# Patient Record
Sex: Male | Born: 1935
Health system: Southern US, Community
[De-identification: ages and names within clinical notes are randomized; demographics above are authoritative.]

## PROBLEM LIST (undated history)

## (undated) DIAGNOSIS — I639 Cerebral infarction, unspecified: Secondary | ICD-10-CM

## (undated) DIAGNOSIS — D649 Anemia, unspecified: Secondary | ICD-10-CM

## (undated) DIAGNOSIS — E785 Hyperlipidemia, unspecified: Secondary | ICD-10-CM

## (undated) DIAGNOSIS — Z8719 Personal history of other diseases of the digestive system: Secondary | ICD-10-CM

## (undated) DIAGNOSIS — I10 Essential (primary) hypertension: Secondary | ICD-10-CM

## (undated) DIAGNOSIS — R252 Cramp and spasm: Secondary | ICD-10-CM

## (undated) DIAGNOSIS — I351 Nonrheumatic aortic (valve) insufficiency: Secondary | ICD-10-CM

## (undated) HISTORY — DX: Hyperlipidemia, unspecified: E78.5

## (undated) HISTORY — DX: Personal history of other diseases of the digestive system: Z87.19

## (undated) HISTORY — DX: Cramp and spasm: R25.2

## (undated) HISTORY — DX: Essential (primary) hypertension: I10

## (undated) HISTORY — DX: Cerebral infarction, unspecified: I63.9

## (undated) HISTORY — DX: Nonrheumatic aortic (valve) insufficiency: I35.1

## (undated) HISTORY — DX: Anemia, unspecified: D64.9

---

## 2004-09-30 ENCOUNTER — Ambulatory Visit: Payer: Self-pay | Admitting: Gastroenterology

## 2004-10-12 ENCOUNTER — Ambulatory Visit: Payer: Self-pay | Admitting: Gastroenterology

## 2005-09-04 HISTORY — PX: CARDIOVASCULAR STRESS TEST: SHX262

## 2006-03-20 ENCOUNTER — Emergency Department (HOSPITAL_COMMUNITY): Admission: EM | Admit: 2006-03-20 | Discharge: 2006-03-20 | Payer: Self-pay | Admitting: Family Medicine

## 2007-05-27 ENCOUNTER — Ambulatory Visit: Payer: Self-pay | Admitting: Gastroenterology

## 2007-05-27 LAB — CONVERTED CEMR LAB
Folate: 8.2 ng/mL
H Pylori IgG: NEGATIVE
Iron: 73 ug/dL (ref 42–165)
Saturation Ratios: 24.1 % (ref 20.0–50.0)
Transferrin: 216.1 mg/dL (ref >=212.0)
Vitamin B-12: 349 pg/mL (ref 211–911)

## 2007-05-28 ENCOUNTER — Ambulatory Visit: Payer: Self-pay | Admitting: Gastroenterology

## 2007-05-28 ENCOUNTER — Encounter: Payer: Self-pay | Admitting: Gastroenterology

## 2007-05-28 DIAGNOSIS — K2970 Gastritis, unspecified, without bleeding: Secondary | ICD-10-CM | POA: Insufficient documentation

## 2007-05-28 DIAGNOSIS — K2980 Duodenitis without bleeding: Secondary | ICD-10-CM | POA: Insufficient documentation

## 2007-05-28 DIAGNOSIS — K2990 Gastroduodenitis, unspecified, without bleeding: Secondary | ICD-10-CM

## 2007-05-28 DIAGNOSIS — K648 Other hemorrhoids: Secondary | ICD-10-CM | POA: Insufficient documentation

## 2007-07-25 DIAGNOSIS — K219 Gastro-esophageal reflux disease without esophagitis: Secondary | ICD-10-CM | POA: Insufficient documentation

## 2007-07-25 DIAGNOSIS — K449 Diaphragmatic hernia without obstruction or gangrene: Secondary | ICD-10-CM | POA: Insufficient documentation

## 2007-07-25 DIAGNOSIS — D649 Anemia, unspecified: Secondary | ICD-10-CM | POA: Insufficient documentation

## 2007-08-21 ENCOUNTER — Inpatient Hospital Stay (HOSPITAL_BASED_OUTPATIENT_CLINIC_OR_DEPARTMENT_OTHER): Admission: RE | Admit: 2007-08-21 | Discharge: 2007-08-21 | Payer: Self-pay | Admitting: Cardiovascular Disease

## 2007-08-23 ENCOUNTER — Ambulatory Visit (HOSPITAL_COMMUNITY): Admission: RE | Admit: 2007-08-23 | Discharge: 2007-08-23 | Payer: Self-pay | Admitting: Cardiovascular Disease

## 2007-11-26 HISTORY — PX: US ECHOCARDIOGRAPHY: HXRAD669

## 2009-04-19 DIAGNOSIS — I719 Aortic aneurysm of unspecified site, without rupture: Secondary | ICD-10-CM | POA: Insufficient documentation

## 2009-04-19 DIAGNOSIS — D509 Iron deficiency anemia, unspecified: Secondary | ICD-10-CM | POA: Insufficient documentation

## 2009-04-19 DIAGNOSIS — E785 Hyperlipidemia, unspecified: Secondary | ICD-10-CM | POA: Insufficient documentation

## 2010-11-15 NOTE — Cardiovascular Report (Signed)
NAMELAVERE, STORK NO.:  [id number]   MEDICAL RECORD NO.:  [id number]          PATIENT TYPE:  OIB   LOCATION:  1963                         FACILITY:  [facility]   PHYSICIAN:  Vesta Mixer, M.D. DATE OF BIRTH:  01/07/1927   DATE OF PROCEDURE:  08/21/2007  DATE OF DISCHARGE:  08/21/2007                            CARDIAC CATHETERIZATION   Stephen Flynn is a 77 year old gentleman with a longstanding history  with hypertension.  He presented, yesterday, with 2 weeks of chest pain  and chest pressure.  He has had a stress Cardiolite study that was  negative for ischemia in the past.  He presented, yesterday, with 2  weeks of chest tightness.  He was referred for heart catheterization for  further evaluation.   PROCEDURE:  Left heart catheterization with coronary angiography with a  left ventriculogram and an aortic root aortography.   The right femoral artery was easily cannulated using a modified  Seldinger technique.  Because of the tortuosity of the aorta all the  catheters were exchanged over a guidewire.   HEMODYNAMIC RESULTS:  LV pressure was 122/13 with an aortic pressure of  122/77.   On manipulating the catheters, it became apparent that the [name]  had an extremely large aortic arch.  The left main was cannulated using  a using Judkins left thigh catheter.  The right coronary artery was best  visualized use a 5-French Amplatz left one catheter.  To perform the  aortic root shot we sized up to a 6-French catheter and used a 6-French  angulated pigtail.   CORONARY ANGIOGRAPHY:  1. The left main is smooth and normal.  2. The left anterior descending artery is fairly normal.  There is a      large first diagonal branch which is normal.  3. The left circumflex artery is very large vessel.  It is smooth and      normal.  The obtuse marginal artery #1 and the obtuse marginal      artery #2 were both normal.  The posterolateral segment artery is  normal.  4. Right coronary artery has a very anterior takeoff.  It was best      visualized using an Amplatz left one catheter.  There is a      shepherd's crook proximally; but, otherwise, the right coronary      artery is unremarkable.   LEFT VENTRICULOGRAM:  Performed in the 30-RAO position.  It reveals a  mildly dilated left ventricle.  The overall left ventricular systolic  function is normal.  The ejection fraction was around 55%.   Aortic root shot was performed using the 6-French catheter.  It reveals  a markedly dilated aortic root and aortic arch.  There is no air aortic  insufficiency.  There appears to be some aneurysmal segments within the  aortic arch.   COMPLICATIONS:  None.   CONCLUSIONS:  1. Normal coronary arteries.  2. Normal left ventricular systolic function.  3. Moderate-to-severe dilatation of the aortic root and aortic arch.      We will arrange to have a CT scan  in the next day or so.  This      could be the cause of his chest pain.  I do not see any evidence of      dissection.           ______________________________  Vesta Mixer, M.D.     PJN/MEDQ  D:  08/21/2007  T:  08/22/2007  Job:  [id number]   cc:   Gwen Pounds, MD

## 2010-11-15 NOTE — H&P (Signed)
Stephen Flynn, Stephen Flynn NO.:  [id number]   MEDICAL RECORD NO.:  [id number]          PATIENT TYPE:  OIB   LOCATION:  NA                           FACILITY:  [facility]   PHYSICIAN:  Stephen Flynn, M.D. DATE OF BIRTH:  01/07/1927   DATE OF ADMISSION:  DATE OF DISCHARGE:                              HISTORY & PHYSICAL   REASON FOR REFERRAL:  Stephen Flynn is an elderly gentleman with a  history of hypertension and chest pain.  He is referred to our office  this afternoon for recurrent chest pains.  He is now admitted for heart  catheterization because of symptoms consistent with unstable angina.   HISTORY:  Stephen Flynn is a 77 year old gentleman who has had history  of chest pain in the past.  He has had a negative stress Cardiolite  study here at our office.  For the past 2 weeks, he has had episodes of  chest pain.  This chest pain occurs at various times.  It is not  necessarily associated with eating, drinking, change in position, taking  a deep breath or exercise.  He has had some vague underlying chest pain  for the past several months.  Describes it as a heaviness or a chest  ache.  It is slow in severity and perhaps 2/10.  It lasts for 5-10  minutes and then resolves only to reoccur again several hours later.  This past weekend, he had a severe episode of pain just after eating.  The patient denies any PND or orthopnea.  He denies any syncope or  presyncope.   CURRENT MEDICATIONS:  1. Benicar/HCT 40/25 mg once a day.  2. Norvasc 5 mg a day.   ALLERGIES:  PENICILLIN.   PAST MEDICAL HISTORY:  1. Hypertension.  2. History of GI bleed with anemia.  3. History of ankle fracture.   SOCIAL HISTORY:  The patient used to smoke, but quit 35 years ago.  He  does not drink alcohol.   FAMILY HISTORY:  His father died at age 8 due to old age.  His mother  died at age 57 due to old age.   REVIEW OF SYSTEMS:  Reviewed and is essentially negative except as noted  in the HPI.   PHYSICAL EXAMINATION:  GENERAL:  He is an elderly gentleman in no acute  distress.  He is alert and oriented x3. His mood and affect are normal.  VITAL SIGNS:  His weight is 250, blood pressure is 140/90 a with heart  rate of 68.  HEENT:  Reveals 2+ carotids.  He has no bruits, no JVD, no thyromegaly.  LUNGS:  Clear to auscultation.  HEART:  Regular rate, S1-S2.  He has no murmurs.  ABDOMEN:  Reveals good bowel sounds, nontender.  EXTREMITIES:  He has no clubbing, cyanosis or edema.  NEURO:  Nonfocal.   DIAGNOSTICS:  His EKG from Dr. Ferd Flynn office reveals normal sinus  rhythm.  He has occasional premature ventricular contractions.  There  are no acute ST/T wave changes.   LABORATORY DATA:  Pending.   ASSESSMENT:  [name] presents  with symptoms that are worrisome for  unstable angina.  He has had diffuse chest achiness and dullness for the  past several weeks.  He has had a negative stress Cardiolite study 2  years ago, but I agree with Dr. Timothy Flynn that his symptoms are somewhat  worrisome.  We have discussed the risks, benefits and options of heart  catheterization.  He understands and agrees to proceed.           ______________________________  Stephen Flynn, M.D.     PJN/MEDQ  D:  08/20/2007  T:  08/21/2007  Job:  [id number]   cc:   Stephen Pounds, MD

## 2010-11-15 NOTE — Assessment & Plan Note (Signed)
[facility]                         GASTROENTEROLOGY OFFICE NOTE   NAME:Stephen Flynn, Stephen Flynn                        MRN:          [id number]  DATE:05/27/2007                            DOB:          01/07/1927    REFERRING PHYSICIAN:  Gwen Pounds, MD   REASON FOR CONSULT:  Hemoccult-positive stool and anemia.   HISTORY OF PRESENT ILLNESS:  Mr. Hengst is a 77 year old African-  American male that I have previously evaluated.  He underwent  colonoscopy in April 2006 for a family history of colon polyps.  Small  internal hemorrhoids were noted.  The remainder of the exam was normal.  He has no ongoing gastrointestinal complaints, and specifically denies  any abdominal pain, rectal pain, weight loss, change in bowel habits,  change in stool caliber, melena, hematochezia, dyspepsia, reflux  symptoms, nausea, vomiting, dysphagia or odynophagia.  He was recently  found to have a normocytic anemia with a hemoglobin of 11.8, and an MCV  of 89.  Hemoccult-positive stool was noted on digital rectal  examination, and was negative on screening home hemoccult.   PAST MEDICAL HISTORY:  1. Hypertension.  2. Hyperlipidemia.  3. Benign prostatic hypertrophy.  4. Hemorrhoids.   CURRENT MEDICATIONS:  1. Benicar 40/2.5 one q. day.  2. Norvasc 5 mg q. day.   MEDICATION ALLERGIES:  None known.   SOCIAL HISTORY/REVIEW OF SYSTEMS:  Per the handwritten form.   PHYSICAL EXAM:  Overweight, no acute distress.  Height 5 feet 11 inches, weight 250.2 pounds, blood pressure 100/70,  pulse 78 and regular.  HEENT:  Anicteric sclerae.  Oropharynx clear.  CHEST:  Clear to auscultation bilaterally.  CARDIAC:  Regular rate and rhythm without murmurs appreciated.  ABDOMEN:  Soft, nontender, nondistended.  Normoactive bowel sounds.  Nonpalpable organomegaly, masses or hernias.  RECTAL EXAMINATION:  Deferred at the time of colonoscopy.  Recent exam  was unremarkable except for  hemoccult-positive stool.  EXTREMITIES:  Without clubbing, cyanosis, edema.  NEUROLOGIC:  Alert and oriented x3.  Grossly nonfocal.   ASSESSMENT AND PLAN:  Hemoccult-positive stool and a normocytic anemia.  Obtain all standard blood tests for evaluation of anemia.  Rule out  colorectal neoplasms, arteriovenous malformations, ulcer disease,  gastritis and other disorders.  Risks, benefits, and alternatives to  colonoscopy with possible biopsy and possible polypectomy and upper  endoscopy with possible biopsy discussed with the patient, and he  consents to proceed.  The procedures will be scheduled electively.     Venita Lick. Russella Dar, MD, [facility]  Electronically Signed    MTS/MedQ  DD: 05/27/2007  DT: 05/27/2007  Job #: [id number]   cc:   Gwen Pounds, MD

## 2011-06-26 ENCOUNTER — Other Ambulatory Visit (HOSPITAL_COMMUNITY): Payer: Self-pay | Admitting: Internal Medicine

## 2011-06-26 DIAGNOSIS — I719 Aortic aneurysm of unspecified site, without rupture: Secondary | ICD-10-CM

## 2011-06-28 ENCOUNTER — Ambulatory Visit (HOSPITAL_COMMUNITY): Payer: Medicare PPO | Attending: Cardiovascular Disease | Admitting: Radiology

## 2011-06-28 DIAGNOSIS — I359 Nonrheumatic aortic valve disorder, unspecified: Secondary | ICD-10-CM | POA: Insufficient documentation

## 2011-06-28 DIAGNOSIS — I059 Rheumatic mitral valve disease, unspecified: Secondary | ICD-10-CM | POA: Insufficient documentation

## 2011-06-28 DIAGNOSIS — I10 Essential (primary) hypertension: Secondary | ICD-10-CM | POA: Insufficient documentation

## 2011-06-28 DIAGNOSIS — I719 Aortic aneurysm of unspecified site, without rupture: Secondary | ICD-10-CM | POA: Insufficient documentation

## 2011-06-29 ENCOUNTER — Encounter (HOSPITAL_COMMUNITY): Payer: Self-pay | Admitting: Internal Medicine

## 2011-11-07 ENCOUNTER — Other Ambulatory Visit: Payer: Self-pay | Admitting: *Deleted

## 2011-11-07 ENCOUNTER — Encounter: Payer: Self-pay | Admitting: *Deleted

## 2012-04-11 DIAGNOSIS — Z2839 Other underimmunization status: Secondary | ICD-10-CM | POA: Insufficient documentation

## 2012-07-11 DIAGNOSIS — L723 Sebaceous cyst: Secondary | ICD-10-CM | POA: Insufficient documentation

## 2013-01-13 DIAGNOSIS — E119 Type 2 diabetes mellitus without complications: Secondary | ICD-10-CM | POA: Insufficient documentation

## 2013-01-20 ENCOUNTER — Other Ambulatory Visit: Payer: Self-pay | Admitting: Internal Medicine

## 2013-01-20 DIAGNOSIS — D649 Anemia, unspecified: Secondary | ICD-10-CM

## 2013-01-27 ENCOUNTER — Other Ambulatory Visit: Payer: 59

## 2013-01-29 ENCOUNTER — Ambulatory Visit
Admission: RE | Admit: 2013-01-29 | Discharge: 2013-01-29 | Disposition: A | Discharge status: Home / Self Care | Payer: Medicare Other | Source: Ambulatory Visit | Attending: Internal Medicine | Admitting: Internal Medicine

## 2013-01-29 DIAGNOSIS — D649 Anemia, unspecified: Secondary | ICD-10-CM

## 2013-07-30 ENCOUNTER — Encounter: Payer: Self-pay | Admitting: Cardiology

## 2013-07-30 ENCOUNTER — Ambulatory Visit (HOSPITAL_COMMUNITY): Payer: Medicare Other | Attending: Cardiology | Admitting: Radiology

## 2013-07-30 ENCOUNTER — Other Ambulatory Visit: Payer: Self-pay

## 2013-07-30 ENCOUNTER — Other Ambulatory Visit (HOSPITAL_COMMUNITY): Payer: Self-pay | Admitting: Internal Medicine

## 2013-07-30 DIAGNOSIS — I059 Rheumatic mitral valve disease, unspecified: Secondary | ICD-10-CM | POA: Insufficient documentation

## 2013-07-30 DIAGNOSIS — I719 Aortic aneurysm of unspecified site, without rupture: Secondary | ICD-10-CM

## 2013-07-30 DIAGNOSIS — I10 Essential (primary) hypertension: Secondary | ICD-10-CM | POA: Insufficient documentation

## 2013-07-30 DIAGNOSIS — I2789 Other specified pulmonary heart diseases: Secondary | ICD-10-CM | POA: Insufficient documentation

## 2013-07-30 NOTE — Progress Notes (Signed)
Echocardiogram performed.  

## 2013-07-31 DIAGNOSIS — I2721 Secondary pulmonary arterial hypertension: Secondary | ICD-10-CM | POA: Insufficient documentation

## 2014-07-13 DIAGNOSIS — I10 Essential (primary) hypertension: Secondary | ICD-10-CM | POA: Diagnosis not present

## 2014-07-13 DIAGNOSIS — E785 Hyperlipidemia, unspecified: Secondary | ICD-10-CM | POA: Diagnosis not present

## 2014-07-13 DIAGNOSIS — E119 Type 2 diabetes mellitus without complications: Secondary | ICD-10-CM | POA: Diagnosis not present

## 2014-07-13 DIAGNOSIS — Z008 Encounter for other general examination: Secondary | ICD-10-CM | POA: Diagnosis not present

## 2014-07-13 DIAGNOSIS — Z125 Encounter for screening for malignant neoplasm of prostate: Secondary | ICD-10-CM | POA: Diagnosis not present

## 2014-07-21 ENCOUNTER — Other Ambulatory Visit (HOSPITAL_COMMUNITY): Payer: Self-pay | Admitting: Internal Medicine

## 2014-07-21 DIAGNOSIS — I719 Aortic aneurysm of unspecified site, without rupture: Secondary | ICD-10-CM

## 2014-07-27 ENCOUNTER — Inpatient Hospital Stay (HOSPITAL_COMMUNITY): Admission: RE | Admit: 2014-07-27 | Payer: Self-pay | Source: Ambulatory Visit

## 2014-08-04 ENCOUNTER — Ambulatory Visit (HOSPITAL_COMMUNITY)
Admission: RE | Admit: 2014-08-04 | Discharge: 2014-08-04 | Disposition: A | Discharge status: Home / Self Care | Payer: Medicare HMO | Source: Ambulatory Visit | Attending: Cardiovascular Disease | Admitting: Cardiovascular Disease

## 2014-08-04 DIAGNOSIS — I719 Aortic aneurysm of unspecified site, without rupture: Secondary | ICD-10-CM

## 2014-08-04 DIAGNOSIS — I10 Essential (primary) hypertension: Secondary | ICD-10-CM | POA: Diagnosis not present

## 2014-08-04 NOTE — Progress Notes (Signed)
2D Echocardiogram Complete.  08/04/2014   Dreya Buhrman, RDCS  

## 2014-08-06 DIAGNOSIS — Z1212 Encounter for screening for malignant neoplasm of rectum: Secondary | ICD-10-CM | POA: Diagnosis not present

## 2014-10-19 DIAGNOSIS — R972 Elevated prostate specific antigen [PSA]: Secondary | ICD-10-CM | POA: Diagnosis not present

## 2014-10-26 DIAGNOSIS — R972 Elevated prostate specific antigen [PSA]: Secondary | ICD-10-CM | POA: Diagnosis not present

## 2015-01-25 DIAGNOSIS — E785 Hyperlipidemia, unspecified: Secondary | ICD-10-CM | POA: Diagnosis not present

## 2015-01-25 DIAGNOSIS — I272 Other secondary pulmonary hypertension: Secondary | ICD-10-CM | POA: Diagnosis not present

## 2015-01-25 DIAGNOSIS — D509 Iron deficiency anemia, unspecified: Secondary | ICD-10-CM | POA: Diagnosis not present

## 2015-01-25 DIAGNOSIS — I719 Aortic aneurysm of unspecified site, without rupture: Secondary | ICD-10-CM | POA: Diagnosis not present

## 2015-01-25 DIAGNOSIS — R972 Elevated prostate specific antigen [PSA]: Secondary | ICD-10-CM | POA: Diagnosis not present

## 2015-01-25 DIAGNOSIS — I10 Essential (primary) hypertension: Secondary | ICD-10-CM | POA: Diagnosis not present

## 2015-01-25 DIAGNOSIS — E119 Type 2 diabetes mellitus without complications: Secondary | ICD-10-CM | POA: Diagnosis not present

## 2015-04-21 DIAGNOSIS — Z23 Encounter for immunization: Secondary | ICD-10-CM | POA: Diagnosis not present

## 2015-07-19 DIAGNOSIS — I10 Essential (primary) hypertension: Secondary | ICD-10-CM | POA: Diagnosis not present

## 2015-07-19 DIAGNOSIS — E784 Other hyperlipidemia: Secondary | ICD-10-CM | POA: Diagnosis not present

## 2015-07-19 DIAGNOSIS — E119 Type 2 diabetes mellitus without complications: Secondary | ICD-10-CM | POA: Diagnosis not present

## 2015-07-19 DIAGNOSIS — Z125 Encounter for screening for malignant neoplasm of prostate: Secondary | ICD-10-CM | POA: Diagnosis not present

## 2015-07-26 DIAGNOSIS — D508 Other iron deficiency anemias: Secondary | ICD-10-CM | POA: Diagnosis not present

## 2015-07-26 DIAGNOSIS — Z0000 Encounter for general adult medical examination without abnormal findings: Secondary | ICD-10-CM | POA: Diagnosis not present

## 2015-07-26 DIAGNOSIS — Z1389 Encounter for screening for other disorder: Secondary | ICD-10-CM | POA: Diagnosis not present

## 2015-07-26 DIAGNOSIS — N401 Enlarged prostate with lower urinary tract symptoms: Secondary | ICD-10-CM | POA: Diagnosis not present

## 2015-07-26 DIAGNOSIS — I272 Other secondary pulmonary hypertension: Secondary | ICD-10-CM | POA: Diagnosis not present

## 2015-07-26 DIAGNOSIS — E119 Type 2 diabetes mellitus without complications: Secondary | ICD-10-CM | POA: Diagnosis not present

## 2015-07-26 DIAGNOSIS — I719 Aortic aneurysm of unspecified site, without rupture: Secondary | ICD-10-CM | POA: Diagnosis not present

## 2015-07-26 DIAGNOSIS — R972 Elevated prostate specific antigen [PSA]: Secondary | ICD-10-CM | POA: Diagnosis not present

## 2015-08-13 DIAGNOSIS — Z0000 Encounter for general adult medical examination without abnormal findings: Secondary | ICD-10-CM | POA: Diagnosis not present

## 2015-08-13 DIAGNOSIS — R972 Elevated prostate specific antigen [PSA]: Secondary | ICD-10-CM | POA: Diagnosis not present

## 2015-09-10 DIAGNOSIS — Z0000 Encounter for general adult medical examination without abnormal findings: Secondary | ICD-10-CM | POA: Diagnosis not present

## 2015-10-04 DIAGNOSIS — Z0000 Encounter for general adult medical examination without abnormal findings: Secondary | ICD-10-CM | POA: Diagnosis not present

## 2015-10-19 DIAGNOSIS — R972 Elevated prostate specific antigen [PSA]: Secondary | ICD-10-CM | POA: Diagnosis not present

## 2015-11-01 DIAGNOSIS — N400 Enlarged prostate without lower urinary tract symptoms: Secondary | ICD-10-CM | POA: Diagnosis not present

## 2015-11-01 DIAGNOSIS — C61 Malignant neoplasm of prostate: Secondary | ICD-10-CM | POA: Diagnosis not present

## 2015-11-01 DIAGNOSIS — Z0000 Encounter for general adult medical examination without abnormal findings: Secondary | ICD-10-CM | POA: Diagnosis not present

## 2016-01-25 DIAGNOSIS — C61 Malignant neoplasm of prostate: Secondary | ICD-10-CM | POA: Insufficient documentation

## 2016-01-25 DIAGNOSIS — R972 Elevated prostate specific antigen [PSA]: Secondary | ICD-10-CM | POA: Diagnosis not present

## 2016-01-25 DIAGNOSIS — I10 Essential (primary) hypertension: Secondary | ICD-10-CM | POA: Diagnosis not present

## 2016-01-25 DIAGNOSIS — K029 Dental caries, unspecified: Secondary | ICD-10-CM | POA: Diagnosis not present

## 2016-01-25 DIAGNOSIS — E119 Type 2 diabetes mellitus without complications: Secondary | ICD-10-CM | POA: Diagnosis not present

## 2016-01-25 DIAGNOSIS — D649 Anemia, unspecified: Secondary | ICD-10-CM | POA: Diagnosis not present

## 2016-01-25 DIAGNOSIS — N401 Enlarged prostate with lower urinary tract symptoms: Secondary | ICD-10-CM | POA: Diagnosis not present

## 2016-01-25 DIAGNOSIS — D508 Other iron deficiency anemias: Secondary | ICD-10-CM | POA: Diagnosis not present

## 2016-01-25 DIAGNOSIS — I272 Other secondary pulmonary hypertension: Secondary | ICD-10-CM | POA: Diagnosis not present

## 2016-03-31 DIAGNOSIS — I10 Essential (primary) hypertension: Secondary | ICD-10-CM | POA: Diagnosis not present

## 2016-03-31 DIAGNOSIS — I509 Heart failure, unspecified: Secondary | ICD-10-CM | POA: Diagnosis not present

## 2016-03-31 DIAGNOSIS — I5189 Other ill-defined heart diseases: Secondary | ICD-10-CM | POA: Diagnosis not present

## 2016-04-04 DIAGNOSIS — I5189 Other ill-defined heart diseases: Secondary | ICD-10-CM | POA: Diagnosis not present

## 2016-04-04 DIAGNOSIS — Z6835 Body mass index (BMI) 35.0-35.9, adult: Secondary | ICD-10-CM | POA: Diagnosis not present

## 2016-04-04 DIAGNOSIS — I509 Heart failure, unspecified: Secondary | ICD-10-CM | POA: Diagnosis not present

## 2016-04-04 DIAGNOSIS — I10 Essential (primary) hypertension: Secondary | ICD-10-CM | POA: Diagnosis not present

## 2016-04-20 ENCOUNTER — Other Ambulatory Visit: Payer: Self-pay | Admitting: Internal Medicine

## 2016-04-20 ENCOUNTER — Ambulatory Visit (HOSPITAL_COMMUNITY): Payer: Commercial Managed Care - HMO | Attending: Cardiovascular Disease

## 2016-04-20 ENCOUNTER — Encounter (INDEPENDENT_AMBULATORY_CARE_PROVIDER_SITE_OTHER): Payer: Self-pay

## 2016-04-20 ENCOUNTER — Other Ambulatory Visit: Payer: Self-pay

## 2016-04-20 DIAGNOSIS — I5033 Acute on chronic diastolic (congestive) heart failure: Secondary | ICD-10-CM | POA: Diagnosis not present

## 2016-04-20 DIAGNOSIS — I10 Essential (primary) hypertension: Secondary | ICD-10-CM | POA: Diagnosis not present

## 2016-04-20 DIAGNOSIS — I519 Heart disease, unspecified: Secondary | ICD-10-CM | POA: Diagnosis not present

## 2016-04-20 DIAGNOSIS — I5189 Other ill-defined heart diseases: Secondary | ICD-10-CM

## 2016-05-02 DIAGNOSIS — N400 Enlarged prostate without lower urinary tract symptoms: Secondary | ICD-10-CM | POA: Diagnosis not present

## 2016-05-02 DIAGNOSIS — Z23 Encounter for immunization: Secondary | ICD-10-CM | POA: Diagnosis not present

## 2016-05-09 DIAGNOSIS — N400 Enlarged prostate without lower urinary tract symptoms: Secondary | ICD-10-CM | POA: Diagnosis not present

## 2016-05-09 DIAGNOSIS — C61 Malignant neoplasm of prostate: Secondary | ICD-10-CM | POA: Diagnosis not present

## 2016-07-21 DIAGNOSIS — R8299 Other abnormal findings in urine: Secondary | ICD-10-CM | POA: Diagnosis not present

## 2016-07-21 DIAGNOSIS — Z125 Encounter for screening for malignant neoplasm of prostate: Secondary | ICD-10-CM | POA: Diagnosis not present

## 2016-07-21 DIAGNOSIS — E784 Other hyperlipidemia: Secondary | ICD-10-CM | POA: Diagnosis not present

## 2016-07-21 DIAGNOSIS — E119 Type 2 diabetes mellitus without complications: Secondary | ICD-10-CM | POA: Diagnosis not present

## 2016-07-21 DIAGNOSIS — I10 Essential (primary) hypertension: Secondary | ICD-10-CM | POA: Diagnosis not present

## 2016-07-27 DIAGNOSIS — I5189 Other ill-defined heart diseases: Secondary | ICD-10-CM | POA: Diagnosis not present

## 2016-07-27 DIAGNOSIS — Z0000 Encounter for general adult medical examination without abnormal findings: Secondary | ICD-10-CM | POA: Diagnosis not present

## 2016-07-27 DIAGNOSIS — E784 Other hyperlipidemia: Secondary | ICD-10-CM | POA: Diagnosis not present

## 2016-07-27 DIAGNOSIS — K5900 Constipation, unspecified: Secondary | ICD-10-CM | POA: Insufficient documentation

## 2016-07-27 DIAGNOSIS — I77819 Aortic ectasia, unspecified site: Secondary | ICD-10-CM | POA: Diagnosis not present

## 2016-07-27 DIAGNOSIS — I2789 Other specified pulmonary heart diseases: Secondary | ICD-10-CM | POA: Diagnosis not present

## 2016-07-27 DIAGNOSIS — E119 Type 2 diabetes mellitus without complications: Secondary | ICD-10-CM | POA: Diagnosis not present

## 2016-07-27 DIAGNOSIS — I718 Aortic aneurysm of unspecified site, ruptured: Secondary | ICD-10-CM | POA: Diagnosis not present

## 2016-07-27 DIAGNOSIS — C61 Malignant neoplasm of prostate: Secondary | ICD-10-CM | POA: Diagnosis not present

## 2016-08-03 DIAGNOSIS — H5213 Myopia, bilateral: Secondary | ICD-10-CM | POA: Diagnosis not present

## 2016-11-28 DIAGNOSIS — C61 Malignant neoplasm of prostate: Secondary | ICD-10-CM | POA: Diagnosis not present

## 2016-12-11 DIAGNOSIS — N400 Enlarged prostate without lower urinary tract symptoms: Secondary | ICD-10-CM | POA: Diagnosis not present

## 2016-12-11 DIAGNOSIS — C61 Malignant neoplasm of prostate: Secondary | ICD-10-CM | POA: Diagnosis not present

## 2017-02-01 DIAGNOSIS — I718 Aortic aneurysm of unspecified site, ruptured: Secondary | ICD-10-CM | POA: Diagnosis not present

## 2017-02-01 DIAGNOSIS — I2789 Other specified pulmonary heart diseases: Secondary | ICD-10-CM | POA: Diagnosis not present

## 2017-02-01 DIAGNOSIS — I77819 Aortic ectasia, unspecified site: Secondary | ICD-10-CM | POA: Diagnosis not present

## 2017-02-01 DIAGNOSIS — I5189 Other ill-defined heart diseases: Secondary | ICD-10-CM | POA: Diagnosis not present

## 2017-02-01 DIAGNOSIS — E119 Type 2 diabetes mellitus without complications: Secondary | ICD-10-CM | POA: Diagnosis not present

## 2017-02-01 DIAGNOSIS — C61 Malignant neoplasm of prostate: Secondary | ICD-10-CM | POA: Diagnosis not present

## 2017-02-01 DIAGNOSIS — K5909 Other constipation: Secondary | ICD-10-CM | POA: Diagnosis not present

## 2017-02-01 DIAGNOSIS — I10 Essential (primary) hypertension: Secondary | ICD-10-CM | POA: Diagnosis not present

## 2017-03-13 DIAGNOSIS — H2513 Age-related nuclear cataract, bilateral: Secondary | ICD-10-CM | POA: Diagnosis not present

## 2017-03-13 DIAGNOSIS — H524 Presbyopia: Secondary | ICD-10-CM | POA: Diagnosis not present

## 2017-03-30 DIAGNOSIS — Z23 Encounter for immunization: Secondary | ICD-10-CM | POA: Diagnosis not present

## 2017-06-04 DIAGNOSIS — C61 Malignant neoplasm of prostate: Secondary | ICD-10-CM | POA: Diagnosis not present

## 2017-07-11 DIAGNOSIS — N400 Enlarged prostate without lower urinary tract symptoms: Secondary | ICD-10-CM | POA: Diagnosis not present

## 2017-07-11 DIAGNOSIS — C61 Malignant neoplasm of prostate: Secondary | ICD-10-CM | POA: Diagnosis not present

## 2017-07-16 NOTE — Congregational Nurse Program (Signed)
Congregational Nurse Program Note  Date of Encounter: 07/16/2017  Past Medical History: Past Medical History:  Diagnosis Date  . Anemia   . Aortic insufficiency   . Chest pain   . H/O: GI bleed   . Hyperlipidemia   . Hypertension   . Leg cramps     Encounter Details: CNP Questionnaire - 07/16/17 1545      Questionnaire   Patient Status  Not Applicable    Race  Black or African American    Location Patient Served At  [employer]  No food insecurities    Housing/Utilities  Yes, have permanent housing    Transportation  No transportation needs    Interpersonal Safety  Yes, feel physically and emotionally safe where you currently live    Medication  No medication insecurities    Medical Provider  Yes    Referrals  Not Applicable    ED Visit Averted  Not Applicable    Life-Saving Intervention Made  Not Applicable      Client had questions about lab results I was able to look up results and tell him normal value and what the results meant. He was very thankful for help. [address] Zacher PhD,RN,BSN,BC,CN [phone].

## 2017-07-24 DIAGNOSIS — Z125 Encounter for screening for malignant neoplasm of prostate: Secondary | ICD-10-CM | POA: Diagnosis not present

## 2017-07-24 DIAGNOSIS — I10 Essential (primary) hypertension: Secondary | ICD-10-CM | POA: Diagnosis not present

## 2017-07-24 DIAGNOSIS — R82998 Other abnormal findings in urine: Secondary | ICD-10-CM | POA: Diagnosis not present

## 2017-07-24 DIAGNOSIS — E119 Type 2 diabetes mellitus without complications: Secondary | ICD-10-CM | POA: Diagnosis not present

## 2017-07-24 DIAGNOSIS — E7849 Other hyperlipidemia: Secondary | ICD-10-CM | POA: Diagnosis not present

## 2017-07-31 DIAGNOSIS — E119 Type 2 diabetes mellitus without complications: Secondary | ICD-10-CM | POA: Diagnosis not present

## 2017-07-31 DIAGNOSIS — I77819 Aortic ectasia, unspecified site: Secondary | ICD-10-CM | POA: Diagnosis not present

## 2017-07-31 DIAGNOSIS — I719 Aortic aneurysm of unspecified site, without rupture: Secondary | ICD-10-CM | POA: Diagnosis not present

## 2017-07-31 DIAGNOSIS — Z0000 Encounter for general adult medical examination without abnormal findings: Secondary | ICD-10-CM | POA: Diagnosis not present

## 2017-07-31 DIAGNOSIS — K5909 Other constipation: Secondary | ICD-10-CM | POA: Diagnosis not present

## 2017-07-31 DIAGNOSIS — C61 Malignant neoplasm of prostate: Secondary | ICD-10-CM | POA: Diagnosis not present

## 2017-07-31 DIAGNOSIS — I5189 Other ill-defined heart diseases: Secondary | ICD-10-CM | POA: Diagnosis not present

## 2017-07-31 DIAGNOSIS — I2721 Secondary pulmonary arterial hypertension: Secondary | ICD-10-CM | POA: Diagnosis not present

## 2017-08-09 ENCOUNTER — Other Ambulatory Visit: Payer: Self-pay | Admitting: Internal Medicine

## 2017-08-09 DIAGNOSIS — I719 Aortic aneurysm of unspecified site, without rupture: Secondary | ICD-10-CM

## 2017-08-09 DIAGNOSIS — I77819 Aortic ectasia, unspecified site: Secondary | ICD-10-CM

## 2017-08-09 DIAGNOSIS — I5189 Other ill-defined heart diseases: Secondary | ICD-10-CM

## 2017-08-14 ENCOUNTER — Other Ambulatory Visit: Payer: Self-pay

## 2017-08-14 ENCOUNTER — Ambulatory Visit (HOSPITAL_COMMUNITY): Payer: Medicare HMO | Attending: Cardiology

## 2017-08-14 DIAGNOSIS — I77819 Aortic ectasia, unspecified site: Secondary | ICD-10-CM

## 2017-08-14 DIAGNOSIS — I719 Aortic aneurysm of unspecified site, without rupture: Secondary | ICD-10-CM | POA: Diagnosis not present

## 2017-08-14 DIAGNOSIS — I5189 Other ill-defined heart diseases: Secondary | ICD-10-CM

## 2017-08-14 DIAGNOSIS — I083 Combined rheumatic disorders of mitral, aortic and tricuspid valves: Secondary | ICD-10-CM | POA: Diagnosis not present

## 2017-10-24 DIAGNOSIS — R42 Dizziness and giddiness: Secondary | ICD-10-CM | POA: Insufficient documentation

## 2017-10-24 DIAGNOSIS — I10 Essential (primary) hypertension: Secondary | ICD-10-CM | POA: Diagnosis not present

## 2017-10-24 DIAGNOSIS — E119 Type 2 diabetes mellitus without complications: Secondary | ICD-10-CM | POA: Diagnosis not present

## 2017-10-31 DIAGNOSIS — I10 Essential (primary) hypertension: Secondary | ICD-10-CM | POA: Diagnosis not present

## 2017-10-31 DIAGNOSIS — R42 Dizziness and giddiness: Secondary | ICD-10-CM | POA: Diagnosis not present

## 2017-10-31 DIAGNOSIS — I5189 Other ill-defined heart diseases: Secondary | ICD-10-CM | POA: Diagnosis not present

## 2017-10-31 DIAGNOSIS — Z6834 Body mass index (BMI) 34.0-34.9, adult: Secondary | ICD-10-CM | POA: Diagnosis not present

## 2017-10-31 DIAGNOSIS — D509 Iron deficiency anemia, unspecified: Secondary | ICD-10-CM | POA: Diagnosis not present

## 2017-11-06 DIAGNOSIS — I10 Essential (primary) hypertension: Secondary | ICD-10-CM | POA: Diagnosis not present

## 2017-11-12 DIAGNOSIS — I10 Essential (primary) hypertension: Secondary | ICD-10-CM | POA: Diagnosis not present

## 2017-12-11 DIAGNOSIS — R42 Dizziness and giddiness: Secondary | ICD-10-CM | POA: Diagnosis not present

## 2017-12-11 DIAGNOSIS — Z6835 Body mass index (BMI) 35.0-35.9, adult: Secondary | ICD-10-CM | POA: Diagnosis not present

## 2017-12-11 DIAGNOSIS — I10 Essential (primary) hypertension: Secondary | ICD-10-CM | POA: Diagnosis not present

## 2018-02-05 DIAGNOSIS — K5909 Other constipation: Secondary | ICD-10-CM | POA: Diagnosis not present

## 2018-02-05 DIAGNOSIS — I77819 Aortic ectasia, unspecified site: Secondary | ICD-10-CM | POA: Diagnosis not present

## 2018-02-05 DIAGNOSIS — C61 Malignant neoplasm of prostate: Secondary | ICD-10-CM | POA: Diagnosis not present

## 2018-02-05 DIAGNOSIS — I719 Aortic aneurysm of unspecified site, without rupture: Secondary | ICD-10-CM | POA: Diagnosis not present

## 2018-02-05 DIAGNOSIS — I2721 Secondary pulmonary arterial hypertension: Secondary | ICD-10-CM | POA: Diagnosis not present

## 2018-02-05 DIAGNOSIS — E119 Type 2 diabetes mellitus without complications: Secondary | ICD-10-CM | POA: Diagnosis not present

## 2018-02-05 DIAGNOSIS — R42 Dizziness and giddiness: Secondary | ICD-10-CM | POA: Diagnosis not present

## 2018-02-05 DIAGNOSIS — I5189 Other ill-defined heart diseases: Secondary | ICD-10-CM | POA: Diagnosis not present

## 2018-03-01 DIAGNOSIS — C61 Malignant neoplasm of prostate: Secondary | ICD-10-CM | POA: Diagnosis not present

## 2018-03-08 DIAGNOSIS — N401 Enlarged prostate with lower urinary tract symptoms: Secondary | ICD-10-CM | POA: Diagnosis not present

## 2018-03-08 DIAGNOSIS — C61 Malignant neoplasm of prostate: Secondary | ICD-10-CM | POA: Diagnosis not present

## 2018-03-08 DIAGNOSIS — R351 Nocturia: Secondary | ICD-10-CM | POA: Diagnosis not present

## 2018-07-29 DIAGNOSIS — E119 Type 2 diabetes mellitus without complications: Secondary | ICD-10-CM | POA: Diagnosis not present

## 2018-07-29 DIAGNOSIS — I10 Essential (primary) hypertension: Secondary | ICD-10-CM | POA: Diagnosis not present

## 2018-07-29 DIAGNOSIS — R82998 Other abnormal findings in urine: Secondary | ICD-10-CM | POA: Diagnosis not present

## 2018-07-29 DIAGNOSIS — Z125 Encounter for screening for malignant neoplasm of prostate: Secondary | ICD-10-CM | POA: Diagnosis not present

## 2018-07-29 DIAGNOSIS — E7849 Other hyperlipidemia: Secondary | ICD-10-CM | POA: Diagnosis not present

## 2018-08-05 DIAGNOSIS — I2721 Secondary pulmonary arterial hypertension: Secondary | ICD-10-CM | POA: Diagnosis not present

## 2018-08-05 DIAGNOSIS — H269 Unspecified cataract: Secondary | ICD-10-CM | POA: Insufficient documentation

## 2018-08-05 DIAGNOSIS — Z0000 Encounter for general adult medical examination without abnormal findings: Secondary | ICD-10-CM | POA: Diagnosis not present

## 2018-08-05 DIAGNOSIS — I719 Aortic aneurysm of unspecified site, without rupture: Secondary | ICD-10-CM | POA: Diagnosis not present

## 2018-08-05 DIAGNOSIS — I77819 Aortic ectasia, unspecified site: Secondary | ICD-10-CM | POA: Diagnosis not present

## 2018-08-05 DIAGNOSIS — E119 Type 2 diabetes mellitus without complications: Secondary | ICD-10-CM | POA: Diagnosis not present

## 2018-08-05 DIAGNOSIS — Z1339 Encounter for screening examination for other mental health and behavioral disorders: Secondary | ICD-10-CM | POA: Diagnosis not present

## 2018-08-05 DIAGNOSIS — I5189 Other ill-defined heart diseases: Secondary | ICD-10-CM | POA: Diagnosis not present

## 2018-08-05 DIAGNOSIS — R42 Dizziness and giddiness: Secondary | ICD-10-CM | POA: Diagnosis not present

## 2018-08-05 DIAGNOSIS — Z1331 Encounter for screening for depression: Secondary | ICD-10-CM | POA: Diagnosis not present

## 2018-08-08 DIAGNOSIS — R9721 Rising PSA following treatment for malignant neoplasm of prostate: Secondary | ICD-10-CM | POA: Diagnosis not present

## 2018-08-08 DIAGNOSIS — C61 Malignant neoplasm of prostate: Secondary | ICD-10-CM | POA: Diagnosis not present

## 2018-08-09 ENCOUNTER — Other Ambulatory Visit: Payer: Self-pay | Admitting: Urology

## 2018-08-09 ENCOUNTER — Other Ambulatory Visit (HOSPITAL_COMMUNITY): Payer: Self-pay | Admitting: Urology

## 2018-08-09 DIAGNOSIS — Z1212 Encounter for screening for malignant neoplasm of rectum: Secondary | ICD-10-CM | POA: Diagnosis not present

## 2018-08-09 DIAGNOSIS — C61 Malignant neoplasm of prostate: Secondary | ICD-10-CM

## 2018-08-15 DIAGNOSIS — H2513 Age-related nuclear cataract, bilateral: Secondary | ICD-10-CM | POA: Diagnosis not present

## 2018-08-15 DIAGNOSIS — D23122 Other benign neoplasm of skin of left lower eyelid, including canthus: Secondary | ICD-10-CM | POA: Diagnosis not present

## 2018-08-28 ENCOUNTER — Encounter (HOSPITAL_COMMUNITY)
Admission: RE | Admit: 2018-08-28 | Discharge: 2018-08-28 | Disposition: A | Discharge status: Home / Self Care | Payer: Medicare PPO | Source: Ambulatory Visit | Attending: Urology | Admitting: Urology

## 2018-08-28 DIAGNOSIS — R972 Elevated prostate specific antigen [PSA]: Secondary | ICD-10-CM | POA: Diagnosis not present

## 2018-08-28 DIAGNOSIS — C61 Malignant neoplasm of prostate: Secondary | ICD-10-CM | POA: Insufficient documentation

## 2018-08-28 MED ORDER — TECHNETIUM TC 99M MEDRONATE IV KIT
21.2000 | PACK | Freq: Once | INTRAVENOUS | Status: AC | PRN
  Administered 2018-08-28: 21.2 via INTRAVENOUS

## 2018-09-02 DIAGNOSIS — D23122 Other benign neoplasm of skin of left lower eyelid, including canthus: Secondary | ICD-10-CM | POA: Diagnosis not present

## 2018-10-11 DIAGNOSIS — N400 Enlarged prostate without lower urinary tract symptoms: Secondary | ICD-10-CM | POA: Diagnosis not present

## 2018-10-11 DIAGNOSIS — E785 Hyperlipidemia, unspecified: Secondary | ICD-10-CM | POA: Diagnosis not present

## 2018-10-11 DIAGNOSIS — I10 Essential (primary) hypertension: Secondary | ICD-10-CM | POA: Diagnosis not present

## 2018-11-06 DIAGNOSIS — C61 Malignant neoplasm of prostate: Secondary | ICD-10-CM | POA: Diagnosis not present

## 2019-02-04 DIAGNOSIS — E119 Type 2 diabetes mellitus without complications: Secondary | ICD-10-CM | POA: Diagnosis not present

## 2019-02-04 DIAGNOSIS — I719 Aortic aneurysm of unspecified site, without rupture: Secondary | ICD-10-CM | POA: Diagnosis not present

## 2019-02-04 DIAGNOSIS — C61 Malignant neoplasm of prostate: Secondary | ICD-10-CM | POA: Diagnosis not present

## 2019-02-04 DIAGNOSIS — R42 Dizziness and giddiness: Secondary | ICD-10-CM | POA: Diagnosis not present

## 2019-02-04 DIAGNOSIS — I10 Essential (primary) hypertension: Secondary | ICD-10-CM | POA: Diagnosis not present

## 2019-02-04 DIAGNOSIS — D509 Iron deficiency anemia, unspecified: Secondary | ICD-10-CM | POA: Diagnosis not present

## 2019-02-04 DIAGNOSIS — H269 Unspecified cataract: Secondary | ICD-10-CM | POA: Diagnosis not present

## 2019-02-04 DIAGNOSIS — I5189 Other ill-defined heart diseases: Secondary | ICD-10-CM | POA: Diagnosis not present

## 2019-02-05 DIAGNOSIS — E119 Type 2 diabetes mellitus without complications: Secondary | ICD-10-CM | POA: Diagnosis not present

## 2019-03-15 DIAGNOSIS — Z23 Encounter for immunization: Secondary | ICD-10-CM | POA: Diagnosis not present

## 2019-03-21 ENCOUNTER — Emergency Department (HOSPITAL_COMMUNITY): Payer: Medicare PPO

## 2019-03-21 ENCOUNTER — Encounter (HOSPITAL_COMMUNITY): Payer: Self-pay | Admitting: Emergency Medicine

## 2019-03-21 ENCOUNTER — Inpatient Hospital Stay (HOSPITAL_COMMUNITY)
Admission: EM | Admit: 2019-03-21 | Discharge: 2019-03-23 | DRG: 065 | Disposition: A | Discharge status: Home / Self Care | Payer: Medicare PPO | Attending: Internal Medicine | Admitting: Internal Medicine

## 2019-03-21 ENCOUNTER — Inpatient Hospital Stay (HOSPITAL_COMMUNITY): Payer: Medicare PPO

## 2019-03-21 ENCOUNTER — Other Ambulatory Visit: Payer: Self-pay

## 2019-03-21 DIAGNOSIS — G8191 Hemiplegia, unspecified affecting right dominant side: Secondary | ICD-10-CM | POA: Diagnosis present

## 2019-03-21 DIAGNOSIS — Z880 Allergy status to penicillin: Secondary | ICD-10-CM

## 2019-03-21 DIAGNOSIS — R402142 Coma scale, eyes open, spontaneous, at arrival to emergency department: Secondary | ICD-10-CM | POA: Diagnosis present

## 2019-03-21 DIAGNOSIS — Z6834 Body mass index (BMI) 34.0-34.9, adult: Secondary | ICD-10-CM

## 2019-03-21 DIAGNOSIS — R402252 Coma scale, best verbal response, oriented, at arrival to emergency department: Secondary | ICD-10-CM | POA: Diagnosis present

## 2019-03-21 DIAGNOSIS — Z87891 Personal history of nicotine dependence: Secondary | ICD-10-CM

## 2019-03-21 DIAGNOSIS — R260 Ataxic gait: Secondary | ICD-10-CM | POA: Diagnosis present

## 2019-03-21 DIAGNOSIS — R29810 Facial weakness: Secondary | ICD-10-CM | POA: Diagnosis present

## 2019-03-21 DIAGNOSIS — R402362 Coma scale, best motor response, obeys commands, at arrival to emergency department: Secondary | ICD-10-CM | POA: Diagnosis present

## 2019-03-21 DIAGNOSIS — Z7982 Long term (current) use of aspirin: Secondary | ICD-10-CM

## 2019-03-21 DIAGNOSIS — E785 Hyperlipidemia, unspecified: Secondary | ICD-10-CM | POA: Diagnosis present

## 2019-03-21 DIAGNOSIS — I639 Cerebral infarction, unspecified: Secondary | ICD-10-CM | POA: Diagnosis not present

## 2019-03-21 DIAGNOSIS — R4701 Aphasia: Secondary | ICD-10-CM | POA: Diagnosis not present

## 2019-03-21 DIAGNOSIS — L989 Disorder of the skin and subcutaneous tissue, unspecified: Secondary | ICD-10-CM | POA: Diagnosis present

## 2019-03-21 DIAGNOSIS — N400 Enlarged prostate without lower urinary tract symptoms: Secondary | ICD-10-CM

## 2019-03-21 DIAGNOSIS — I351 Nonrheumatic aortic (valve) insufficiency: Secondary | ICD-10-CM | POA: Diagnosis present

## 2019-03-21 DIAGNOSIS — R471 Dysarthria and anarthria: Secondary | ICD-10-CM | POA: Diagnosis present

## 2019-03-21 DIAGNOSIS — I63512 Cerebral infarction due to unspecified occlusion or stenosis of left middle cerebral artery: Secondary | ICD-10-CM | POA: Diagnosis not present

## 2019-03-21 DIAGNOSIS — E119 Type 2 diabetes mellitus without complications: Secondary | ICD-10-CM | POA: Diagnosis not present

## 2019-03-21 DIAGNOSIS — Z20828 Contact with and (suspected) exposure to other viral communicable diseases: Secondary | ICD-10-CM | POA: Diagnosis present

## 2019-03-21 DIAGNOSIS — I119 Hypertensive heart disease without heart failure: Secondary | ICD-10-CM | POA: Diagnosis present

## 2019-03-21 DIAGNOSIS — Z79899 Other long term (current) drug therapy: Secondary | ICD-10-CM | POA: Diagnosis not present

## 2019-03-21 DIAGNOSIS — E669 Obesity, unspecified: Secondary | ICD-10-CM | POA: Diagnosis present

## 2019-03-21 DIAGNOSIS — S0990XA Unspecified injury of head, initial encounter: Secondary | ICD-10-CM | POA: Diagnosis not present

## 2019-03-21 DIAGNOSIS — D649 Anemia, unspecified: Secondary | ICD-10-CM | POA: Diagnosis present

## 2019-03-21 DIAGNOSIS — I712 Thoracic aortic aneurysm, without rupture: Secondary | ICD-10-CM | POA: Diagnosis not present

## 2019-03-21 DIAGNOSIS — I10 Essential (primary) hypertension: Secondary | ICD-10-CM | POA: Diagnosis not present

## 2019-03-21 DIAGNOSIS — R479 Unspecified speech disturbances: Secondary | ICD-10-CM | POA: Diagnosis not present

## 2019-03-21 DIAGNOSIS — I6602 Occlusion and stenosis of left middle cerebral artery: Secondary | ICD-10-CM | POA: Diagnosis not present

## 2019-03-21 LAB — URINALYSIS, ROUTINE W REFLEX MICROSCOPIC
Bilirubin Urine: NEGATIVE
Glucose, UA: NEGATIVE mg/dL
Hgb urine dipstick: NEGATIVE
Ketones, ur: NEGATIVE mg/dL
Leukocytes,Ua: NEGATIVE
Nitrite: NEGATIVE
Protein, ur: NEGATIVE mg/dL
Specific Gravity, Urine: 1.004 — ABNORMAL LOW (ref 1.005–1.030)
pH: 7 (ref 5.0–8.0)

## 2019-03-21 LAB — DIFFERENTIAL
Abs Immature Granulocytes: 0.01 10*3/uL (ref 0.00–0.07)
Basophils Absolute: 0 10*3/uL (ref 0.0–0.1)
Basophils Relative: 1 %
Eosinophils Absolute: 0.2 10*3/uL (ref 0.0–0.5)
Eosinophils Relative: 4 %
Immature Granulocytes: 0 %
Lymphocytes Relative: 54 %
Lymphs Abs: 2.4 10*3/uL (ref 0.7–4.0)
Monocytes Absolute: 0.3 10*3/uL (ref 0.1–1.0)
Monocytes Relative: 6 %
Neutro Abs: 1.6 10*3/uL — ABNORMAL LOW (ref 1.7–7.7)
Neutrophils Relative %: 35 %

## 2019-03-21 LAB — CBC
HCT: 35.2 % — ABNORMAL LOW (ref 39.0–52.0)
Hemoglobin: 11.7 g/dL — ABNORMAL LOW (ref 13.0–17.0)
MCH: 31.8 pg (ref 26.0–34.0)
MCHC: 33.2 g/dL (ref 30.0–36.0)
MCV: 95.7 fL (ref 80.0–100.0)
Platelets: 167 10*3/uL (ref 150–400)
RBC: 3.68 MIL/uL — ABNORMAL LOW (ref 4.22–5.81)
RDW: 13.8 % (ref 11.5–15.5)
WBC: 4.4 10*3/uL (ref 4.0–10.5)
nRBC: 0 % (ref 0.0–0.2)

## 2019-03-21 LAB — APTT: aPTT: 49 seconds — ABNORMAL HIGH (ref 24–36)

## 2019-03-21 LAB — COMPREHENSIVE METABOLIC PANEL
ALT: 16 U/L (ref 0–44)
AST: 16 U/L (ref 15–41)
Albumin: 3.8 g/dL (ref 3.5–5.0)
Alkaline Phosphatase: 64 U/L (ref 38–126)
Anion gap: 7 (ref 5–15)
BUN: 12 mg/dL (ref 8–23)
CO2: 27 mmol/L (ref 22–32)
Calcium: 9.5 mg/dL (ref 8.9–10.3)
Chloride: 103 mmol/L (ref 98–111)
Creatinine, Ser: 0.95 mg/dL (ref 0.61–1.24)
GFR calc Af Amer: >60 mL/min (ref >60)
GFR calc non Af Amer: >60 mL/min (ref >60)
Glucose, Bld: 113 mg/dL — ABNORMAL HIGH (ref 70–99)
Potassium: 4.2 mmol/L (ref 3.5–5.1)
Sodium: 137 mmol/L (ref 135–145)
Total Bilirubin: 0.3 mg/dL (ref 0.3–1.2)
Total Protein: 7.4 g/dL (ref 6.5–8.1)

## 2019-03-21 LAB — CBG MONITORING, ED
Glucose-Capillary: 109 mg/dL — ABNORMAL HIGH (ref 70–99)
Glucose-Capillary: 114 mg/dL — ABNORMAL HIGH (ref 70–99)

## 2019-03-21 LAB — PROTIME-INR
INR: 1.1 (ref 0.8–1.2)
Prothrombin Time: 14 seconds (ref 11.4–15.2)

## 2019-03-21 MED ORDER — CLOPIDOGREL BISULFATE 300 MG PO TABS
300.0000 mg | ORAL_TABLET | Freq: Once | ORAL | Status: AC
  Administered 2019-03-21: 300 mg via ORAL
  Filled 2019-03-21: qty 1

## 2019-03-21 MED ORDER — ATORVASTATIN CALCIUM 80 MG PO TABS
80.0000 mg | ORAL_TABLET | Freq: Every day | ORAL | Status: DC
  Administered 2019-03-22: 80 mg via ORAL
  Filled 2019-03-21: qty 1

## 2019-03-21 MED ORDER — CLOPIDOGREL BISULFATE 75 MG PO TABS
75.0000 mg | ORAL_TABLET | Freq: Every day | ORAL | Status: DC
  Administered 2019-03-22 – 2019-03-23 (×2): 75 mg via ORAL
  Filled 2019-03-21 (×2): qty 1

## 2019-03-21 MED ORDER — SODIUM CHLORIDE 0.9% FLUSH: 3.0000 mL | Freq: Once | INTRAVENOUS | Status: DC

## 2019-03-21 MED ORDER — STROKE: EARLY STAGES OF RECOVERY BOOK
Freq: Once | Status: AC
  Administered 2019-03-22: 04:00:00
  Filled 2019-03-21: qty 1

## 2019-03-21 MED ORDER — TAMSULOSIN HCL 0.4 MG PO CAPS
0.4000 mg | ORAL_CAPSULE | Freq: Every day | ORAL | Status: DC
  Administered 2019-03-22 – 2019-03-23 (×2): 0.4 mg via ORAL
  Filled 2019-03-21 (×2): qty 1

## 2019-03-21 MED ORDER — ACETAMINOPHEN 650 MG RE SUPP: 650.0000 mg | RECTAL | Status: DC | PRN

## 2019-03-21 MED ORDER — IOHEXOL 350 MG/ML SOLN
100.0000 mL | Freq: Once | INTRAVENOUS | Status: AC | PRN
  Administered 2019-03-21: 100 mL via INTRAVENOUS

## 2019-03-21 MED ORDER — ASPIRIN 81 MG PO CHEW
81.0000 mg | CHEWABLE_TABLET | Freq: Every day | ORAL | Status: DC
  Administered 2019-03-22: 81 mg via ORAL
  Filled 2019-03-21: qty 1

## 2019-03-21 MED ORDER — SODIUM CHLORIDE 0.9 % IV SOLN
INTRAVENOUS | Status: DC
  Administered 2019-03-22 (×2): via INTRAVENOUS

## 2019-03-21 MED ORDER — ACETAMINOPHEN 160 MG/5ML PO SOLN: 650.0000 mg | ORAL | Status: DC | PRN

## 2019-03-21 MED ORDER — ENOXAPARIN SODIUM 40 MG/0.4ML ~~LOC~~ SOLN
40.0000 mg | SUBCUTANEOUS | Status: DC
  Administered 2019-03-22 – 2019-03-23 (×2): 40 mg via SUBCUTANEOUS
  Filled 2019-03-21 (×2): qty 0.4

## 2019-03-21 MED ORDER — ACETAMINOPHEN 325 MG PO TABS: 650.0000 mg | ORAL_TABLET | ORAL | Status: DC | PRN

## 2019-03-21 NOTE — ED Provider Notes (Signed)
[facility] EMERGENCY DEPARTMENT Provider Note   CSN: [id number] Arrival date & time: 03/21/19  1626     History   Chief Complaint Chief Complaint  Patient presents with  . Weakness  . Aphasia  . Fall    HPI Stephen Flynn is a 85 y.o. male presenting for evaluation of right leg weakness and aphasia.  Patient states 2 days ago he fell.  Since then, he has noticed difficulty with word finding and weakness of his right leg.  He reports intermittent numbness of his right leg.  He is not sure if the weakness and numbness began before he fell or after.  He denies pain in his leg.  He denies history of similar.  He denies headache, vision changes, chest pain, shortness breath, cough, nausea, vomiting abdominal pain, urinary symptoms, abnormal bowel movements.  Wife present during HPI, states patient has had difficulty with word finding the past 2 days, waxes and wanes in intensity.  Patient normally walks without a limp or abnormal gait, but he has been limping due to right leg weakness.  Patient takes tamsulosin, blood pressure medications, and 81 mg aspirin, is not on blood thinners.  History of hypertension, hyperlipidemia, and GI bleed.  PCP: [name] [facility].      HPI  Past Medical History:  Diagnosis Date  . Anemia   . Aortic insufficiency   . Chest pain   . H/O: GI bleed   . Hyperlipidemia   . Hypertension   . Leg cramps     Patient Active Problem List   Diagnosis Date Noted  . Ischemic stroke ([place]) 03/21/2019  . Essential hypertension 03/21/2019  . BPH (benign prostatic hyperplasia) 03/21/2019  . UNSPECIFIED ANEMIA 07/25/2007  . G E R D 07/25/2007  . HIATAL HERNIA 07/25/2007  . HEMORRHOIDS, INTERNAL 05/28/2007  . UNS GASTRITIS&GASTRODUODITIS W/O MENTION HEMORR 05/28/2007  . DUODENITIS WITHOUT MENTION OF HEMORRHAGE 05/28/2007    Past Surgical History:  Procedure Laterality Date  . CARDIOVASCULAR STRESS TEST  09/04/2005  . US  ECHOCARDIOGRAPHY  11/26/2007   ef 55-60%        Home Medications    Prior to Admission medications   Medication Sig Start Date End Date Taking? Authorizing Provider  amLODipine (NORVASC) 5 MG tablet Take 5 mg by mouth daily.   Yes [provider]  aspirin 81 MG tablet Take 81 mg by mouth daily.   Yes [provider]  Polyethyl Glycol-Propyl Glycol (SYSTANE FREE OP) Place 1 drop into both eyes daily as needed.   Yes [provider]  tamsulosin (FLOMAX) 0.4 MG CAPS capsule Take 0.4 mg by mouth.   Yes [provider]    Family History No family history on file.  Social History Social History   Tobacco Use  . Smoking status: Former Research scientist (life sciences)  . Smokeless tobacco: Never Used  Substance Use Topics  . Alcohol use: No  . Drug use: No     Allergies   Penicillins   Review of Systems Review of Systems  Neurological: Positive for speech difficulty, weakness and numbness.  All other systems reviewed and are negative.    Physical Exam Updated Vital Signs BP (!) 193/118   Pulse 63   Temp 98.7 F (37.1 C) (Oral)   Resp 15   SpO2 98%   Physical Exam Vitals signs and nursing note reviewed.  Constitutional:      General: He is not in acute distress.    Appearance: He  is well-developed.     Comments: Elderly male who appears nontoxic  HENT:     Head: Normocephalic and atraumatic.      Comments: Nontender lesion of the right side occiput, patient states is been present for 70 years.  Likely lipoma. Eyes:     Extraocular Movements: Extraocular movements intact.     Conjunctiva/sclera: Conjunctivae normal.     Pupils: Pupils are equal, round, and reactive to light.  Neck:     Musculoskeletal: Normal range of motion and neck supple.  Cardiovascular:     Rate and Rhythm: Normal rate and regular rhythm.     Pulses: Normal pulses.  Pulmonary:     Effort: Pulmonary effort is normal. No respiratory distress.     Breath sounds: Normal breath  sounds. No wheezing.  Abdominal:     General: There is no distension.     Palpations: Abdomen is soft. There is no mass.     Tenderness: There is no abdominal tenderness. There is no guarding or rebound.  Musculoskeletal: Normal range of motion.     Comments: Strength and sensation intact x4.  Bilateral 2+ pitting edema of the lower extremities.  Radial pedal pulses intact.  No tenderness palpation of the back or cervical spine.  Skin:    General: Skin is warm and dry.  Neurological:     Mental Status: He is alert and oriented to person, place, and time.     GCS: GCS eye subscore is 4. GCS verbal subscore is 5. GCS motor subscore is 6.     Sensory: Sensation is intact.     Motor: No pronator drift.     Coordination: Finger-Nose-Finger Test normal.     Gait: Gait abnormal.     Comments: Occasional slight hesitation/difficulty with word finding.  Patient is alert and oriented.  Negative pronator drift.  Nose to finger intact.  Ataxic gait due to R leg weakness. Sensation intact of lower ext bilaterally.       ED Treatments / Results  Labs (all labs ordered are listed, but only abnormal results are displayed) Labs Reviewed  APTT - Abnormal; Notable for the following components:      Result Value   aPTT 49 (*)    All other components within normal limits  CBC - Abnormal; Notable for the following components:   RBC 3.68 (*)    Hemoglobin 11.7 (*)    HCT 35.2 (*)    All other components within normal limits  DIFFERENTIAL - Abnormal; Notable for the following components:   Neutro Abs 1.6 (*)    All other components within normal limits  COMPREHENSIVE METABOLIC PANEL - Abnormal; Notable for the following components:   Glucose, Bld 113 (*)    All other components within normal limits  URINALYSIS, ROUTINE W REFLEX MICROSCOPIC - Abnormal; Notable for the following components:   Color, Urine STRAW (*)    Specific Gravity, Urine 1.004 (*)    All other components within normal limits   CBG MONITORING, ED - Abnormal; Notable for the following components:   Glucose-Capillary 109 (*)    All other components within normal limits  SARS CORONAVIRUS 2 (TAT 6-24 HRS)  PROTIME-INR  I-STAT CHEM 8, ED    EKG None  Radiology Ct Head Wo Contrast  Result Date: 03/21/2019 CLINICAL DATA:  Status post fall. Difficulty with speech. Alert and oriented. EXAM: CT HEAD WITHOUT CONTRAST TECHNIQUE: Contiguous axial images were obtained from the base of the skull through the vertex without  intravenous contrast. COMPARISON:  None. FINDINGS: Brain: No evidence of acute infarction, hemorrhage, extra-axial collection, ventriculomegaly, or mass effect. Generalized cerebral atrophy. Periventricular white matter low attenuation likely secondary to microangiopathy. Vascular: Cerebrovascular atherosclerotic calcifications are noted. Skull: Negative for fracture or focal lesion. Sinuses/Orbits: Visualized portions of the orbits are unremarkable. Left right mastoid mastoid effusion. Sinuses clear. Small bilateral maxillary sinus air-fluid levels. Mucosal thickening involving the ethmoid sinuses, left sphenoid sinus and right frontoethmoidal recess. Other: None. IMPRESSION: 1. No acute intracranial pathology. Electronically Signed   By: Kathreen Devoid   On: 03/21/2019 19:54   Mr Brain Wo Contrast (neuro Protocol)  Result Date: 03/21/2019 CLINICAL DATA:  Ataxia, stroke suspected. EXAM: MRI HEAD WITHOUT CONTRAST TECHNIQUE: Multiplanar, multiecho pulse sequences of the brain and surrounding structures were obtained without intravenous contrast. COMPARISON:  Head CT 03/21/2019 FINDINGS: Multiple sequences are moderately motion degraded. Brain: There are punctate acute to early subacute infarcts involving the left corona radiata and left insula. A chronic microhemorrhage is noted in the posterior left temporal lobe. Patchy T2 hyperintensities in the cerebral white matter bilaterally are nonspecific but compatible with  moderate chronic small vessel ischemic disease. There are multiple small chronic infarcts involving the left centrum semiovale and corona radiata in a distribution suggesting deep watershed ischemia. There is mild-to-moderate cerebral atrophy. No mass, midline shift, or extra-axial fluid collection is identified. Vascular: Abnormal appearance of the proximal left MCA. Skull and upper cervical spine: Unremarkable bone marrow signal. Sinuses/Orbits: Unremarkable orbits. Mild-to-moderate mucosal thickening in the paranasal sinuses. Moderate volume left maxillary sinus fluid. Moderate left and trace right mastoid effusions. Other: 4.7 cm right suboccipital lipoma. IMPRESSION: 1. Punctate acute to early subacute infarcts in the left corona radiata and left insula (MCA territory). 2. Moderate chronic small vessel ischemic disease. 3. Multiple chronic white matter infarcts in the left centrum semiovale and corona radiata suggesting deep watershed ischemia and with an abnormal appearance of the proximal left MCA which may reflect occlusion or severely diminished flow. MRA or CTA is recommended for further evaluation. Electronically Signed   By: Logan Bores M.D.   On: 03/21/2019 20:56    Procedures Procedures (including critical care time)  Medications Ordered in ED Medications  sodium chloride flush (NS) 0.9 % injection 3 mL (3 mLs Intravenous Not Given 03/21/19 1854)     Initial Impression / Assessment and Plan / ED Course  I have reviewed the triage vital signs and the nursing notes.  Pertinent labs & imaging results that were available during my care of the patient were reviewed by me and considered in my medical decision making (see chart for details).        Patient presenting for evaluation of right leg weakness and difficulty with his speech.  Physical exam shows patient who appears nontoxic.  He has an ataxic gait due to weakness and some hesitation/difficulty with word finding.  Otherwise,  neuro exam is normal.  Symptoms began 2 days ago, does not meet code stroke criteria as he is outside any treatment window.  Will obtain labs and CT head for further evaluation.  Labs overall reassuring.  CT head negative.  MRI pending.  MRI positive for acute stroke.  Will consult with neurology.  Discussed with Dr. Leonel Ramsay who evaluated the patient.  Will call for admission to the hospital service.  Discussed with Dr. Si Raider from [facility], pt to be admitted.   Final Clinical Impressions(s) / ED Diagnoses   Final diagnoses:  Cerebrovascular accident (CVA), unspecified mechanism ([place])  ED Discharge Orders    None       Franchot Heidelberg, PA-C 03/21/19 2203    Davonna Belling, MD 03/24/19 1452

## 2019-03-21 NOTE — ED Notes (Signed)
ED TO INPATIENT HANDOFF REPORT  ED Nurse Name and Phone #: Annie Main [phone]  S Name/Age/Gender Stephen Flynn 85 y.o. male Room/Bed: [id number]  Code Status   Code Status: Not on file  Home/SNF/Other Home Patient oriented to: self, place, time and situation Is this baseline? Yes   Triage Complete: Triage complete  Chief Complaint rt leg numbness, fall  Triage Note Pt/wife stated, I fell days ago and I had weakness in rt. Leg and the speech is garbled. Pt. Alert and oriented x 4 . No arm drift.  Wife stated, normal 2 days ago.   Allergies Allergies  Allergen Reactions  . Penicillins Hives    Did it involve swelling of the face/tongue/throat, SOB, or low BP? No Did it involve sudden or severe rash/hives, skin peeling, or any reaction on the inside of your mouth or nose? yes Did you need to seek medical attention at a hospital or doctor's office? yes When did it last happen?child If all above answers are "NO", may proceed with cephalosporin use.    Level of Care/Admitting Diagnosis ED Disposition    ED Disposition Condition Comment   Admit  Hospital Area: [facility] [[id number]]  Level of Care: Telemetry Medical [104]  Covid Evaluation: Asymptomatic Screening Protocol (No Symptoms)  [facility]: [id number]  Admitting Physician: Eston Esters  Attending Physician: Gwynne Edinger [id number]  Estimated length of stay: past midnight tomorrow  Certification:: I certify this patient will need inpatient services for at least 2 midnights  PT Class (Do Not Modify): Inpatient [101]  PT Acc Code (Do Not Modify): Private [1]       B Medical/Surgery History Past Medical History:  Diagnosis Date  . Anemia   . Aortic insufficiency   . Chest pain   . H/O: GI bleed   . Hyperlipidemia   . Hypertension   . Leg cramps    Past Surgical History:  Procedure Laterality Date  . CARDIOVASCULAR STRESS TEST  09/04/2005  . US  ECHOCARDIOGRAPHY  11/26/2007   ef 55-60%     A IV Location/Drains/Wounds Patient Lines/Drains/Airways Status   Active Line/Drains/Airways    Name:   Placement date:   Placement time:   Site:   Days:   Peripheral IV 03/21/19 Right Antecubital   03/21/19    1730    Antecubital   less than 1          Intake/Output Last 24 hours No intake or output data in the 24 hours ending 03/21/19 2221  Labs/Imaging Results for orders placed or performed during the hospital encounter of 03/21/19 (from the past 48 hour(s))  CBG monitoring, ED     Status: Abnormal   Collection Time: 03/21/19  5:20 PM  Result Value Ref Range   Glucose-Capillary 109 (H) 70 - 99 mg/dL  Protime-INR     Status: None   Collection Time: 03/21/19  5:24 PM  Result Value Ref Range   Prothrombin Time 14.0 11.4 - 15.2 seconds   INR 1.1 0.8 - 1.2    Comment: (NOTE) INR goal varies based on device and disease states. Performed at [facility], [place] [address], [place], [place] [address]   APTT     Status: Abnormal   Collection Time: 03/21/19  5:24 PM  Result Value Ref Range   aPTT 49 (H) 24 - 36 seconds    Comment:        IF BASELINE aPTT IS ELEVATED, SUGGEST PATIENT RISK ASSESSMENT  BE USED TO DETERMINE APPROPRIATE ANTICOAGULANT THERAPY. Performed at [facility], [place] [address], [place], [place] [address]   CBC     Status: Abnormal   Collection Time: 03/21/19  5:24 PM  Result Value Ref Range   WBC 4.4 4.0 - 10.5 K/uL   RBC 3.68 (L) 4.22 - 5.81 MIL/uL   Hemoglobin 11.7 (L) 13.0 - 17.0 g/dL   HCT 35.2 (L) 39.0 - 52.0 %   MCV 95.7 80.0 - 100.0 fL   MCH 31.8 26.0 - 34.0 pg   MCHC 33.2 30.0 - 36.0 g/dL   RDW 13.8 11.5 - 15.5 %   Platelets 167 150 - 400 K/uL   nRBC 0.0 0.0 - 0.2 %    Comment: Performed at [facility], [place] [address], [place], [place] [address]  Differential     Status: Abnormal   Collection Time: 03/21/19  5:24 PM  Result Value Ref Range   Neutrophils Relative % 35 %    Neutro Abs 1.6 (L) 1.7 - 7.7 K/uL   Lymphocytes Relative 54 %   Lymphs Abs 2.4 0.7 - 4.0 K/uL   Monocytes Relative 6 %   Monocytes Absolute 0.3 0.1 - 1.0 K/uL   Eosinophils Relative 4 %   Eosinophils Absolute 0.2 0.0 - 0.5 K/uL   Basophils Relative 1 %   Basophils Absolute 0.0 0.0 - 0.1 K/uL   Immature Granulocytes 0 %   Abs Immature Granulocytes 0.01 0.00 - 0.07 K/uL    Comment: Performed at [facility], [place] [address], [place], [place] [address]  Comprehensive metabolic panel     Status: Abnormal   Collection Time: 03/21/19  5:24 PM  Result Value Ref Range   Sodium 137 135 - 145 mmol/L   Potassium 4.2 3.5 - 5.1 mmol/L   Chloride 103 98 - 111 mmol/L   CO2 27 22 - 32 mmol/L   Glucose, Bld 113 (H) 70 - 99 mg/dL   BUN 12 8 - 23 mg/dL   Creatinine, Ser 0.95 0.61 - 1.24 mg/dL   Calcium 9.5 8.9 - 10.3 mg/dL   Total Protein 7.4 6.5 - 8.1 g/dL   Albumin 3.8 3.5 - 5.0 g/dL   AST 16 15 - 41 U/L   ALT 16 0 - 44 U/L   Alkaline Phosphatase 64 38 - 126 U/L   Total Bilirubin 0.3 0.3 - 1.2 mg/dL   GFR calc non Af Amer >60 >60 mL/min   GFR calc Af Amer >60 >60 mL/min   Anion gap 7 5 - 15    Comment: Performed at [facility] [address], [place], [place] [address]  Urinalysis, Routine w reflex microscopic     Status: Abnormal   Collection Time: 03/21/19  5:36 PM  Result Value Ref Range   Color, Urine STRAW (A) YELLOW   APPearance CLEAR CLEAR   Specific Gravity, Urine 1.004 (L) 1.005 - 1.030   pH 7.0 5.0 - 8.0   Glucose, UA NEGATIVE NEGATIVE mg/dL   Hgb urine dipstick NEGATIVE NEGATIVE   Bilirubin Urine NEGATIVE NEGATIVE   Ketones, ur NEGATIVE NEGATIVE mg/dL   Protein, ur NEGATIVE NEGATIVE mg/dL   Nitrite NEGATIVE NEGATIVE   Leukocytes,Ua NEGATIVE NEGATIVE    Comment: Performed at [facility] [address], [place], [place] [address]   Ct Head Wo Contrast  Result Date: 03/21/2019 CLINICAL DATA:  Status post fall. Difficulty with speech. Alert and oriented.  EXAM: CT HEAD  WITHOUT CONTRAST TECHNIQUE: Contiguous axial images were obtained from the base of the skull through the vertex without intravenous contrast. COMPARISON:  None. FINDINGS: Brain: No evidence of acute infarction, hemorrhage, extra-axial collection, ventriculomegaly, or mass effect. Generalized cerebral atrophy. Periventricular white matter low attenuation likely secondary to microangiopathy. Vascular: Cerebrovascular atherosclerotic calcifications are noted. Skull: Negative for fracture or focal lesion. Sinuses/Orbits: Visualized portions of the orbits are unremarkable. Left right mastoid mastoid effusion. Sinuses clear. Small bilateral maxillary sinus air-fluid levels. Mucosal thickening involving the ethmoid sinuses, left sphenoid sinus and right frontoethmoidal recess. Other: None. IMPRESSION: 1. No acute intracranial pathology. Electronically Signed   By: Kathreen Devoid   On: 03/21/2019 19:54   Mr Brain Wo Contrast (neuro Protocol)  Result Date: 03/21/2019 CLINICAL DATA:  Ataxia, stroke suspected. EXAM: MRI HEAD WITHOUT CONTRAST TECHNIQUE: Multiplanar, multiecho pulse sequences of the brain and surrounding structures were obtained without intravenous contrast. COMPARISON:  Head CT 03/21/2019 FINDINGS: Multiple sequences are moderately motion degraded. Brain: There are punctate acute to early subacute infarcts involving the left corona radiata and left insula. A chronic microhemorrhage is noted in the posterior left temporal lobe. Patchy T2 hyperintensities in the cerebral white matter bilaterally are nonspecific but compatible with moderate chronic small vessel ischemic disease. There are multiple small chronic infarcts involving the left centrum semiovale and corona radiata in a distribution suggesting deep watershed ischemia. There is mild-to-moderate cerebral atrophy. No mass, midline shift, or extra-axial fluid collection is identified. Vascular: Abnormal appearance of the proximal left MCA.  Skull and upper cervical spine: Unremarkable bone marrow signal. Sinuses/Orbits: Unremarkable orbits. Mild-to-moderate mucosal thickening in the paranasal sinuses. Moderate volume left maxillary sinus fluid. Moderate left and trace right mastoid effusions. Other: 4.7 cm right suboccipital lipoma. IMPRESSION: 1. Punctate acute to early subacute infarcts in the left corona radiata and left insula (MCA territory). 2. Moderate chronic small vessel ischemic disease. 3. Multiple chronic white matter infarcts in the left centrum semiovale and corona radiata suggesting deep watershed ischemia and with an abnormal appearance of the proximal left MCA which may reflect occlusion or severely diminished flow. MRA or CTA is recommended for further evaluation. Electronically Signed   By: Logan Bores M.D.   On: 03/21/2019 20:56    Pending Labs Unresulted Labs (From admission, onward)    Start     Ordered   03/21/19 1953  SARS CORONAVIRUS 2 (TAT 6-24 HRS) Nasopharyngeal Nasopharyngeal Swab  (Asymptomatic/Tier 2 Patients Labs)  Once,   STAT    Question Answer Comment  Is this test for diagnosis or screening Screening   Symptomatic for COVID-19 as defined by CDC No   Hospitalized for COVID-19 No   Admitted to ICU for COVID-19 No   Previously tested for COVID-19 Unknown   Resident in a congregate (group) care setting No   Employed in healthcare setting No      03/21/19 1952   Signed and Held  Hemoglobin A1c  Tomorrow morning,   R     Signed and Held   Signed and Held  TSH  Tomorrow morning,   R     Signed and Held   Signed and Held  Lipid panel  Tomorrow morning,   R     Signed and Held   Signed and Held  PSA  Tomorrow morning,   R     Signed and Held   Signed and Held  Iron and TIBC  Tomorrow morning,   R     Signed and Held  Signed and Held  Ferritin  Tomorrow morning,   R     Signed and Held   Signed and Held  Vitamin B12  Tomorrow morning,   R     Signed and Held   Signed and Held  Folate RBC   Tomorrow morning,   R     Signed and Held   Signed and Held  CBC  (enoxaparin (LOVENOX)    CrCl >/= 30 ml/min)  Once,   R    Comments: Baseline for enoxaparin therapy IF NOT ALREADY DRAWN.  Notify MD if PLT < 100 K.    Signed and Held   Signed and Held  Creatinine, serum  (enoxaparin (LOVENOX)    CrCl >/= 30 ml/min)  Once,   R    Comments: Baseline for enoxaparin therapy IF NOT ALREADY DRAWN.    Signed and Held   Signed and Held  Creatinine, serum  (enoxaparin (LOVENOX)    CrCl >/= 30 ml/min)  Weekly,   R    Comments: while on enoxaparin therapy    Signed and Held          Vitals/Pain Today's Vitals   03/21/19 1652 03/21/19 1915 03/21/19 2115 03/21/19 2136  BP:   (!) 193/118   Pulse:  63    Resp:  15    Temp:      TempSrc:      SpO2:  98%    PainSc: 0-No pain   0-No pain    Isolation Precautions No active isolations  Medications Medications  sodium chloride flush (NS) 0.9 % injection 3 mL (3 mLs Intravenous Not Given 03/21/19 1854)    Mobility Walks with assistance High fall risk  Focused Assessments Neuro Assessment Handoff:  Swallow screen pass? Yes    NIH Stroke Scale ( + Modified Stroke Scale Criteria)  Level of Consciousness (1a.)   : Alert, keenly responsive LOC Questions (1b. )   +: Answers both questions correctly LOC Commands (1c. )   + : Performs both tasks correctly Best Gaze (2. )  +: Normal Visual (3. )  +: Partial hemianopia Facial Palsy (4. )    : Normal symmetrical movements Motor Arm, Left (5a. )   +: No drift Motor Arm, Right (5b. )   +: No drift Motor Leg, Left (6a. )   +: No drift Motor Leg, Right (6b. )   +: No drift Limb Ataxia (7. ): Absent Sensory (8. )   +: Normal, no sensory loss Best Language (9. )   +: No aphasia Dysarthria (10. ): Normal Extinction/Inattention (11.)   +: No Abnormality Modified SS Total  +: 1 Complete NIHSS TOTAL: 3 Last date known well: 03/20/19 Last time known well: 1600 Neuro Assessment: Within Defined  Limits Neuro Checks:      Last Documented NIHSS Modified Score: 1 (03/21/19 2218) Has TPA been given? No If patient is a Neuro Trauma and patient is going to OR before floor call report to [facility] nurse: [phone] or [phone]     R Recommendations: See Admitting Provider Note  Report given to:   Additional Notes:

## 2019-03-21 NOTE — H&P (Signed)
History and Physical    Stephen Flynn [id number] DOB: 14-Jul-1927 DOA: 03/21/2019  PCP: [name], MD  Patient coming from: home   Chief Complaint: gait abnormality  HPI: Stephen Flynn is a 85 y.o. male with medical history significant for htn who presents with above.  2 days of symptoms. Unsteady gait, weakness of right leg. Also slurred speech and trouble with word finding. No history of stroke, denies history arrhythmia. Denies history diabetes. Non-smoker, denies toxic habits. On aspirin and amlodipine, not on statin. No cough or vomiting. No fevers. No chest pain or palpitations. No abd pain. No diarrhea. No falls. Symptoms stable the past two days  ED Course: labs, ct, mri, neurology consult  Review of Systems: As per HPI otherwise 10 point review of systems negative.    Past Medical History:  Diagnosis Date   Anemia    Aortic insufficiency    Chest pain    H/O: GI bleed    Hyperlipidemia    Hypertension    Leg cramps     Past Surgical History:  Procedure Laterality Date   CARDIOVASCULAR STRESS TEST  09/04/2005   US ECHOCARDIOGRAPHY  11/26/2007   ef 55-60%     reports that he has quit smoking. He has never used smokeless tobacco. He reports that he does not drink alcohol or use drugs.  Allergies  Allergen Reactions   Penicillins Hives    Did it involve swelling of the face/tongue/throat, SOB, or low BP? No Did it involve sudden or severe rash/hives, skin peeling, or any reaction on the inside of your mouth or nose? yes Did you need to seek medical attention at a hospital or doctor's office? yes When did it last happen?child If all above answers are NO, may proceed with cephalosporin use.    No family history on file.  Prior to Admission medications   Medication Sig Start Date End Date Taking? Authorizing Provider  aspirin 81 MG tablet Take 81 mg by mouth daily.   Yes [provider]  Polyethyl Glycol-Propyl Glycol (SYSTANE FREE  OP) Place 1 drop into both eyes daily as needed.   Yes [provider]  tamsulosin (FLOMAX) 0.4 MG CAPS capsule Take 0.4 mg by mouth.   Yes [provider]    Physical Exam: Vitals:   03/21/19 1645 03/21/19 1915 03/21/19 2115  BP: (!) 162/99  (!) 193/118  Pulse: 68 63   Resp: 18 15   Temp: 98.7 F (37.1 C)    TempSrc: Oral    SpO2: 98% 98%     Constitutional: No acute distress Head: Atraumatic Eyes: Conjunctiva clear ENM: Moist mucous membranes. Normal dentition.  Neck: Supple Respiratory: Clear to auscultation bilaterally, no wheezing/rales/rhonchi. Normal respiratory effort. No accessory muscle use. . Cardiovascular: Regular rate and rhythm. Soft systolic murmur Abdomen: Non-tender, mod distended. No masses. No rebound or guarding. Positive bowel sounds. Musculoskeletal: No joint deformity upper and lower extremities. Normal ROM, no contractures. Normal muscle tone.  Skin: No rashes, lesions, or ulcers.  Extremities: mild LE pitting edema. Palpable peripheral pulses. Neurologic: Alert, speech not fluent. Cn 2-12 grossly intact but doesn't smile well. Grip strength and dorsiflexion decreased on right. Ataxic gait per EDP Psychiatric: Normal insight and judgement.   Labs on Admission: I have personally reviewed following labs and imaging studies  CBC: Recent Labs  Lab 03/21/19 1724  WBC 4.4  NEUTROABS 1.6*  HGB 11.7*  HCT 35.2*  MCV 95.7  PLT [id number]   Basic Metabolic Panel: Recent Labs  Lab 03/21/19 1724  NA 137  K 4.2  CL 103  CO2 27  GLUCOSE 113*  BUN 12  CREATININE 0.95  CALCIUM 9.5   GFR: CrCl cannot be calculated (Unknown ideal weight.). Liver Function Tests: Recent Labs  Lab 03/21/19 1724  AST 16  ALT 16  ALKPHOS 64  BILITOT 0.3  PROT 7.4  ALBUMIN 3.8   No results for input(s): LIPASE, AMYLASE in the last 168 hours. No results for input(s): AMMONIA in the last 168 hours. Coagulation Profile: Recent Labs  Lab 03/21/19 1724   INR 1.1   Cardiac Enzymes: No results for input(s): CKTOTAL, CKMB, CKMBINDEX, TROPONINI in the last 168 hours. BNP (last 3 results) No results for input(s): PROBNP in the last 8760 hours. HbA1C: No results for input(s): HGBA1C in the last 72 hours. CBG: Recent Labs  Lab 03/21/19 1720  GLUCAP 109*   Lipid Profile: No results for input(s): CHOL, HDL, LDLCALC, TRIG, CHOLHDL, LDLDIRECT in the last 72 hours. Thyroid Function Tests: No results for input(s): TSH, T4TOTAL, FREET4, T3FREE, THYROIDAB in the last 72 hours. Anemia Panel: No results for input(s): VITAMINB12, FOLATE, FERRITIN, TIBC, IRON, RETICCTPCT in the last 72 hours. Urine analysis:    Component Value Date/Time   COLORURINE STRAW (A) 03/21/2019 1736   APPEARANCEUR CLEAR 03/21/2019 1736   LABSPEC 1.004 (L) 03/21/2019 1736   PHURINE 7.0 03/21/2019 1736   GLUCOSEU NEGATIVE 03/21/2019 1736   HGBUR NEGATIVE 03/21/2019 1736   BILIRUBINUR NEGATIVE 03/21/2019 1736   KETONESUR NEGATIVE 03/21/2019 1736   PROTEINUR NEGATIVE 03/21/2019 1736   NITRITE NEGATIVE 03/21/2019 1736   LEUKOCYTESUR NEGATIVE 03/21/2019 1736    Radiological Exams on Admission: Ct Head Wo Contrast  Result Date: 03/21/2019 CLINICAL DATA:  Status post fall. Difficulty with speech. Alert and oriented. EXAM: CT HEAD WITHOUT CONTRAST TECHNIQUE: Contiguous axial images were obtained from the base of the skull through the vertex without intravenous contrast. COMPARISON:  None. FINDINGS: Brain: No evidence of acute infarction, hemorrhage, extra-axial collection, ventriculomegaly, or mass effect. Generalized cerebral atrophy. Periventricular white matter low attenuation likely secondary to microangiopathy. Vascular: Cerebrovascular atherosclerotic calcifications are noted. Skull: Negative for fracture or focal lesion. Sinuses/Orbits: Visualized portions of the orbits are unremarkable. Left right mastoid mastoid effusion. Sinuses clear. Small bilateral maxillary sinus  air-fluid levels. Mucosal thickening involving the ethmoid sinuses, left sphenoid sinus and right frontoethmoidal recess. Other: None. IMPRESSION: 1. No acute intracranial pathology. Electronically Signed   By: Kathreen Devoid   On: 03/21/2019 19:54   Mr Brain Wo Contrast (neuro Protocol)  Result Date: 03/21/2019 CLINICAL DATA:  Ataxia, stroke suspected. EXAM: MRI HEAD WITHOUT CONTRAST TECHNIQUE: Multiplanar, multiecho pulse sequences of the brain and surrounding structures were obtained without intravenous contrast. COMPARISON:  Head CT 03/21/2019 FINDINGS: Multiple sequences are moderately motion degraded. Brain: There are punctate acute to early subacute infarcts involving the left corona radiata and left insula. A chronic microhemorrhage is noted in the posterior left temporal lobe. Patchy T2 hyperintensities in the cerebral white matter bilaterally are nonspecific but compatible with moderate chronic small vessel ischemic disease. There are multiple small chronic infarcts involving the left centrum semiovale and corona radiata in a distribution suggesting deep watershed ischemia. There is mild-to-moderate cerebral atrophy. No mass, midline shift, or extra-axial fluid collection is identified. Vascular: Abnormal appearance of the proximal left MCA. Skull and upper cervical spine: Unremarkable bone marrow signal. Sinuses/Orbits: Unremarkable orbits. Mild-to-moderate mucosal thickening in the paranasal sinuses. Moderate volume left maxillary sinus fluid. Moderate left and trace  right mastoid effusions. Other: 4.7 cm right suboccipital lipoma. IMPRESSION: 1. Punctate acute to early subacute infarcts in the left corona radiata and left insula (MCA territory). 2. Moderate chronic small vessel ischemic disease. 3. Multiple chronic white matter infarcts in the left centrum semiovale and corona radiata suggesting deep watershed ischemia and with an abnormal appearance of the proximal left MCA which may reflect  occlusion or severely diminished flow. MRA or CTA is recommended for further evaluation. Electronically Signed   By: Logan Bores M.D.   On: 03/21/2019 20:56    EKG: Independently reviewed.rbb  Assessment/Plan Principal Problem:   Ischemic stroke [facility]) Active Problems:   Essential hypertension   BPH (benign prostatic hyperplasia)   # Acute ischemic stroke: r sided weakness, language difficulty. MRI showing infarcts of left corona radiata and insula as well as signs possible "deep watershed ischemia with an abnormal appearnce of the proximal left mca which may reflect occlusion or severely diminished flow." - neurology consult, appreciate recs, including recs for further w/u while inpatient - continue aspirin, start high dose statin - f/u tsh, a1c, lipids - pt/ot and swallow eval ordered - stroke order set utilized w/ appropriate precautions - tele overnight  # htn - here bp 160s - hold home amlodipine for permissive htn  # bph - hx mentions possible prostate cancer; pt denies this - continue home flomax - f/u psa  # normocytic anemia - denies melena. Likely 2/2 chronic disease - f/u iron studies, b12, folate  DVT prophylaxis: lovenox Code Status: full  Family Communication: wife  Disposition Plan: tbd  Consults called: neurology  Admission status: med/surg tele    Desma Maxim MD [facility] Pager [phone]  If 7PM-7AM, please contact night-coverage [email or web address] Password [facility]  03/21/2019, 10:08 PM

## 2019-03-21 NOTE — Consult Note (Addendum)
Neurology Consultation Reason for Consult: Right-sided weakness Referring Physician: Franchot Heidelberg  CC: Right-sided weakness  History is obtained from: Patient  HPI: Stephen Flynn is a 85 y.o. male with a history of hypertension, hyperlipidemia who presents with right-sided weakness that started 2 days ago.  He states that he was walking when his right leg gave out and he fell.  Since that time, he is also had some trouble with his speech, stating that seems to take him longer to get his words out than it should.  He is also noticed a little bit of numbness in the right leg as well.  His wife endorses that he has had some slurred speech.  Due to the symptoms not improving, he sought care in the emergency department today where an MRI reveals an acute stroke.   LKW: 2 days ago tpa given?: no, outside window    ROS: A 14 point ROS was performed and is negative except as noted in the HPI.   Past Medical History:  Diagnosis Date  . Anemia   . Aortic insufficiency   . Chest pain   . H/O: GI bleed   . Hyperlipidemia   . Hypertension   . Leg cramps      No family history on file.   Social History:  reports that he has quit smoking. He has never used smokeless tobacco. He reports that he does not drink alcohol or use drugs.   Exam: Current vital signs: BP (!) 193/118   Pulse 63   Temp 98.7 F (37.1 C) (Oral)   Resp 15   SpO2 98%  Vital signs in last 24 hours: Temp:  [98.7 F (37.1 C)] 98.7 F (37.1 C) (09/18 1645) Pulse Rate:  [63-68] 63 (09/18 1915) Resp:  [15-18] 15 (09/18 1915) BP: (162-193)/(99-118) 193/118 (09/18 2115) SpO2:  [98 %] 98 % (09/18 1915)   Physical Exam  Constitutional: Appears well-developed and well-nourished.  Psych: Affect appropriate to situation Eyes: No scleral injection HENT: No OP obstrucion Head: Normocephalic.  Cardiovascular: Normal rate and regular rhythm.  Respiratory: Effort normal, non-labored breathing GI: Soft.  No  distension. There is no tenderness.  Skin: WDI  Neuro: Mental Status: Patient is awake, alert, oriented to person, place, month, year, and situation. Patient is able to give a clear and coherent history. No signs of aphasia or neglect Cranial Nerves: II: Visual Fields are full. Pupils are equal, round, and reactive to light.   III,IV, VI: EOMI without ptosis or diploplia.  V: Facial sensation is symmetric to temperature VII: Facial movement is notable for mild right facial weakness VIII: hearing is intact to voice X: Uvula elevates symmetrically XI: Shoulder shrug is symmetric. XII: tongue is midline without atrophy or fasciculations.  Motor: Tone is normal. Bulk is normal. 5/5 strength was present in all four extremities.  Sensory: Sensation is diminished to light touch in the right leg Cerebellar: Consistent with weakness on the right, no clear ataxia on the left   I have reviewed labs in epic and the results pertinent to this consultation are: CMP-unremarkable  I have reviewed the images obtained: MRI brain- embolic appearing distribution of punctate left MCA infarcts  Impression: 85 year old male who likely had an embolic infarct that broke up 2 days ago.  He is being admitted for further evaluation and physical therapy.  Recommendations: - HgbA1c, fasting lipid panel - CTA head and neck - Frequent neuro checks - Echocardiogram - Prophylactic therapy-Antiplatelet med: Aspirin 81mg  daily an dplavix 75mg   daily after 300mg  load. - Risk factor modification - Telemetry monitoring - PT consult, OT consult, Speech consult - Stroke team to follow   Roland Rack, MD [facility] [phone]  If 7pm- 7am, please page neurology on call as listed in [place].

## 2019-03-21 NOTE — ED Triage Notes (Signed)
Pt/wife stated, I fell days ago and I had weakness in rt. Leg and the speech is garbled. Pt. Alert and oriented x 4 . No arm drift.  Wife stated, normal 2 days ago.

## 2019-03-22 ENCOUNTER — Inpatient Hospital Stay (HOSPITAL_COMMUNITY): Payer: Medicare PPO

## 2019-03-22 DIAGNOSIS — I639 Cerebral infarction, unspecified: Secondary | ICD-10-CM

## 2019-03-22 DIAGNOSIS — I351 Nonrheumatic aortic (valve) insufficiency: Secondary | ICD-10-CM

## 2019-03-22 LAB — IRON AND TIBC
Iron: 59 ug/dL (ref 45–182)
Saturation Ratios: 23 % (ref 17.9–39.5)
TIBC: 252 ug/dL (ref 250–450)
UIBC: 193 ug/dL

## 2019-03-22 LAB — RAPID URINE DRUG SCREEN, HOSP PERFORMED
Amphetamines: NOT DETECTED
Barbiturates: NOT DETECTED
Benzodiazepines: NOT DETECTED
Cocaine: NOT DETECTED
Opiates: NOT DETECTED
Tetrahydrocannabinol: NOT DETECTED

## 2019-03-22 LAB — CBC
HCT: 36.3 % — ABNORMAL LOW (ref 39.0–52.0)
Hemoglobin: 11.6 g/dL — ABNORMAL LOW (ref 13.0–17.0)
MCH: 30.4 pg (ref 26.0–34.0)
MCHC: 32 g/dL (ref 30.0–36.0)
MCV: 95.3 fL (ref 80.0–100.0)
Platelets: 184 10*3/uL (ref 150–400)
RBC: 3.81 MIL/uL — ABNORMAL LOW (ref 4.22–5.81)
RDW: 13.7 % (ref 11.5–15.5)
WBC: 4.1 10*3/uL (ref 4.0–10.5)
nRBC: 0 % (ref 0.0–0.2)

## 2019-03-22 LAB — LIPID PANEL
Cholesterol: 116 mg/dL (ref 0–200)
HDL: 40 mg/dL — ABNORMAL LOW (ref >40)
LDL Cholesterol: 65 mg/dL (ref 0–99)
Total CHOL/HDL Ratio: 2.9 RATIO
Triglycerides: 55 mg/dL (ref <150)
VLDL: 11 mg/dL (ref 0–40)

## 2019-03-22 LAB — GLUCOSE, CAPILLARY
Glucose-Capillary: 171 mg/dL — ABNORMAL HIGH (ref 70–99)
Glucose-Capillary: 110 mg/dL — ABNORMAL HIGH (ref 70–99)
Glucose-Capillary: 147 mg/dL — ABNORMAL HIGH (ref 70–99)

## 2019-03-22 LAB — CREATININE, SERUM
Creatinine, Ser: 0.9 mg/dL (ref 0.61–1.24)
GFR calc Af Amer: >60 mL/min (ref >60)
GFR calc non Af Amer: >60 mL/min (ref >60)

## 2019-03-22 LAB — VITAMIN B12: Vitamin B-12: 461 pg/mL (ref 180–914)

## 2019-03-22 LAB — PSA: Prostatic Specific Antigen: 24.02 ng/mL — ABNORMAL HIGH (ref 0.00–4.00)

## 2019-03-22 LAB — ECHOCARDIOGRAM COMPLETE
Height: 69 in
Weight: 3781.33 oz

## 2019-03-22 LAB — TSH: TSH: 1.258 u[IU]/mL (ref 0.350–4.500)

## 2019-03-22 LAB — HEMOGLOBIN A1C
Hgb A1c MFr Bld: 7.4 % — ABNORMAL HIGH (ref 4.8–5.6)
Mean Plasma Glucose: 165.68 mg/dL

## 2019-03-22 LAB — FERRITIN: Ferritin: 97 ng/mL (ref 24–336)

## 2019-03-22 LAB — SARS CORONAVIRUS 2 (TAT 6-24 HRS): SARS Coronavirus 2: NEGATIVE

## 2019-03-22 MED ORDER — ASPIRIN EC 325 MG PO TBEC
325.0000 mg | DELAYED_RELEASE_TABLET | Freq: Every day | ORAL | Status: DC
  Administered 2019-03-23: 325 mg via ORAL
  Filled 2019-03-22: qty 1

## 2019-03-22 NOTE — Evaluation (Signed)
Speech Language Pathology Evaluation Patient Details Name: Stephen Flynn MRN: [id number] DOB: 1928-01-25 Today's Date: 03/22/2019 Time: 0940-1005 SLP Time Calculation (min) (ACUTE ONLY): 25 min  Problem List:  Patient Active Problem List   Diagnosis Date Noted  . Ischemic stroke ([place]) 03/21/2019  . Essential hypertension 03/21/2019  . BPH (benign prostatic hyperplasia) 03/21/2019  . UNSPECIFIED ANEMIA 07/25/2007  . G E R D 07/25/2007  . HIATAL HERNIA 07/25/2007  . HEMORRHOIDS, INTERNAL 05/28/2007  . UNS GASTRITIS&GASTRODUODITIS W/O MENTION HEMORR 05/28/2007  . DUODENITIS WITHOUT MENTION OF HEMORRHAGE 05/28/2007   Past Medical History:  Past Medical History:  Diagnosis Date  . Anemia   . Aortic insufficiency   . Chest pain   . H/O: GI bleed   . Hyperlipidemia   . Hypertension   . Leg cramps    Past Surgical History:  Past Surgical History:  Procedure Laterality Date  . CARDIOVASCULAR STRESS TEST  09/04/2005  . US ECHOCARDIOGRAPHY  11/26/2007   ef 55-60%   HPI:  Patient is an 85 y.o. male with PMH: HTN, who presented to hospital with gait abnormality and weakness in right leg that had been going on two days prior to admission to hospital. MRI revealed left corona radiata and insula infarcts and signs of possible deep watershed ischemia with abnormal appearance of proximal left MCA.   Assessment / Plan / Recommendation Clinical Impression  Patient presents with a mild dysarthria and a mild cognitive-linguistic impairment. Dysarthria results in fluctuating intelligibility of speech at conversational level between 80-90% intelligible and was difficult to determine if this is an acute change or premorbid. Cognitive impairment marked by memory retrieval deficit, delays in cognitive processing, with delayed organization of information. Patient does have awareness to his cognitive deficit, saying that for the past 3 weeks he's been "scattered" and he mentioned a recent incident where it  took him 20 minutes to tell his son how to clear a tub drain clog and that he apparently rambled, would forget what he was trying to say, or "couldnt get it out". During today's testing, he did exhibit expressive language deficit which consisted of poor divergent naming, delays in processing when completing category naming, and difficulty with organization when responding to open-ended questions, which resulted in him having to frequently repair or correct himself with what he was trying to say.    SLP Assessment  SLP Recommendation/Assessment: Patient needs continued Speech Lanaguage Pathology Services SLP Visit Diagnosis: Cognitive communication deficit (R41.841);Dysarthria and anarthria (R47.1)    Follow Up Recommendations  Home health SLP    Frequency and Duration min 1 x/week  1 week      SLP Evaluation Cognition  Overall Cognitive Status: Impaired/Different from baseline Arousal/Alertness: Awake/alert Orientation Level: Oriented X4 Attention: Alternating Alternating Attention: Appears intact Memory: Impaired Memory Impairment: Retrieval deficit;Decreased recall of new information Awareness: Appears intact Problem Solving: Appears intact Executive Function: Reasoning;Self Monitoring Reasoning: Appears intact Self Monitoring: Appears intact       Comprehension  Auditory Comprehension Overall Auditory Comprehension: Appears within functional limits for tasks assessed    Expression Expression Primary Mode of Expression: Verbal Verbal Expression Overall Verbal Expression: Impaired Initiation: No impairment Level of Generative/Spontaneous Verbalization: Conversation Repetition: Impaired Level of Impairment: Sentence level Naming: Impairment Responsive: 76-100% accurate Convergent: 75-100% accurate Divergent: 50-74% accurate Other Naming Comments: named 7 words that start with letter F on MOCA, in 60 second time limit Pragmatics: No impairment Non-Verbal Means of  Communication: Not applicable   Oral / Motor  Oral  Motor/Sensory Function Overall Oral Motor/Sensory Function: Mild impairment Facial ROM: Within Functional Limits Facial Symmetry: Abnormal symmetry right Facial Strength: Within Functional Limits Facial Sensation: Within Functional Limits Lingual ROM: Within Functional Limits Lingual Symmetry: Within Functional Limits Lingual Strength: Within Functional Limits Lingual Sensation: Within Functional Limits Velum: Within Functional Limits Mandible: Within Functional Limits Motor Speech Overall Motor Speech: Impaired Respiration: Within functional limits Phonation: Normal Resonance: Within functional limits Articulation: Impaired Level of Impairment: Conversation Intelligibility: Intelligibility reduced Word: 75-100% accurate Phrase: 75-100% accurate Sentence: 75-100% accurate Conversation: 75-100% accurate(fluctuates between 80-90% intelligible when context not known) Motor Planning: Witnin functional limits Motor Speech Errors: Not applicable Effective Techniques: Slow rate   GO                    Dannial Monarch 03/22/2019, 3:34 PM  Sonia Baller, MA, CCC-SLP Speech Therapy [facility]

## 2019-03-22 NOTE — Progress Notes (Signed)
PT Cancellation Note  Patient Details Name: Stephen Flynn MRN: [id number] DOB: 03/05/1928   Cancelled Treatment:    Reason Eval/Treat Not Completed: Active bedrest order. Please update activity order, when appropriate, for PT to proceed with eval. Thank you.    [name] 03/22/2019, 8:01 AM  Lorrin Goodell, PT  Office # [phone] Pager [phone]

## 2019-03-22 NOTE — Progress Notes (Signed)
Pt admitted to the unit from ED: pt alert and verbally responsive; pt ambulated with x1 assist from stretcher to BR upon arrival to the unit. Pt skin clean, dry and intact with no pressure ulcer or opened wound noted. Pt oriented to the unit and room; fall/safety precaution and prevention education completed. Telemetry applied and verified with CCMD; NT called to second verify. Pt resting in bed with call light within reach; bed alarm on; will continue to closely monitor pt. PDarden Palmer Shareese Macha RN   03/22/19 0300  Vitals  Temp 98.6 F (37 C)  Temp Source Oral  BP (!) 166/97  MAP (mmHg) 117  BP Location Left Arm  BP Method Automatic  Patient Position (if appropriate) Lying  Pulse Rate 66  Resp 18  Oxygen Therapy  SpO2 96 %  O2 Device Room Air  Pain Assessment  Pain Scale 0-10  Pain Score 0  MEWS Score  MEWS RR 0  MEWS Pulse 0  MEWS Systolic 0  MEWS LOC 0  MEWS Temp 0  MEWS Score 0  MEWS Score Color Green

## 2019-03-22 NOTE — Progress Notes (Addendum)
Following report of short segment occlusion of the left M1, I revisited with the patient his symptoms.  Both he and his wife agree that he has had no waxing/waning of his symptoms and his symptoms been static since onset more than 2 days ago.  Since he has been stable at home for that amount of time, I think we can continue with every 2 neurochecks which can be performed in his current setting.  If he were to begin to have waxing/waning or worsening, then further considerations could be made at that time.  Would continue permissive hypertension to 220/120.  Roland Rack, MD [facility] [phone]  If 7pm- 7am, please page neurology on call as listed in [place].

## 2019-03-22 NOTE — Progress Notes (Signed)
STROKE TEAM PROGRESS NOTE   INTERVAL HISTORY His family is not at the bedside.  Pt sitting in bed for lunch, able to use right hand with utensil without problem. Still complains of mild slurry speech.   OBJECTIVE Vitals:   03/22/19 0200 03/22/19 0257 03/22/19 0300 03/22/19 0500  BP: (!) 188/108  (!) 166/97 (!) 161/100  Pulse: 61  66 67  Resp:   18 20  Temp:   98.6 F (37 C) 98.6 F (37 C)  TempSrc:   Oral Oral  SpO2: 96%  96% 98%  Weight:  107.2 kg    Height:  5\' 9"  (1.753 m)      CBC:  Recent Labs  Lab 03/21/19 1724 03/22/19 0408  WBC 4.4 4.1  NEUTROABS 1.6*  --   HGB 11.7* 11.6*  HCT 35.2* 36.3*  MCV 95.7 95.3  PLT 167 [id number]    Basic Metabolic Panel:  Recent Labs  Lab 03/21/19 1724 03/22/19 0408  NA 137  --   K 4.2  --   CL 103  --   CO2 27  --   GLUCOSE 113*  --   BUN 12  --   CREATININE 0.95 0.90  CALCIUM 9.5  --     Lipid Panel:     Component Value Date/Time   CHOL 116 03/22/2019 0408   TRIG 55 03/22/2019 0408   HDL 40 (L) 03/22/2019 0408   CHOLHDL 2.9 03/22/2019 0408   VLDL 11 03/22/2019 0408   LDLCALC 65 03/22/2019 0408   HgbA1c:  Lab Results  Component Value Date   HGBA1C 7.4 (H) 03/22/2019   Urine Drug Screen: No results found for: LABOPIA, COCAINSCRNUR, LABBENZ, AMPHETMU, THCU, LABBARB  Alcohol Level No results found for: ETH  IMAGING  Ct Angio Head W Or Wo Contrast Ct Angio Neck W Or Wo Contrast 03/21/2019 IMPRESSION:  1. Occlusion of the proximal left M1 segment, with moderate collateralization in the left MCA territory.  2. No other intracranial arterial occlusion or high-grade stenosis.  3. Mild bilateral carotid bifurcation atherosclerosis without hemodynamically significant stenosis by NASCET criteria.  4. Ascending thoracic aortic aneurysm. Recommend semi-annual imaging followup by CTA or MRA and referral to cardiothoracic surgery if not already obtained. This recommendation follows 2010  ACCF/AHA/AATS/ACR/ASA/SCA/SCAI/SIR/STS/SVM Guidelines for the Diagnosis and Management of Patients With Thoracic Aortic Disease. Circulation. 2010; [id number]. Aortic aneurysm NOS (ICD10-I71.9) Aortic Atherosclerosis (ICD10-I70.0).   Ct Head Wo Contrast 03/21/2019 IMPRESSION:  1. No acute intracranial pathology.   Mr Brain Wo Contrast (neuro Protocol)  03/21/2019 IMPRESSION:  1. Punctate acute to early subacute infarcts in the left corona radiata and left insula (MCA territory).  2. Moderate chronic small vessel ischemic disease.  3. Multiple chronic white matter infarcts in the left centrum semiovale and corona radiata suggesting deep watershed ischemia and with an abnormal appearance of the proximal left MCA which may reflect occlusion or severely diminished flow. MRA or CTA is recommended for further evaluation.    Transthoracic Echocardiogram  pending   PHYSICAL EXAM  Temp:  [97.6 F (36.4 C)-98.7 F (37.1 C)] 98.3 F (36.8 C) (09/19 1247) Pulse Rate:  [61-78] 66 (09/19 1247) Resp:  [12-20] 20 (09/19 0500) BP: (143-196)/(88-136) 143/88 (09/19 1247) SpO2:  [93 %-99 %] 99 % (09/19 1247) Weight:  [107.2 kg] 107.2 kg (09/19 0257)  General - Well nourished, well developed, in no apparent distress.  Ophthalmologic - fundi not visualized due to noncooperation.  Cardiovascular - Regular rate and rhythm.  Mental Status -  Level of arousal and orientation to time, place, and person were intact. Language including expression, naming, repetition, comprehension was assessed and found intact.  Cranial Nerves II - XII - II - Visual field intact OU. III, IV, VI - Extraocular movements intact. V - Facial sensation intact bilaterally. VII - Right nasolabial fold flattening. VIII - Hearing & vestibular intact bilaterally. X - Palate elevates symmetrically, mild dysarthria. XI - Chin turning & shoulder shrug intact bilaterally. XII - Tongue protrusion intact.  Motor Strength -  The patient's strength was symmetrical in all extremities except right hand slight decreased dexterity and right foot decreased DF 4+/5 and pronator drift was absent.  Bulk was normal and fasciculations were absent.   Motor Tone - Muscle tone was assessed at the neck and appendages and was normal.  Reflexes - The patient's reflexes were symmetrical in all extremities and he had no pathological reflexes.  Sensory - Light touch, temperature/pinprick were assessed and were symmetrical.    Coordination - The patient had normal movements in the hands with no ataxia or dysmetria.  Tremor was absent.  Gait and Station - deferred.   ASSESSMENT/PLAN [name] is a 85 y.o. male with history of hypertension, GI bleed history, and hyperlipidemia presenting with right sided weakness, right leg numbness and speech difficulties. He did not receive IV t-PA due to late presentation (>4.5 hours from time of onset).  Stroke: multiple scattered punctate infarcts left MCA territory -likely due to chronic left M1 stenosis/occlusion.  Resultant    CT head - No acute intracranial pathology.   MRI head - Punctate acute to early subacute infarcts in the left corona radiata and left insula (MCA territory).   CTA H&N - Occlusion of the proximal left M1 segment, with moderate collateralization in the left MCA territory.   2D Echo - pending  Lacey Jensen Virus 2  - negative  LDL - 65  HgbA1c - 7.4  UDS - pending  VTE prophylaxis - Lovenox  aspirin 81 mg daily prior to admission, now on aspirin 325 mg daily and clopidogrel 75 mg daily after Plavix load.  Continue DAPT for 3 months due to intracranial stenosis/occlusion, and then Plavix alone.  Patient counseled to be compliant with his antithrombotic medications  Ongoing aggressive stroke risk factor management  Therapy recommendations:  pending  Disposition:  Pending  Left MCA occlusion  CTA head and neck showed left M1 proximal short  segment occlusion with moderate collateralization downstream.  Given his mild stroke presentation, the left M1 stenosis/occlusion likely to be chronic  Recommend aspirin 325 and Plavix 75 DAPT for 3 months and then Plavix alone.  Long-term BP goal 130-150  Not felt to be intracranial stent candidate at this time given mild symptoms and chronic vessel status in nature  Hypertension  BP on the higher end but within post stroke/TIA parameters . Permissive hypertension (OK if < 220/120) but gradually normalize in 5-7 days  . Long-term BP goal 130-150 due to left M1 occlusion . Avoid low BP . Check orthostatic vital   Hyperlipidemia  Home Lipid lowering medication:  none  LDL 65, goal < 70  Current lipid lowering medication: Lipitor 80 mg daily   Continue statin at discharge  Diabetes  New diagnosis  A1c 7.4, goal <7.0  SSI  CBG monitoring  Close PCP follow-up for better DM control  DM coordinator consult  Other Stroke Risk Factors  Advanced age  Former cigarette smoker - quit  Obesity, Body mass index  is 34.9 kg/m., recommend weight loss, diet and exercise as appropriate   Other Active Problems  BPH - Flomax  Ascending thoracic aortic aneurysm. Recommend semi-annual imaging followup by CTA or MRA and referral to cardiothoracic surgery if not already obtained.   Hospital day # 1   Neurology will sign off. Please call with questions. Pt will follow up with stroke clinic Dr. [name] at [facility] in about 4 weeks. Thanks for the consult.   Rosalin Hawking, MD PhD Stroke Neurology 03/22/2019 1:25 PM    To contact Stroke Continuity provider, please refer to [email or web address] After hours, contact General Neurology

## 2019-03-22 NOTE — Progress Notes (Signed)
PROGRESS NOTE    Stephen Flynn  [id number] DOB: May 25, 1928 DOA: 03/21/2019 PCP: [name], MD    Brief Narrative:  Patient is 85 year old male with history of hypertension, hyperlipidemia who presented to the emergency room with right-sided weakness and dysarthria for 2 days.  He has been having some problems that he thought of right hip pain for about a month.  Since last 2 days he was having some trouble speaking, speech will take more than usual so came to the ER.  In the emergency room he was found to be having acute a stroke with minimal deficit.  He was not intervention candidate.   Assessment & Plan:   Principal Problem:   Ischemic stroke [facility]) Active Problems:   Essential hypertension   BPH (benign prostatic hyperplasia)  Acute ischemic left MCA territory stroke: Clinical findings, dysarthria and right facial weakness with minimal right lower extremity weakness. CT head findings, normal. MRI of the brain, punctate acute to early subacute infarcts left corona radiata and left insula on left MCA territory. CTA of the head and neck: Occlusion of proximal left M1 segment, thought to be chronic. 2D echocardiogram, pending. Antiplatelet therapy, patient on aspirin at home.  Currently remains on aspirin Plavix plan for 3 months then Plavix alone. LDL 65.  Goal is less than 70. Hemoglobin A1c is 7.4.  No previously known diabetes.  Will start on metformin on discharge. Continue to work with PT OT.  Followed by neurology stroke team. Planning to keep blood pressures on upper limit, more than 140. He is not on any blood pressure medicine at home.  BPH: on Flomax that he will continue.    DVT prophylaxis: Lovenox subcu Code Status: Full code Family Communication: None Disposition Plan: Home with outpatient therapies.  Pending therapy evaluations.   Consultants:   Neurology  Procedures:   None  Antimicrobials:   None   Subjective: Patient seen and examined.  No  overnight events.  Still has some difficulty speaking out words otherwise denies any complaints.  Patient was seen when he was eating breakfast.  Objective: Vitals:   03/22/19 0300 03/22/19 0500 03/22/19 0835 03/22/19 1247  BP: (!) 166/97 (!) 161/100 (!) 148/136 (!) 143/88  Pulse: 66 67 66 66  Resp: 18 20    Temp: 98.6 F (37 C) 98.6 F (37 C) 97.6 F (36.4 C) 98.3 F (36.8 C)  TempSrc: Oral Oral Oral Oral  SpO2: 96% 98% 98% 99%  Weight:      Height:        Intake/Output Summary (Last 24 hours) at 03/22/2019 1357 Last data filed at 03/22/2019 0522 Gross per 24 hour  Intake 201.67 ml  Output 150 ml  Net 51.67 ml   Filed Weights   03/22/19 0257  Weight: 107.2 kg    Examination:  General exam: Appears calm and comfortable, sitting on the edge of the bed and eating breakfast.  Younger than his stated age. Respiratory system: Clear to auscultation. Respiratory effort normal. Cardiovascular system: S1 & S2 heard, RRR. No JVD, murmurs, rubs, gallops or clicks. No pedal edema. Gastrointestinal system: Abdomen is nondistended, soft and nontender. No organomegaly or masses felt. Normal bowel sounds heard. Central nervous system: Alert and oriented.  Mild right facial droop.  Mild dysarthria.  Grossly normal motor power of all 4 extremities. Skin: No rashes, lesions or ulcers Psychiatry: Judgement and insight appear normal. Mood & affect appropriate.     Data Reviewed: I have personally reviewed following labs and imaging  studies  CBC: Recent Labs  Lab 03/21/19 1724 03/22/19 0408  WBC 4.4 4.1  NEUTROABS 1.6*  --   HGB 11.7* 11.6*  HCT 35.2* 36.3*  MCV 95.7 95.3  PLT 167 [id number]   Basic Metabolic Panel: Recent Labs  Lab 03/21/19 1724 03/22/19 0408  NA 137  --   K 4.2  --   CL 103  --   CO2 27  --   GLUCOSE 113*  --   BUN 12  --   CREATININE 0.95 0.90  CALCIUM 9.5  --    GFR: Estimated Creatinine Clearance: 67.1 mL/min (by C-G formula based on SCr of 0.9  mg/dL). Liver Function Tests: Recent Labs  Lab 03/21/19 1724  AST 16  ALT 16  ALKPHOS 64  BILITOT 0.3  PROT 7.4  ALBUMIN 3.8   No results for input(s): LIPASE, AMYLASE in the last 168 hours. No results for input(s): AMMONIA in the last 168 hours. Coagulation Profile: Recent Labs  Lab 03/21/19 1724  INR 1.1   Cardiac Enzymes: No results for input(s): CKTOTAL, CKMB, CKMBINDEX, TROPONINI in the last 168 hours. BNP (last 3 results) No results for input(s): PROBNP in the last 8760 hours. HbA1C: Recent Labs    03/22/19 0408  HGBA1C 7.4*   CBG: Recent Labs  Lab 03/21/19 1720 03/21/19 2226 03/22/19 1244  GLUCAP 109* 114* 171*   Lipid Profile: Recent Labs    03/22/19 0408  CHOL 116  HDL 40*  LDLCALC 65  TRIG 55  CHOLHDL 2.9   Thyroid Function Tests: Recent Labs    03/22/19 0408  TSH 1.258   Anemia Panel: Recent Labs    03/22/19 0408  VITAMINB12 461  FERRITIN 97  TIBC 252  IRON 59   Sepsis Labs: No results for input(s): PROCALCITON, LATICACIDVEN in the last 168 hours.  Recent Results (from the past 240 hour(s))  SARS CORONAVIRUS 2 (TAT 6-24 HRS) Nasopharyngeal Nasopharyngeal Swab     Status: None   Collection Time: 03/21/19  8:48 PM   Specimen: Nasopharyngeal Swab  Result Value Ref Range Status   SARS Coronavirus 2 NEGATIVE NEGATIVE Final    Comment: (NOTE) SARS-CoV-2 target nucleic acids are NOT DETECTED. The SARS-CoV-2 RNA is generally detectable in upper and lower respiratory specimens during the acute phase of infection. Negative results do not preclude SARS-CoV-2 infection, do not rule out co-infections with other pathogens, and should not be used as the sole basis for treatment or other patient management decisions. Negative results must be combined with clinical observations, patient history, and epidemiological information. The expected result is Negative. Fact Sheet for Patients: SugarRoll.be Fact Sheet  for Healthcare Providers: https://www.woods-mathews.com/ This test is not yet approved or cleared by the [place] FDA and  has been authorized for detection and/or diagnosis of SARS-CoV-2 by FDA under an Emergency Use Authorization (EUA). This EUA will remain  in effect (meaning this test can be used) for the duration of the COVID-19 declaration under Section 56 4(b)(1) of the Act, 21 U.S.C. section 360bbb-3(b)(1), unless the authorization is terminated or revoked sooner. Performed at [facility], [place] [address], [place], [place] [address]          Radiology Studies: Ct Angio Head W Or Wo Contrast  Result Date: 03/21/2019 CLINICAL DATA:  Right-sided weakness for 2 days. Slurred speech. EXAM: CT ANGIOGRAPHY HEAD AND NECK TECHNIQUE: Multidetector CT imaging of the head and neck was performed using the standard protocol during bolus administration of intravenous contrast. Multiplanar  CT image reconstructions and MIPs were obtained to evaluate the vascular anatomy. Carotid stenosis measurements (when applicable) are obtained utilizing NASCET criteria, using the distal internal carotid diameter as the denominator. CONTRAST:  170mL OMNIPAQUE IOHEXOL 350 MG/ML SOLN COMPARISON:  Head CT and brain MRI 03/21/2019 FINDINGS: CTA NECK FINDINGS SKELETON: There is no bony spinal canal stenosis. No lytic or blastic lesion. OTHER NECK: Moderate sinus disease. Right suboccipital subcutaneous lipoma. UPPER CHEST: No pneumothorax or pleural effusion. No nodules or masses. AORTIC ARCH: There is mild calcific atherosclerosis of the aortic arch. Ascending thoracic aorta is dilated measuring 4.5 cm. Conventional 3 vessel aortic branching pattern. The visualized proximal subclavian arteries are widely patent. RIGHT CAROTID SYSTEM: --Common carotid artery: Widely patent origin without common carotid artery dissection or aneurysm. --Internal carotid artery: No dissection, occlusion or aneurysm.  There is mixed density atherosclerosis extending into the proximal ICA, resulting in less than 50% stenosis. --External carotid artery: No acute abnormality. LEFT CAROTID SYSTEM: --Common carotid artery: Widely patent origin without common carotid artery dissection or aneurysm. --Internal carotid artery: No dissection, occlusion or aneurysm. Mild atherosclerotic calcification at the carotid bifurcation without hemodynamically significant stenosis. --External carotid artery: No acute abnormality. VERTEBRAL ARTERIES: Left dominant configuration. Both origins are clearly patent. No dissection, occlusion or flow-limiting stenosis to the skull base (V1-V3 segments). CTA HEAD FINDINGS POSTERIOR CIRCULATION: --Vertebral arteries: Normal V4 segments. --Posterior inferior cerebellar arteries (PICA): Patent origins from the vertebral arteries. --Anterior inferior cerebellar arteries (AICA): Patent origins from the basilar artery. --Basilar artery: Normal. --Superior cerebellar arteries: Normal. --Posterior cerebral arteries: Normal. Both originate from the basilar artery. Posterior communicating arteries (p-comm) are diminutive or absent. ANTERIOR CIRCULATION: --Intracranial internal carotid arteries: Normal. --Anterior cerebral arteries (ACA): Normal. Both A1 segments are present. Patent anterior communicating artery (a-comm). --Middle cerebral arteries (MCA): Left MCA is occluded at the proximal M1 segment. The occlusion length measures 6 mm. There is moderate collateralization in the left MCA territory. The right MCA is patent and of normal caliber. VENOUS SINUSES: As permitted by contrast timing, patent. ANATOMIC VARIANTS: None Review of the MIP images confirms the above findings. IMPRESSION: 1. Occlusion of the proximal left M1 segment, with moderate collateralization in the left MCA territory. 2. No other intracranial arterial occlusion or high-grade stenosis. 3. Mild bilateral carotid bifurcation atherosclerosis  without hemodynamically significant stenosis by NASCET criteria. 4. Ascending thoracic aortic aneurysm. Recommend semi-annual imaging followup by CTA or MRA and referral to cardiothoracic surgery if not already obtained. This recommendation follows 2010 ACCF/AHA/AATS/ACR/ASA/SCA/SCAI/SIR/STS/SVM Guidelines for the Diagnosis and Management of Patients With Thoracic Aortic Disease. Circulation. 2010; [id number]. Aortic aneurysm NOS (ICD10-I71.9) Aortic Atherosclerosis (ICD10-I70.0). Critical Value/emergent results were called by telephone at the time of interpretation on 03/21/2019 at 11:52 pm to [place] , who verbally acknowledged these results. Electronically Signed   By: Ulyses Jarred M.D.   On: 03/21/2019 23:53   Ct Head Wo Contrast  Result Date: 03/21/2019 CLINICAL DATA:  Status post fall. Difficulty with speech. Alert and oriented. EXAM: CT HEAD WITHOUT CONTRAST TECHNIQUE: Contiguous axial images were obtained from the base of the skull through the vertex without intravenous contrast. COMPARISON:  None. FINDINGS: Brain: No evidence of acute infarction, hemorrhage, extra-axial collection, ventriculomegaly, or mass effect. Generalized cerebral atrophy. Periventricular white matter low attenuation likely secondary to microangiopathy. Vascular: Cerebrovascular atherosclerotic calcifications are noted. Skull: Negative for fracture or focal lesion. Sinuses/Orbits: Visualized portions of the orbits are unremarkable. Left right mastoid mastoid effusion. Sinuses clear. Small bilateral maxillary sinus air-fluid  levels. Mucosal thickening involving the ethmoid sinuses, left sphenoid sinus and right frontoethmoidal recess. Other: None. IMPRESSION: 1. No acute intracranial pathology. Electronically Signed   By: Kathreen Devoid   On: 03/21/2019 19:54   Ct Angio Neck W Or Wo Contrast  Result Date: 03/21/2019 CLINICAL DATA:  Right-sided weakness for 2 days. Slurred speech. EXAM: CT ANGIOGRAPHY HEAD  AND NECK TECHNIQUE: Multidetector CT imaging of the head and neck was performed using the standard protocol during bolus administration of intravenous contrast. Multiplanar CT image reconstructions and MIPs were obtained to evaluate the vascular anatomy. Carotid stenosis measurements (when applicable) are obtained utilizing NASCET criteria, using the distal internal carotid diameter as the denominator. CONTRAST:  162mL OMNIPAQUE IOHEXOL 350 MG/ML SOLN COMPARISON:  Head CT and brain MRI 03/21/2019 FINDINGS: CTA NECK FINDINGS SKELETON: There is no bony spinal canal stenosis. No lytic or blastic lesion. OTHER NECK: Moderate sinus disease. Right suboccipital subcutaneous lipoma. UPPER CHEST: No pneumothorax or pleural effusion. No nodules or masses. AORTIC ARCH: There is mild calcific atherosclerosis of the aortic arch. Ascending thoracic aorta is dilated measuring 4.5 cm. Conventional 3 vessel aortic branching pattern. The visualized proximal subclavian arteries are widely patent. RIGHT CAROTID SYSTEM: --Common carotid artery: Widely patent origin without common carotid artery dissection or aneurysm. --Internal carotid artery: No dissection, occlusion or aneurysm. There is mixed density atherosclerosis extending into the proximal ICA, resulting in less than 50% stenosis. --External carotid artery: No acute abnormality. LEFT CAROTID SYSTEM: --Common carotid artery: Widely patent origin without common carotid artery dissection or aneurysm. --Internal carotid artery: No dissection, occlusion or aneurysm. Mild atherosclerotic calcification at the carotid bifurcation without hemodynamically significant stenosis. --External carotid artery: No acute abnormality. VERTEBRAL ARTERIES: Left dominant configuration. Both origins are clearly patent. No dissection, occlusion or flow-limiting stenosis to the skull base (V1-V3 segments). CTA HEAD FINDINGS POSTERIOR CIRCULATION: --Vertebral arteries: Normal V4 segments. --Posterior  inferior cerebellar arteries (PICA): Patent origins from the vertebral arteries. --Anterior inferior cerebellar arteries (AICA): Patent origins from the basilar artery. --Basilar artery: Normal. --Superior cerebellar arteries: Normal. --Posterior cerebral arteries: Normal. Both originate from the basilar artery. Posterior communicating arteries (p-comm) are diminutive or absent. ANTERIOR CIRCULATION: --Intracranial internal carotid arteries: Normal. --Anterior cerebral arteries (ACA): Normal. Both A1 segments are present. Patent anterior communicating artery (a-comm). --Middle cerebral arteries (MCA): Left MCA is occluded at the proximal M1 segment. The occlusion length measures 6 mm. There is moderate collateralization in the left MCA territory. The right MCA is patent and of normal caliber. VENOUS SINUSES: As permitted by contrast timing, patent. ANATOMIC VARIANTS: None Review of the MIP images confirms the above findings. IMPRESSION: 1. Occlusion of the proximal left M1 segment, with moderate collateralization in the left MCA territory. 2. No other intracranial arterial occlusion or high-grade stenosis. 3. Mild bilateral carotid bifurcation atherosclerosis without hemodynamically significant stenosis by NASCET criteria. 4. Ascending thoracic aortic aneurysm. Recommend semi-annual imaging followup by CTA or MRA and referral to cardiothoracic surgery if not already obtained. This recommendation follows 2010 ACCF/AHA/AATS/ACR/ASA/SCA/SCAI/SIR/STS/SVM Guidelines for the Diagnosis and Management of Patients With Thoracic Aortic Disease. Circulation. 2010; [id number]. Aortic aneurysm NOS (ICD10-I71.9) Aortic Atherosclerosis (ICD10-I70.0). Critical Value/emergent results were called by telephone at the time of interpretation on 03/21/2019 at 11:52 pm to [place] , who verbally acknowledged these results. Electronically Signed   By: Ulyses Jarred M.D.   On: 03/21/2019 23:53   Mr Brain Wo Contrast  (neuro Protocol)  Result Date: 03/21/2019 CLINICAL DATA:  Ataxia, stroke suspected. EXAM: MRI  HEAD WITHOUT CONTRAST TECHNIQUE: Multiplanar, multiecho pulse sequences of the brain and surrounding structures were obtained without intravenous contrast. COMPARISON:  Head CT 03/21/2019 FINDINGS: Multiple sequences are moderately motion degraded. Brain: There are punctate acute to early subacute infarcts involving the left corona radiata and left insula. A chronic microhemorrhage is noted in the posterior left temporal lobe. Patchy T2 hyperintensities in the cerebral white matter bilaterally are nonspecific but compatible with moderate chronic small vessel ischemic disease. There are multiple small chronic infarcts involving the left centrum semiovale and corona radiata in a distribution suggesting deep watershed ischemia. There is mild-to-moderate cerebral atrophy. No mass, midline shift, or extra-axial fluid collection is identified. Vascular: Abnormal appearance of the proximal left MCA. Skull and upper cervical spine: Unremarkable bone marrow signal. Sinuses/Orbits: Unremarkable orbits. Mild-to-moderate mucosal thickening in the paranasal sinuses. Moderate volume left maxillary sinus fluid. Moderate left and trace right mastoid effusions. Other: 4.7 cm right suboccipital lipoma. IMPRESSION: 1. Punctate acute to early subacute infarcts in the left corona radiata and left insula (MCA territory). 2. Moderate chronic small vessel ischemic disease. 3. Multiple chronic white matter infarcts in the left centrum semiovale and corona radiata suggesting deep watershed ischemia and with an abnormal appearance of the proximal left MCA which may reflect occlusion or severely diminished flow. MRA or CTA is recommended for further evaluation. Electronically Signed   By: Logan Bores M.D.   On: 03/21/2019 20:56        Scheduled Meds:  [START ON 03/23/2019] aspirin EC  325 mg Oral Daily   atorvastatin  80 mg Oral q1800    clopidogrel  75 mg Oral Daily   enoxaparin (LOVENOX) injection  40 mg Subcutaneous Q24H   tamsulosin  0.4 mg Oral QPC breakfast   Continuous Infusions:  sodium chloride 100 mL/hr at 03/22/19 0522     LOS: 1 day     Time spent: 30 minutes    Barb Merino, MD [facility] Pager [phone]  If 7PM-7AM, please contact night-coverage [email or web address] Password [email or web address] 03/22/2019, 1:57 PM

## 2019-03-22 NOTE — Progress Notes (Signed)
*  PRELIMINARY RESULTS* Echocardiogram 2D Echocardiogram has been performed.  Samuel Germany 03/22/2019, 2:44 PM

## 2019-03-22 NOTE — Evaluation (Signed)
Clinical/Bedside Swallow Evaluation Patient Details  Name: Stephen Flynn MRN: [id number] Date of Birth: Apr 23, 1928  Today's Date: 03/22/2019 Time: SLP Start Time (ACUTE ONLY): 0930 SLP Stop Time (ACUTE ONLY): 0940 SLP Time Calculation (min) (ACUTE ONLY): 10 min  Past Medical History:  Past Medical History:  Diagnosis Date  . Anemia   . Aortic insufficiency   . Chest pain   . H/O: GI bleed   . Hyperlipidemia   . Hypertension   . Leg cramps    Past Surgical History:  Past Surgical History:  Procedure Laterality Date  . CARDIOVASCULAR STRESS TEST  09/04/2005  . US ECHOCARDIOGRAPHY  11/26/2007   ef 55-60%   HPI:  Patient is an 85 y.o. male with PMH: HTN, who presented to hospital with gait abnormality and weakness in right leg that had been going on two days prior to admission to hospital. MRI revealed left corona radiata and insula infarcts and signs of possible deep watershed ischemia with abnormal appearance of proximal left MCA.   Assessment / Plan / Recommendation Clinical Impression  Patient presents with an oropharyngeal swallow that is WFL-WNL and is without overt s/s of  aspiration or penetration, no difficulty or delays with mastication, oral transit or swallow initiation. Voice remained clear throughout assessment. Patient denies any difficulties with swallowing. SLP Visit Diagnosis: Dysphagia, unspecified (R13.10)    Aspiration Risk  No limitations    Diet Recommendation Thin liquid;Regular   Liquid Administration via: Cup;Straw Medication Administration: Whole meds with liquid Supervision: Patient able to self feed Compensations: Minimize environmental distractions;Slow rate Postural Changes: Seated upright at 90 degrees    Other  Recommendations Oral Care Recommendations: Patient independent with oral care;Oral care BID   Follow up Recommendations None      Frequency and Duration   N/A         Prognosis   N/A     Swallow Study   General Date of Onset:  03/22/19 HPI: Patient is an 85 y.o. male with PMH: HTN, who presented to hospital with gait abnormality and weakness in right leg that had been going on two days prior to admission to hospital. MRI revealed left corona radiata and insula infarcts and signs of possible deep watershed ischemia with abnormal appearance of proximal left MCA. Type of Study: Bedside Swallow Evaluation Previous Swallow Assessment: N/A Diet Prior to this Study: Regular;Thin liquids Temperature Spikes Noted: No Respiratory Status: Room air History of Recent Intubation: No Behavior/Cognition: Alert;Cooperative;Pleasant mood Oral Cavity Assessment: Within Functional Limits Oral Care Completed by SLP: No Oral Cavity - Dentition: Adequate natural dentition Vision: Functional for self-feeding Self-Feeding Abilities: Able to feed self Patient Positioning: Upright in bed(patient sitting on EOB) Baseline Vocal Quality: Normal Volitional Cough: Strong Volitional Swallow: Able to elicit    Oral/Motor/Sensory Function Overall Oral Motor/Sensory Function: Mild impairment Facial ROM: Within Functional Limits Facial Symmetry: Abnormal symmetry right(very mild) Facial Strength: Within Functional Limits Facial Sensation: Within Functional Limits Lingual ROM: Within Functional Limits Lingual Symmetry: Within Functional Limits Lingual Strength: Within Functional Limits Lingual Sensation: Within Functional Limits Velum: Within Functional Limits Mandible: Within Functional Limits   Ice Chips Ice chips: Not tested   Thin Liquid Thin Liquid: Within functional limits Presentation: Straw;Self Fed    Nectar Thick Nectar Thick Liquid: Not tested   Honey Thick Honey Thick Liquid: Not tested   Puree Puree: Within functional limits   Solid     Solid: Within functional limits      Dannial Monarch 03/22/2019,1:09 PM  Sonia Baller, MA, CCC-SLP Speech Therapy [facility] Acute Rehab Pager: [phone]

## 2019-03-23 LAB — GLUCOSE, CAPILLARY
Glucose-Capillary: 121 mg/dL — ABNORMAL HIGH (ref 70–99)
Glucose-Capillary: 118 mg/dL — ABNORMAL HIGH (ref 70–99)

## 2019-03-23 MED ORDER — ATORVASTATIN CALCIUM 80 MG PO TABS: 80.0000 mg | ORAL_TABLET | Freq: Every day | ORAL | 2 refills | Status: DC

## 2019-03-23 MED ORDER — LIVING WELL WITH DIABETES BOOK
Freq: Once | Status: DC
  Filled 2019-03-23: qty 1

## 2019-03-23 MED ORDER — CLOPIDOGREL BISULFATE 75 MG PO TABS: 75.0000 mg | ORAL_TABLET | Freq: Every day | ORAL | 2 refills | Status: DC

## 2019-03-23 MED ORDER — ASPIRIN 325 MG PO TBEC: 325.0000 mg | DELAYED_RELEASE_TABLET | Freq: Every day | ORAL | 2 refills | Status: AC

## 2019-03-23 MED ORDER — METFORMIN HCL 500 MG PO TABS: 500.0000 mg | ORAL_TABLET | Freq: Two times a day (BID) | ORAL | 11 refills | Status: DC

## 2019-03-23 NOTE — Evaluation (Signed)
Physical Therapy Evaluation Patient Details Name: Stephen Flynn MRN: [id number] DOB: 03/27/1928 Today's Date: 03/23/2019   History of Present Illness  85 y.o. male with PMH: HTN, who presented to hospital with gait abnormality and weakness in right leg that had been going on two days prior to admission to hospital. MRI revealed left corona radiata and insula infarcts and signs of possible deep watershed ischemia with abnormal appearance of proximal left MCA.    Clinical Impression  PT eval complete. Pt required min guard assist transfers and ambulation 180 feet HHA on left. Pt to discharge home today. All further needs to be met by HHPT. PT signing off.    Follow Up Recommendations Home health PT;Supervision for mobility/OOB    Equipment Recommendations  None recommended by PT    Recommendations for Other Services       Precautions / Restrictions Precautions Precautions: Fall      Mobility  Bed Mobility Overal bed mobility: Modified Independent                Transfers Overall transfer level: Needs assistance Equipment used: None Transfers: Sit to/from Stand Sit to Stand: Min guard         General transfer comment: min guard for safety, increased time to stabilize initial standing balance  Ambulation/Gait Ambulation/Gait assistance: Min guard Gait Distance (Feet): 180 Feet Assistive device: 1 person hand held assist Gait Pattern/deviations: Decreased stance time - right;Step-through pattern Gait velocity: decreased Gait velocity interpretation: 1.31 - 2.62 ft/sec, indicative of limited community ambulator General Gait Details: HHA on L. Pt uses cane at baseline. Mildly unsteady. No LOB.  Stairs            Wheelchair Mobility    Modified Rankin (Stroke Patients Only) Modified Rankin (Stroke Patients Only) Pre-Morbid Rankin Score: No significant disability Modified Rankin: Moderately severe disability     Balance Overall balance assessment: Needs  assistance Sitting-balance support: No upper extremity supported;Feet supported Sitting balance-Leahy Scale: Good     Standing balance support: No upper extremity supported;During functional activity Standing balance-Leahy Scale: Fair                               Pertinent Vitals/Pain Pain Assessment: No/denies pain    Home Living Family/patient expects to be discharged to:: Private residence Living Arrangements: Spouse/significant other Available Help at Discharge: Family;Available 24 hours/day Type of Home: Apartment Home Access: Elevator     Home Layout: One level Home Equipment: Grab bars - tub/shower;Cane - single point      Prior Function Level of Independence: Independent with assistive device(s)         Comments: amb with cane     Hand Dominance   Dominant Hand: Right    Extremity/Trunk Assessment   Upper Extremity Assessment Upper Extremity Assessment: Defer to OT evaluation    Lower Extremity Assessment Lower Extremity Assessment: RLE deficits/detail RLE Deficits / Details: strength symmetrical/WFL; Pt reports RLE feels heavy. RLE Sensation: decreased proprioception       Communication      Cognition Arousal/Alertness: Awake/alert Behavior During Therapy: WFL for tasks assessed/performed Overall Cognitive Status: Impaired/Different from baseline Area of Impairment: Attention;Memory;Safety/judgement                   Current Attention Level: Selective Memory: Decreased short-term memory   Safety/Judgement: Decreased awareness of deficits;Decreased awareness of safety            General Comments  Exercises     Assessment/Plan    PT Assessment All further PT needs can be met in the next venue of care  PT Problem List Decreased mobility;Decreased safety awareness;Decreased activity tolerance;Decreased balance       PT Treatment Interventions      PT Goals (Current goals can be found in the Care Plan  section)  Acute Rehab PT Goals Patient Stated Goal: home today PT Goal Formulation: All assessment and education complete, DC therapy    Frequency     Barriers to discharge        Co-evaluation PT/OT/SLP Co-Evaluation/Treatment: Yes Reason for Co-Treatment: To address functional/ADL transfers PT goals addressed during session: Mobility/safety with mobility;Balance         AM-PAC PT "6 Clicks" Mobility  Outcome Measure Help needed turning from your back to your side while in a flat bed without using bedrails?: None Help needed moving from lying on your back to sitting on the side of a flat bed without using bedrails?: A Little Help needed moving to and from a bed to a chair (including a wheelchair)?: A Little Help needed standing up from a chair using your arms (e.g., wheelchair or bedside chair)?: A Little Help needed to walk in hospital room?: A Little Help needed climbing 3-5 steps with a railing? : A Lot 6 Click Score: 18    End of Session Equipment Utilized During Treatment: Gait belt Activity Tolerance: Patient tolerated treatment well Patient left: in chair;with call bell/phone within reach Nurse Communication: Mobility status PT Visit Diagnosis: Unsteadiness on feet (R26.81)    Time: 8841-6606 PT Time Calculation (min) (ACUTE ONLY): 28 min   Charges:   PT Evaluation $PT Eval Moderate Complexity: 1 Mod          Lorrin Goodell, PT  Office # [phone] Pager [phone]   [name] 03/23/2019, 1:12 PM

## 2019-03-23 NOTE — Care Management (Signed)
Spoke with patient and he is declining [facility].

## 2019-03-23 NOTE — Progress Notes (Signed)
Inpatient Diabetes Program Recommendations  AACE/ADA: New Consensus Statement on Inpatient Glycemic Control (2015)  Target Ranges:  Prepandial:   less than 140 mg/dL      Peak postprandial:   less than 180 mg/dL (1-2 hours)      Critically ill patients:  140 - 180 mg/dL   Lab Results  Component Value Date   GLUCAP 121 (H) 03/23/2019   HGBA1C 7.4 (H) 03/22/2019    Review of Glycemic Control Results for Stephen Flynn, Stephen Flynn (MRN [id number]) as of 03/23/2019 08:23  Ref. Range 03/22/2019 16:53 03/22/2019 21:36 03/23/2019 06:21  Glucose-Capillary Latest Ref Range: 70 - 99 mg/dL 110 (H) 147 (H) 121 (H)   Diabetes history: New onset DM Outpatient Diabetes medications: none Current orders for Inpatient glycemic control: none  Inpatient Diabetes Program Recommendations:    Noted consult.   Spoke with patient regarding new onset DM.  Reviewed patient's current A1c of 7.4%. Explained what a A1c is and what it measures. Also reviewed goal A1c with patient, importance of good glucose control @ home, and blood sugar goals. Reviewed patho of DM, role of pancreas, vascular changes and commorbidities. Patient admits to drinking large amounts of sweet tea. Reviewed alternatives and encouraged to be mindful when selecting beverages/food that are higher in carbohydrates.  Patient plans to follow up with PCP in 2-3 weeks following hospitalization. Will order [facility] for patient to review. Has no further questions at this time.   Thanks, Bronson Curb, MSN, RNC-OB Diabetes Coordinator [phone] (8a-5p)

## 2019-03-23 NOTE — Evaluation (Signed)
Occupational Therapy Evaluation Patient Details Name: Stephen Flynn MRN: [id number] DOB: 05-20-1928 Today's Date: 03/23/2019    History of Present Illness 85 y.o. male with PMH: HTN, who presented to hospital with gait abnormality and weakness in right leg that had been going on two days prior to admission to hospital. MRI revealed left corona radiata and insula infarcts and signs of possible deep watershed ischemia with abnormal appearance of proximal left MCA.   Clinical Impression   Pt admitted with the above listed diagnosis, and demonstrates the below listed deficits.  He demonstrates impaired cognition and impaired balance.  He currently requires min guard assist for ADLs and functional mobility .  Pt reports he lives with wife, who can assist as needed. Recommend HHOT.  Pt for discharge home today - all further OT needs can be addressed by [place]     Follow Up Recommendations  Home health OT;Supervision - Intermittent    Equipment Recommendations  None recommended by OT    Recommendations for Other Services       Precautions / Restrictions Precautions Precautions: Fall      Mobility Bed Mobility Overal bed mobility: Modified Independent             General bed mobility comments: Pt sitting up in chair   Transfers Overall transfer level: Needs assistance Equipment used: None Transfers: Sit to/from Stand Sit to Stand: Min guard Stand pivot transfers: Min guard       General transfer comment: min guard for safety, increased time to stabilize initial standing balance    Balance Overall balance assessment: Needs assistance Sitting-balance support: No upper extremity supported;Feet supported Sitting balance-Leahy Scale: Good Sitting balance - Comments: able to don socks without LOB    Standing balance support: No upper extremity supported;During functional activity Standing balance-Leahy Scale: Fair Standing balance comment: able to maintain static standing with  min guard assist                            ADL either performed or assessed with clinical judgement   ADL Overall ADL's : Needs assistance/impaired Eating/Feeding: Independent   Grooming: Wash/dry hands;Wash/dry face;Oral care;Brushing hair;Min guard;Standing   Upper Body Bathing: Supervision/ safety;Set up;Sitting   Lower Body Bathing: Min guard;Sit to/from stand   Upper Body Dressing : Set up;Sitting   Lower Body Dressing: Min guard;Sit to/from stand   Toilet Transfer: Min guard;Ambulation;Comfort height toilet;Grab bars;RW   Toileting- Water quality scientist and Hygiene: Min guard;Sit to/from stand   Tub/ Shower Transfer: Tub transfer;Min guard;Ambulation Tub/Shower Transfer Details (indicate cue type and reason): simulated shower seat  Functional mobility during ADLs: Min guard General ADL Comments: discussed options of 3in1 commode vs toilet riser with pt.  Also discussed recommendation for tub seat, but pt reports tub is too small.  Recommend wife supervise him with shower transfers and defer recommendation of tub DME to [facility] therapies      Vision Baseline Vision/History: No visual deficits Patient Visual Report: No change from baseline Vision Assessment?: No apparent visual deficits     Perception     Praxis Praxis Praxis tested?: Within functional limits    Pertinent Vitals/Pain Pain Assessment: No/denies pain     Hand Dominance Right   Extremity/Trunk Assessment Upper Extremity Assessment Upper Extremity Assessment: Defer to OT evaluation   Lower Extremity Assessment Lower Extremity Assessment: RLE deficits/detail RLE Deficits / Details: strength symmetrical/WFL; Pt reports RLE feels heavy. RLE Sensation: decreased proprioception   Cervical /  Trunk Assessment Cervical / Trunk Assessment: Normal   Communication Communication Communication: No difficulties   Cognition Arousal/Alertness: Awake/alert Behavior During Therapy: WFL for tasks  assessed/performed Overall Cognitive Status: Impaired/Different from baseline Area of Impairment: Attention;Memory;Safety/judgement                   Current Attention Level: Selective Memory: Decreased short-term memory   Safety/Judgement: Decreased awareness of deficits;Decreased awareness of safety Awareness: Intellectual Problem Solving: Requires verbal cues;Requires tactile cues General Comments: Short Blessed Test was administered.  Pt scored 6/28 demonstrating deficits with recall. He self distracts during eval requiring mod cues for redirection to task    General Comments       Exercises     Shoulder Instructions      Home Living Family/patient expects to be discharged to:: Private residence Living Arrangements: Spouse/significant other Available Help at Discharge: Family;Available 24 hours/day Type of Home: Apartment Home Access: Elevator     Home Layout: One level     Bathroom Shower/Tub: Tub/shower unit;Curtain   Biochemist, clinical: Standard     Home Equipment: Grab bars - tub/shower;Kasandra Knudsen - single point      Lives With: Spouse    Prior Functioning/Environment Level of Independence: Independent with assistive device(s)        Comments: amb with cane        OT Problem List: Decreased strength;Decreased activity tolerance;Impaired balance (sitting and/or standing);Decreased cognition;Decreased safety awareness;Decreased knowledge of use of DME or AE      OT Treatment/Interventions:      OT Goals(Current goals can be found in the care plan section) Acute Rehab OT Goals Patient Stated Goal: home today OT Goal Formulation: All assessment and education complete, DC therapy  OT Frequency:     Barriers to D/C:            Co-evaluation PT/OT/SLP Co-Evaluation/Treatment: Yes Reason for Co-Treatment: To address functional/ADL transfers PT goals addressed during session: Mobility/safety with mobility;Balance OT goals addressed during session:  ADL's and self-care      AM-PAC OT "6 Clicks" Daily Activity     Outcome Measure Help from another person eating meals?: None Help from another person taking care of personal grooming?: A Little Help from another person toileting, which includes using toliet, bedpan, or urinal?: A Little Help from another person bathing (including washing, rinsing, drying)?: A Little Help from another person to put on and taking off regular upper body clothing?: A Little Help from another person to put on and taking off regular lower body clothing?: A Little 6 Click Score: 19   End of Session Equipment Utilized During Treatment: Gait belt Nurse Communication: Mobility status  Activity Tolerance: Patient tolerated treatment well Patient left: in chair;with call bell/phone within reach  OT Visit Diagnosis: Unsteadiness on feet (R26.81);Cognitive communication deficit (R41.841) Symptoms and signs involving cognitive functions: Cerebral infarction                Time: [id number] OT Time Calculation (min): 32 min Charges:  OT General Charges $OT Visit: 1 Visit OT Evaluation $OT Eval Moderate Complexity: 1 Mod  Lucille Passy, OTR/L Acute Rehabilitation Services Pager [phone] Office [phone]   Lucille Passy M 03/23/2019, 1:37 PM

## 2019-03-23 NOTE — Progress Notes (Addendum)
Discharge instructions given to patient, with wife at bedside. Patient belongings given to wife. IV removed and telemetry discontinued. Patient transported off unit via wheelchair to son's car. Gwendolyn Grant, RN

## 2019-03-23 NOTE — Discharge Summary (Signed)
Physician Discharge Summary  Stephen Flynn [id number] DOB: 10-05-1927 DOA: 03/21/2019  PCP: [name], MD  Admit date: 03/21/2019 Discharge date: 03/23/2019  Admitted From: Home Disposition: Home  Recommendations for Outpatient Follow-up:  1. Follow up with PCP in 1-2 weeks 2. Follow-up with neurology, office will schedule follow-up.  Home Health: Declined Equipment/Devices: Declined  Discharge Condition: Stable CODE STATUS: Full code Diet recommendation: Low-salt diet  Brief/Interim Summary: 85 year old male with history of hypertension, hyperlipidemia who presented to the emergency room with right-sided weakness and dysarthria for 2 days.  He apparently was having some problem on the right hip walking and he thought that is from hip pain for about a month.  In the emergency room he was found with mild dysarthria and right hemiparesis.  Patient was not a TPA candidate with symptoms onset more than 48 hours.  He was admitted to the hospital for a stroke work-up.  Discharge Diagnoses:  Principal Problem:   Ischemic stroke [facility]) Active Problems:   Essential hypertension   BPH (benign prostatic hyperplasia)  Acute ischemic left MCA territory stroke: With dysarthria and right facial weakness and minimal right lower extremity weakness.  Initial CT scan normal.  MRI of the brain showed punctate acute to early subacute infarcts of the left corona radiata and left insula on left MCA territory.  CTA of the head and neck showed occlusion of proximal left M1 segment thought to be chronic.  2D echocardiogram was normal.  LDL 65.  Hemoglobin A1c 7.4. Patient on aspirin 81 mg at home, neurology recommended aspirin 325 mg daily along with Plavix 75 mg daily for 3 months then Plavix alone due to intracranial stenosis. Lipitor 80 mg daily. Hemoglobin A1c 7.4, dietary modifications advised, seen by diabetic educator, start on metformin. Blood pressures are stable.  He will resume amlodipine.  Will  avoid sudden drop in blood pressure, his goal will be 130-140s. Seen by PT OT and speech in the hospital.  Recommended PT OT speech at home with home health and patient declined.  He went home with his wife.   Discharge Instructions  Discharge Instructions    Ambulatory referral to Neurology   Complete by: As directed    Follow up with Dr. Leonie Flynn at [facility] in 4-6 weeks. Too complicated for NP to follow. Thanks.   Diet Carb Modified   Complete by: As directed    Increase activity slowly   Complete by: As directed      Allergies as of 03/23/2019      Reactions   Penicillins Hives   Did it involve swelling of the face/tongue/throat, SOB, or low BP? No Did it involve sudden or severe rash/hives, skin peeling, or any reaction on the inside of your mouth or nose? yes Did you need to seek medical attention at a hospital or doctor's office? yes When did it last happen?child If all above answers are "NO", may proceed with cephalosporin use.      Medication List    STOP taking these medications   aspirin 81 MG tablet Replaced by: aspirin 325 MG EC tablet     TAKE these medications   amLODipine 5 MG tablet Commonly known as: NORVASC Take 5 mg by mouth daily.   aspirin 325 MG EC tablet Take 1 tablet (325 mg total) by mouth daily. Start taking on: March 24, 2019 Replaces: aspirin 81 MG tablet   atorvastatin 80 MG tablet Commonly known as: LIPITOR Take 1 tablet (80 mg total) by mouth daily at 6  PM. Notes to patient: 03/23/19   clopidogrel 75 MG tablet Commonly known as: PLAVIX Take 1 tablet (75 mg total) by mouth daily. Start taking on: March 24, 2019   metFORMIN 500 MG tablet Commonly known as: Glucophage Take 1 tablet (500 mg total) by mouth 2 (two) times daily with a meal. Notes to patient: 03/24/19   SYSTANE FREE OP Place 1 drop into both eyes daily as needed.   tamsulosin 0.4 MG Caps capsule Commonly known as: FLOMAX Take 0.4 mg by mouth. Notes to  patient: 03/24/19      Follow-up Information    Garvin Fila, MD. Schedule an appointment as soon as possible for a visit in 4 week(s).   Specialties: Neurology, Radiology Contact information: [address] [place] [address] [phone]          Allergies  Allergen Reactions  . Penicillins Hives    Did it involve swelling of the face/tongue/throat, SOB, or low BP? No Did it involve sudden or severe rash/hives, skin peeling, or any reaction on the inside of your mouth or nose? yes Did you need to seek medical attention at a hospital or doctor's office? yes When did it last happen?child If all above answers are "NO", may proceed with cephalosporin use.    Consultations:  Neurology   Procedures/Studies: Ct Angio Head W Or Wo Contrast  Result Date: 03/21/2019 CLINICAL DATA:  Right-sided weakness for 2 days. Slurred speech. EXAM: CT ANGIOGRAPHY HEAD AND NECK TECHNIQUE: Multidetector CT imaging of the head and neck was performed using the standard protocol during bolus administration of intravenous contrast. Multiplanar CT image reconstructions and MIPs were obtained to evaluate the vascular anatomy. Carotid stenosis measurements (when applicable) are obtained utilizing NASCET criteria, using the distal internal carotid diameter as the denominator. CONTRAST:  114mL OMNIPAQUE IOHEXOL 350 MG/ML SOLN COMPARISON:  Head CT and brain MRI 03/21/2019 FINDINGS: CTA NECK FINDINGS SKELETON: There is no bony spinal canal stenosis. No lytic or blastic lesion. OTHER NECK: Moderate sinus disease. Right suboccipital subcutaneous lipoma. UPPER CHEST: No pneumothorax or pleural effusion. No nodules or masses. AORTIC ARCH: There is mild calcific atherosclerosis of the aortic arch. Ascending thoracic aorta is dilated measuring 4.5 cm. Conventional 3 vessel aortic branching pattern. The visualized proximal subclavian arteries are widely patent. RIGHT CAROTID SYSTEM: --Common carotid  artery: Widely patent origin without common carotid artery dissection or aneurysm. --Internal carotid artery: No dissection, occlusion or aneurysm. There is mixed density atherosclerosis extending into the proximal ICA, resulting in less than 50% stenosis. --External carotid artery: No acute abnormality. LEFT CAROTID SYSTEM: --Common carotid artery: Widely patent origin without common carotid artery dissection or aneurysm. --Internal carotid artery: No dissection, occlusion or aneurysm. Mild atherosclerotic calcification at the carotid bifurcation without hemodynamically significant stenosis. --External carotid artery: No acute abnormality. VERTEBRAL ARTERIES: Left dominant configuration. Both origins are clearly patent. No dissection, occlusion or flow-limiting stenosis to the skull base (V1-V3 segments). CTA HEAD FINDINGS POSTERIOR CIRCULATION: --Vertebral arteries: Normal V4 segments. --Posterior inferior cerebellar arteries (PICA): Patent origins from the vertebral arteries. --Anterior inferior cerebellar arteries (AICA): Patent origins from the basilar artery. --Basilar artery: Normal. --Superior cerebellar arteries: Normal. --Posterior cerebral arteries: Normal. Both originate from the basilar artery. Posterior communicating arteries (p-comm) are diminutive or absent. ANTERIOR CIRCULATION: --Intracranial internal carotid arteries: Normal. --Anterior cerebral arteries (ACA): Normal. Both A1 segments are present. Patent anterior communicating artery (a-comm). --Middle cerebral arteries (MCA): Left MCA is occluded at the proximal M1 segment. The occlusion length  measures 6 mm. There is moderate collateralization in the left MCA territory. The right MCA is patent and of normal caliber. VENOUS SINUSES: As permitted by contrast timing, patent. ANATOMIC VARIANTS: None Review of the MIP images confirms the above findings. IMPRESSION: 1. Occlusion of the proximal left M1 segment, with moderate collateralization in the  left MCA territory. 2. No other intracranial arterial occlusion or high-grade stenosis. 3. Mild bilateral carotid bifurcation atherosclerosis without hemodynamically significant stenosis by NASCET criteria. 4. Ascending thoracic aortic aneurysm. Recommend semi-annual imaging followup by CTA or MRA and referral to cardiothoracic surgery if not already obtained. This recommendation follows 2010 ACCF/AHA/AATS/ACR/ASA/SCA/SCAI/SIR/STS/SVM Guidelines for the Diagnosis and Management of Patients With Thoracic Aortic Disease. Circulation. 2010; [id number]. Aortic aneurysm NOS (ICD10-I71.9) Aortic Atherosclerosis (ICD10-I70.0). Critical Value/emergent results were called by telephone at the time of interpretation on 03/21/2019 at 11:52 pm to [place] , who verbally acknowledged these results. Electronically Signed   By: Ulyses Jarred M.D.   On: 03/21/2019 23:53   Ct Head Wo Contrast  Result Date: 03/21/2019 CLINICAL DATA:  Status post fall. Difficulty with speech. Alert and oriented. EXAM: CT HEAD WITHOUT CONTRAST TECHNIQUE: Contiguous axial images were obtained from the base of the skull through the vertex without intravenous contrast. COMPARISON:  None. FINDINGS: Brain: No evidence of acute infarction, hemorrhage, extra-axial collection, ventriculomegaly, or mass effect. Generalized cerebral atrophy. Periventricular white matter low attenuation likely secondary to microangiopathy. Vascular: Cerebrovascular atherosclerotic calcifications are noted. Skull: Negative for fracture or focal lesion. Sinuses/Orbits: Visualized portions of the orbits are unremarkable. Left right mastoid mastoid effusion. Sinuses clear. Small bilateral maxillary sinus air-fluid levels. Mucosal thickening involving the ethmoid sinuses, left sphenoid sinus and right frontoethmoidal recess. Other: None. IMPRESSION: 1. No acute intracranial pathology. Electronically Signed   By: Kathreen Devoid   On: 03/21/2019 19:54   Ct Angio  Neck W Or Wo Contrast  Result Date: 03/21/2019 CLINICAL DATA:  Right-sided weakness for 2 days. Slurred speech. EXAM: CT ANGIOGRAPHY HEAD AND NECK TECHNIQUE: Multidetector CT imaging of the head and neck was performed using the standard protocol during bolus administration of intravenous contrast. Multiplanar CT image reconstructions and MIPs were obtained to evaluate the vascular anatomy. Carotid stenosis measurements (when applicable) are obtained utilizing NASCET criteria, using the distal internal carotid diameter as the denominator. CONTRAST:  11mL OMNIPAQUE IOHEXOL 350 MG/ML SOLN COMPARISON:  Head CT and brain MRI 03/21/2019 FINDINGS: CTA NECK FINDINGS SKELETON: There is no bony spinal canal stenosis. No lytic or blastic lesion. OTHER NECK: Moderate sinus disease. Right suboccipital subcutaneous lipoma. UPPER CHEST: No pneumothorax or pleural effusion. No nodules or masses. AORTIC ARCH: There is mild calcific atherosclerosis of the aortic arch. Ascending thoracic aorta is dilated measuring 4.5 cm. Conventional 3 vessel aortic branching pattern. The visualized proximal subclavian arteries are widely patent. RIGHT CAROTID SYSTEM: --Common carotid artery: Widely patent origin without common carotid artery dissection or aneurysm. --Internal carotid artery: No dissection, occlusion or aneurysm. There is mixed density atherosclerosis extending into the proximal ICA, resulting in less than 50% stenosis. --External carotid artery: No acute abnormality. LEFT CAROTID SYSTEM: --Common carotid artery: Widely patent origin without common carotid artery dissection or aneurysm. --Internal carotid artery: No dissection, occlusion or aneurysm. Mild atherosclerotic calcification at the carotid bifurcation without hemodynamically significant stenosis. --External carotid artery: No acute abnormality. VERTEBRAL ARTERIES: Left dominant configuration. Both origins are clearly patent. No dissection, occlusion or flow-limiting  stenosis to the skull base (V1-V3 segments). CTA HEAD FINDINGS POSTERIOR CIRCULATION: --Vertebral arteries: Normal  V4 segments. --Posterior inferior cerebellar arteries (PICA): Patent origins from the vertebral arteries. --Anterior inferior cerebellar arteries (AICA): Patent origins from the basilar artery. --Basilar artery: Normal. --Superior cerebellar arteries: Normal. --Posterior cerebral arteries: Normal. Both originate from the basilar artery. Posterior communicating arteries (p-comm) are diminutive or absent. ANTERIOR CIRCULATION: --Intracranial internal carotid arteries: Normal. --Anterior cerebral arteries (ACA): Normal. Both A1 segments are present. Patent anterior communicating artery (a-comm). --Middle cerebral arteries (MCA): Left MCA is occluded at the proximal M1 segment. The occlusion length measures 6 mm. There is moderate collateralization in the left MCA territory. The right MCA is patent and of normal caliber. VENOUS SINUSES: As permitted by contrast timing, patent. ANATOMIC VARIANTS: None Review of the MIP images confirms the above findings. IMPRESSION: 1. Occlusion of the proximal left M1 segment, with moderate collateralization in the left MCA territory. 2. No other intracranial arterial occlusion or high-grade stenosis. 3. Mild bilateral carotid bifurcation atherosclerosis without hemodynamically significant stenosis by NASCET criteria. 4. Ascending thoracic aortic aneurysm. Recommend semi-annual imaging followup by CTA or MRA and referral to cardiothoracic surgery if not already obtained. This recommendation follows 2010 ACCF/AHA/AATS/ACR/ASA/SCA/SCAI/SIR/STS/SVM Guidelines for the Diagnosis and Management of Patients With Thoracic Aortic Disease. Circulation. 2010; [id number]. Aortic aneurysm NOS (ICD10-I71.9) Aortic Atherosclerosis (ICD10-I70.0). Critical Value/emergent results were called by telephone at the time of interpretation on 03/21/2019 at 11:52 pm to [place] , who verbally acknowledged these results. Electronically Signed   By: Ulyses Jarred M.D.   On: 03/21/2019 23:53   Mr Brain Wo Contrast (neuro Protocol)  Result Date: 03/21/2019 CLINICAL DATA:  Ataxia, stroke suspected. EXAM: MRI HEAD WITHOUT CONTRAST TECHNIQUE: Multiplanar, multiecho pulse sequences of the brain and surrounding structures were obtained without intravenous contrast. COMPARISON:  Head CT 03/21/2019 FINDINGS: Multiple sequences are moderately motion degraded. Brain: There are punctate acute to early subacute infarcts involving the left corona radiata and left insula. A chronic microhemorrhage is noted in the posterior left temporal lobe. Patchy T2 hyperintensities in the cerebral white matter bilaterally are nonspecific but compatible with moderate chronic small vessel ischemic disease. There are multiple small chronic infarcts involving the left centrum semiovale and corona radiata in a distribution suggesting deep watershed ischemia. There is mild-to-moderate cerebral atrophy. No mass, midline shift, or extra-axial fluid collection is identified. Vascular: Abnormal appearance of the proximal left MCA. Skull and upper cervical spine: Unremarkable bone marrow signal. Sinuses/Orbits: Unremarkable orbits. Mild-to-moderate mucosal thickening in the paranasal sinuses. Moderate volume left maxillary sinus fluid. Moderate left and trace right mastoid effusions. Other: 4.7 cm right suboccipital lipoma. IMPRESSION: 1. Punctate acute to early subacute infarcts in the left corona radiata and left insula (MCA territory). 2. Moderate chronic small vessel ischemic disease. 3. Multiple chronic white matter infarcts in the left centrum semiovale and corona radiata suggesting deep watershed ischemia and with an abnormal appearance of the proximal left MCA which may reflect occlusion or severely diminished flow. MRA or CTA is recommended for further evaluation. Electronically Signed   By: Logan Bores  M.D.   On: 03/21/2019 20:56       Subjective: Patient seen and examined.  No overnight events.  Examined in the evening for discharge readiness.  Wife at the bedside. Patient has been doing well.  He says he does not need home PT OT, he will do therapy himself in his apartment, he will walk.   Discharge Exam: Vitals:   03/23/19 0817 03/23/19 1304  BP: (!) 161/106 (!) 147/104  Pulse: 66 67  Resp:  15 18  Temp: 98.6 F (37 C) 97.6 F (36.4 C)  SpO2: 98% 99%   Vitals:   03/22/19 2333 03/23/19 0355 03/23/19 0817 03/23/19 1304  BP: (!) 155/96 (!) 175/104 (!) 161/106 (!) 147/104  Pulse: 65 65 66 67  Resp: 16 18 15 18   Temp: 98.9 F (37.2 C) 98.8 F (37.1 C) 98.6 F (37 C) 97.6 F (36.4 C)  TempSrc: Oral Oral Oral Oral  SpO2: 97% 98% 98% 99%  Weight:      Height:        General: Pt is alert, awake, not in acute distress Cardiovascular: RRR, S1/S2 +, no rubs, no gallops Respiratory: CTA bilaterally, no wheezing, no rhonchi Abdominal: Soft, NT, ND, bowel sounds + Extremities: no edema, no cyanosis Patient does not have any gross neurological deficit.  He has mild right facial drooping and mild dysarthria.    The results of significant diagnostics from this hospitalization (including imaging, microbiology, ancillary and laboratory) are listed below for reference.     Microbiology: Recent Results (from the past 240 hour(s))  SARS CORONAVIRUS 2 (TAT 6-24 HRS) Nasopharyngeal Nasopharyngeal Swab     Status: None   Collection Time: 03/21/19  8:48 PM   Specimen: Nasopharyngeal Swab  Result Value Ref Range Status   SARS Coronavirus 2 NEGATIVE NEGATIVE Final    Comment: (NOTE) SARS-CoV-2 target nucleic acids are NOT DETECTED. The SARS-CoV-2 RNA is generally detectable in upper and lower respiratory specimens during the acute phase of infection. Negative results do not preclude SARS-CoV-2 infection, do not rule out co-infections with other pathogens, and should not be used as  the sole basis for treatment or other patient management decisions. Negative results must be combined with clinical observations, patient history, and epidemiological information. The expected result is Negative. Fact Sheet for Patients: SugarRoll.be Fact Sheet for Healthcare Providers: https://www.woods-mathews.com/ This test is not yet approved or cleared by the [place] FDA and  has been authorized for detection and/or diagnosis of SARS-CoV-2 by FDA under an Emergency Use Authorization (EUA). This EUA will remain  in effect (meaning this test can be used) for the duration of the COVID-19 declaration under Section 56 4(b)(1) of the Act, 21 U.S.C. section 360bbb-3(b)(1), unless the authorization is terminated or revoked sooner. Performed at [facility], [place] [address] [place], [place] [address]      Labs: BNP (last 3 results) No results for input(s): BNP in the last 8760 hours. Basic Metabolic Panel: Recent Labs  Lab 03/21/19 1724 03/22/19 0408  NA 137  --   K 4.2  --   CL 103  --   CO2 27  --   GLUCOSE 113*  --   BUN 12  --   CREATININE 0.95 0.90  CALCIUM 9.5  --    Liver Function Tests: Recent Labs  Lab 03/21/19 1724  AST 16  ALT 16  ALKPHOS 64  BILITOT 0.3  PROT 7.4  ALBUMIN 3.8   No results for input(s): LIPASE, AMYLASE in the last 168 hours. No results for input(s): AMMONIA in the last 168 hours. CBC: Recent Labs  Lab 03/21/19 1724 03/22/19 0408  WBC 4.4 4.1  NEUTROABS 1.6*  --   HGB 11.7* 11.6*  HCT 35.2* 36.3*  MCV 95.7 95.3  PLT 167 184   Cardiac Enzymes: No results for input(s): CKTOTAL, CKMB, CKMBINDEX, TROPONINI in the last 168 hours. BNP: Invalid input(s): POCBNP CBG: Recent Labs  Lab 03/22/19 1244 03/22/19 1653 03/22/19 2136 03/23/19 [id number]  03/23/19 1319  GLUCAP 171* 110* 147* 121* 118*   D-Dimer No results for input(s): DDIMER in the last 72 hours. Hgb A1c Recent Labs     03/22/19 0408  HGBA1C 7.4*   Lipid Profile Recent Labs    03/22/19 0408  CHOL 116  HDL 40*  LDLCALC 65  TRIG 55  CHOLHDL 2.9   Thyroid function studies Recent Labs    03/22/19 0408  TSH 1.258   Anemia work up Recent Labs    03/22/19 0408  VITAMINB12 461  FERRITIN 97  TIBC 252  IRON 59   Urinalysis    Component Value Date/Time   COLORURINE STRAW (A) 03/21/2019 1736   APPEARANCEUR CLEAR 03/21/2019 1736   LABSPEC 1.004 (L) 03/21/2019 1736   PHURINE 7.0 03/21/2019 1736   GLUCOSEU NEGATIVE 03/21/2019 1736   HGBUR NEGATIVE 03/21/2019 1736   BILIRUBINUR NEGATIVE 03/21/2019 1736   KETONESUR NEGATIVE 03/21/2019 1736   PROTEINUR NEGATIVE 03/21/2019 1736   NITRITE NEGATIVE 03/21/2019 1736   LEUKOCYTESUR NEGATIVE 03/21/2019 1736   Sepsis Labs Invalid input(s): PROCALCITONIN,  WBC,  LACTICIDVEN Microbiology Recent Results (from the past 240 hour(s))  SARS CORONAVIRUS 2 (TAT 6-24 HRS) Nasopharyngeal Nasopharyngeal Swab     Status: None   Collection Time: 03/21/19  8:48 PM   Specimen: Nasopharyngeal Swab  Result Value Ref Range Status   SARS Coronavirus 2 NEGATIVE NEGATIVE Final    Comment: (NOTE) SARS-CoV-2 target nucleic acids are NOT DETECTED. The SARS-CoV-2 RNA is generally detectable in upper and lower respiratory specimens during the acute phase of infection. Negative results do not preclude SARS-CoV-2 infection, do not rule out co-infections with other pathogens, and should not be used as the sole basis for treatment or other patient management decisions. Negative results must be combined with clinical observations, patient history, and epidemiological information. The expected result is Negative. Fact Sheet for Patients: SugarRoll.be Fact Sheet for Healthcare Providers: https://www.woods-mathews.com/ This test is not yet approved or cleared by the [place] FDA and  has been authorized for detection and/or  diagnosis of SARS-CoV-2 by FDA under an Emergency Use Authorization (EUA). This EUA will remain  in effect (meaning this test can be used) for the duration of the COVID-19 declaration under Section 56 4(b)(1) of the Act, 21 U.S.C. section 360bbb-3(b)(1), unless the authorization is terminated or revoked sooner. Performed at [facility], [place] [address] [place], [place] [address]      Time coordinating discharge: 35 minutes  SIGNED:   Barb Merino, MD  [facility] 03/23/2019, 2:52 PM

## 2019-03-24 ENCOUNTER — Telehealth: Payer: Self-pay

## 2019-03-24 LAB — FOLATE RBC
Folate, Hemolysate: 380 ng/mL
Folate, RBC: 1111 ng/mL (ref >498)
Hematocrit: 34.2 % — ABNORMAL LOW (ref 37.5–51.0)

## 2019-03-24 NOTE — Telephone Encounter (Signed)
Pt called wanting a hosp f/u w/ Dr.Penumalli. Transfered over to referrals.

## 2019-03-24 NOTE — Progress Notes (Signed)
PSA 24 on admission labs. He does have BPH and probably prostate cancer. He has Deal[facility] at [place]. Called , updated labs and advised him to make an appointment with [facility] . Patient and wife voiced understanding.

## 2019-03-31 DIAGNOSIS — E785 Hyperlipidemia, unspecified: Secondary | ICD-10-CM | POA: Diagnosis not present

## 2019-03-31 DIAGNOSIS — R4701 Aphasia: Secondary | ICD-10-CM | POA: Diagnosis not present

## 2019-03-31 DIAGNOSIS — I6990 Unspecified sequelae of unspecified cerebrovascular disease: Secondary | ICD-10-CM | POA: Insufficient documentation

## 2019-03-31 DIAGNOSIS — I700 Atherosclerosis of aorta: Secondary | ICD-10-CM | POA: Insufficient documentation

## 2019-03-31 DIAGNOSIS — R2689 Other abnormalities of gait and mobility: Secondary | ICD-10-CM | POA: Diagnosis not present

## 2019-03-31 DIAGNOSIS — D509 Iron deficiency anemia, unspecified: Secondary | ICD-10-CM | POA: Diagnosis not present

## 2019-03-31 DIAGNOSIS — C61 Malignant neoplasm of prostate: Secondary | ICD-10-CM | POA: Diagnosis not present

## 2019-03-31 DIAGNOSIS — I10 Essential (primary) hypertension: Secondary | ICD-10-CM | POA: Diagnosis not present

## 2019-03-31 DIAGNOSIS — E119 Type 2 diabetes mellitus without complications: Secondary | ICD-10-CM | POA: Diagnosis not present

## 2019-03-31 DIAGNOSIS — I712 Thoracic aortic aneurysm, without rupture, unspecified: Secondary | ICD-10-CM | POA: Insufficient documentation

## 2019-03-31 DIAGNOSIS — R269 Unspecified abnormalities of gait and mobility: Secondary | ICD-10-CM | POA: Insufficient documentation

## 2019-04-02 ENCOUNTER — Other Ambulatory Visit: Payer: Self-pay

## 2019-04-02 NOTE — Patient Outreach (Addendum)
[facility]) Care Management  [place]  04/02/2019   Stephen Flynn 11-26-1927 [id number]    Transition of Care Referral  Referral Date: 04/02/2019 Referral [place] Discharge Report Date of Admission: 03/21/2019 Diagnosis: CVA Date of Discharge: 03/23/2019 Facility: [place] Medicare PCP does [facility]    Outreach attempt # 1 to patient. Spoke with patient who voiced he was doing fairly well since returning home. He has supportive spouse in the home. He also states that his children check on him and assist him as needed. Patient complains of still having some weakness to affected stroke side which is causing him to not be able to write clearly. RN CM reassured patient that this is normal following a stroke and hopefully as he regains strength he will see improvement. He denies any pain. He has been using his cane to assist with ambulation as needed. He voices that his only fall this year was his fall that led to admission. He reports that his daughter normally takes him to appts.   Conditions: Per chart review, patient has PMH of  HTN, BPH, DM and HLD. He voices that he does not monitor cbgs in the home as MD has told him that it was " not bad enough yet." Per records, last A1C was 7.4(Sept 2020). He states that he is adhering to low salt/low sugar diet as much as possible. Appetite fair. He has scale in the home and weighs periodically. He also states that he monitors BP in the home. BP readings since discharge have been in the 130's-150's. Patient states he has not seen a dentist in over a year due to no dental coverage. He is agreeable to [facility] SW referral for possible dental resources.   Appointments: Patient states he saw PCP on Monday of this week. He has an appt with urology in November. RN CM reviewed with pt. that per d/c instructions he is to follow up with neuro as well. Patient stets he had forgotten about that. RN CM confirmed that  patient has MD office contact info and he will call office to arrange appt.   Medications: RN CM completed med review with patient. No issues noted. He states that his children normally fills med planner weekly for him. He denies any issues at present affording meds.   Encounter Medications:  Outpatient Encounter Medications as of 04/02/2019  Medication Sig  . amLODipine (NORVASC) 5 MG tablet Take 5 mg by mouth daily.  [name] Kitchen aspirin EC 325 MG EC tablet Take 1 tablet (325 mg total) by mouth daily.  [name] Kitchen atorvastatin (LIPITOR) 80 MG tablet Take 1 tablet (80 mg total) by mouth daily at 6 PM.  . clopidogrel (PLAVIX) 75 MG tablet Take 1 tablet (75 mg total) by mouth daily.  . metFORMIN (GLUCOPHAGE) 500 MG tablet Take 1 tablet (500 mg total) by mouth 2 (two) times daily with a meal.  . Polyethyl Glycol-Propyl Glycol (SYSTANE FREE OP) Place 1 drop into both eyes daily as needed.  . tamsulosin (FLOMAX) 0.4 MG CAPS capsule Take 0.4 mg by mouth.   No facility-administered encounter medications on file as of 04/02/2019.     Functional Status:  In your present state of health, do you have any difficulty performing the following activities: 04/02/2019  Hearing? N  Vision? N  Difficulty concentrating or making decisions? N  Walking or climbing stairs? N  Dressing or bathing? N  Doing errands, shopping? N  Preparing Food and eating ? N  Using the  Toilet? N  In the past six months, have you accidently leaked urine? N  Do you have problems with loss of bowel control? N  Managing your Medications? N  Managing your Finances? N  Housekeeping or managing your Housekeeping? N  Some recent data might be hidden    Fall/Depression Screening: Fall Risk  04/02/2019  Falls in the past year? 1  Number falls in past yr: 0  Injury with Fall? 1  Risk for fall due to : History of fall(s);Impaired vision;Impaired balance/gait;Impaired mobility;Medication side effect  Follow up Falls evaluation completed;Education  provided   PHQ 2/9 Scores 04/02/2019  PHQ - 2 Score 0    Assessment:  THN CM Care Plan Problem One     Most Recent Value  Care Plan Problem One  Patient at hish risk for readmission due to co-morbidities.  Role Documenting the Problem One  Care Management Telephonic [place] for Problem One  Active  [facility] Long Term Goal   Patient will have no hospital readmissions within the next 31 days.  THN Long Term Goal Start Date  04/02/19  Interventions for Problem One Long Term Goal  RN CM educated pt. on s/s of worsening condition and when to seek medical attention. RN CM reviewd d/c paperwork with pt. RN CM confirmed pt. has meds and understands them.   THN CM Short Term Goal #1   Patient will complete all post discharge MD appts within the next 30 days.  THN CM Short Term Goal #1 Start Date  04/02/19  Interventions for Short Term Goal #1  RN CM confirmed with patient that MD appts in place. RN CM reviewed appt schedule per D/C paperwork. RN CM confirmed with pt. that he has transportation to appts.  THN CM Short Term Goal #2   Patient will report improvement in right side weakness, mobility and dextrity over the next 30 days.  THN CM Short Term Goal #2 Start Date  04/02/19  Interventions for Short Term Goal #2  RN CM educated pt. on side effects of stroke and importance of strengthening exercises to regain mobility/strength.    [facility] CM Care Plan Problem Two     Most Recent Value  Care Plan Problem Two  Patient has not seen a dentist in over a year.  Role Documenting the Problem Two  Care Management Telephonic [place] for Problem Two  Active  Interventions for Problem Two Long Term Goal   RN CM assessed barriers to receiving dental care. RN CM placed referral to SW for dental resources.  THN Long Term Goal  Patient will schedule a dental appt for routine care over the next 31 days.  THN Long Term Goal Start Date  04/02/19     Consent: [facility] services reviewed and  discussed with patient. Verbal consent for services given.   Plan:  RN CM will send successful outreach letter to patient. RN CM will make outreach attempt to patient within one week. RN CM will send MD barriers letter and route encounter to PCP.  RN CM provided patient with RN CM/THN contact info for future reference. RN CM will send [facility] SW referral for dental resources.    Enzo Montgomery, RN,BSN,CCM [place] Management Telephonic Care Management Coordinator Direct Phone: [phone] Toll Free: [phone] Fax: [phone]

## 2019-04-03 ENCOUNTER — Other Ambulatory Visit: Payer: Self-pay

## 2019-04-03 NOTE — Patient Outreach (Signed)
[facility]) Care Management  04/03/2019  Stephen Flynn Oct 13, 1927 [id number]   Social work referral received from [employer], Enzo Montgomery, to outreach patient regarding dental resources as patient's insurance does not cover these services.  Successful outreach to patient today.  Provided him with contact information for the following: [facility], [facility]. [facility], [place], [facility]. Closing social work case but did encourage him to call if additional needs arise.  Ronn Melena, BSW Social Worker [phone]

## 2019-04-04 DIAGNOSIS — R471 Dysarthria and anarthria: Secondary | ICD-10-CM | POA: Diagnosis not present

## 2019-04-04 DIAGNOSIS — R4701 Aphasia: Secondary | ICD-10-CM | POA: Diagnosis not present

## 2019-04-04 DIAGNOSIS — E119 Type 2 diabetes mellitus without complications: Secondary | ICD-10-CM | POA: Diagnosis not present

## 2019-04-04 DIAGNOSIS — I509 Heart failure, unspecified: Secondary | ICD-10-CM | POA: Diagnosis not present

## 2019-04-04 DIAGNOSIS — I110 Hypertensive heart disease with heart failure: Secondary | ICD-10-CM | POA: Diagnosis not present

## 2019-04-04 DIAGNOSIS — G8191 Hemiplegia, unspecified affecting right dominant side: Secondary | ICD-10-CM | POA: Diagnosis not present

## 2019-04-04 DIAGNOSIS — N400 Enlarged prostate without lower urinary tract symptoms: Secondary | ICD-10-CM | POA: Diagnosis not present

## 2019-04-04 DIAGNOSIS — I2721 Secondary pulmonary arterial hypertension: Secondary | ICD-10-CM | POA: Diagnosis not present

## 2019-04-04 DIAGNOSIS — E785 Hyperlipidemia, unspecified: Secondary | ICD-10-CM | POA: Diagnosis not present

## 2019-04-07 DIAGNOSIS — G8191 Hemiplegia, unspecified affecting right dominant side: Secondary | ICD-10-CM | POA: Diagnosis not present

## 2019-04-07 DIAGNOSIS — R471 Dysarthria and anarthria: Secondary | ICD-10-CM | POA: Diagnosis not present

## 2019-04-07 DIAGNOSIS — I2721 Secondary pulmonary arterial hypertension: Secondary | ICD-10-CM | POA: Diagnosis not present

## 2019-04-07 DIAGNOSIS — I509 Heart failure, unspecified: Secondary | ICD-10-CM | POA: Diagnosis not present

## 2019-04-07 DIAGNOSIS — N400 Enlarged prostate without lower urinary tract symptoms: Secondary | ICD-10-CM | POA: Diagnosis not present

## 2019-04-07 DIAGNOSIS — R4701 Aphasia: Secondary | ICD-10-CM | POA: Diagnosis not present

## 2019-04-07 DIAGNOSIS — E119 Type 2 diabetes mellitus without complications: Secondary | ICD-10-CM | POA: Diagnosis not present

## 2019-04-07 DIAGNOSIS — I110 Hypertensive heart disease with heart failure: Secondary | ICD-10-CM | POA: Diagnosis not present

## 2019-04-07 DIAGNOSIS — E785 Hyperlipidemia, unspecified: Secondary | ICD-10-CM | POA: Diagnosis not present

## 2019-04-08 DIAGNOSIS — E119 Type 2 diabetes mellitus without complications: Secondary | ICD-10-CM | POA: Diagnosis not present

## 2019-04-08 DIAGNOSIS — G8191 Hemiplegia, unspecified affecting right dominant side: Secondary | ICD-10-CM | POA: Diagnosis not present

## 2019-04-08 DIAGNOSIS — N400 Enlarged prostate without lower urinary tract symptoms: Secondary | ICD-10-CM | POA: Diagnosis not present

## 2019-04-08 DIAGNOSIS — I509 Heart failure, unspecified: Secondary | ICD-10-CM | POA: Diagnosis not present

## 2019-04-08 DIAGNOSIS — E785 Hyperlipidemia, unspecified: Secondary | ICD-10-CM | POA: Diagnosis not present

## 2019-04-08 DIAGNOSIS — I110 Hypertensive heart disease with heart failure: Secondary | ICD-10-CM | POA: Diagnosis not present

## 2019-04-08 DIAGNOSIS — R471 Dysarthria and anarthria: Secondary | ICD-10-CM | POA: Diagnosis not present

## 2019-04-08 DIAGNOSIS — R4701 Aphasia: Secondary | ICD-10-CM | POA: Diagnosis not present

## 2019-04-08 DIAGNOSIS — I2721 Secondary pulmonary arterial hypertension: Secondary | ICD-10-CM | POA: Diagnosis not present

## 2019-04-09 ENCOUNTER — Other Ambulatory Visit: Payer: Self-pay

## 2019-04-09 NOTE — Patient Outreach (Signed)
[facility]) Care Management  04/09/2019  Romelle Glanton 21-Nov-1927 [id number]   Telephone Assessment    Outreach attempt #1 to patient. Spoke with patient who voices things are going fairly well. He reports that his biggest frustration is that his speech is still sightly impaired. He has been told that this should improve over time but he is still concerned/bothered with it. RN CM discussed with patient that this is normal given his recent stroke. Patient able to communicate and carry on meaningful conversation without any barriers at present. He voices that [place] was out to see him this week and reassured him that what he was experiencing was normal and should improve. He was encouraged to "keep talking more" to help improve speech. Patient denies any other RN CM needs or concerns at this time. He was appreciative of follow up call. He is aware that he can call [facility] if needed for any issues or concerns.    Plan: RN CM will make outreach attempt to patient within one month.   Enzo Montgomery, RN,BSN,CCM [place] Management Telephonic Care Management Coordinator Direct Phone: [phone] Toll Free: [phone] Fax: [phone]

## 2019-04-10 DIAGNOSIS — R471 Dysarthria and anarthria: Secondary | ICD-10-CM | POA: Diagnosis not present

## 2019-04-10 DIAGNOSIS — I509 Heart failure, unspecified: Secondary | ICD-10-CM | POA: Diagnosis not present

## 2019-04-10 DIAGNOSIS — N400 Enlarged prostate without lower urinary tract symptoms: Secondary | ICD-10-CM | POA: Diagnosis not present

## 2019-04-10 DIAGNOSIS — G8191 Hemiplegia, unspecified affecting right dominant side: Secondary | ICD-10-CM | POA: Diagnosis not present

## 2019-04-10 DIAGNOSIS — E785 Hyperlipidemia, unspecified: Secondary | ICD-10-CM | POA: Diagnosis not present

## 2019-04-10 DIAGNOSIS — E119 Type 2 diabetes mellitus without complications: Secondary | ICD-10-CM | POA: Diagnosis not present

## 2019-04-10 DIAGNOSIS — I110 Hypertensive heart disease with heart failure: Secondary | ICD-10-CM | POA: Diagnosis not present

## 2019-04-10 DIAGNOSIS — R4701 Aphasia: Secondary | ICD-10-CM | POA: Diagnosis not present

## 2019-04-10 DIAGNOSIS — I2721 Secondary pulmonary arterial hypertension: Secondary | ICD-10-CM | POA: Diagnosis not present

## 2019-04-12 DIAGNOSIS — G8191 Hemiplegia, unspecified affecting right dominant side: Secondary | ICD-10-CM | POA: Diagnosis not present

## 2019-04-12 DIAGNOSIS — R4701 Aphasia: Secondary | ICD-10-CM | POA: Diagnosis not present

## 2019-04-12 DIAGNOSIS — N400 Enlarged prostate without lower urinary tract symptoms: Secondary | ICD-10-CM | POA: Diagnosis not present

## 2019-04-12 DIAGNOSIS — E119 Type 2 diabetes mellitus without complications: Secondary | ICD-10-CM | POA: Diagnosis not present

## 2019-04-12 DIAGNOSIS — I509 Heart failure, unspecified: Secondary | ICD-10-CM | POA: Diagnosis not present

## 2019-04-12 DIAGNOSIS — R471 Dysarthria and anarthria: Secondary | ICD-10-CM | POA: Diagnosis not present

## 2019-04-12 DIAGNOSIS — I2721 Secondary pulmonary arterial hypertension: Secondary | ICD-10-CM | POA: Diagnosis not present

## 2019-04-12 DIAGNOSIS — E785 Hyperlipidemia, unspecified: Secondary | ICD-10-CM | POA: Diagnosis not present

## 2019-04-12 DIAGNOSIS — I110 Hypertensive heart disease with heart failure: Secondary | ICD-10-CM | POA: Diagnosis not present

## 2019-04-14 DIAGNOSIS — I2721 Secondary pulmonary arterial hypertension: Secondary | ICD-10-CM | POA: Diagnosis not present

## 2019-04-14 DIAGNOSIS — N400 Enlarged prostate without lower urinary tract symptoms: Secondary | ICD-10-CM | POA: Diagnosis not present

## 2019-04-14 DIAGNOSIS — E119 Type 2 diabetes mellitus without complications: Secondary | ICD-10-CM | POA: Diagnosis not present

## 2019-04-14 DIAGNOSIS — E785 Hyperlipidemia, unspecified: Secondary | ICD-10-CM | POA: Diagnosis not present

## 2019-04-14 DIAGNOSIS — I110 Hypertensive heart disease with heart failure: Secondary | ICD-10-CM | POA: Diagnosis not present

## 2019-04-14 DIAGNOSIS — G8191 Hemiplegia, unspecified affecting right dominant side: Secondary | ICD-10-CM | POA: Diagnosis not present

## 2019-04-14 DIAGNOSIS — R471 Dysarthria and anarthria: Secondary | ICD-10-CM | POA: Diagnosis not present

## 2019-04-14 DIAGNOSIS — R4701 Aphasia: Secondary | ICD-10-CM | POA: Diagnosis not present

## 2019-04-14 DIAGNOSIS — I509 Heart failure, unspecified: Secondary | ICD-10-CM | POA: Diagnosis not present

## 2019-04-16 DIAGNOSIS — G8191 Hemiplegia, unspecified affecting right dominant side: Secondary | ICD-10-CM | POA: Diagnosis not present

## 2019-04-16 DIAGNOSIS — R471 Dysarthria and anarthria: Secondary | ICD-10-CM | POA: Diagnosis not present

## 2019-04-16 DIAGNOSIS — E785 Hyperlipidemia, unspecified: Secondary | ICD-10-CM | POA: Diagnosis not present

## 2019-04-16 DIAGNOSIS — R4701 Aphasia: Secondary | ICD-10-CM | POA: Diagnosis not present

## 2019-04-16 DIAGNOSIS — E119 Type 2 diabetes mellitus without complications: Secondary | ICD-10-CM | POA: Diagnosis not present

## 2019-04-16 DIAGNOSIS — I509 Heart failure, unspecified: Secondary | ICD-10-CM | POA: Diagnosis not present

## 2019-04-16 DIAGNOSIS — I110 Hypertensive heart disease with heart failure: Secondary | ICD-10-CM | POA: Diagnosis not present

## 2019-04-16 DIAGNOSIS — I2721 Secondary pulmonary arterial hypertension: Secondary | ICD-10-CM | POA: Diagnosis not present

## 2019-04-16 DIAGNOSIS — N400 Enlarged prostate without lower urinary tract symptoms: Secondary | ICD-10-CM | POA: Diagnosis not present

## 2019-04-18 DIAGNOSIS — E119 Type 2 diabetes mellitus without complications: Secondary | ICD-10-CM | POA: Diagnosis not present

## 2019-04-18 DIAGNOSIS — E785 Hyperlipidemia, unspecified: Secondary | ICD-10-CM | POA: Diagnosis not present

## 2019-04-18 DIAGNOSIS — I509 Heart failure, unspecified: Secondary | ICD-10-CM | POA: Diagnosis not present

## 2019-04-18 DIAGNOSIS — R471 Dysarthria and anarthria: Secondary | ICD-10-CM | POA: Diagnosis not present

## 2019-04-18 DIAGNOSIS — I2721 Secondary pulmonary arterial hypertension: Secondary | ICD-10-CM | POA: Diagnosis not present

## 2019-04-18 DIAGNOSIS — G8191 Hemiplegia, unspecified affecting right dominant side: Secondary | ICD-10-CM | POA: Diagnosis not present

## 2019-04-18 DIAGNOSIS — R4701 Aphasia: Secondary | ICD-10-CM | POA: Diagnosis not present

## 2019-04-18 DIAGNOSIS — N400 Enlarged prostate without lower urinary tract symptoms: Secondary | ICD-10-CM | POA: Diagnosis not present

## 2019-04-18 DIAGNOSIS — I110 Hypertensive heart disease with heart failure: Secondary | ICD-10-CM | POA: Diagnosis not present

## 2019-04-21 DIAGNOSIS — I2721 Secondary pulmonary arterial hypertension: Secondary | ICD-10-CM | POA: Diagnosis not present

## 2019-04-21 DIAGNOSIS — N400 Enlarged prostate without lower urinary tract symptoms: Secondary | ICD-10-CM | POA: Diagnosis not present

## 2019-04-21 DIAGNOSIS — R471 Dysarthria and anarthria: Secondary | ICD-10-CM | POA: Diagnosis not present

## 2019-04-21 DIAGNOSIS — R4701 Aphasia: Secondary | ICD-10-CM | POA: Diagnosis not present

## 2019-04-21 DIAGNOSIS — G8191 Hemiplegia, unspecified affecting right dominant side: Secondary | ICD-10-CM | POA: Diagnosis not present

## 2019-04-21 DIAGNOSIS — E785 Hyperlipidemia, unspecified: Secondary | ICD-10-CM | POA: Diagnosis not present

## 2019-04-21 DIAGNOSIS — I110 Hypertensive heart disease with heart failure: Secondary | ICD-10-CM | POA: Diagnosis not present

## 2019-04-21 DIAGNOSIS — I509 Heart failure, unspecified: Secondary | ICD-10-CM | POA: Diagnosis not present

## 2019-04-21 DIAGNOSIS — E119 Type 2 diabetes mellitus without complications: Secondary | ICD-10-CM | POA: Diagnosis not present

## 2019-04-22 DIAGNOSIS — I110 Hypertensive heart disease with heart failure: Secondary | ICD-10-CM | POA: Diagnosis not present

## 2019-04-22 DIAGNOSIS — N400 Enlarged prostate without lower urinary tract symptoms: Secondary | ICD-10-CM | POA: Diagnosis not present

## 2019-04-22 DIAGNOSIS — R471 Dysarthria and anarthria: Secondary | ICD-10-CM | POA: Diagnosis not present

## 2019-04-22 DIAGNOSIS — G8191 Hemiplegia, unspecified affecting right dominant side: Secondary | ICD-10-CM | POA: Diagnosis not present

## 2019-04-22 DIAGNOSIS — I509 Heart failure, unspecified: Secondary | ICD-10-CM | POA: Diagnosis not present

## 2019-04-22 DIAGNOSIS — E785 Hyperlipidemia, unspecified: Secondary | ICD-10-CM | POA: Diagnosis not present

## 2019-04-22 DIAGNOSIS — R4701 Aphasia: Secondary | ICD-10-CM | POA: Diagnosis not present

## 2019-04-22 DIAGNOSIS — I2721 Secondary pulmonary arterial hypertension: Secondary | ICD-10-CM | POA: Diagnosis not present

## 2019-04-22 DIAGNOSIS — E119 Type 2 diabetes mellitus without complications: Secondary | ICD-10-CM | POA: Diagnosis not present

## 2019-04-27 ENCOUNTER — Other Ambulatory Visit: Payer: Self-pay

## 2019-04-27 ENCOUNTER — Emergency Department (HOSPITAL_COMMUNITY): Payer: Medicare PPO

## 2019-04-27 ENCOUNTER — Encounter (HOSPITAL_COMMUNITY): Payer: Self-pay

## 2019-04-27 ENCOUNTER — Emergency Department (HOSPITAL_COMMUNITY)
Admission: EM | Admit: 2019-04-27 | Discharge: 2019-04-27 | Disposition: A | Discharge status: Home / Self Care | Payer: Medicare PPO | Attending: Emergency Medicine | Admitting: Emergency Medicine

## 2019-04-27 DIAGNOSIS — E119 Type 2 diabetes mellitus without complications: Secondary | ICD-10-CM | POA: Insufficient documentation

## 2019-04-27 DIAGNOSIS — Z7982 Long term (current) use of aspirin: Secondary | ICD-10-CM | POA: Insufficient documentation

## 2019-04-27 DIAGNOSIS — R296 Repeated falls: Secondary | ICD-10-CM | POA: Insufficient documentation

## 2019-04-27 DIAGNOSIS — Z7901 Long term (current) use of anticoagulants: Secondary | ICD-10-CM | POA: Diagnosis not present

## 2019-04-27 DIAGNOSIS — R531 Weakness: Secondary | ICD-10-CM | POA: Diagnosis not present

## 2019-04-27 DIAGNOSIS — I69351 Hemiplegia and hemiparesis following cerebral infarction affecting right dominant side: Secondary | ICD-10-CM | POA: Insufficient documentation

## 2019-04-27 DIAGNOSIS — I639 Cerebral infarction, unspecified: Secondary | ICD-10-CM | POA: Diagnosis not present

## 2019-04-27 DIAGNOSIS — Z87891 Personal history of nicotine dependence: Secondary | ICD-10-CM | POA: Diagnosis not present

## 2019-04-27 DIAGNOSIS — I10 Essential (primary) hypertension: Secondary | ICD-10-CM | POA: Insufficient documentation

## 2019-04-27 DIAGNOSIS — W19XXXA Unspecified fall, initial encounter: Secondary | ICD-10-CM

## 2019-04-27 LAB — CBC WITH DIFFERENTIAL/PLATELET
Abs Immature Granulocytes: 0.01 10*3/uL (ref 0.00–0.07)
Basophils Absolute: 0 10*3/uL (ref 0.0–0.1)
Basophils Relative: 1 %
Eosinophils Absolute: 0.2 10*3/uL (ref 0.0–0.5)
Eosinophils Relative: 5 %
HCT: 34.2 % — ABNORMAL LOW (ref 39.0–52.0)
Hemoglobin: 11 g/dL — ABNORMAL LOW (ref 13.0–17.0)
Immature Granulocytes: 0 %
Lymphocytes Relative: 46 %
Lymphs Abs: 1.8 10*3/uL (ref 0.7–4.0)
MCH: 30.5 pg (ref 26.0–34.0)
MCHC: 32.2 g/dL (ref 30.0–36.0)
MCV: 94.7 fL (ref 80.0–100.0)
Monocytes Absolute: 0.3 10*3/uL (ref 0.1–1.0)
Monocytes Relative: 7 %
Neutro Abs: 1.6 10*3/uL — ABNORMAL LOW (ref 1.7–7.7)
Neutrophils Relative %: 41 %
Platelets: 168 10*3/uL (ref 150–400)
RBC: 3.61 MIL/uL — ABNORMAL LOW (ref 4.22–5.81)
RDW: 13.5 % (ref 11.5–15.5)
WBC: 3.9 10*3/uL — ABNORMAL LOW (ref 4.0–10.5)
nRBC: 0 % (ref 0.0–0.2)

## 2019-04-27 LAB — URINALYSIS, ROUTINE W REFLEX MICROSCOPIC
Bilirubin Urine: NEGATIVE
Glucose, UA: NEGATIVE mg/dL
Hgb urine dipstick: NEGATIVE
Ketones, ur: NEGATIVE mg/dL
Leukocytes,Ua: NEGATIVE
Nitrite: NEGATIVE
Protein, ur: NEGATIVE mg/dL
Specific Gravity, Urine: 1.014 (ref 1.005–1.030)
pH: 6 (ref 5.0–8.0)

## 2019-04-27 LAB — BASIC METABOLIC PANEL
Anion gap: 8 (ref 5–15)
BUN: 11 mg/dL (ref 8–23)
CO2: 27 mmol/L (ref 22–32)
Calcium: 9.6 mg/dL (ref 8.9–10.3)
Chloride: 103 mmol/L (ref 98–111)
Creatinine, Ser: 0.94 mg/dL (ref 0.61–1.24)
GFR calc Af Amer: >60 mL/min (ref >60)
GFR calc non Af Amer: >60 mL/min (ref >60)
Glucose, Bld: 101 mg/dL — ABNORMAL HIGH (ref 70–99)
Potassium: 4.2 mmol/L (ref 3.5–5.1)
Sodium: 138 mmol/L (ref 135–145)

## 2019-04-27 LAB — PROTIME-INR
INR: 1.1 (ref 0.8–1.2)
Prothrombin Time: 13.9 seconds (ref 11.4–15.2)

## 2019-04-27 NOTE — ED Triage Notes (Signed)
Patient states he had a stroke about a month ago and found out when he experienced a fall and came to the ER. The patient states he had a fall last night and wants to be checked out.

## 2019-04-27 NOTE — ED Notes (Signed)
Patient transported to MRI 

## 2019-04-27 NOTE — ED Notes (Signed)
Patient verbalizes understanding of discharge instructions. Opportunity for questioning and answers were provided. Armband removed by staff, pt discharged from ED.  

## 2019-04-27 NOTE — ED Provider Notes (Signed)
[facility] EMERGENCY DEPARTMENT Provider Note   CSN: [id number] Arrival date & time: 04/27/19  1205     History   Chief Complaint Chief Complaint  Patient presents with   Fall    HPI Stephen Flynn is a 85 y.o. male.     Patient with history of stroke with right-sided weakness who presents to the ED after multiple falls over the last 2 days.  Concern for another stroke.  States that he has been having speech difficulty and right-sided weakness since his stroke.  He feels that it got worse about 3 days ago and has had increased falls.  However, patient has not been using his cane.  He has been able to ambulate since his last fall.  Does not have any pain.  Does not appear to have any new neurological problems but he feels that his chronic weakness is worse.  The history is provided by the patient.  Neurologic Problem This is a new problem. The current episode started 2 days ago. The problem has not changed since onset.Pertinent negatives include no chest pain, no abdominal pain, no headaches and no shortness of breath. Nothing aggravates the symptoms. Nothing relieves the symptoms. He has tried nothing for the symptoms. The treatment provided no relief.    Past Medical History:  Diagnosis Date   Anemia    Aortic insufficiency    Chest pain    H/O: GI bleed    Hyperlipidemia    Hypertension    Leg cramps     Patient Active Problem List   Diagnosis Date Noted   Ischemic stroke ([place]) 03/21/2019   Essential hypertension 03/21/2019   BPH (benign prostatic hyperplasia) 03/21/2019   UNSPECIFIED ANEMIA 07/25/2007   G E R D 07/25/2007   HIATAL HERNIA 07/25/2007   HEMORRHOIDS, INTERNAL 05/28/2007   UNS GASTRITIS&GASTRODUODITIS W/O MENTION HEMORR 05/28/2007   DUODENITIS WITHOUT MENTION OF HEMORRHAGE 05/28/2007    Past Surgical History:  Procedure Laterality Date   CARDIOVASCULAR STRESS TEST  09/04/2005   US ECHOCARDIOGRAPHY  11/26/2007   ef 55-60%        Home Medications    Prior to Admission medications   Medication Sig Start Date End Date Taking? Authorizing Provider  amLODipine (NORVASC) 5 MG tablet Take 5 mg by mouth daily.    [provider]  aspirin EC 325 MG EC tablet Take 1 tablet (325 mg total) by mouth daily. 03/24/19 06/22/19  Barb Merino, MD  atorvastatin (LIPITOR) 80 MG tablet Take 1 tablet (80 mg total) by mouth daily at 6 PM. 03/23/19 06/21/19  Barb Merino, MD  clopidogrel (PLAVIX) 75 MG tablet Take 1 tablet (75 mg total) by mouth daily. 03/24/19 06/22/19  Barb Merino, MD  metFORMIN (GLUCOPHAGE) 500 MG tablet Take 1 tablet (500 mg total) by mouth 2 (two) times daily with a meal. 03/23/19 03/22/20  Barb Merino, MD  Polyethyl Glycol-Propyl Glycol (SYSTANE FREE OP) Place 1 drop into both eyes daily as needed.    [provider]  tamsulosin (FLOMAX) 0.4 MG CAPS capsule Take 0.4 mg by mouth.    [provider]    Family History No family history on file.  Social History Social History   Tobacco Use   Smoking status: Former Smoker   Smokeless tobacco: Never Used  Substance Use Topics   Alcohol use: No   Drug use: No     Allergies   Penicillins   Review of Systems Review of Systems  Constitutional: Negative for  chills and fever.  HENT: Negative for ear pain and sore throat.   Eyes: Negative for pain and visual disturbance.  Respiratory: Negative for cough and shortness of breath.   Cardiovascular: Negative for chest pain and palpitations.  Gastrointestinal: Negative for abdominal pain and vomiting.  Genitourinary: Negative for dysuria and hematuria.  Musculoskeletal: Negative for arthralgias and back pain.  Skin: Negative for color change and rash.  Neurological: Positive for speech difficulty and weakness. Negative for seizures, syncope and headaches.  All other systems reviewed and are negative.    Physical Exam Updated Vital Signs  ED Triage  Vitals  Enc Vitals Group     BP 04/27/19 1225 (!) 146/92     Pulse Rate 04/27/19 1225 65     Resp 04/27/19 1225 16     Temp 04/27/19 1225 98.4 F (36.9 C)     Temp Source 04/27/19 1225 Oral     SpO2 04/27/19 1225 96 %     Weight 04/27/19 1221 235 lb (106.6 kg)     Height 04/27/19 1221 5\' 9"  (1.753 m)     Head Circumference --      Peak Flow --      Pain Score 04/27/19 1221 0     Pain Loc --      Pain Edu? --      Excl. in [place]? --     Physical Exam Vitals signs and nursing note reviewed.  Constitutional:      Appearance: He is well-developed.  HENT:     Head: Normocephalic and atraumatic.     Nose: Nose normal.     Mouth/Throat:     Mouth: Mucous membranes are moist.  Eyes:     Conjunctiva/sclera: Conjunctivae normal.     Pupils: Pupils are equal, round, and reactive to light.  Neck:     Musculoskeletal: Normal range of motion and neck supple.  Cardiovascular:     Rate and Rhythm: Normal rate and regular rhythm.     Pulses: Normal pulses.     Heart sounds: Normal heart sounds. No murmur.  Pulmonary:     Effort: Pulmonary effort is normal. No respiratory distress.     Breath sounds: Normal breath sounds.  Abdominal:     Palpations: Abdomen is soft.     Tenderness: There is no abdominal tenderness.  Skin:    General: Skin is warm and dry.     Capillary Refill: Capillary refill takes less than 2 seconds.  Neurological:     Mental Status: He is alert.     Sensory: No sensory deficit.     Coordination: Coordination normal.     Comments: 4+ out of 5 strength throughout right upper and right lower extremity, 5+ out of 5 strength through left upper and left lower extremity, normal sensation, some difficulty with getting out speech, no drift, normal finger-to-nose finger, no visual field deficit, no neglect      ED Treatments / Results  Labs (all labs ordered are listed, but only abnormal results are displayed) Labs Reviewed  CBC WITH DIFFERENTIAL/PLATELET - Abnormal;  Notable for the following components:      Result Value   WBC 3.9 (*)    RBC 3.61 (*)    Hemoglobin 11.0 (*)    HCT 34.2 (*)    Neutro Abs 1.6 (*)    All other components within normal limits  BASIC METABOLIC PANEL - Abnormal; Notable for the following components:   Glucose, Bld 101 (*)  All other components within normal limits  PROTIME-INR  URINALYSIS, ROUTINE W REFLEX MICROSCOPIC    EKG EKG Interpretation  Date/Time:  Sunday April 27 2019 16:05:00 EDT Ventricular Rate:  59 PR Interval:    QRS Duration: 98 QT Interval:  432 QTC Calculation: 428 R Axis:   -20 Text Interpretation:  Sinus rhythm Prolonged PR interval Borderline left axis deviation Confirmed by Lennice Sites [phone]) on 04/27/2019 4:46:10 PM   Radiology Mr Brain Wo Contrast (neuro Protocol)  Result Date: 04/27/2019 CLINICAL DATA:  Focal neuro deficit, greater than 6 hours, stroke suspected. EXAM: MRI HEAD WITHOUT CONTRAST TECHNIQUE: Multiplanar, multiecho pulse sequences of the brain and surrounding structures were obtained without intravenous contrast. COMPARISON:  CT angiogram head/neck 03/21/2019, brain MRI 03/21/2019 FINDINGS: Brain: Motion degraded examination. There are two punctate acute/early subacute white matter infarcts within the left corona radiata (series 5, image 82) (series 5, image 81). No evidence of intracranial mass. No midline shift or extra-axial fluid collection. Redemonstrated background of moderate chronic small vessel ischemic disease with multiple chronic lacunar infarcts within the bilateral cerebral white matter. Redemonstrated chronic microhemorrhage within the left temporal lobe. Mild generalized parenchymal atrophy. 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 Skull and upper cervical spine: No focal marrow lesion Sinuses/Orbits: Visualized orbits demonstrate no acute abnormality. Mild to moderate scattered  paranasal sinus mucosal thickening with small left maxillary sinus air-fluid level. Left mastoid effusion. Trace fluid also present within right mastoid air cells. Other: Redemonstrated right suboccipital lipoma, partially imaged. These results were called by telephone at the time of interpretation on 04/27/2019 at 5:10 pm to provider Pluma Diniz , who verbally acknowledged these results. IMPRESSION: 1. Motion degraded examination. 2. Two punctate acute/early subacute white matter infarcts within the left corona radiata. 3. 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 4. Generalized parenchymal atrophy. Redemonstrated moderate chronic small vessel ischemic disease with multiple chronic lacunar infarcts within the bilateral cerebral white matter. 5. Paranasal sinus disease with mastoid effusions, as described. Electronically Signed   By: Kellie Simmering DO   On: 04/27/2019 17:11    Procedures Procedures (including critical care time)  Medications Ordered in ED Medications - No data to display   Initial Impression / Assessment and Plan / ED Course  I have reviewed the triage vital signs and the nursing notes.  Pertinent labs & imaging results that were available during my care of the patient were reviewed by me and considered in my medical decision making (see chart for details).     Celedonio Loup is an 85 year old male with history of stroke who presents to the ED with fall, weakness.  Patient with unremarkable vitals.  No fever.  Patient suffered recent stroke with residual right-sided weakness.  Has been working with physical therapy.  Has had some falls over the last 2 days and was concerned about having another stroke.  He appears to be at his neurologic baseline but I have never evaluated patient before.  Has weakness in the right upper and lower extremity.  Has some speech difficulties.  He states that  this is mostly chronic but may be worse over the last several days.  Lab work showed no significant anemia, electrolyte abnormality, kidney injury.  No urine infection.  MRI was discussed over the phone with radiology and they thought possibly 2 more smaller subacute strokes.  However, they thought  likely an extension from his recent stroke from a month ago.  I discussed with Dr. Cheral Marker with neurology on the phone who states that this is likely all from his first stroke.  No need to change any medical management at this time. Does not appear to have acute stroke on MRI. Patient already on aspirin and Plavix.  Does not appear to have any new major deficits on exam.  Has not been ambulating with his cane and educated him about using these assistive devices as he continues to rehab from his stroke.  Overall does not appear that MRI shows any new process at this time.  No need for admission or any other further work-up.  Recommend follow-up with neurology and continue work with physical therapy.  Discharged from the ED in good condition.  Given return precautions.  This chart was dictated using voice recognition software.  Despite best efforts to proofread,  errors can occur which can change the documentation meaning.    Final Clinical Impressions(s) / ED Diagnoses   Final diagnoses:  Fall, initial encounter    ED Discharge Orders    None       Lennice Sites, DO 04/27/19 1749

## 2019-04-27 NOTE — Discharge Instructions (Addendum)
There was no sign of new stroke on her MRI today.  Continue physical therapy.  Continue ambulation with your cane.  Please return to the ED if your symptoms worsen.  Follow-up with neurology.

## 2019-04-27 NOTE — ED Notes (Signed)
Call Mr. Denise Deringer's daughter,  Mariana Arn at [phone]

## 2019-04-27 NOTE — ED Notes (Signed)
Pt's wife is waiting and would like to come back when pt is roomed.   Doris [phone]

## 2019-04-28 DIAGNOSIS — I2721 Secondary pulmonary arterial hypertension: Secondary | ICD-10-CM | POA: Diagnosis not present

## 2019-04-28 DIAGNOSIS — I110 Hypertensive heart disease with heart failure: Secondary | ICD-10-CM | POA: Diagnosis not present

## 2019-04-28 DIAGNOSIS — R471 Dysarthria and anarthria: Secondary | ICD-10-CM | POA: Diagnosis not present

## 2019-04-28 DIAGNOSIS — I509 Heart failure, unspecified: Secondary | ICD-10-CM | POA: Diagnosis not present

## 2019-04-28 DIAGNOSIS — G8191 Hemiplegia, unspecified affecting right dominant side: Secondary | ICD-10-CM | POA: Diagnosis not present

## 2019-04-28 DIAGNOSIS — E119 Type 2 diabetes mellitus without complications: Secondary | ICD-10-CM | POA: Diagnosis not present

## 2019-04-28 DIAGNOSIS — R4701 Aphasia: Secondary | ICD-10-CM | POA: Diagnosis not present

## 2019-04-28 DIAGNOSIS — E785 Hyperlipidemia, unspecified: Secondary | ICD-10-CM | POA: Diagnosis not present

## 2019-04-28 DIAGNOSIS — N400 Enlarged prostate without lower urinary tract symptoms: Secondary | ICD-10-CM | POA: Diagnosis not present

## 2019-05-02 DIAGNOSIS — I2721 Secondary pulmonary arterial hypertension: Secondary | ICD-10-CM | POA: Diagnosis not present

## 2019-05-02 DIAGNOSIS — R471 Dysarthria and anarthria: Secondary | ICD-10-CM | POA: Diagnosis not present

## 2019-05-02 DIAGNOSIS — G8191 Hemiplegia, unspecified affecting right dominant side: Secondary | ICD-10-CM | POA: Diagnosis not present

## 2019-05-02 DIAGNOSIS — E119 Type 2 diabetes mellitus without complications: Secondary | ICD-10-CM | POA: Diagnosis not present

## 2019-05-02 DIAGNOSIS — R4701 Aphasia: Secondary | ICD-10-CM | POA: Diagnosis not present

## 2019-05-02 DIAGNOSIS — N400 Enlarged prostate without lower urinary tract symptoms: Secondary | ICD-10-CM | POA: Diagnosis not present

## 2019-05-02 DIAGNOSIS — I509 Heart failure, unspecified: Secondary | ICD-10-CM | POA: Diagnosis not present

## 2019-05-02 DIAGNOSIS — E785 Hyperlipidemia, unspecified: Secondary | ICD-10-CM | POA: Diagnosis not present

## 2019-05-02 DIAGNOSIS — I110 Hypertensive heart disease with heart failure: Secondary | ICD-10-CM | POA: Diagnosis not present

## 2019-05-06 ENCOUNTER — Other Ambulatory Visit: Payer: Self-pay

## 2019-05-06 DIAGNOSIS — C61 Malignant neoplasm of prostate: Secondary | ICD-10-CM | POA: Diagnosis not present

## 2019-05-06 NOTE — Patient Outreach (Signed)
[facility]) Care Management  05/06/2019  Stephen Flynn 04/18/1928 [id number]   Telephone Assessment    Outreach attempt #1 to patient. No answer. RN CM left HIPAA compliant voicemail message along with contact info.      Plan: RN CM will make outreach attempt to patient within 3-4 business days.   Enzo [place], RN,BSN,CCM [place] Management Telephonic Care Management Coordinator Direct Phone: [phone] Toll Free: [phone] Fax: [phone]

## 2019-05-07 DIAGNOSIS — I110 Hypertensive heart disease with heart failure: Secondary | ICD-10-CM | POA: Diagnosis not present

## 2019-05-07 DIAGNOSIS — G8191 Hemiplegia, unspecified affecting right dominant side: Secondary | ICD-10-CM | POA: Diagnosis not present

## 2019-05-07 DIAGNOSIS — R4701 Aphasia: Secondary | ICD-10-CM | POA: Diagnosis not present

## 2019-05-07 DIAGNOSIS — I2721 Secondary pulmonary arterial hypertension: Secondary | ICD-10-CM | POA: Diagnosis not present

## 2019-05-07 DIAGNOSIS — N400 Enlarged prostate without lower urinary tract symptoms: Secondary | ICD-10-CM | POA: Diagnosis not present

## 2019-05-07 DIAGNOSIS — E785 Hyperlipidemia, unspecified: Secondary | ICD-10-CM | POA: Diagnosis not present

## 2019-05-07 DIAGNOSIS — R471 Dysarthria and anarthria: Secondary | ICD-10-CM | POA: Diagnosis not present

## 2019-05-07 DIAGNOSIS — E119 Type 2 diabetes mellitus without complications: Secondary | ICD-10-CM | POA: Diagnosis not present

## 2019-05-07 DIAGNOSIS — I509 Heart failure, unspecified: Secondary | ICD-10-CM | POA: Diagnosis not present

## 2019-05-08 ENCOUNTER — Other Ambulatory Visit: Payer: Self-pay

## 2019-05-08 NOTE — Patient Outreach (Addendum)
[facility]) Care Management  05/08/2019  Stephen Flynn [birth year] [id number]   Telephone Assessment    Outreach attempt #2 to patient. Spoke with patient. He states that he is doing well. Patient shares that he finished up with HHST on yesterday. He feels that his speech has improved and he is making progress. Patient was in the ED on 04/27/2019 secondary to a fall. Patient reports that his right leg kind of gave out on him. He admits that he was not using the cane at the time. Patient reports he is using cane "about 92% of the time." RN CM discussed fall/safety measures with patient and importance of using DME all the time to avoid falls/injuries. He voiced understanding and stated he would try to do so. He goes to see urologist on next week. Patient has stroke follow up appt with neurologist on 06/02/2019. No issues with transportation reported. He has supportive spouse and children that continue to assist him as needed. He denies any recent changes in his meds. No RN CM needs or concerns voiced at this time.   [facility] CM Care Plan Problem One     Most Recent Value  Care Plan Problem One  Patient at hish risk for readmission due to co-morbidities.  Role Documenting the Problem One  Care Management Telephonic [place] for Problem One  Active  [facility] Long Term Goal   Patient will have no hospital readmissions within the next 31 days.  [facility] Long Term Goal Start Date  04/02/19  Inter[facility] for Problem One Long Term Goal  RN CM assessed for any acute issues or sxs. RN CM reviewed action plan with pt. on what to do if sxs worsens.  [facility] CM Short Term Goal #1   Patient will complete all post discharge MD appts within the next 30 days.  [facility] CM Short Term Goal #1 Start Date  04/02/19  Interventions for Short Term Goal #1  RNCM reviewed appt schedule with pt-pt still has pending neuro appt on 06/02/2019. RN CM confirmed reliable transportation available to get him to appts.   [facility] CM Short Term Goal #2   Patient will report improvement in right side weakness, mobility and dextrity over the next 30 days.  [facility] CM Short Term Goal #2 Start Date  04/02/19    [facility] CM Care Plan Problem Two     Most Recent Value  Care Plan Problem Two  Patient has not seen a dentist in over a year.  Role Documenting the Problem Two  Care Management Telephonic [place] for Problem Two  Active  Interventions for Problem Two Long Term Goal   RN CM did not discuss during this call.  [facility] Long Term Goal  Patient will schedule a dental appt for routine care over the next 31 days.  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  [facility] Long Term Goal Start Date  04/09/19  Interventions for Problem Three Long Term Goal  RNCM assessed for speech improvement. RN CM confirmed with pt that he completed HHST treatments.  [facility] CM Short  Term Goal #1   Patient will verbalize actively participating with [place] for speech improvement over the next 30 days.  [facility] CM Short Term Goal #1 Start Date  04/09/19  [facility] CM Short Term Goal #1 Met Date  05/08/19  Interventions for Short Term Goal #1  RN CM confirmed that therapy services were completed.         Plan: RN CM discussed with patient next outreach within a month. Patient gave verbal consent and in agreement with RN CM follow up timeframe. Patient aware that they may contact RN CM sooner for any issues or concerns.  Enzo Montgomery, RN,BSN,CCM [place] Management Telephonic Care Management Coordinator Direct Phone: [phone] Toll Free: [phone] Fax: [phone]

## 2019-05-09 ENCOUNTER — Ambulatory Visit: Payer: Self-pay

## 2019-05-13 DIAGNOSIS — R4701 Aphasia: Secondary | ICD-10-CM | POA: Diagnosis not present

## 2019-05-13 DIAGNOSIS — R2689 Other abnormalities of gait and mobility: Secondary | ICD-10-CM | POA: Diagnosis not present

## 2019-05-13 DIAGNOSIS — I10 Essential (primary) hypertension: Secondary | ICD-10-CM | POA: Diagnosis not present

## 2019-05-13 DIAGNOSIS — E785 Hyperlipidemia, unspecified: Secondary | ICD-10-CM | POA: Diagnosis not present

## 2019-05-13 DIAGNOSIS — R102 Pelvic and perineal pain: Secondary | ICD-10-CM | POA: Diagnosis not present

## 2019-05-13 DIAGNOSIS — C61 Malignant neoplasm of prostate: Secondary | ICD-10-CM | POA: Diagnosis not present

## 2019-05-13 DIAGNOSIS — D509 Iron deficiency anemia, unspecified: Secondary | ICD-10-CM | POA: Diagnosis not present

## 2019-05-13 DIAGNOSIS — I712 Thoracic aortic aneurysm, without rupture: Secondary | ICD-10-CM | POA: Diagnosis not present

## 2019-05-13 DIAGNOSIS — E119 Type 2 diabetes mellitus without complications: Secondary | ICD-10-CM | POA: Diagnosis not present

## 2019-05-13 DIAGNOSIS — I6990 Unspecified sequelae of unspecified cerebrovascular disease: Secondary | ICD-10-CM | POA: Diagnosis not present

## 2019-05-14 ENCOUNTER — Other Ambulatory Visit (HOSPITAL_COMMUNITY): Payer: Self-pay | Admitting: Urology

## 2019-05-14 ENCOUNTER — Other Ambulatory Visit: Payer: Self-pay | Admitting: Urology

## 2019-05-14 DIAGNOSIS — C61 Malignant neoplasm of prostate: Secondary | ICD-10-CM

## 2019-05-16 ENCOUNTER — Encounter (HOSPITAL_COMMUNITY): Payer: Self-pay | Admitting: Emergency Medicine

## 2019-05-16 ENCOUNTER — Emergency Department (HOSPITAL_COMMUNITY): Payer: Medicare PPO

## 2019-05-16 ENCOUNTER — Emergency Department (HOSPITAL_COMMUNITY)
Admission: EM | Admit: 2019-05-16 | Discharge: 2019-05-16 | Disposition: A | Discharge status: Home / Self Care | Payer: Medicare PPO | Attending: Emergency Medicine | Admitting: Emergency Medicine

## 2019-05-16 DIAGNOSIS — Y999 Unspecified external cause status: Secondary | ICD-10-CM | POA: Diagnosis not present

## 2019-05-16 DIAGNOSIS — Y9389 Activity, other specified: Secondary | ICD-10-CM | POA: Insufficient documentation

## 2019-05-16 DIAGNOSIS — S0990XA Unspecified injury of head, initial encounter: Secondary | ICD-10-CM | POA: Insufficient documentation

## 2019-05-16 DIAGNOSIS — Z87891 Personal history of nicotine dependence: Secondary | ICD-10-CM | POA: Insufficient documentation

## 2019-05-16 DIAGNOSIS — I7121 Aneurysm of the ascending aorta, without rupture: Secondary | ICD-10-CM

## 2019-05-16 DIAGNOSIS — I712 Thoracic aortic aneurysm, without rupture: Secondary | ICD-10-CM | POA: Diagnosis not present

## 2019-05-16 DIAGNOSIS — S4352XA Sprain of left acromioclavicular joint, initial encounter: Secondary | ICD-10-CM | POA: Insufficient documentation

## 2019-05-16 DIAGNOSIS — S299XXA Unspecified injury of thorax, initial encounter: Secondary | ICD-10-CM | POA: Diagnosis not present

## 2019-05-16 DIAGNOSIS — Z7982 Long term (current) use of aspirin: Secondary | ICD-10-CM | POA: Insufficient documentation

## 2019-05-16 DIAGNOSIS — Y92410 Unspecified street and highway as the place of occurrence of the external cause: Secondary | ICD-10-CM | POA: Insufficient documentation

## 2019-05-16 DIAGNOSIS — Z7902 Long term (current) use of antithrombotics/antiplatelets: Secondary | ICD-10-CM | POA: Insufficient documentation

## 2019-05-16 DIAGNOSIS — Z79899 Other long term (current) drug therapy: Secondary | ICD-10-CM | POA: Diagnosis not present

## 2019-05-16 DIAGNOSIS — Z7984 Long term (current) use of oral hypoglycemic drugs: Secondary | ICD-10-CM | POA: Insufficient documentation

## 2019-05-16 DIAGNOSIS — S4992XA Unspecified injury of left shoulder and upper arm, initial encounter: Secondary | ICD-10-CM | POA: Diagnosis not present

## 2019-05-16 DIAGNOSIS — I10 Essential (primary) hypertension: Secondary | ICD-10-CM | POA: Diagnosis not present

## 2019-05-16 DIAGNOSIS — R52 Pain, unspecified: Secondary | ICD-10-CM | POA: Diagnosis not present

## 2019-05-16 DIAGNOSIS — I719 Aortic aneurysm of unspecified site, without rupture: Secondary | ICD-10-CM | POA: Diagnosis not present

## 2019-05-16 DIAGNOSIS — S3991XA Unspecified injury of abdomen, initial encounter: Secondary | ICD-10-CM | POA: Diagnosis not present

## 2019-05-16 DIAGNOSIS — M25519 Pain in unspecified shoulder: Secondary | ICD-10-CM | POA: Diagnosis not present

## 2019-05-16 LAB — CBC
HCT: 37.4 % — ABNORMAL LOW (ref 39.0–52.0)
Hemoglobin: 11.9 g/dL — ABNORMAL LOW (ref 13.0–17.0)
MCH: 30.7 pg (ref 26.0–34.0)
MCHC: 31.8 g/dL (ref 30.0–36.0)
MCV: 96.6 fL (ref 80.0–100.0)
Platelets: 193 10*3/uL (ref 150–400)
RBC: 3.87 MIL/uL — ABNORMAL LOW (ref 4.22–5.81)
RDW: 13.9 % (ref 11.5–15.5)
WBC: 4.4 10*3/uL (ref 4.0–10.5)
nRBC: 0 % (ref 0.0–0.2)

## 2019-05-16 LAB — BASIC METABOLIC PANEL
Anion gap: 7 (ref 5–15)
BUN: 12 mg/dL (ref 8–23)
CO2: 27 mmol/L (ref 22–32)
Calcium: 9.8 mg/dL (ref 8.9–10.3)
Chloride: 102 mmol/L (ref 98–111)
Creatinine, Ser: 0.95 mg/dL (ref 0.61–1.24)
GFR calc Af Amer: >60 mL/min (ref >60)
GFR calc non Af Amer: >60 mL/min (ref >60)
Glucose, Bld: 126 mg/dL — ABNORMAL HIGH (ref 70–99)
Potassium: 4.3 mmol/L (ref 3.5–5.1)
Sodium: 136 mmol/L (ref 135–145)

## 2019-05-16 MED ORDER — SODIUM CHLORIDE (PF) 0.9 % IJ SOLN
INTRAMUSCULAR | Status: AC
  Filled 2019-05-16: qty 50

## 2019-05-16 MED ORDER — IOHEXOL 300 MG/ML  SOLN
100.0000 mL | Freq: Once | INTRAMUSCULAR | Status: AC | PRN
  Administered 2019-05-16: 100 mL via INTRAVENOUS

## 2019-05-16 NOTE — Discharge Instructions (Addendum)
Your thoracic ascending aorta is mildly enlarged at 4.6 cm.  Follow-up with cardiothoracic surgery for this. Use the shoulder sling as needed for comfort on the left shoulder.  Try to use the shoulder to keep good movement in it however. Follow-up with Dr. Virgina Jock to ensure good blood pressure control.
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
## 2019-05-28 ENCOUNTER — Ambulatory Visit: Payer: Medicare PPO | Admitting: Podiatry

## 2019-05-28 ENCOUNTER — Encounter: Payer: Self-pay | Admitting: Podiatry

## 2019-05-28 ENCOUNTER — Other Ambulatory Visit: Payer: Self-pay

## 2019-05-28 DIAGNOSIS — M79675 Pain in left toe(s): Secondary | ICD-10-CM | POA: Diagnosis not present

## 2019-05-28 DIAGNOSIS — M79674 Pain in right toe(s): Secondary | ICD-10-CM | POA: Diagnosis not present

## 2019-05-28 DIAGNOSIS — B351 Tinea unguium: Secondary | ICD-10-CM | POA: Insufficient documentation

## 2019-05-28 DIAGNOSIS — G629 Polyneuropathy, unspecified: Secondary | ICD-10-CM | POA: Diagnosis not present

## 2019-05-28 NOTE — Progress Notes (Signed)
Complaint:  Visit Type: Patient presents  to my office for preventative foot care services. Complaint: Patient states" my nails have grown long and thick and become painful to walk and wear shoes" Patient has been taking metformin but denies diabetes. The patient presents for preventative foot care services. Patient is taking plavix.  Podiatric Exam: Vascular: dorsalis pedis and posterior tibial pulses are palpable bilateral. Capillary return is immediate. Temperature gradient is WNL. Skin turgor WNL  Sensorium: Diminished Semmes Weinstein monofilament test. Normal tactile sensation bilaterally. Nail Exam: Pt has thick disfigured discolored nails with subungual debris noted bilateral entire nail hallux through fifth toenails Ulcer Exam: There is no evidence of ulcer or pre-ulcerative changes or infection. Orthopedic Exam: Muscle tone and strength are WNL. No limitations in general ROM. No crepitus or effusions noted. Foot type and digits show no abnormalities. Bony prominences are unremarkable. Skin: No Porokeratosis. No infection or ulcers  Diagnosis:  Onychomycosis, , Pain in right toe, pain in left toes,  Neuropathy  Treatment & Plan Procedures and Treatment: Consent by patient was obtained for treatment procedures.   Debridement of mycotic and hypertrophic toenails, 1 through 5 bilateral and clearing of subungual debris. No ulceration, no infection noted.  Return Visit-Office Procedure: Patient instructed to return to the office for a follow up visit 3 months for continued evaluation and treatment.    Gardiner Barefoot DPM

## 2019-06-02 ENCOUNTER — Other Ambulatory Visit: Payer: Self-pay

## 2019-06-02 ENCOUNTER — Encounter (HOSPITAL_COMMUNITY)
Admission: RE | Admit: 2019-06-02 | Discharge: 2019-06-02 | Disposition: A | Discharge status: Home / Self Care | Payer: Medicare PPO | Source: Ambulatory Visit | Attending: Urology | Admitting: Urology

## 2019-06-02 ENCOUNTER — Encounter: Payer: Self-pay | Admitting: Neurology

## 2019-06-02 ENCOUNTER — Ambulatory Visit (INDEPENDENT_AMBULATORY_CARE_PROVIDER_SITE_OTHER): Payer: Medicare PPO | Admitting: Neurology

## 2019-06-02 VITALS — BP 158/94 | Temp 97.3°F | Ht 68.0 in | Wt 240.0 lb

## 2019-06-02 DIAGNOSIS — I6381 Other cerebral infarction due to occlusion or stenosis of small artery: Secondary | ICD-10-CM | POA: Diagnosis not present

## 2019-06-02 DIAGNOSIS — K5730 Diverticulosis of large intestine without perforation or abscess without bleeding: Secondary | ICD-10-CM | POA: Diagnosis not present

## 2019-06-02 DIAGNOSIS — C61 Malignant neoplasm of prostate: Secondary | ICD-10-CM | POA: Diagnosis not present

## 2019-06-02 MED ORDER — TECHNETIUM TC 99M MEDRONATE IV KIT
20.0000 | PACK | Freq: Once | INTRAVENOUS | Status: AC | PRN
  Administered 2019-06-02: 21.3 via INTRAVENOUS

## 2019-06-02 NOTE — Progress Notes (Signed)
[facility] [address] [place] [address] [phone]       OFFICE CONSULT NOTE  [name] Date of Birth:  [birth year] Medical Record Number:  [id number]   Referring MD: Rosalin Hawking Reason for Referral: Stroke HPI: Stephen Flynn is a 85 year old African-American male seen today for initial office consultation visit for stroke.  He is accompanied by his wife.  History is obtained from them, review of electronic medical records and I personally reviewed imaging films in PACS.  He has past medical history of hypertension, hyperlipidemia and obesity who presented on 03/21/2019 to [facility] with 2-day history of right-sided weakness.  He noticed that while walking his right leg would give out and he fell.  He also had mild dysarthria and trouble speaking and some numbness in the right leg as well.  CT scan of the head on admission showed no acute abnormality but MRI scan showed punctate acute to early subacute infarcts in the left corona radiata and left insula.  There were also multiple chronic white matter infarcts in the left centrum semiovale, corona radiata and deep watershed area.  CT angiogram showed occlusion of the left middle cerebral artery in the proximal left M1 segment with moderate collateralization in the distal territories.  2D echo showed normal ejection fraction.  LDL cholesterol was 65 mg percent and hemoglobin A1c was 7.4.  Patient was started on aspirin and Plavix for 3 months due to intracranial stenosis/occlusion.  He went home and is finished home physical occupational therapy.  He is now ambulating with a cane.  He still feels his right leg drags at times and he cannot trust it.  Is however had no falls since he has been using a cane now to walk regularly.  Is tolerating aspirin and Plavix well without bruising or bleeding.  His blood pressure is well controlled at home though today it is elevated at 160/88 in office.  Is not been checking  his fasting sugars regularly with hemoglobin A1c in the hospital was slightly high at 7.4.  He has been compliant with his medications.  He has a family history of stroke in his dad.  Is tolerating statin well without muscle aches and pains.  He has no new complaints.  He states he has been eating healthy diet but has not been able to walk a lot due to his leg weakness. ROS:   14 system review of systems is positive for leg weakness, gait difficulty, back pain, joint pain, leg dragging and all other systems negative PMH:  Past Medical History:  Diagnosis Date   Anemia    Aortic insufficiency    Chest pain    H/O: GI bleed    Hyperlipidemia    Hypertension    Leg cramps    Stroke [facility])     Social History:  Social History   Socioeconomic History   Marital status: Married    Spouse name: Not on file   Number of children: Not on file   Years of education: Not on file   Highest education level: Not on file  Occupational History   Not on file  Social Needs   Financial resource strain: Not on file   Food insecurity    Worry: Not on file    Inability: Not on file   Transportation needs    Medical: No    Non-medical: No  Tobacco Use   Smoking status: Former Smoker   Smokeless tobacco: Never Used  Substance and Sexual Activity   Alcohol use: No   Drug use: No   Sexual activity: Not on file  Lifestyle   Physical activity    Days per week: Not on file    Minutes per session: Not on file   Stress: Not on file  Relationships   Social connections    Talks on phone: Not on file    Gets together: Not on file    Attends religious service: Not on file    Active member of club or organization: Not on file    Attends meetings of clubs or organizations: Not on file    Relationship status: Not on file   Intimate partner violence    Fear of current or ex partner: Not on file    Emotionally abused: Not on file    Physically abused: Not on file    Forced  sexual activity: Not on file  Other Topics Concern   Not on file  Social History Narrative   Not on file    Medications:   Current Outpatient Medications on File Prior to Visit  Medication Sig Dispense Refill   amLODipine (NORVASC) 5 MG tablet Take 5 mg by mouth daily.     aspirin EC 325 MG EC tablet Take 1 tablet (325 mg total) by mouth daily. 30 tablet 2   atorvastatin (LIPITOR) 80 MG tablet Take 1 tablet (80 mg total) by mouth daily at 6 PM. (Patient taking differently: Take 80 mg by mouth daily. ) 30 tablet 2   hydrALAZINE (APRESOLINE) 25 MG tablet Take 25 mg by mouth 2 (two) times daily.     ibuprofen (ADVIL) 200 MG tablet Take 400 mg by mouth every 6 (six) hours as needed for fever, headache or moderate pain.     metFORMIN (GLUCOPHAGE) 500 MG tablet Take 1 tablet (500 mg total) by mouth 2 (two) times daily with a meal. 60 tablet 11   Polyethyl Glycol-Propyl Glycol (SYSTANE FREE OP) Place 1 drop into both eyes daily as needed (dry eyes).      tamsulosin (FLOMAX) 0.4 MG CAPS capsule Take 0.4 mg by mouth.     No current facility-administered medications on file prior to visit.     Allergies:   Allergies  Allergen Reactions   Penicillins Hives    Did it involve swelling of the face/tongue/throat, SOB, or low BP? No Did it involve sudden or severe rash/hives, skin peeling, or any reaction on the inside of your mouth or nose? yes Did you need to seek medical attention at a hospital or doctor's office? yes When did it last happen?child If all above answers are NO, may proceed with cephalosporin use.    Physical Exam General: Obese elderly African-American male, seated, in no evident distress Head: head normocephalic and atraumatic.   Neck: supple with no carotid or supraclavicular bruits Cardiovascular: regular rate and rhythm, no murmurs Musculoskeletal: no deformity Skin:  no rash/petichiae Vascular:  Normal pulses all extremities  Neurologic Exam Mental  Status: Awake and fully alert. Oriented to place and time. Recent and remote memory intact. Attention span, concentration and fund of knowledge appropriate. Mood and affect appropriate.  Cranial Nerves: Fundoscopic exam reveals sharp disc margins. Pupils equal, briskly reactive to light. Extraocular movements full without nystagmus. Visual fields full to confrontation. Hearing intact. Facial sensation intact.  Mild right lower facial asymmetry when he smiles.  Tongue, palate moves normally and symmetrically.  Motor: Normal bulk and tone. Normal strength in all tested extremity  muscles.  Diminished fine finger movements on the right.  Orbits left over right upper extremity.  Mild weakness of right ankle dorsiflexors only.  Sensory.: intact to touch , pinprick , position and vibratory sensation.  Coordination: Rapid alternating movements normal in all extremities. Finger-to-nose and heel-to-shin performed accurately bilaterally. Gait and Station: Arises from chair with mild  difficulty. Stance is normal. Gait demonstrates favoring of the right leg with some dragging..  Unable to heel, toe and tandem walk  .  Reflexes: 1+ and symmetric. Toes downgoing.   NIHSS  1 Modified Rankin  2   ASSESSMENT: 85 year old African-American gentleman with left brain subcortical lacunar infarct in September 2020 from small vessel disease.  Vascular risk factors of diabetes, hypertension, hyperlipidemia, intracranial atherosclerosis, age and mild obesity.    PLAN: I had a long d/w patient and his wife about his recent lacunar stroke, risk for recurrent stroke/TIAs, personally independently reviewed imaging studies and stroke evaluation results and answered questions.Continue aspirin alone and stop plavix as it has been nearly 3 months since his stroke for secondary stroke prevention and maintain strict control of hypertension with blood pressure goal below 130/90, diabetes with hemoglobin A1c goal below 6.5% and lipids  with LDL cholesterol goal below 70 mg/dL. I also advised the patient to eat a healthy diet with plenty of whole grains, cereals, fruits and vegetables, exercise regularly and maintain ideal body weight .greater than 50% time during this 45-minute visit was spent on counseling and coordination of care about his lacunar stroke and discussion about stroke prevention and treatment and answering questions.  Followup in the future with my nurse practitioner Janett Billow in 3 months or call earlier if necessary.  Antony Contras, MD  [facility] [address] [place], [place] [address]  Phone [phone] Fax [phone] Note: This document was prepared with digital dictation and possible smart phrase technology. Any transcriptional errors that result from this process are unintentional.

## 2019-06-02 NOTE — Patient Instructions (Signed)
I had a long d/w patient and his wife about his recent lacunar stroke, risk for recurrent stroke/TIAs, personally independently reviewed imaging studies and stroke evaluation results and answered questions.Continue aspirin alone and stop plavix  for secondary stroke prevention and maintain strict control of hypertension with blood pressure goal below 130/90, diabetes with hemoglobin A1c goal below 6.5% and lipids with LDL cholesterol goal below 70 mg/dL. I also advised the patient to eat a healthy diet with plenty of whole grains, cereals, fruits and vegetables, exercise regularly and maintain ideal body weight Followup in the future with my nurse practitioner Janett Billow in 3 months or call earlier if necessary.  Stroke Prevention Some medical conditions and behaviors are associated with a higher chance of having a stroke. You can help prevent a stroke by making nutrition, lifestyle, and other changes, including managing any medical conditions you may have. What nutrition changes can be made?   Eat healthy foods. You can do this by: ? Choosing foods high in fiber, such as fresh fruits and vegetables and whole grains. ? Eating at least 5 or more servings of fruits and vegetables a day. Try to fill half of your plate at each meal with fruits and vegetables. ? Choosing lean protein foods, such as lean cuts of meat, poultry without skin, fish, tofu, beans, and nuts. ? Eating low-fat dairy products. ? Avoiding foods that are high in salt (sodium). This can help lower blood pressure. ? Avoiding foods that have saturated fat, trans fat, and cholesterol. This can help prevent high cholesterol. ? Avoiding processed and premade foods.  Follow your health care provider's specific guidelines for losing weight, controlling high blood pressure (hypertension), lowering high cholesterol, and managing diabetes. These may include: ? Reducing your daily calorie intake. ? Limiting your daily sodium intake to 1,500  milligrams (mg). ? Using only healthy fats for cooking, such as olive oil, canola oil, or sunflower oil. ? Counting your daily carbohydrate intake. What lifestyle changes can be made?  Maintain a healthy weight. Talk to your health care provider about your ideal weight.  Get at least 30 minutes of moderate physical activity at least 5 days a week. Moderate activity includes brisk walking, biking, and swimming.  Do not use any products that contain nicotine or tobacco, such as cigarettes and e-cigarettes. If you need help quitting, ask your health care provider. It may also be helpful to avoid exposure to secondhand smoke.  Limit alcohol intake to no more than 1 drink a day for nonpregnant women and 2 drinks a day for men. One drink equals 12 oz of beer, 5 oz of wine, or 1 oz of hard liquor.  Stop any illegal drug use.  Avoid taking birth control pills. Talk to your health care provider about the risks of taking birth control pills if: ? You are over 36 years old. ? You smoke. ? You get migraines. ? You have ever had a blood clot. What other changes can be made?  Manage your cholesterol levels. ? Eating a healthy diet is important for preventing high cholesterol. If cholesterol cannot be managed through diet alone, you may also need to take medicines. ? Take any prescribed medicines to control your cholesterol as told by your health care provider.  Manage your diabetes. ? Eating a healthy diet and exercising regularly are important parts of managing your blood sugar. If your blood sugar cannot be managed through diet and exercise, you may need to take medicines. ? Take any prescribed medicines  to control your diabetes as told by your health care provider.  Control your hypertension. ? To reduce your risk of stroke, try to keep your blood pressure below 130/80. ? Eating a healthy diet and exercising regularly are an important part of controlling your blood pressure. If your blood  pressure cannot be managed through diet and exercise, you may need to take medicines. ? Take any prescribed medicines to control hypertension as told by your health care provider. ? Ask your health care provider if you should monitor your blood pressure at home. ? Have your blood pressure checked every year, even if your blood pressure is normal. Blood pressure increases with age and some medical conditions.  Get evaluated for sleep disorders (sleep apnea). Talk to your health care provider about getting a sleep evaluation if you snore a lot or have excessive sleepiness.  Take over-the-counter and prescription medicines only as told by your health care provider. Aspirin or blood thinners (antiplatelets or anticoagulants) may be recommended to reduce your risk of forming blood clots that can lead to stroke.  Make sure that any other medical conditions you have, such as atrial fibrillation or atherosclerosis, are managed. What are the warning signs of a stroke? The warning signs of a stroke can be easily remembered as BEFAST.  B is for balance. Signs include: ? Dizziness. ? Loss of balance or coordination. ? Sudden trouble walking.  E is for eyes. Signs include: ? A sudden change in vision. ? Trouble seeing.  F is for face. Signs include: ? Sudden weakness or numbness of the face. ? The face or eyelid drooping to one side.  A is for arms. Signs include: ? Sudden weakness or numbness of the arm, usually on one side of the body.  S is for speech. Signs include: ? Trouble speaking (aphasia). ? Trouble understanding.  T is for time. ? These symptoms may represent a serious problem that is an emergency. Do not wait to see if the symptoms will go away. Get medical help right away. Call your local emergency services (911 in the [place] Do not drive yourself to the hospital.  Other signs of stroke may include: ? A sudden, severe headache with no known cause. ? Nausea or vomiting. ?  Seizure. Where to find more information For more information, visit:  American Stroke Association: www.strokeassociation.org  National Stroke Association: www.stroke.org Summary  You can prevent a stroke by eating healthy, exercising, not smoking, limiting alcohol intake, and managing any medical conditions you may have.  Do not use any products that contain nicotine or tobacco, such as cigarettes and e-cigarettes. If you need help quitting, ask your health care provider. It may also be helpful to avoid exposure to secondhand smoke.  Remember BEFAST for warning signs of stroke. Get help right away if you or a loved one has any of these signs. This information is not intended to replace advice given to you by your health care provider. Make sure you discuss any questions you have with your health care provider. Document Released: 07/27/2004 Document Revised: 06/01/2017 Document Reviewed: 07/25/2016 Elsevier Patient Education  2020 [employer].

## 2019-06-04 ENCOUNTER — Other Ambulatory Visit: Payer: Self-pay

## 2019-06-04 NOTE — Patient Outreach (Addendum)
[place] [facility]) Care Management  06/04/2019  Orbie Grupe [birth year] [id number]   Telephone Assessment   Outreach attempt #1 to patient. Spoke with patient who denies any issues or concerns at present. He states that he went to stroke MD earlier this week and had some scans done. He does not know results yet. He voices that he is continuing to improve and recover from stroke. He is walking and ambulating a little bit around the home. He denies any recent falls. He states that he is using cane. Appetite remains fair. Patient states that he is staying in the house and not going out much due to COVID-19. He is adhering to safety guidelines.He denies any RN CM needs or concerns at present.   THN CM Care Plan Problem One     Most Recent Value  Care Plan Problem One  Patient at hish risk for readmission due to co-morbidities.  Role Documenting the Problem One  Care Management Telephonic [place] for Problem One  Active  [facility] Long Term Goal   Patient will have no hospital readmissions within the next 31 days.  THN Long Term Goal Start Date  04/02/19  THN Long Term Goal Met Date  06/04/19  [facility] CM Short Term Goal #1   Patient will complete all post discharge MD appts within the next 30 days.  THN CM Short Term Goal #1 Start Date  04/02/19  THN CM Short Term Goal #1 Met Date  06/04/19  THN CM Short Term Goal #2   Patient will report improvement in right side weakness, mobility and dextrity over the next 30 days.  THN CM Short Term Goal #2 Start Date  04/02/19  [facility] CM Short Term Goal #2 Met Date  06/04/19    [facility] CM Care Plan Problem Two     Most Recent Value  Care Plan Problem Two  Patient has not seen a dentist in over a year.  Role Documenting the Problem Two  Care Management [place] for Problem Two  Active  THN Long Term Goal  Patient will schedule a dental appt for routine care over the next 31 days.  THN Long Term Goal Start Date   04/02/19    [facility] CM Care Plan Problem Three     Most Recent Value  Care Plan Problem Three  -- [Patient with impaired speech due to stroke side effects.]  Role Documenting the Problem Three  Care Management Telephonic Coordinator  Care Plan for Problem Three  Active  [facility] Long Term Goal   Patient will report improvement in speech over the next 60 days.  THN Long Term Goal Start Date  04/09/19  THN Long Term Goal Met Date  06/04/19  [facility] CM Short Term Goal #1   Patient will verbalize actively participating with [place] for speech improvement over the next 30 days.  THN CM Short Term Goal #1 Start Date  04/09/19  THN CM Short Term Goal #1 Met Date  05/08/19  THN CM Short Term Goal #2   Patient will report no falls in the home over the next 31 days.  THN CM Short Term Goal #2 Start Date  06/04/19  Interventions for Short Term Goal #2  RNCM discussed with pt fall/safety measures and prevention measures.      Plan: RN CM discussed with patient next outreach within the month of January. Patient gave verbal consent and in agreement with RN CM follow up and  timeframe. Patient aware that they may contact RN CM sooner for any issues or concerns. RN CM will send quarterly update to PCP.  Enzo Montgomery, RN,BSN,CCM [place] Management Telephonic Care Management Coordinator Direct Phone: [phone] Toll Free: [phone] Fax: [phone]

## 2019-06-05 ENCOUNTER — Ambulatory Visit: Payer: Self-pay

## 2019-06-10 DIAGNOSIS — C61 Malignant neoplasm of prostate: Secondary | ICD-10-CM | POA: Diagnosis not present

## 2019-07-09 ENCOUNTER — Other Ambulatory Visit: Payer: Self-pay

## 2019-07-09 NOTE — Patient Outreach (Signed)
[facility]) Care Management  07/09/2019  Tarry Delvalle Apr 30, 1928 [id number]   Telephone Assessment   Outreach attempt #1 to patient.No answer after several rings.      Plan: RN CM will make outreach attempt to patient within the month of February.   Enzo Montgomery, RN,BSN,CCM [place] Management Telephonic Care Management Coordinator Direct Phone: [phone] Toll Free: [phone] Fax: [phone]

## 2019-07-10 ENCOUNTER — Ambulatory Visit: Payer: Self-pay

## 2019-07-29 DIAGNOSIS — H524 Presbyopia: Secondary | ICD-10-CM | POA: Diagnosis not present

## 2019-07-29 DIAGNOSIS — H2513 Age-related nuclear cataract, bilateral: Secondary | ICD-10-CM | POA: Diagnosis not present

## 2019-07-29 DIAGNOSIS — E119 Type 2 diabetes mellitus without complications: Secondary | ICD-10-CM | POA: Diagnosis not present

## 2019-08-05 ENCOUNTER — Other Ambulatory Visit: Payer: Self-pay

## 2019-08-05 NOTE — Patient Outreach (Signed)
[place] [facility]) Care Management  08/05/2019  Stephen Flynn 27-Feb-1928 [id number]   Telephone Assessment   Outreach attempt #1 to patient. Spoke with patient who denies any acute issues or concerns at present. He reports that things have been going "slow but well." Patient voices some frustration as he feels like he should be stronger and back to his normal self by now. RN CM discussed with patient that he had a stroke and very serious illness and given his age and other health issues that it would take some time to return to baseline. He does admit that he is making progress but feels like it should be faster. He states that he went to [place] MD a few days ago and was told the same thing. MD recommended patient start using walker instead of cane. He reports one has been ordered and he is awaiting for it to be delivered. He denies any recnt falls. Appetite remains good for him. He has supportive spouse that continues to assist him as needed. He goes back for neuro follow up next month. He denies any RN CM needs or concerns at this time.   THN CM Care Plan Problem One     Most Recent Value  Care Plan Problem One  Patient at hish risk for readmission due to co-morbidities.  Role Documenting the Problem One  Care Management Telephonic Coordinator  Care Plan for Problem One  Not Active    [facility] CM Care Plan Problem Two     Most Recent Value  Care Plan Problem Two  Patient has not seen a dentist in over a year.  Role Documenting the Problem Two  Care Management Telephonic [place] for Problem Two  Active  Interventions for Problem Two Long Term Goal   RN CM discussed with pt.-he wishes to get stronger and other health issues under control first  [place] Term Goal  Patient will schedule a dental appt for routine care over the next 31 days.  THN Long Term Goal Start Date  04/02/19    [facility] CM Care Plan Problem Three     Most Recent Value  Care Plan Problem Three  -- [Patient  with impaired speech due to stroke side effects.]  Role Documenting the Problem Three  Care Management Telephonic Coordinator  Care Plan for Problem Three  Active  THN Long Term Goal   Patient will report no falls in the home within the next 60 days.  THN Long Term Goal Start Date  08/05/19  Interventions for Problem Three Long Term Goal  RN CM completed falls/safety eval. RN CM reinforced safety measures education to pt. RN CM assessed for adequate DME and assistance in the home.   THN CM Short Term Goal #1   Patient will report obtaining and using walker/DME in the home over the next 30 days  THN CM Short Term Goal #1 Start Date  08/05/19  Interventions for Short Term Goal #1  RNCM completed falls/safety eval. RN CM reinforced fall/safety education to pt and importance of using DME.  THN CM Short Term Goal #2   Patient will report no falls in the home over the next 31 days.  THN CM Short Term Goal #2 Start Date  06/04/19  [facility] CM Short Term Goal #2 Met Date  08/05/19      Plan: RN CM discussed with patient next outreach within the month of April. Patient gave verbal consent and in agreement with RN CM follow up  and timeframe. Patient aware that they may contact RN CM sooner for any issues or concerns.   Enzo Montgomery, RN,BSN,CCM [place] Management Telephonic Care Management Coordinator Direct Phone: [phone] Toll Free: [phone] Fax: [phone]

## 2019-08-06 DIAGNOSIS — E119 Type 2 diabetes mellitus without complications: Secondary | ICD-10-CM | POA: Diagnosis not present

## 2019-08-06 DIAGNOSIS — Z0000 Encounter for general adult medical examination without abnormal findings: Secondary | ICD-10-CM | POA: Diagnosis not present

## 2019-08-06 DIAGNOSIS — E7849 Other hyperlipidemia: Secondary | ICD-10-CM | POA: Diagnosis not present

## 2019-08-07 ENCOUNTER — Ambulatory Visit: Payer: Self-pay

## 2019-08-12 DIAGNOSIS — R4701 Aphasia: Secondary | ICD-10-CM | POA: Diagnosis not present

## 2019-08-12 DIAGNOSIS — Z0000 Encounter for general adult medical examination without abnormal findings: Secondary | ICD-10-CM | POA: Diagnosis not present

## 2019-08-12 DIAGNOSIS — R2689 Other abnormalities of gait and mobility: Secondary | ICD-10-CM | POA: Diagnosis not present

## 2019-08-12 DIAGNOSIS — I6990 Unspecified sequelae of unspecified cerebrovascular disease: Secondary | ICD-10-CM | POA: Diagnosis not present

## 2019-08-12 DIAGNOSIS — I5189 Other ill-defined heart diseases: Secondary | ICD-10-CM | POA: Diagnosis not present

## 2019-08-12 DIAGNOSIS — I712 Thoracic aortic aneurysm, without rupture: Secondary | ICD-10-CM | POA: Diagnosis not present

## 2019-08-12 DIAGNOSIS — I700 Atherosclerosis of aorta: Secondary | ICD-10-CM | POA: Diagnosis not present

## 2019-08-12 DIAGNOSIS — H269 Unspecified cataract: Secondary | ICD-10-CM | POA: Diagnosis not present

## 2019-08-12 DIAGNOSIS — Z1331 Encounter for screening for depression: Secondary | ICD-10-CM | POA: Diagnosis not present

## 2019-08-12 DIAGNOSIS — I719 Aortic aneurysm of unspecified site, without rupture: Secondary | ICD-10-CM | POA: Diagnosis not present

## 2019-08-27 ENCOUNTER — Ambulatory Visit: Payer: Medicare PPO | Admitting: Podiatry

## 2019-09-03 ENCOUNTER — Other Ambulatory Visit: Payer: Self-pay

## 2019-09-03 ENCOUNTER — Ambulatory Visit: Payer: Medicare PPO | Admitting: Adult Health

## 2019-09-03 ENCOUNTER — Encounter: Payer: Self-pay | Admitting: Adult Health

## 2019-09-03 VITALS — BP 138/82 | HR 70 | Temp 97.3°F | Ht 68.0 in | Wt 245.6 lb

## 2019-09-03 DIAGNOSIS — I10 Essential (primary) hypertension: Secondary | ICD-10-CM

## 2019-09-03 DIAGNOSIS — E785 Hyperlipidemia, unspecified: Secondary | ICD-10-CM

## 2019-09-03 DIAGNOSIS — I639 Cerebral infarction, unspecified: Secondary | ICD-10-CM | POA: Diagnosis not present

## 2019-09-03 NOTE — Progress Notes (Signed)
[facility] [address] [place]. [place] [address] [phone]       OFFICE FOLLOW UP NOTE  [name] Date of Birth:  04/09/1928 Medical Record Number:  [id number]   Referring MD: Rosalin Hawking Reason for Referral: Stroke  Chief Complaint  Patient presents with  . Follow-up    3 mon f/u. Alone. Treatment room. Patient mentioned that he doesn't feel that his right leg is getting any better. He would like to know can he take Advil or Tylenol for his leg.     HPI:  Stephen Flynn is a 86 year old male who is being seen today, 09/03/2019, for stroke follow-up.  He has been doing well since prior visit with residual right leg weakness with mild improvement.  He does report adequate exercise throughout the day but does not routinely do recommended exercises by therapy.  Continues to ambulate with a cane and denies any recent falls.  He does report occasional tightness sensation right anterior thigh.  Continues on aspirin and atorvastatin for secondary stroke prevention without side effects.  Blood pressure today 138/82.  No further concerns at this time.     History copied for reference purposes only Stephen Flynn is a 42 year old African-American male seen today for initial office consultation visit for stroke.  He is accompanied by his wife.  History is obtained from them, review of electronic medical records and I personally reviewed imaging films in PACS.  He has past medical history of hypertension, hyperlipidemia and obesity who presented on 03/21/2019 to [facility] with 2-day history of right-sided weakness.  He noticed that while walking his right leg would give out and he fell.  He also had mild dysarthria and trouble speaking and some numbness in the right leg as well.  CT scan of the head on admission showed no acute abnormality but MRI scan showed punctate acute to early subacute infarcts in the left corona radiata and left insula.  There were also multiple  chronic white matter infarcts in the left centrum semiovale, corona radiata and deep watershed area.  CT angiogram showed occlusion of the left middle cerebral artery in the proximal left M1 segment with moderate collateralization in the distal territories.  2D echo showed normal ejection fraction.  LDL cholesterol was 65 mg percent and hemoglobin A1c was 7.4.  Patient was started on aspirin and Plavix for 3 months due to intracranial stenosis/occlusion.  He went home and is finished home physical occupational therapy.  He is now ambulating with a cane.  He still feels his right leg drags at times and he cannot trust it.  Is however had no falls since he has been using a cane now to walk regularly.  Is tolerating aspirin and Plavix well without bruising or bleeding.  His blood pressure is well controlled at home though today it is elevated at 160/88 in office.  Is not been checking his fasting sugars regularly with hemoglobin A1c in the hospital was slightly high at 7.4.  He has been compliant with his medications.  He has a family history of stroke in his dad.  Is tolerating statin well without muscle aches and pains.  He has no new complaints.  He states he has been eating healthy diet but has not been able to walk a lot due to his leg weakness.    ROS:   14 system review of systems is positive for leg weakness, gait impairment and pain and all other systems negative  PMH:  Past Medical History:  Diagnosis Date  . Anemia   . Aortic insufficiency   . Chest pain   . H/O: GI bleed   . Hyperlipidemia   . Hypertension   . Leg cramps   . Stroke [facility])     Social History:  Social History   Socioeconomic History  . Marital status: Married    Spouse name: Not on file  . Number of children: Not on file  . Years of education: Not on file  . Highest education level: Not on file  Occupational History  . Not on file  Tobacco Use  . Smoking status: Former Research scientist (life sciences)  . Smokeless tobacco: Never Used    Substance and Sexual Activity  . Alcohol use: No  . Drug use: No  . Sexual activity: Not on file  Other Topics Concern  . Not on file  Social History Narrative  . Not on file   Social Determinants of Health   Financial Resource Strain:   . Difficulty of Paying Living Expenses: Not on file  Food Insecurity:   . Worried About Charity fundraiser in the Last Year: Not on file  . Ran Out of Food in the Last Year: Not on file  Transportation Needs: No Transportation Needs  . Lack of Transportation (Medical): No  . Lack of Transportation (Non-Medical): No  Physical Activity:   . Days of Exercise per Week: Not on file  . Minutes of Exercise per Session: Not on file  Stress:   . Feeling of Stress : Not on file  Social Connections:   . Frequency of Communication with Friends and Family: Not on file  . Frequency of Social Gatherings with Friends and Family: Not on file  . Attends Religious Services: Not on file  . Active Member of Clubs or Organizations: Not on file  . Attends Archivist Meetings: Not on file  . Marital Status: Not on file  Intimate Partner Violence:   . Fear of Current or Ex-Partner: Not on file  . Emotionally Abused: Not on file  . Physically Abused: Not on file  . Sexually Abused: Not on file    Medications:   Current Outpatient Medications on File Prior to Visit  Medication Sig Dispense Refill  . amLODipine (NORVASC) 5 MG tablet Take 5 mg by mouth daily.    . hydrALAZINE (APRESOLINE) 25 MG tablet Take 25 mg by mouth 2 (two) times daily.    [name] Kitchen ibuprofen (ADVIL) 200 MG tablet Take 400 mg by mouth every 6 (six) hours as needed for fever, headache or moderate pain.    . metFORMIN (GLUCOPHAGE) 500 MG tablet Take 1 tablet (500 mg total) by mouth 2 (two) times daily with a meal. 60 tablet 11  . Polyethyl Glycol-Propyl Glycol (SYSTANE FREE OP) Place 1 drop into both eyes daily as needed (dry eyes).     . tamsulosin (FLOMAX) 0.4 MG CAPS capsule Take 0.4 mg  by mouth.    [name] Kitchen atorvastatin (LIPITOR) 80 MG tablet Take 1 tablet (80 mg total) by mouth daily at 6 PM. (Patient taking differently: Take 80 mg by mouth daily. ) 30 tablet 2   No current facility-administered medications on file prior to visit.    Allergies:   Allergies  Allergen Reactions  . Penicillins Hives    Did it involve swelling of the face/tongue/throat, SOB, or low BP? No Did it involve sudden or severe rash/hives, skin peeling, or any reaction on the inside of your mouth  or nose? yes Did you need to seek medical attention at a hospital or doctor's office? yes When did it last happen?child If all above answers are "NO", may proceed with cephalosporin use.    Physical Exam  Today's Vitals   09/03/19 1536  BP: 138/82  Pulse: 70  Temp: (!) 97.3 F (36.3 C)  TempSrc: Oral  Weight: 245 lb 9.6 oz (111.4 kg)  Height: 5\' 8"  (1.727 m)   Body mass index is 37.34 kg/m.   General: Obese pleasant elderly African-American male, seated, in no evident distress Head: head normocephalic and atraumatic.   Neck: supple with no carotid or supraclavicular bruits Cardiovascular: regular rate and rhythm, no murmurs Musculoskeletal: no deformity Skin:  no rash/petichiae Vascular:  Normal pulses all extremities  Neurologic Exam Mental Status: Awake and fully alert.  Normal speech and language.  Oriented to place and time. Recent and remote memory intact. Attention span, concentration and fund of knowledge appropriate. Mood and affect appropriate.  Cranial Nerves: Pupils equal, briskly reactive to light. Extraocular movements full without nystagmus. Visual fields full to confrontation. Hearing intact. Facial sensation intact.  Mild right lower facial asymmetry when he smiles.  Tongue, palate moves normally and symmetrically.  Motor: Normal bulk and tone. Normal strength in all tested extremity muscles except mildly decreased right hip flexor and right ankle dorsiflexion  Sensory.:  intact to touch , pinprick , position and vibratory sensation.  Coordination: Rapid alternating movements normal in all extremities. Finger-to-nose and heel-to-shin performed accurately bilaterally. Gait and Station: Arises from chair with mild  difficulty. Stance is normal. Gait demonstrates favoring of the right leg with some dragging and use of cane.  Unable to heel, toe and tandem walk  .  Reflexes: 1+ and symmetric. Toes downgoing.      ASSESSMENT: 86 year old African-American gentleman with left brain subcortical lacunar infarct in September 2020 from small vessel disease.  Vascular risk factors of diabetes, hypertension, hyperlipidemia, intracranial atherosclerosis, age and mild obesity.  Residual deficit of mild right leg weakness    PLAN:  -Continue aspirin 81 mg daily atorvastatin for secondary stroke prevention -Continue to follow with PCP for HTN, HLD and DM management -Advised use of Tylenol as needed, heat and massage on right leg likely muscle strain.  Advised if continues, to follow-up with PCP for further evaluation - maintain strict control of hypertension with blood pressure goal below 130/90, diabetes with hemoglobin A1c goal below 6.5% and lipids with LDL cholesterol goal below 70 mg/dL. I also advised the patient to eat a healthy diet with plenty of whole grains, cereals, fruits and vegetables, exercise regularly and maintain ideal body weight  Follow-up in 6 months or call earlier if needed  I spent 25 minutes of face-to-face and non-face-to-face time with patient.  This included previsit chart review, lab review, study review, order entry, electronic health record documentation, patient education   Frann Rider, [facility]  [facility] [address] [place], [place] [address]  Phone [phone] Fax [phone] Note: This document was prepared with digital dictation and possible smart phrase technology. Any transcriptional errors  that result from this process are unintentional.

## 2019-09-03 NOTE — Patient Instructions (Signed)
Continue aspirin 81 mg daily  and lipitor  for secondary stroke prevention  Continue to follow up with PCP regarding cholesterol, blood pressure and diabetes management   Use tylenol as needed for leg pain, use heat and massage with tennis ball  Increase activity as tolerated along with doing daily exercises as recommended during therapy sessions  Continue to monitor blood pressure at home  Maintain strict control of hypertension with blood pressure goal below 130/90, diabetes with hemoglobin A1c goal below 6.5% and cholesterol with LDL cholesterol (bad cholesterol) goal below 70 mg/dL. I also advised the patient to eat a healthy diet with plenty of whole grains, cereals, fruits and vegetables, exercise regularly and maintain ideal body weight.  Followup in the future with me in 6 months or call earlier if needed       Thank you for coming to see [place] at [facility]. I hope we have been able to provide you high quality care today.  You may receive a patient satisfaction survey over the next few weeks. We would appreciate your feedback and comments so that we may continue to improve ourselves and the health of our patients.

## 2019-09-04 ENCOUNTER — Encounter: Payer: Self-pay | Admitting: Adult Health

## 2019-09-04 NOTE — Progress Notes (Signed)
I agree with the above plan 

## 2019-09-05 ENCOUNTER — Encounter: Payer: Self-pay | Admitting: Podiatry

## 2019-09-05 ENCOUNTER — Ambulatory Visit (INDEPENDENT_AMBULATORY_CARE_PROVIDER_SITE_OTHER): Payer: Medicare PPO | Admitting: Podiatry

## 2019-09-05 ENCOUNTER — Other Ambulatory Visit: Payer: Self-pay

## 2019-09-05 VITALS — Temp 97.3°F

## 2019-09-05 DIAGNOSIS — L600 Ingrowing nail: Secondary | ICD-10-CM

## 2019-09-05 DIAGNOSIS — M79674 Pain in right toe(s): Secondary | ICD-10-CM

## 2019-09-05 DIAGNOSIS — M79675 Pain in left toe(s): Secondary | ICD-10-CM | POA: Diagnosis not present

## 2019-09-05 DIAGNOSIS — B351 Tinea unguium: Secondary | ICD-10-CM | POA: Diagnosis not present

## 2019-09-05 DIAGNOSIS — G629 Polyneuropathy, unspecified: Secondary | ICD-10-CM

## 2019-09-05 NOTE — Patient Instructions (Addendum)
EPSOM SALT FOOT SOAK INSTRUCTIONS  Shopping List:  A. Plain epsom salt (not scented) B. Neosporin Cream/Ointment or Bacitracin Cream/Ointment C. 1-inch fabric band-aids   1.  Place 1/4 cup of epsom salts in 2 quarts of warm tap water. IF YOU ARE DIABETIC, OR HAVE NEUROPATHY, CHECK THE TEMPERATURE OF THE WATER WITH YOUR ELBOW.  2.  Submerge your foot/feet in the solution and soak for 10-15 minutes.      3.  Next, remove your foot or feet from solution, blot dry the affected area.    4.  Apply antibiotic ointment and cover with fabric band-aid .  5.  This soak should be done once a day for 7 days.   6.  Monitor for any signs/symptoms of infection such as redness, swelling, odor, drainage, increased pain, or non-healing of digit.   7.  Please do not hesitate to call the office and speak to a Nurse or Doctor if you have questions.   8.  If you experience fever, chills, nightsweats, nausea or vomiting with worsening of digit, please go to the emergency room.    Peripheral Neuropathy Peripheral neuropathy is a type of nerve damage. It affects nerves that carry signals between the spinal cord and the arms, legs, and the rest of the body (peripheral nerves). It does not affect nerves in the spinal cord or brain. In peripheral neuropathy, one nerve or a group of nerves may be damaged. Peripheral neuropathy is a broad category that includes many specific nerve disorders, like diabetic neuropathy, hereditary neuropathy, and carpal tunnel syndrome. What are the causes? This condition may be caused by:  Diabetes. This is the most common cause of peripheral neuropathy.  Nerve injury.  Pressure or stress on a nerve that lasts a long time.  Lack (deficiency) of B vitamins. This can result from alcoholism, poor diet, or a restricted diet.  Infections.  Autoimmune diseases, such as rheumatoid arthritis and systemic lupus erythematosus.  Nerve diseases that are passed from parent to child  (inherited).  Some medicines, such as cancer medicines (chemotherapy).  Poisonous (toxic) substances, such as lead and mercury.  Too little blood flowing to the legs.  Kidney disease.  Thyroid disease. In some cases, the cause of this condition is not known. What are the signs or symptoms? Symptoms of this condition depend on which of your nerves is damaged. Common symptoms include:  Loss of feeling (numbness) in the feet, hands, or both.  Tingling in the feet, hands, or both.  Burning pain.  Very sensitive skin.  Weakness.  Not being able to move a part of the body (paralysis).  Muscle twitching.  Clumsiness or poor coordination.  Loss of balance.  Not being able to control your bladder.  Feeling dizzy.  Sexual problems. How is this diagnosed? Diagnosing and finding the cause of peripheral neuropathy can be difficult. Your health care provider will take your medical history and do a physical exam. A neurological exam will also be done. This involves checking things that are affected by your brain, spinal cord, and nerves (nervous system). For example, your health care provider will check your reflexes, how you move, and what you can feel. You may have other tests, such as:  Blood tests.  Electromyogram (EMG) and nerve conduction tests. These tests check nerve function and how well the nerves are controlling the muscles.  Imaging tests, such as CT scans or MRI to rule out other causes of your symptoms.  Removing a small piece of nerve   to be examined in a lab (nerve biopsy). This is rare.  Removing and examining a small amount of the fluid that surrounds the brain and spinal cord (lumbar puncture). This is rare. How is this treated? Treatment for this condition may involve:  Treating the underlying cause of the neuropathy, such as diabetes, kidney disease, or vitamin deficiencies.  Stopping medicines that can cause neuropathy, such as chemotherapy.  Medicine  to relieve pain. Medicines may include: ? Prescription or over-the-counter pain medicine. ? Antiseizure medicine. ? Antidepressants. ? Pain-relieving patches that are applied to painful areas of skin.  Surgery to relieve pressure on a nerve or to destroy a nerve that is causing pain.  Physical therapy to help improve movement and balance.  Devices to help you move around (assistive devices). Follow these instructions at home: Medicines  Take over-the-counter and prescription medicines only as told by your health care provider. Do not take any other medicines without first asking your health care provider.  Do not drive or use heavy machinery while taking prescription pain medicine. Lifestyle   Do not use any products that contain nicotine or tobacco, such as cigarettes and e-cigarettes. Smoking keeps blood from reaching damaged nerves. If you need help quitting, ask your health care provider.  Avoid or limit alcohol. Too much alcohol can cause a vitamin B deficiency, and vitamin B is needed for healthy nerves.  Eat a healthy diet. This includes: ? Eating foods that are high in fiber, such as fresh fruits and vegetables, whole grains, and beans. ? Limiting foods that are high in fat and processed sugars, such as fried or sweet foods. General instructions   If you have diabetes, work closely with your health care provider to keep your blood sugar under control.  If you have numbness in your feet: ? Check every day for signs of injury or infection. Watch for redness, warmth, and swelling. ? Wear padded socks and comfortable shoes. These help protect your feet.  Develop a good support system. Living with peripheral neuropathy can be stressful. Consider talking with a mental health specialist or joining a support group.  Use assistive devices and attend physical therapy as told by your health care provider. This may include using a walker or a cane.  Keep all follow-up visits as  told by your health care provider. This is important. Contact a health care provider if:  You have new signs or symptoms of peripheral neuropathy.  You are struggling emotionally from dealing with peripheral neuropathy.  Your pain is not well-controlled. Get help right away if:  You have an injury or infection that is not healing normally.  You develop new weakness in an arm or leg.  You fall frequently. Summary  Peripheral neuropathy is when the nerves in the arms, or legs are damaged, resulting in numbness, weakness, or pain.  There are many causes of peripheral neuropathy, including diabetes, pinched nerves, vitamin deficiencies, autoimmune disease, and hereditary conditions.  Diagnosing and finding the cause of peripheral neuropathy can be difficult. Your health care provider will take your medical history, do a physical exam, and do tests, including blood tests and nerve function tests.  Treatment involves treating the underlying cause of the neuropathy and taking medicines to help control pain. Physical therapy and assistive devices may also help. This information is not intended to replace advice given to you by your health care provider. Make sure you discuss any questions you have with your health care provider. Document Revised: 06/01/2017 Document Reviewed:   08/28/2016 Elsevier Patient Education  2020 Elsevier Inc.  

## 2019-09-11 NOTE — Progress Notes (Signed)
Subjective: Stephen Flynn presents today for follow up of preventative diabetic foot care and painful mycotic nails b/l that are difficult to trim. Pain interferes with ambulation. Aggravating factors include wearing enclosed shoe gear. Pain is relieved with periodic professional debridement.   Allergies  Allergen Reactions  . Penicillins Hives    Did it involve swelling of the face/tongue/throat, SOB, or low BP? No Did it involve sudden or severe rash/hives, skin peeling, or any reaction on the inside of your mouth or nose? yes Did you need to seek medical attention at a hospital or doctor's office? yes When did it last happen?child If all above answers are "NO", may proceed with cephalosporin use.     Objective: Vitals:   09/05/19 1540  Temp: (!) 97.3 F (36.3 C)    Pt 86 y.o. year old male  in NAD. AAO x 3.   Vascular Examination:  Capillary refill time to digits immediate b/l. Palpable DP pulses b/l. Palpable PT pulses b/l. Pedal hair absent b/l Skin temperature gradient within normal limits b/l.  Dermatological Examination: Pedal skin with normal turgor, texture and tone bilaterally. No open wounds bilaterally. No interdigital macerations bilaterally. Toenails 1-5 b/l elongated, dystrophic, thickened, crumbly with subungual debris and tenderness to dorsal palpation.  Musculoskeletal: Normal muscle strength 5/5 to all lower extremity muscle groups bilaterally, no gross bony deformities bilaterally and no pain crepitus or joint limitation noted with ROM b/l  Neurological: Protective sensation diminished with 10g monofilament b/l.  Assessment: 1. Pain due to onychomycosis of toenails of both feet   2. Ingrown toenail without infection   3. Neuropathy    Plan: -Toenails 1-5 b/l were debrided in length and girth with sterile nail nippers and dremel without iatrogenic bleeding.  -Patient to continue soft, supportive shoe gear daily. -Patient to report any pedal injuries  to medical professional immediately. -Offending nail border debrided and curretaged b/l great toes. Border(s) cleansed with alcohol and triple antiobiotic ointment applied to digits applied. Patient instructed to apply Neosporin Cream to b/l great toes once daily for 7 days. -Dispensed written instructions for once daily epsom salt soaks for 7 days for ingrown toenail of b/l great toes.  -Patient/POA to call should there be question/concern in the interim.  Return in about 3 months (around 12/06/2019) for nail trim.

## 2019-10-02 ENCOUNTER — Ambulatory Visit: Payer: Self-pay

## 2019-10-06 ENCOUNTER — Other Ambulatory Visit: Payer: Self-pay

## 2019-10-06 NOTE — Patient Outreach (Signed)
[facility]) Care Management  10/06/2019  Kevins Jafri [birth year] [id number]   Telephone Assessment   Outreach attempt #1 to patient. Spoke with patient. He reports that he has been doing fairly well. He states that he went to see podiatrist last month and podiatrist accidentally nicked the side of his foot. He reports that he was told to soak it in Epsom salt which he has done. He states that area has still bothered him some. Patient report that he is now with [facility] services and RN coming today to evaluate him and will look at the area. He reports that area is not open and no wound but just sore to touch. He denies any other issues or concerns at present. Advised patient that RN CM would close case due to him being active with [facility] CM program. He voiced understanding.      Plan: RN CM will close case at this time. RN CM will send MD closure letter.   Enzo Montgomery, RN,BSN,CCM [place] Management Telephonic Care Management Coordinator Direct Phone: [phone] Toll Free: [phone] Fax: [phone]

## 2019-10-08 ENCOUNTER — Ambulatory Visit: Payer: Self-pay

## 2019-12-05 DIAGNOSIS — C61 Malignant neoplasm of prostate: Secondary | ICD-10-CM | POA: Diagnosis not present

## 2019-12-09 ENCOUNTER — Ambulatory Visit: Payer: Medicare PPO | Admitting: Podiatry

## 2019-12-09 ENCOUNTER — Encounter: Payer: Self-pay | Admitting: Podiatry

## 2019-12-09 ENCOUNTER — Other Ambulatory Visit: Payer: Self-pay

## 2019-12-09 DIAGNOSIS — L03031 Cellulitis of right toe: Secondary | ICD-10-CM | POA: Diagnosis not present

## 2019-12-09 DIAGNOSIS — M79675 Pain in left toe(s): Secondary | ICD-10-CM

## 2019-12-09 DIAGNOSIS — M79674 Pain in right toe(s): Secondary | ICD-10-CM | POA: Diagnosis not present

## 2019-12-09 DIAGNOSIS — B351 Tinea unguium: Secondary | ICD-10-CM

## 2019-12-09 DIAGNOSIS — L600 Ingrowing nail: Secondary | ICD-10-CM

## 2019-12-09 MED ORDER — DOXYCYCLINE HYCLATE 100 MG PO TABS: 100.0000 mg | ORAL_TABLET | Freq: Two times a day (BID) | ORAL | 0 refills | Status: DC

## 2019-12-09 NOTE — Progress Notes (Signed)
This patient returns to my office for at risk foot care.  This patient requires this care by a professional since this patient will be at risk due to having  diabetes and neuropathy. He says he was seen 3 months ago and the doctor made him bleed.  He says he soaked this right big toe for 2 weeks and the toe never got better.  He says that anything touching the tip of medial border right big toe causes pain.  He denies any drainage from his nail.   This patient is unable to cut nails himself since the patient cannot reach his nails.These nails are painful walking and wearing shoes.  This patient presents for at risk foot care today.  General Appearance  Alert, conversant and in no acute stress.  Vascular  Dorsalis pedis and posterior tibial  pulses are palpable  bilaterally.  Capillary return is within normal limits  bilaterally. Temperature is within normal limits  bilaterally.  Neurologic  Senn-Weinstein monofilament wire test diminished   bilaterally. Muscle power within normal limits bilaterally.  Nails Thick disfigured discolored nails with subungual debris  from hallux to fifth toes bilaterally. No evidence of bacterial infection or drainage bilaterally. Inflamed painful distal aspect medial border right hallux.  Dry granulation noted at distal aspect medial border right hallux.  Desquamation noted at site of painful toe.  Orthopedic  No limitations of motion  feet .  No crepitus or effusions noted.  No bony pathology or digital deformities noted.  Skin  normotropic skin with no porokeratosis noted bilaterally.  No signs of infections or ulcers noted.     Onychomycosis  Pain in right toes  Pain in left toes  Consent was obtained for treatment procedures.   Mechanical debridement of nails 1-5  bilaterally performed with a nail nipper.  Filed with dremel without incident.  Discussed infection with patient and we decided to prescribe doxycycline for 10 days.  Reschedule if problem persists and we  will consider incision and drainage right hallux under anesthesia.   Return office visit   10 weeks.                  Told patient to return for periodic foot care and evaluation due to potential at risk complications.   Gardiner Barefoot DPM

## 2019-12-26 DIAGNOSIS — C61 Malignant neoplasm of prostate: Secondary | ICD-10-CM | POA: Diagnosis not present

## 2020-02-10 DIAGNOSIS — I10 Essential (primary) hypertension: Secondary | ICD-10-CM | POA: Diagnosis not present

## 2020-03-10 ENCOUNTER — Encounter: Payer: Self-pay | Admitting: Adult Health

## 2020-03-10 ENCOUNTER — Ambulatory Visit: Payer: Medicare HMO | Admitting: Adult Health

## 2020-03-10 ENCOUNTER — Other Ambulatory Visit: Payer: Self-pay

## 2020-03-10 VITALS — BP 140/88 | HR 71 | Ht 68.0 in | Wt 247.6 lb

## 2020-03-10 DIAGNOSIS — I639 Cerebral infarction, unspecified: Secondary | ICD-10-CM | POA: Diagnosis not present

## 2020-03-10 DIAGNOSIS — E785 Hyperlipidemia, unspecified: Secondary | ICD-10-CM

## 2020-03-10 DIAGNOSIS — I10 Essential (primary) hypertension: Secondary | ICD-10-CM | POA: Diagnosis not present

## 2020-03-10 DIAGNOSIS — I69941 Monoplegia of lower limb following unspecified cerebrovascular disease affecting right dominant side: Secondary | ICD-10-CM | POA: Diagnosis not present

## 2020-03-10 NOTE — Patient Instructions (Signed)
Continue aspirin 325 mg daily  and lipitor 80mg  daily  for secondary stroke prevention  Continue to follow up with PCP regarding cholesterol and blood pressure management  Maintain strict control of hypertension with blood pressure goal below 130/90 and cholesterol with LDL cholesterol (bad cholesterol) goal below 70 mg/dL.       Thank you for coming to see [place] at [facility]. I hope we have been able to provide you high quality care today.  You may receive a patient satisfaction survey over the next few weeks. We would appreciate your feedback and comments so that we may continue to improve ourselves and the health of our patients.

## 2020-03-10 NOTE — Progress Notes (Signed)
[facility] [address] [place]. [place] [address] [phone]       OFFICE FOLLOW UP NOTE  [name] Date of Birth:  17-Dec-1927 Medical Record Number:  [id number]   Referring MD: Rosalin Hawking Reason for Referral: Stroke  Chief Complaint  Patient presents with  . Follow-up    6 month f/u. States he has been doing well since last visit.   [name] Kitchen room 4    with wife     HPI:  Today, 03/10/2020, Stephen Flynn is being seen for stroke follow-up accompanied by his wife  Reports residual deficits of mild right leg weakness which has been stable without worsening Use of cane at all times - denies any recent falls Denies new or worsening stroke/TIA symptoms  Continues on aspirin 325 mg daily without bleeding or bruising Continues on atorvastatin 80 mg daily without myalgias Blood pressure today 140/88 - blood pressure monitored at home which has been stable  No concerns at this time     History provided for reference purposes only  Update 09/03/2019 JM: Stephen Flynn is a 86 year old male who is being seen today, 09/03/2019, for stroke follow-up.  He has been doing well since prior visit with residual right leg weakness with mild improvement.  He does report adequate exercise throughout the day but does not routinely do recommended exercises by therapy.  Continues to ambulate with a cane and denies any recent falls.  He does report occasional tightness sensation right anterior thigh.  Continues on aspirin and atorvastatin for secondary stroke prevention without side effects.  Blood pressure today 138/82.  No further concerns at this time.  Stephen Flynn is a 86 year old African-American male seen today for initial office consultation visit for stroke.  He is accompanied by his wife.  History is obtained from them, review of electronic medical records and I personally reviewed imaging films in PACS.  He has past medical history of hypertension, hyperlipidemia and obesity who  presented on 03/21/2019 to [facility] with 2-day history of right-sided weakness.  He noticed that while walking his right leg would give out and he fell.  He also had mild dysarthria and trouble speaking and some numbness in the right leg as well.  CT scan of the head on admission showed no acute abnormality but MRI scan showed punctate acute to early subacute infarcts in the left corona radiata and left insula.  There were also multiple chronic white matter infarcts in the left centrum semiovale, corona radiata and deep watershed area.  CT angiogram showed occlusion of the left middle cerebral artery in the proximal left M1 segment with moderate collateralization in the distal territories.  2D echo showed normal ejection fraction.  LDL cholesterol was 65 mg percent and hemoglobin A1c was 7.4.  Patient was started on aspirin and Plavix for 3 months due to intracranial stenosis/occlusion.  He went home and is finished home physical occupational therapy.  He is now ambulating with a cane.  He still feels his right leg drags at times and he cannot trust it.  Is however had no falls since he has been using a cane now to walk regularly.  Is tolerating aspirin and Plavix well without bruising or bleeding.  His blood pressure is well controlled at home though today it is elevated at 160/88 in office.  Is not been checking his fasting sugars regularly with hemoglobin A1c in the hospital was slightly high at 7.4.  He has been compliant with his medications.  He has a family history of stroke in his dad.  Is tolerating statin well without muscle aches and pains.  He has no new complaints.  He states he has been eating healthy diet but has not been able to walk a lot due to his leg weakness.    ROS:   14 system review of systems is positive for those listed in HPI and all other systems negative  PMH:  Past Medical History:  Diagnosis Date  . Anemia   . Aortic insufficiency   . Chest pain   . H/O: GI bleed    . Hyperlipidemia   . Hypertension   . Leg cramps   . Stroke [facility])     Social History:  Social History   Socioeconomic History  . Marital status: Married    Spouse name: Not on file  . Number of children: Not on file  . Years of education: Not on file  . Highest education level: Not on file  Occupational History  . Not on file  Tobacco Use  . Smoking status: Former Research scientist (life sciences)  . Smokeless tobacco: Never Used  Substance and Sexual Activity  . Alcohol use: No  . Drug use: No  . Sexual activity: Not on file  Other Topics Concern  . Not on file  Social History Narrative  . Not on file   Social Determinants of Health   Financial Resource Strain:   . Difficulty of Paying Living Expenses: Not on file  Food Insecurity:   . Worried About Charity fundraiser in the Last Year: Not on file  . Ran Out of Food in the Last Year: Not on file  Transportation Needs: No Transportation Needs  . Lack of Transportation (Medical): No  . Lack of Transportation (Non-Medical): No  Physical Activity:   . Days of Exercise per Week: Not on file  . Minutes of Exercise per Session: Not on file  Stress:   . Feeling of Stress : Not on file  Social Connections:   . Frequency of Communication with Friends and Family: Not on file  . Frequency of Social Gatherings with Friends and Family: Not on file  . Attends Religious Services: Not on file  . Active Member of Clubs or Organizations: Not on file  . Attends Archivist Meetings: Not on file  . Marital Status: Not on file  Intimate Partner Violence:   . Fear of Current or Ex-Partner: Not on file  . Emotionally Abused: Not on file  . Physically Abused: Not on file  . Sexually Abused: Not on file    Medications:   Current Outpatient Medications on File Prior to Visit  Medication Sig Dispense Refill  . amLODipine (NORVASC) 5 MG tablet Take 5 mg by mouth daily.    [name] Kitchen aspirin 325 MG tablet Take 325 mg by mouth daily.    . hydrALAZINE  (APRESOLINE) 25 MG tablet Take 25 mg by mouth 2 (two) times daily.    [name] Kitchen ibuprofen (ADVIL) 200 MG tablet Take 400 mg by mouth every 6 (six) hours as needed for fever, headache or moderate pain.    . metFORMIN (GLUCOPHAGE) 500 MG tablet Take 1 tablet (500 mg total) by mouth 2 (two) times daily with a meal. 60 tablet 11  . Polyethyl Glycol-Propyl Glycol (SYSTANE FREE OP) Place 1 drop into both eyes daily as needed (dry eyes).     . tamsulosin (FLOMAX) 0.4 MG CAPS capsule Take 0.4 mg by mouth.    [name] Kitchen  atorvastatin (LIPITOR) 80 MG tablet Take 1 tablet (80 mg total) by mouth daily at 6 PM. (Patient taking differently: Take 80 mg by mouth daily. ) 30 tablet 2   No current facility-administered medications on file prior to visit.    Allergies:   Allergies  Allergen Reactions  . Penicillins Hives    Did it involve swelling of the face/tongue/throat, SOB, or low BP? No Did it involve sudden or severe rash/hives, skin peeling, or any reaction on the inside of your mouth or nose? yes Did you need to seek medical attention at a hospital or doctor's office? yes When did it last happen?child If all above answers are "NO", may proceed with cephalosporin use.    Physical Exam  Today's Vitals   03/10/20 1456  Weight: 247 lb 9.6 oz (112.3 kg)  Height: 5\' 8"  (1.727 m)   Body mass index is 37.65 kg/m.   General: Obese pleasant elderly African-American male, seated, in no evident distress Head: head normocephalic and atraumatic.   Neck: supple with no carotid or supraclavicular bruits Cardiovascular: regular rate and rhythm, no murmurs Musculoskeletal: no deformity Skin:  no rash/petichiae Vascular:  Normal pulses all extremities  Neurologic Exam Mental Status: Awake and fully alert.  Normal speech and language.  Oriented to place and time. Recent and remote memory intact. Attention span, concentration and fund of knowledge appropriate. Mood and affect appropriate.  Cranial Nerves: Pupils  equal, briskly reactive to light. Extraocular movements full without nystagmus. Visual fields full to confrontation. Hearing intact. Facial sensation intact.  Mild right lower facial asymmetry when he smiles.  Tongue, palate moves normally and symmetrically.  Motor: Normal bulk and tone. Normal strength in all tested extremity muscles except mildly decreased right hip flexor and right ankle dorsiflexion  Sensory.: intact to touch , pinprick , position and vibratory sensation.  Coordination: Rapid alternating movements normal in all extremities. Finger-to-nose and heel-to-shin performed accurately bilaterally. Gait and Station: Arises from chair with mild  difficulty. Stance is normal. Gait demonstrates favoring of the right leg with some dragging and use of cane.  Unable to heel, toe and tandem walk  .  Reflexes: 1+ and symmetric. Toes downgoing.      ASSESSMENT: 86 year old African-American gentleman with left brain subcortical lacunar infarct in September 2020 from small vessel disease.  Vascular risk factors of diabetes, hypertension, hyperlipidemia, intracranial atherosclerosis, age and mild obesity.  Residual deficit of mild right leg weakness    PLAN:  -Continue aspirin 81 mg daily atorvastatin for secondary stroke prevention -Continue to follow with PCP for HTN, HLD and DM management - maintain strict control of hypertension with blood pressure goal below 130/90, diabetes with hemoglobin A1c goal below 6.5% and lipids with LDL cholesterol goal below 70 mg/dL. I also advised the patient to eat a healthy diet with plenty of whole grains, cereals, fruits and vegetables, exercise regularly and maintain ideal body weight  Overall stable from stroke standpoint and recommend follow-up on an as-needed basis  I spent 20 minutes of face-to-face and non-face-to-face time with patient and wife.  This included previsit chart review, lab review, study review, order entry, electronic health record  documentation, patient education regarding history of stroke with residual deficits, importance of managing stroke risk factors and answered all questions to patient and wife satisfaction   Frann Rider, AGNP-BC  [facility] [address] [place], [place] [address]  Phone [phone] Fax [phone] Note: This document was prepared with digital dictation and possible smart  Company secretary. Any transcriptional errors that result from this process are unintentional.

## 2020-03-11 NOTE — Progress Notes (Signed)
I agree with the above plan 

## 2020-03-19 ENCOUNTER — Encounter: Payer: Self-pay | Admitting: Podiatry

## 2020-03-19 ENCOUNTER — Other Ambulatory Visit: Payer: Self-pay

## 2020-03-19 ENCOUNTER — Ambulatory Visit (INDEPENDENT_AMBULATORY_CARE_PROVIDER_SITE_OTHER): Payer: Medicare HMO | Admitting: Podiatry

## 2020-03-19 DIAGNOSIS — B351 Tinea unguium: Secondary | ICD-10-CM | POA: Diagnosis not present

## 2020-03-19 DIAGNOSIS — M79675 Pain in left toe(s): Secondary | ICD-10-CM

## 2020-03-19 DIAGNOSIS — M79674 Pain in right toe(s): Secondary | ICD-10-CM

## 2020-03-19 DIAGNOSIS — E1142 Type 2 diabetes mellitus with diabetic polyneuropathy: Secondary | ICD-10-CM

## 2020-03-19 DIAGNOSIS — L600 Ingrowing nail: Secondary | ICD-10-CM

## 2020-03-21 NOTE — Progress Notes (Addendum)
Subjective:  Patient ID: Stephen Flynn, male    DOB: Sep 11, 1927,  MRN: [id number]  86 y.o. male presents with at risk foot care with history of diabetic neuropathy and painful thick toenails that are difficult to trim. Pain interferes with ambulation. Aggravating factors include wearing enclosed shoe gear. Pain is relieved with periodic professional debridement.   He relates his right great toe is still sore and asks that I not "dig" with currette today.  Review of Systems: Negative except as noted in the HPI.  Past Medical History:  Diagnosis Date  . Anemia   . Aortic insufficiency   . Chest pain   . H/O: GI bleed   . Hyperlipidemia   . Hypertension   . Leg cramps   . Stroke [facility])    Past Surgical History:  Procedure Laterality Date  . CARDIOVASCULAR STRESS TEST  09/04/2005  . US ECHOCARDIOGRAPHY  11/26/2007   ef 55-60%   Patient Active Problem List   Diagnosis Date Noted  . Paronychia of great toe, right 12/09/2019  . Pain due to onychomycosis of toenails of both feet 05/28/2019  . Neuropathy 05/28/2019  . Ischemic stroke ([place]) 03/21/2019  . Essential hypertension 03/21/2019  . BPH (benign prostatic hyperplasia) 03/21/2019  . UNSPECIFIED ANEMIA 07/25/2007  . G E R D 07/25/2007  . HIATAL HERNIA 07/25/2007  . HEMORRHOIDS, INTERNAL 05/28/2007  . UNS GASTRITIS&GASTRODUODITIS W/O MENTION HEMORR 05/28/2007  . DUODENITIS WITHOUT MENTION OF HEMORRHAGE 05/28/2007    Current Outpatient Medications:  .  amLODipine (NORVASC) 5 MG tablet, Take 5 mg by mouth daily., Disp: , Rfl:  .  aspirin 325 MG tablet, Take 325 mg by mouth daily., Disp: , Rfl:  .  atorvastatin (LIPITOR) 80 MG tablet, Take 1 tablet (80 mg total) by mouth daily at 6 PM. (Patient taking differently: Take 80 mg by mouth daily. ), Disp: 30 tablet, Rfl: 2 .  hydrALAZINE (APRESOLINE) 25 MG tablet, Take 25 mg by mouth 2 (two) times daily., Disp: , Rfl:  .  ibuprofen (ADVIL) 200 MG tablet, Take 400 mg by mouth every 6 (six)  hours as needed for fever, headache or moderate pain., Disp: , Rfl:  .  metFORMIN (GLUCOPHAGE) 500 MG tablet, Take 1 tablet (500 mg total) by mouth 2 (two) times daily with a meal., Disp: 60 tablet, Rfl: 11 .  Polyethyl Glycol-Propyl Glycol (SYSTANE FREE OP), Place 1 drop into both eyes daily as needed (dry eyes). , Disp: , Rfl:  .  tamsulosin (FLOMAX) 0.4 MG CAPS capsule, Take 0.4 mg by mouth., Disp: , Rfl:  Allergies  Allergen Reactions  . Penicillins Hives    Did it involve swelling of the face/tongue/throat, SOB, or low BP? No Did it involve sudden or severe rash/hives, skin peeling, or any reaction on the inside of your mouth or nose? yes Did you need to seek medical attention at a hospital or doctor's office? yes When did it last happen?child If all above answers are "NO", may proceed with cephalosporin use.   Social History   Tobacco Use  Smoking Status Former Smoker  Smokeless Tobacco Never Used   Objective:  There were no vitals filed for this visit. Constitutional Patient is a pleasant 86 y.o. African American male obese in NAD.[name] Kitchen AAO x 3.  Vascular Capillary refill time to digits immediate b/l. Palpable pedal pulses b/l LE. Pedal hair absent. Lower extremity skin temperature gradient within normal limits. No ischemia or gangrene noted b/l lower extremities. No cyanosis or clubbing noted.  Neurologic Normal speech. Oriented to person, place, and time. Protective sensation diminished with 10g monofilament b/l. Proprioception intact bilaterally.  Dermatologic Pedal skin with normal turgor, texture and tone bilaterally. No open wounds bilaterally. No interdigital macerations bilaterally. Toenails 1-5 b/l elongated, discolored, dystrophic, thickened, crumbly with subungual debris and tenderness to dorsal palpation. Incurvated nailplate medial border border(s) R hallux.  Nail border hypertrophy absent. There is tenderness to palpation.He does have dried serosanguinous drainage  subungually on the medial border. There is no purulence expressed with palpation or inspection with currette.  Orthopedic: Normal muscle strength 5/5 to all lower extremity muscle groups bilaterally. No pain crepitus or joint limitation noted with ROM b/l. No gross bony deformities bilaterally. Wearing appropriate fitting shoe gear as open toed sandals on today's visit. Utilizes cane for ambulation assistance.   Hemoglobin A1C Latest Ref Rng & Units 03/22/2019  HGBA1C 4.8 - 5.6 % 7.4(H)  Some recent data might be hidden   Assessment:   1. Pain due to onychomycosis of toenails of both feet   2. Ingrown toenail without infection   3. Diabetic peripheral neuropathy associated with type 2 diabetes mellitus ([place])    Plan:  Patient was evaluated and treated and all questions answered.  Onychomycosis with pain -Nails palliatively debridement as below. -Educated on self-care  Procedure: Nail Debridement Rationale: Pain Type of Debridement: manual, sharp debridement. Instrumentation: Nail nipper, rotary burr. Number of Nails: 10  -Examined patient. -Continue diabetic foot care principles. -Toenails 1-5 b/l were debrided in length and girth with sterile nail nippers and dremel without iatrogenic bleeding.  -Offending nail border debrided and gently curretaged R hallux utilizing sterile nail nipper and currette. Border cleansed with alcohol and triple antibiotic applied. No further treatment required by patient/caregiver. Wife instructed to apply Neosporin with Pain Relief to right hallux once daily for once week. Call office if he has any problems. -Patient to report any pedal injuries to medical professional immediately. -Patient to continue soft, supportive shoe gear daily. -Patient/POA to call should there be question/concern in the interim.  Return in about 3 months (around 06/18/2020).  Marzetta Board, DPM

## 2020-06-11 DIAGNOSIS — M25551 Pain in right hip: Secondary | ICD-10-CM | POA: Diagnosis not present

## 2020-06-11 DIAGNOSIS — Z9989 Dependence on other enabling machines and devices: Secondary | ICD-10-CM | POA: Diagnosis not present

## 2020-06-11 DIAGNOSIS — I69398 Other sequelae of cerebral infarction: Secondary | ICD-10-CM | POA: Diagnosis not present

## 2020-06-11 DIAGNOSIS — Z7982 Long term (current) use of aspirin: Secondary | ICD-10-CM | POA: Diagnosis not present

## 2020-06-11 DIAGNOSIS — I69341 Monoplegia of lower limb following cerebral infarction affecting right dominant side: Secondary | ICD-10-CM | POA: Diagnosis not present

## 2020-06-11 DIAGNOSIS — Z6835 Body mass index (BMI) 35.0-35.9, adult: Secondary | ICD-10-CM | POA: Diagnosis not present

## 2020-06-11 DIAGNOSIS — R296 Repeated falls: Secondary | ICD-10-CM | POA: Diagnosis not present

## 2020-06-14 ENCOUNTER — Other Ambulatory Visit: Payer: Self-pay

## 2020-06-14 ENCOUNTER — Ambulatory Visit: Payer: Medicare HMO | Admitting: Podiatry

## 2020-06-14 DIAGNOSIS — B351 Tinea unguium: Secondary | ICD-10-CM | POA: Diagnosis not present

## 2020-06-14 DIAGNOSIS — E1142 Type 2 diabetes mellitus with diabetic polyneuropathy: Secondary | ICD-10-CM

## 2020-06-14 DIAGNOSIS — M79675 Pain in left toe(s): Secondary | ICD-10-CM | POA: Diagnosis not present

## 2020-06-14 DIAGNOSIS — M79674 Pain in right toe(s): Secondary | ICD-10-CM

## 2020-06-20 ENCOUNTER — Encounter: Payer: Self-pay | Admitting: Podiatry

## 2020-06-20 NOTE — Progress Notes (Signed)
Subjective:  Patient ID: Stephen Flynn, male    DOB: September 07, 1927,  MRN: [id number]  86 y.o. male presents with at risk foot care with history of diabetic neuropathy and painful thick toenails that are difficult to trim. Pain interferes with ambulation. Aggravating factors include wearing enclosed shoe gear. Pain is relieved with periodic professional debridement.   He relates his right great toe feels a little better today. His wife is present on today's visit. He voices no new pedal problems on today's visit.  Review of Systems: Negative except as noted in the HPI.  Past Medical History:  Diagnosis Date  . Anemia   . Aortic insufficiency   . Chest pain   . H/O: GI bleed   . Hyperlipidemia   . Hypertension   . Leg cramps   . Stroke [facility])    Past Surgical History:  Procedure Laterality Date  . CARDIOVASCULAR STRESS TEST  09/04/2005  . US ECHOCARDIOGRAPHY  11/26/2007   ef 55-60%   Patient Active Problem List   Diagnosis Date Noted  . Paronychia of great toe, right 12/09/2019  . Pain due to onychomycosis of toenails of both feet 05/28/2019  . Neuropathy 05/28/2019  . Ischemic stroke ([place]) 03/21/2019  . Essential hypertension 03/21/2019  . BPH (benign prostatic hyperplasia) 03/21/2019  . UNSPECIFIED ANEMIA 07/25/2007  . G E R D 07/25/2007  . HIATAL HERNIA 07/25/2007  . HEMORRHOIDS, INTERNAL 05/28/2007  . UNS GASTRITIS&GASTRODUODITIS W/O MENTION HEMORR 05/28/2007  . DUODENITIS WITHOUT MENTION OF HEMORRHAGE 05/28/2007    Current Outpatient Medications:  .  amLODipine (NORVASC) 5 MG tablet, Take 5 mg by mouth daily., Disp: , Rfl:  .  aspirin 325 MG tablet, Take 325 mg by mouth daily., Disp: , Rfl:  .  atorvastatin (LIPITOR) 80 MG tablet, Take 1 tablet (80 mg total) by mouth daily at 6 PM. (Patient taking differently: Take 80 mg by mouth daily. ), Disp: 30 tablet, Rfl: 2 .  hydrALAZINE (APRESOLINE) 25 MG tablet, Take 25 mg by mouth 2 (two) times daily., Disp: , Rfl:  .  ibuprofen  (ADVIL) 200 MG tablet, Take 400 mg by mouth every 6 (six) hours as needed for fever, headache or moderate pain., Disp: , Rfl:  .  metFORMIN (GLUCOPHAGE) 500 MG tablet, Take 1 tablet (500 mg total) by mouth 2 (two) times daily with a meal., Disp: 60 tablet, Rfl: 11 .  Polyethyl Glycol-Propyl Glycol (SYSTANE FREE OP), Place 1 drop into both eyes daily as needed (dry eyes). , Disp: , Rfl:  .  tamsulosin (FLOMAX) 0.4 MG CAPS capsule, Take 0.4 mg by mouth., Disp: , Rfl:  Allergies  Allergen Reactions  . Penicillins Hives    Did it involve swelling of the face/tongue/throat, SOB, or low BP? No Did it involve sudden or severe rash/hives, skin peeling, or any reaction on the inside of your mouth or nose? yes Did you need to seek medical attention at a hospital or doctor's office? yes When did it last happen?child If all above answers are "NO", may proceed with cephalosporin use.   Social History   Tobacco Use  Smoking Status Former Smoker  Smokeless Tobacco Never Used   Objective:  There were no vitals filed for this visit. Constitutional Patient is a pleasant 86 y.o. African American male obese in NAD.[name] Kitchen AAO x 3.  Vascular Capillary refill time to digits immediate b/l. Palpable pedal pulses b/l LE. Pedal hair absent. Lower extremity skin temperature gradient within normal limits. No ischemia or gangrene noted  b/l lower extremities. No cyanosis or clubbing noted.  Neurologic Normal speech. Oriented to person, place, and time. Protective sensation diminished with 10g monofilament b/l. Proprioception intact bilaterally.  Dermatologic Pedal skin with normal turgor, texture and tone bilaterally. No open wounds bilaterally. No interdigital macerations bilaterally. Toenails 1-5 b/l elongated, discolored, dystrophic, thickened, crumbly with subungual debris and tenderness to dorsal palpation.  Orthopedic: Normal muscle strength 5/5 to all lower extremity muscle groups bilaterally. No pain crepitus or  joint limitation noted with ROM b/l. No gross bony deformities bilaterally. Wearing appropriate fitting shoe gear as open toed sandals on today's visit. Utilizes cane for ambulation assistance.   No flowsheet data found. Assessment:   1. Pain due to onychomycosis of toenails of both feet   2. Diabetic peripheral neuropathy associated with type 2 diabetes mellitus ([place])    Plan:  Patient was evaluated and treated and all questions answered.  Onychomycosis with pain -Nails palliatively debridement as below. -Educated on self-care  Procedure: Nail Debridement Rationale: Pain Type of Debridement: manual, sharp debridement. Instrumentation: Nail nipper, rotary burr. Number of Nails: 10  -Examined patient. -Continue diabetic foot care principles. -Toenails 1-5 b/l were debrided in length and girth with sterile nail nippers and dremel without iatrogenic bleeding.  -Patient to report any pedal injuries to medical professional immediately. -Patient to continue soft, supportive shoe gear daily. -Patient/POA to call should there be question/concern in the interim.  Return in about 3 months (around 09/12/2020) for diabetic foot care.  Marzetta Board, DPM

## 2020-09-15 ENCOUNTER — Ambulatory Visit (INDEPENDENT_AMBULATORY_CARE_PROVIDER_SITE_OTHER): Payer: Medicare Other | Admitting: Podiatry

## 2020-09-15 ENCOUNTER — Other Ambulatory Visit: Payer: Self-pay

## 2020-09-15 ENCOUNTER — Encounter: Payer: Self-pay | Admitting: Podiatry

## 2020-09-15 DIAGNOSIS — M79674 Pain in right toe(s): Secondary | ICD-10-CM | POA: Diagnosis not present

## 2020-09-15 DIAGNOSIS — I5032 Chronic diastolic (congestive) heart failure: Secondary | ICD-10-CM | POA: Insufficient documentation

## 2020-09-15 DIAGNOSIS — Z8673 Personal history of transient ischemic attack (TIA), and cerebral infarction without residual deficits: Secondary | ICD-10-CM | POA: Insufficient documentation

## 2020-09-15 DIAGNOSIS — M79651 Pain in right thigh: Secondary | ICD-10-CM | POA: Insufficient documentation

## 2020-09-15 DIAGNOSIS — R0609 Other forms of dyspnea: Secondary | ICD-10-CM | POA: Insufficient documentation

## 2020-09-15 DIAGNOSIS — B351 Tinea unguium: Secondary | ICD-10-CM

## 2020-09-15 DIAGNOSIS — M79675 Pain in left toe(s): Secondary | ICD-10-CM

## 2020-09-15 DIAGNOSIS — I872 Venous insufficiency (chronic) (peripheral): Secondary | ICD-10-CM | POA: Insufficient documentation

## 2020-09-23 NOTE — Progress Notes (Signed)
  Subjective:  Patient ID: Stephen Flynn, male    DOB: 1928/05/09,  MRN: [id number]  87 y.o. male presents with painful thick toenails that are difficult to trim. Pain interferes with ambulation. Aggravating factors include wearing enclosed shoe gear. Pain is relieved with periodic professional debridement.   He voices no new pedal concerns on today's visit.  PCP is Dr. [name].   Review of Systems: Negative except as noted in the HPI.   Allergies  Allergen Reactions  . Penicillins Hives    Did it involve swelling of the face/tongue/throat, SOB, or low BP? No Did it involve sudden or severe rash/hives, skin peeling, or any reaction on the inside of your mouth or nose? yes Did you need to seek medical attention at a hospital or doctor's office? yes When did it last happen?child If all above answers are "NO", may proceed with cephalosporin use.   Objective:  There were no vitals filed for this visit. Constitutional Patient is a pleasant 87 y.o. African American male obese in NAD.[name] Kitchen AAO x 3.  Vascular Capillary refill time to digits immediate b/l. Palpable pedal pulses b/l LE. Pedal hair absent. Lower extremity skin temperature gradient within normal limits. No ischemia or gangrene noted b/l lower extremities. No cyanosis or clubbing noted.  Neurologic Normal speech. Oriented to person, place, and time. Protective sensation diminished with 10g monofilament b/l. Proprioception intact bilaterally.  Dermatologic Pedal skin with normal turgor, texture and tone bilaterally. No open wounds bilaterally. No interdigital macerations bilaterally. Toenails 1-5 b/l elongated, discolored, dystrophic, thickened, crumbly with subungual debris and tenderness to dorsal palpation.  Orthopedic: Normal muscle strength 5/5 to all lower extremity muscle groups bilaterally. No pain crepitus or joint limitation noted with ROM b/l. No gross bony deformities bilaterally. Wearing appropriate fitting shoe gear as  open toed sandals on today's visit. Utilizes cane for ambulation assistance.   Assessment:   1. Pain due to onychomycosis of toenails of both feet    Plan:  Patient was evaluated and treated and all questions answered.  Onychomycosis with pain -Nails palliatively debridement as below. -Educated on self-care  Procedure: Nail Debridement Rationale: Pain Type of Debridement: manual, sharp debridement. Instrumentation: Nail nipper, rotary burr. Number of Nails: 10  -Examined patient. -Continue diabetic foot care principles. -Toenails 1-5 b/l were debrided in length and girth with sterile nail nippers and dremel without iatrogenic bleeding.  -Patient to report any pedal injuries to medical professional immediately. -Patient to continue soft, supportive shoe gear daily. -Patient/POA to call should there be question/concern in the interim.  Return in about 3 months (around 12/16/2020).  Marzetta Board, DPM

## 2020-10-14 IMAGING — MR MR HEAD W/O CM
12 of 13 series · 44 of 48 positions shown · non-contrast
Comparison: CT angiogram head/neck 03/21/2019, brain MRI 03/21/2019

CLINICAL DATA: Focal neuro deficit, greater than 6 hours, stroke
suspected.

EXAM:
MRI HEAD WITHOUT CONTRAST
TECHNIQUE: Multiplanar, multiecho pulse sequences of the brain and surrounding
structures were obtained without intravenous contrast.

[Series 5: DWI · axial · 3.0mm · 0.88mm/px · z∈[-112,+36]mm · 8 of 104 slices shown (1 of 4)]
[im 1/104]
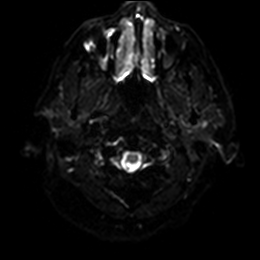
[im 15/104]
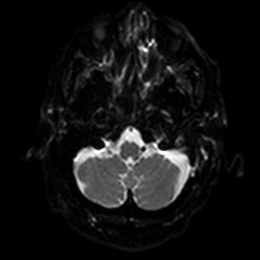
[im 30/104]
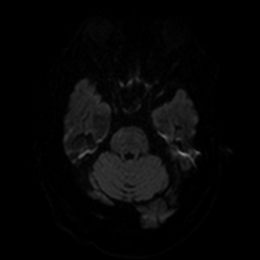
[im 45/104]
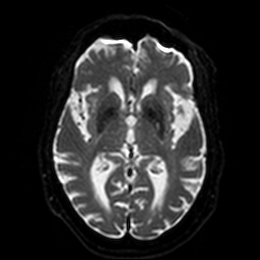
[im 59/104]
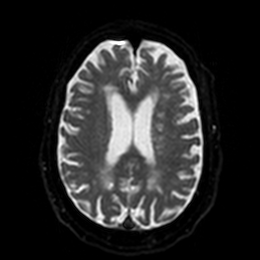
[im 74/104]
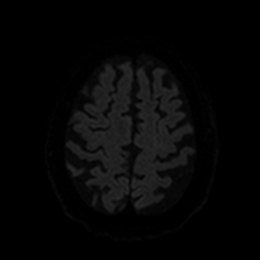
[im 89/104]
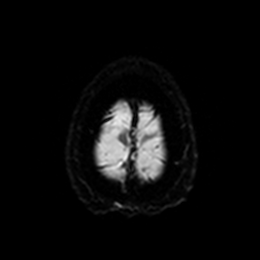
[im 104/104]
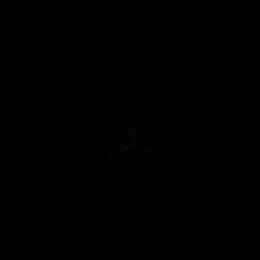

[Series 6: DWI · axial · 3.0mm · 0.88mm/px · z∈[-112,+36]mm · 4 of 52 slices shown (2 of 4)]
[im 1/52]
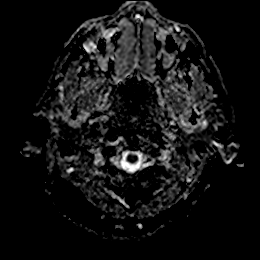
[im 18/52]
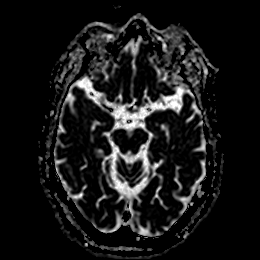
[im 35/52]
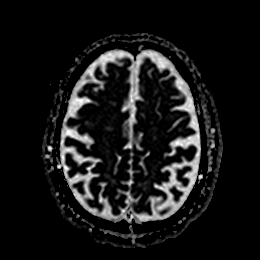
[im 52/52]
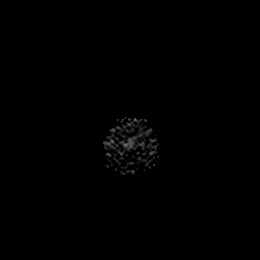

[Series 7: DWI · coronal · 4.0mm · 0.88mm/px · 5 of 72 slices shown (3 of 4)]
[im 1/72]
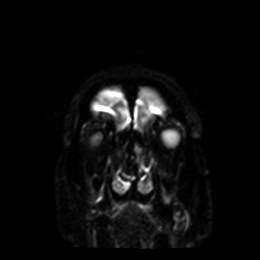
[im 18/72]
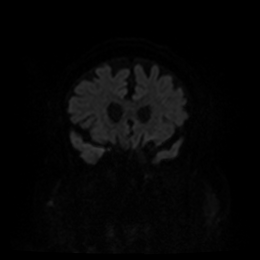
[im 36/72]
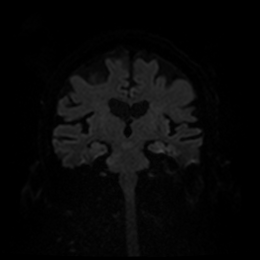
[im 54/72]
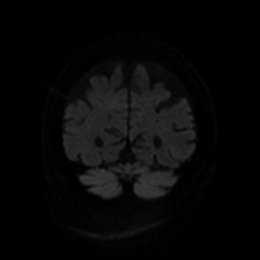
[im 72/72]
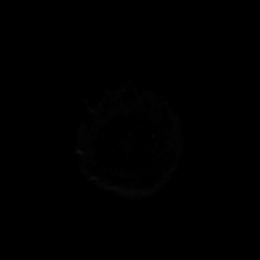

[Series 8: DWI · coronal · 4.0mm · 0.88mm/px · 3 of 36 slices shown (4 of 4)]
[im 1/36]
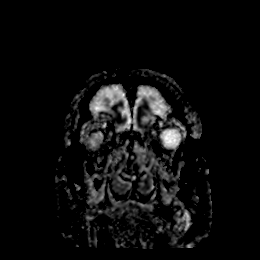
[im 18/36]
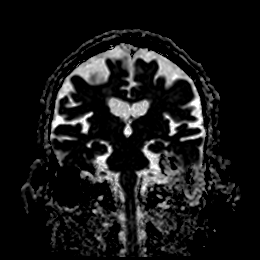
[im 36/36]
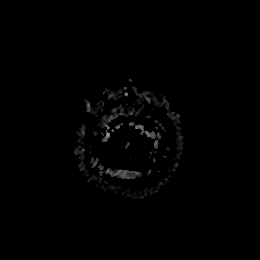

[Series 9: FLAIR · axial · 5.0mm · 0.45mm/px · z∈[-110,+30]mm · 2 of 25 slices shown]
[im 1/25]
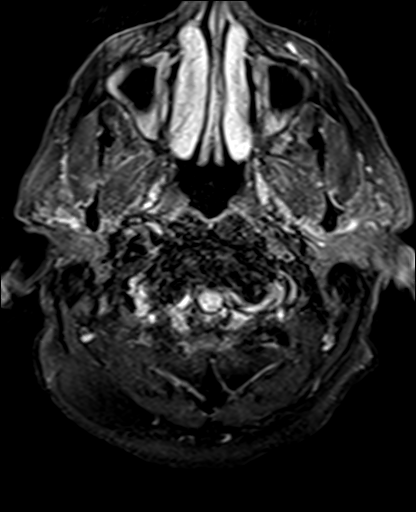
[im 25/25]
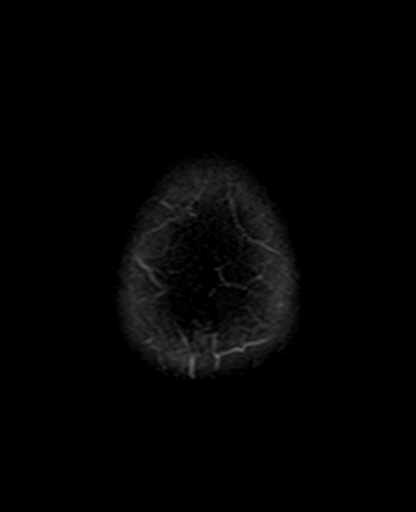

[Series 10: mag_images · axial · 3.0mm · 0.90mm/px · z∈[-120,+41]mm · 4 of 56 slices shown]
[im 1/56]
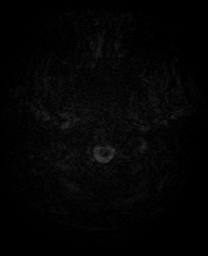
[im 19/56]
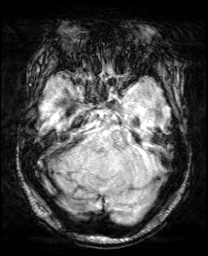
[im 37/56]
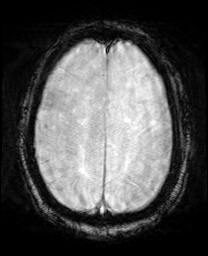
[im 56/56]
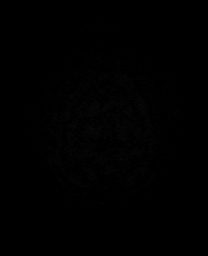

[Series 11: pha_images · axial · 3.0mm · 0.90mm/px · z∈[-117,+41]mm · 4 of 54 slices shown]
[im 1/54]
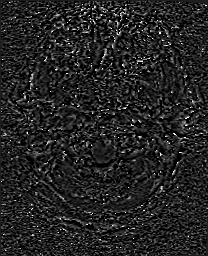
[im 18/54]
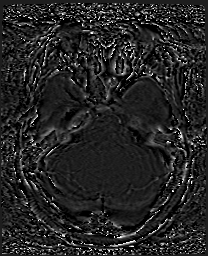
[im 36/54]
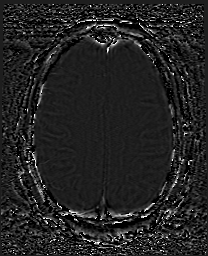
[im 54/54]
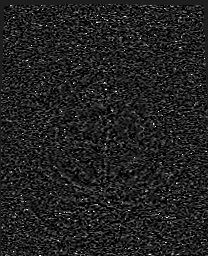

[Series 12: swi_images · axial · 3.0mm · 0.90mm/px · z∈[-120,+41]mm · 4 of 56 slices shown]
[im 1/56]
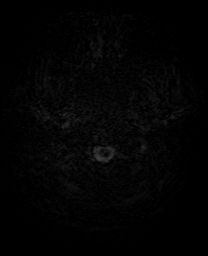
[im 19/56]
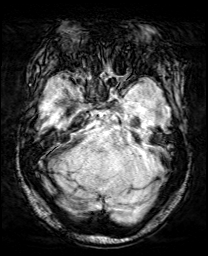
[im 37/56]
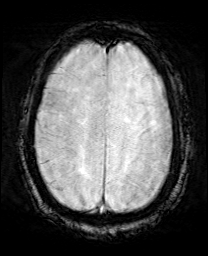
[im 56/56]
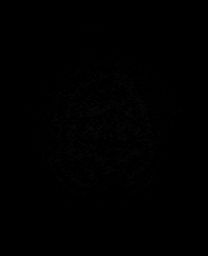

[Series 13: mip_images(sw) · axial · 24.0mm · 0.90mm/px · z∈[-110,+30]mm · 4 of 49 slices shown]
[im 1/49]
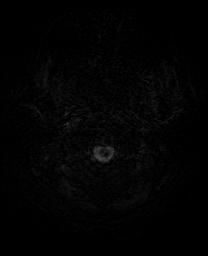
[im 17/49]
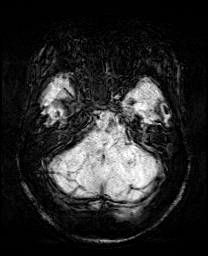
[im 33/49]
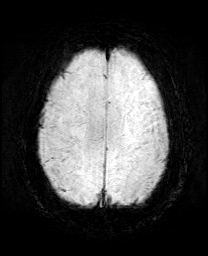
[im 49/49]
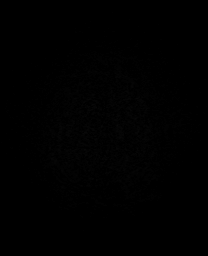

[Series 14: T1 · sagittal · 5.0mm · 0.81mm/px · 2 of 25 slices shown]
[im 1/25]
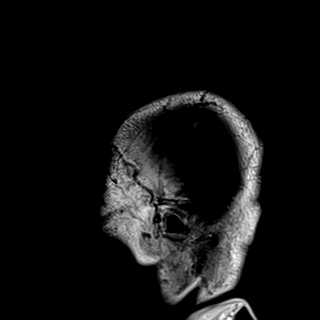
[im 25/25]
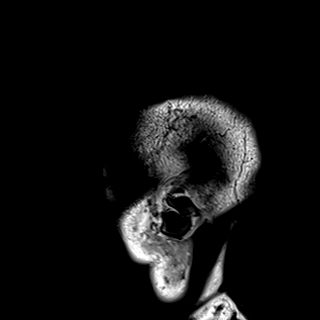

[Series 15: T2 · axial · 5.0mm · 0.72mm/px · z∈[-111,+29]mm · 2 of 25 slices shown (1 of 2)]
[im 1/25]
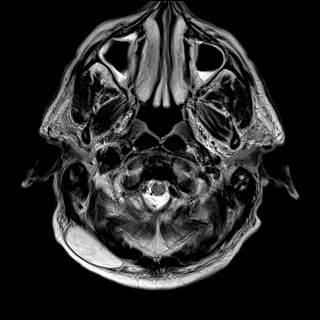
[im 25/25]
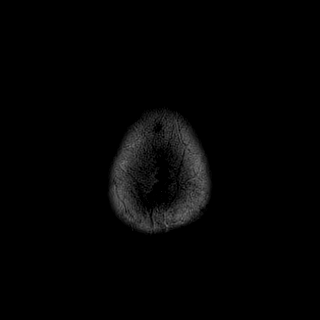

[Series 18: T2 · coronal · 5.0mm · 0.72mm/px · 2 of 30 slices shown (2 of 2)]
[im 1/30]
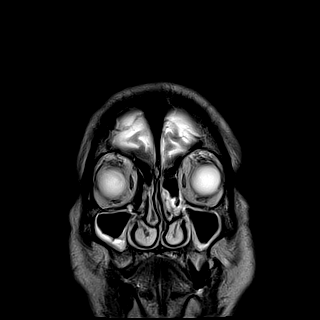
[im 30/30]
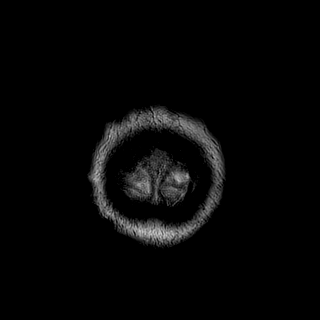

[44 of 48 positions shown; findings below may reference images not displayed]

FINDINGS: Brain:

Motion degraded examination.

There are two punctate acute/early subacute white matter infarcts
within the left corona radiata (series 5, image 82) (series 5, image
81).

No evidence of intracranial mass. No midline shift or extra-axial
fluid collection. Redemonstrated background of moderate chronic
small vessel ischemic disease with multiple chronic lacunar infarcts
within the bilateral cerebral white matter. Redemonstrated chronic
microhemorrhage within the left temporal lobe. Mild generalized
parenchymal atrophy.

Vascular: Unchanged abnormal appearance of the proximal left MCA
which was occluded on prior CT angiogram head 03/21/2019.

Skull and upper cervical spine: No focal marrow lesion

Sinuses/Orbits: Visualized orbits demonstrate no acute abnormality.
Mild to moderate scattered paranasal sinus mucosal thickening with
small left maxillary sinus air-fluid level. Left mastoid effusion.
Trace fluid also present within right mastoid air cells.

Other: Redemonstrated right suboccipital lipoma, partially imaged.

These results were called by telephone at the time of interpretation
on 04/27/2019 at [DATE] to provider VICTOR MANUEL ALAMEA , who verbally
acknowledged these results.
IMPRESSION: 1. Motion degraded examination.
2. Two punctate acute/early subacute white matter infarcts within
the left corona radiata.
3. Redemonstrated abnormal appearance of the proximal left middle
cerebral artery which was occluded on prior CT angiogram head
03/21/2019.
4. Generalized parenchymal atrophy. Redemonstrated moderate chronic
small vessel ischemic disease with multiple chronic lacunar infarcts
within the bilateral cerebral white matter.
5. Paranasal sinus disease with mastoid effusions, as described.
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
## 2020-12-29 ENCOUNTER — Ambulatory Visit (INDEPENDENT_AMBULATORY_CARE_PROVIDER_SITE_OTHER): Payer: Medicare Other | Admitting: Podiatry

## 2020-12-29 ENCOUNTER — Encounter: Payer: Self-pay | Admitting: Podiatry

## 2020-12-29 ENCOUNTER — Other Ambulatory Visit: Payer: Self-pay

## 2020-12-29 DIAGNOSIS — M79675 Pain in left toe(s): Secondary | ICD-10-CM

## 2020-12-29 DIAGNOSIS — M79674 Pain in right toe(s): Secondary | ICD-10-CM | POA: Diagnosis not present

## 2020-12-29 DIAGNOSIS — B351 Tinea unguium: Secondary | ICD-10-CM

## 2020-12-29 DIAGNOSIS — E1142 Type 2 diabetes mellitus with diabetic polyneuropathy: Secondary | ICD-10-CM

## 2020-12-30 NOTE — Progress Notes (Signed)
  Subjective:  Patient ID: Stephen Flynn, male    DOB: 02/14/1928,  MRN: [id number]  87 y.o. male presents with painful thick toenails that are difficult to trim. Pain interferes with ambulation. Aggravating factors include wearing enclosed shoe gear. Pain is relieved with periodic professional debridement.   PCP is Dr. [name].  Review of Systems: Negative except as noted in the HPI.   Allergies  Allergen Reactions   Penicillin G     Other reaction(s): Drowsy   Penicillins Hives    Did it involve swelling of the face/tongue/throat, SOB, or low BP? No Did it involve sudden or severe rash/hives, skin peeling, or any reaction on the inside of your mouth or nose? yes Did you need to seek medical attention at a hospital or doctor's office? yes When did it last happen?  child     If all above answers are "NO", may proceed with cephalosporin use.   Objective:  There were no vitals filed for this visit. Constitutional Patient is a pleasant 87 y.o. African American male obese in NAD.[name] Kitchen AAO x 3.  Vascular Capillary refill time to digits immediate b/l. Palpable pedal pulses b/l LE. Pedal hair absent. Lower extremity skin temperature gradient within normal limits. No ischemia or gangrene noted b/l lower extremities. No cyanosis or clubbing noted.  Neurologic Normal speech. Oriented to person, place, and time. Protective sensation diminished with 10g monofilament b/l. Proprioception intact bilaterally.  Dermatologic Pedal skin with normal turgor, texture and tone bilaterally. No open wounds bilaterally. No interdigital macerations bilaterally. Toenails 1-5 b/l elongated, discolored, dystrophic, thickened, crumbly with subungual debris and tenderness to dorsal palpation.  Orthopedic: Normal muscle strength 5/5 to all lower extremity muscle groups bilaterally. No pain crepitus or joint limitation noted with ROM b/l. No gross bony deformities bilaterally. Wearing appropriate fitting shoe gear as open toed  sandals on today's visit. Utilizes cane for ambulation assistance.   Assessment:   1. Pain due to onychomycosis of toenails of both feet   2. Diabetic peripheral neuropathy associated with type 2 diabetes mellitus ([facility])     Plan:  -Toenails 1-5 b/l were debrided in length and girth with sterile nail nippers and dremel without iatrogenic bleeding.  -Patient to report any pedal injuries to medical professional immediately. -Patient to continue soft, supportive shoe gear daily. -Patient/POA to call should there be question/concern in the interim.  Return in about 3 months (around 03/31/2021).  Marzetta Board, DPM

## 2021-04-08 ENCOUNTER — Encounter: Payer: Self-pay | Admitting: Podiatry

## 2021-04-08 ENCOUNTER — Other Ambulatory Visit: Payer: Self-pay

## 2021-04-08 ENCOUNTER — Ambulatory Visit (INDEPENDENT_AMBULATORY_CARE_PROVIDER_SITE_OTHER): Payer: Medicare Other | Admitting: Podiatry

## 2021-04-08 DIAGNOSIS — M79674 Pain in right toe(s): Secondary | ICD-10-CM

## 2021-04-08 DIAGNOSIS — B351 Tinea unguium: Secondary | ICD-10-CM

## 2021-04-08 DIAGNOSIS — M79675 Pain in left toe(s): Secondary | ICD-10-CM | POA: Diagnosis not present

## 2021-04-08 DIAGNOSIS — E1142 Type 2 diabetes mellitus with diabetic polyneuropathy: Secondary | ICD-10-CM | POA: Diagnosis not present

## 2021-04-15 NOTE — Progress Notes (Signed)
Subjective: Stephen Flynn is a 87 y.o. male patient seen today for follow up of  painful thick toenails that are difficult to trim. Pain interferes with ambulation. Aggravating factors include wearing enclosed shoe gear. Pain is relieved with periodic professional debridement.  New problems reported today: None.  He voices no new pedal problems on today's visit.  PCP is [name], MD. Last visit was: 04/05/2021.[name] Kitchen  Allergies  Allergen Reactions   Penicillin G     Other reaction(s): Drowsy   Penicillins Hives    Did it involve swelling of the face/tongue/throat, SOB, or low BP? No Did it involve sudden or severe rash/hives, skin peeling, or any reaction on the inside of your mouth or nose? yes Did you need to seek medical attention at a hospital or doctor's office? yes When did it last happen?  child     If all above answers are "NO", may proceed with cephalosporin use.    PCP is [name], MD .  Objective: Physical Exam  General: Patient is a pleasant 87 y.o. African American male obese in NAD. AAO x 3.   Neurovascular Examination: Capillary refill time to digits immediate b/l. Palpable DP pulse(s) b/l lower extremities Palpable PT pulse(s) b/l lower extremities Pedal hair sparse. Lower extremity skin temperature gradient within normal limits. No pain with calf compression b/l. Trace edema noted b/l lower extremities.  Protective sensation diminished with 10g monofilament b/l.  Dermatological:  Skin warm and supple b/l lower extremities. No open wounds b/l LE. No interdigital macerations b/l lower extremities. Toenails 1-5 b/l elongated, discolored, dystrophic, thickened, crumbly with subungual debris and tenderness to dorsal palpation.  Musculoskeletal:  Normal muscle strength 5/5 to all lower extremity muscle groups bilaterally. No pain crepitus or joint limitation noted with ROM b/l lower extremities.  Assessment: 1. Pain due to onychomycosis of toenails of both feet    2. Diabetic peripheral neuropathy associated with type 2 diabetes mellitus ([place])    Plan: Patient was evaluated and treated and all questions answered. Consent given for treatment as described below: -Examined patient. -Patient to continue soft, supportive shoe gear daily. -Toenails 1-5 b/l were debrided in length and girth with sterile nail nippers and dremel without iatrogenic bleeding.  -Patient to report any pedal injuries to medical professional immediately. -Patient/POA to call should there be question/concern in the interim.  Return in about 3 months (around 07/09/2021).  Marzetta Board, DPM

## 2021-07-15 ENCOUNTER — Encounter: Payer: Self-pay | Admitting: Podiatry

## 2021-07-15 ENCOUNTER — Ambulatory Visit: Payer: Medicare HMO | Admitting: Podiatry

## 2021-07-15 ENCOUNTER — Other Ambulatory Visit: Payer: Self-pay

## 2021-07-15 DIAGNOSIS — L600 Ingrowing nail: Secondary | ICD-10-CM | POA: Diagnosis not present

## 2021-07-15 DIAGNOSIS — E1142 Type 2 diabetes mellitus with diabetic polyneuropathy: Secondary | ICD-10-CM

## 2021-07-15 DIAGNOSIS — M79674 Pain in right toe(s): Secondary | ICD-10-CM

## 2021-07-15 DIAGNOSIS — R54 Age-related physical debility: Secondary | ICD-10-CM | POA: Insufficient documentation

## 2021-07-15 DIAGNOSIS — M79675 Pain in left toe(s): Secondary | ICD-10-CM | POA: Diagnosis not present

## 2021-07-15 DIAGNOSIS — R627 Adult failure to thrive: Secondary | ICD-10-CM | POA: Insufficient documentation

## 2021-07-15 DIAGNOSIS — I5189 Other ill-defined heart diseases: Secondary | ICD-10-CM | POA: Insufficient documentation

## 2021-07-15 DIAGNOSIS — Z0000 Encounter for general adult medical examination without abnormal findings: Secondary | ICD-10-CM | POA: Insufficient documentation

## 2021-07-15 DIAGNOSIS — E119 Type 2 diabetes mellitus without complications: Secondary | ICD-10-CM

## 2021-07-15 DIAGNOSIS — B351 Tinea unguium: Secondary | ICD-10-CM

## 2021-07-15 NOTE — Progress Notes (Signed)
ANNUAL DIABETIC FOOT EXAM  Subjective: Stephen Flynn presents today for for annual diabetic foot examination and painful elongated mycotic toenails 1-5 bilaterally which are tender when wearing enclosed shoe gear. Pain is relieved with periodic professional debridement.  Patient denies dx of diabetes, but [facility] diabetes.  Patient denies any h/o foot wounds.  Patient denies any numbness, tingling, burning, or pins/needle sensation in feet.  [name], MD is patient's PCP. Last visit was 04/05/2021.  Past Medical History:  Diagnosis Date   Anemia    Aortic insufficiency    Chest pain    H/O: GI bleed    Hyperlipidemia    Hypertension    Leg cramps    Stroke [facility])    Patient Active Problem List   Diagnosis Date Noted   Adult failure to thrive syndrome 07/15/2021   Advanced age 85/46/2703   Diastolic dysfunction 50/03/3817   Encounter for general adult medical examination without abnormal findings 07/15/2021   Chronic diastolic heart failure ([place]) 09/15/2020   H/O: stroke 09/15/2020   Other dyspnea and respiratory abnormality 09/15/2020   Venous insufficiency of leg 09/15/2020   Pain of right thigh 09/15/2020   Paronychia of great toe, right 12/09/2019   Pain due to onychomycosis of toenails of both feet 05/28/2019   Neuropathy 05/28/2019   Abnormal gait 03/31/2019   Aphasia 03/31/2019   Late effects of cerebrovascular disease 03/31/2019   Thoracic aortic aneurysm without rupture 03/31/2019   Ischemic stroke ([place]) 03/21/2019   Essential hypertension 03/21/2019   BPH (benign prostatic hyperplasia) 03/21/2019   Cataract 08/05/2018   Disequilibrium 10/24/2017   Constipation 07/27/2016   Heart failure ([place]) 03/31/2016   Malignant tumor of prostate ([place]) 01/25/2016   Hypertensive pulmonary arterial disease ([place]) 07/31/2013   Morbid obesity ([place]) 07/15/2013   Type 2 diabetes mellitus without complications ([place]) 29/93/7169    Sebaceous cyst 07/11/2012   Underimmunization status 04/11/2012   Aortic aneurysm ([place]) 04/19/2009   Hyperlipidemia 04/19/2009   Iron deficiency anemia 04/19/2009   UNSPECIFIED ANEMIA 07/25/2007   G E R D 07/25/2007   HIATAL HERNIA 07/25/2007   HEMORRHOIDS, INTERNAL 05/28/2007   UNS GASTRITIS&GASTRODUODITIS W/O MENTION HEMORR 05/28/2007   DUODENITIS WITHOUT MENTION OF HEMORRHAGE 05/28/2007   Past Surgical History:  Procedure Laterality Date   CARDIOVASCULAR STRESS TEST  09/04/2005   US ECHOCARDIOGRAPHY  11/26/2007   ef 55-60%   Current Outpatient Medications on File Prior to Visit  Medication Sig Dispense Refill   acetaminophen (TYLENOL) 500 MG tablet TAKE ONE TABLET BY MOUTH FOUR TIMES A DAY AS NEEDED - DISCONTINUE ADVIL     amLODipine (NORVASC) 5 MG tablet Take 5 mg by mouth daily.     aspirin 325 MG tablet Take 325 mg by mouth daily.     atorvastatin (LIPITOR) 80 MG tablet Take 1 tablet (80 mg total) by mouth daily at 6 PM. (Patient taking differently: Take 80 mg by mouth daily. ) 30 tablet 2   atorvastatin (LIPITOR) 80 MG tablet TAKE ONE TABLET BY MOUTH AT BEDTIME FOR CHOLESTEROL     Carboxymethylcellulose Sodium 0.25 % SOLN INSTILL 1 DROP IN BOTH EYES TWICE A DAY     clopidogrel (PLAVIX) 75 MG tablet Take 1 tablet by mouth daily.     diclofenac Sodium (VOLTAREN) 1 % GEL APPLY 2 GRAMS TO AFFECTED AREA TWICE A DAY AS NEEDED     ferrous sulfate 325 (65 FE) MG tablet TAKE ONE TABLET BY MOUTH MONDAY, WEDNESDAY  AND FRIDAY TAKE WITH VITAMIN C OR A GLASS OF ORANGE JUICE     furosemide (LASIX) 20 MG tablet Take 1 tablet by mouth daily.     hydrALAZINE (APRESOLINE) 25 MG tablet Take 25 mg by mouth 2 (two) times daily.     ibuprofen (ADVIL) 200 MG tablet Take 400 mg by mouth every 6 (six) hours as needed for fever, headache or moderate pain.     metFORMIN (GLUCOPHAGE) 500 MG tablet Take 1 tablet (500 mg total) by mouth 2 (two) times daily with a meal. 60 tablet 11   Polyethyl Glycol-Propyl  Glycol (SYSTANE FREE OP) Place 1 drop into both eyes daily as needed (dry eyes).      polyethylene glycol powder (GLYCOLAX/MIRALAX) 17 GM/SCOOP powder TAKE 17 GRAMS BY MOUTH TWICE A DAY (MIX WITH WATER OR OTHER LIQUID AS INSTRUCTED BEFORE DRINKING) FOR CONSTIPATION     senna-docusate (SENOKOT-S) 8.6-50 MG tablet TAKE 2 TABLETS BY MOUTH AT BEDTIME FOR CONSTIPATION     tamsulosin (FLOMAX) 0.4 MG CAPS capsule Take 0.4 mg by mouth.     No current facility-administered medications on file prior to visit.    Allergies  Allergen Reactions   Penicillin G     Other reaction(s): Drowsy   Penicillins Hives    Did it involve swelling of the face/tongue/throat, SOB, or low BP? No Did it involve sudden or severe rash/hives, skin peeling, or any reaction on the inside of your mouth or nose? yes Did you need to seek medical attention at a hospital or doctor's office? yes When did it last happen?  child     If all above answers are NO, may proceed with cephalosporin use.   Social History   Occupational History   Not on file  Tobacco Use   Smoking status: Former   Smokeless tobacco: Never  Substance and Sexual Activity   Alcohol use: No   Drug use: No   Sexual activity: Not on file   History reviewed. No pertinent family history. Immunization History  Administered Date(s) Administered   Fluad Quad(high Dose 65+) 05/20/2020   Influenza, High Dose Seasonal PF 03/03/2017   Influenza,inj,Quad PF,6+ Mos 07/02/2018   Influenza-Unspecified 04/02/2012, 04/07/2013, 04/17/2019   PFIZER(Purple Top)SARS-COV-2 Vaccination 08/26/2019, 09/16/2019, 05/02/2020   Pneumococcal Conjugate-13 04/20/2015   Pneumococcal Polysaccharide-23 05/08/2013   Pneumococcal-Unspecified 04/02/2009   Tdap 01/31/2009   Zoster Recombinat (Shingrix) 11/13/2019   Zoster, Live 05/08/2013     Review of Systems: Negative except as noted in the HPI.   Objective: There were no vitals filed for this visit.  Stephen Flynn is a  pleasant 88 y.o. male in NAD. AAO X 3.  Vascular Examination: CFT immediate b/l LE. Palpable DP/PT pulses b/l LE. Digital hair sparse b/l. Skin temperature gradient WNL b/l. No pain with calf compression b/l. Trace edema b/l LE. No cyanosis or clubbing noted b/l LE.  Dermatological Examination: Pedal integument with normal turgor, texture and tone BLE. No open wounds b/l LE. No interdigital macerations noted b/l LE. Toenails 2-5 bilaterally and L hallux elongated, discolored, dystrophic, thickened, and crumbly with subungual debris and tenderness to dorsal palpation. Incurvated nailplate medial border(s) R hallux.  Nail border hypertrophy minimal. There is tenderness to palpation. Sign(s) of infection: no clinical signs of infection noted on examination today..  Musculoskeletal Examination: Muscle strength 5/5 to all lower extremity muscle groups bilaterally. No pain, crepitus or joint limitation noted with ROM bilateral LE. No gross bony deformities bilaterally.  Footwear Assessment: Does the patient  wear appropriate shoes? Yes. Does the patient need inserts/orthotics? No.  Neurological Examination: Protective sensation decreased with 10 gram monofilament b/l.  Assessment: 1. Pain due to onychomycosis of toenails of both feet   2. Ingrown toenail without infection   3. Diabetic peripheral neuropathy associated with type 2 diabetes mellitus ([place])   4. Encounter for diabetic foot exam ([place])     ADA Risk Categorization: Low Risk :  Patient has all of the following: Intact protective sensation No prior foot ulcer  No severe deformity Pedal pulses present  Plan: -Diabetic foot examination performed today. -Continue foot and shoe inspections daily. Monitor blood glucose per PCP/Endocrinologist's recommendations. -Mycotic toenails 2-5 bilaterally and L hallux were debrided in length and girth with sterile nail nippers and dremel without iatrogenic bleeding. -Offending nail border  debrided and curretaged medial border right hallux utilizing sterile nail nipper and currette. Border(s) cleansed with alcohol and triple antibiotic ointment applied. Patient instructed to apply Neosporin Cream  to R hallux once daily for 7 days. -Discussed need for partial nail avulsion right great toe if condition does no improve. They related understanding. -Patient/POA to call should there be question/concern in the interim.  Return in about 3 months (around 10/13/2021).  Marzetta Board, DPM

## 2021-10-06 DIAGNOSIS — D509 Iron deficiency anemia, unspecified: Secondary | ICD-10-CM | POA: Diagnosis not present

## 2021-10-06 DIAGNOSIS — E119 Type 2 diabetes mellitus without complications: Secondary | ICD-10-CM | POA: Diagnosis not present

## 2021-10-06 DIAGNOSIS — I5189 Other ill-defined heart diseases: Secondary | ICD-10-CM | POA: Diagnosis not present

## 2021-10-06 DIAGNOSIS — E785 Hyperlipidemia, unspecified: Secondary | ICD-10-CM | POA: Diagnosis not present

## 2021-10-06 DIAGNOSIS — N401 Enlarged prostate with lower urinary tract symptoms: Secondary | ICD-10-CM | POA: Diagnosis not present

## 2021-10-06 DIAGNOSIS — I700 Atherosclerosis of aorta: Secondary | ICD-10-CM | POA: Diagnosis not present

## 2021-10-06 DIAGNOSIS — I10 Essential (primary) hypertension: Secondary | ICD-10-CM | POA: Diagnosis not present

## 2021-10-06 DIAGNOSIS — C61 Malignant neoplasm of prostate: Secondary | ICD-10-CM | POA: Diagnosis not present

## 2021-10-18 ENCOUNTER — Ambulatory Visit (INDEPENDENT_AMBULATORY_CARE_PROVIDER_SITE_OTHER): Payer: Medicare HMO | Admitting: Podiatry

## 2021-10-18 ENCOUNTER — Encounter: Payer: Self-pay | Admitting: Podiatry

## 2021-10-18 DIAGNOSIS — M79675 Pain in left toe(s): Secondary | ICD-10-CM | POA: Diagnosis not present

## 2021-10-18 DIAGNOSIS — M79674 Pain in right toe(s): Secondary | ICD-10-CM

## 2021-10-18 DIAGNOSIS — R609 Edema, unspecified: Secondary | ICD-10-CM | POA: Insufficient documentation

## 2021-10-18 DIAGNOSIS — E1142 Type 2 diabetes mellitus with diabetic polyneuropathy: Secondary | ICD-10-CM | POA: Diagnosis not present

## 2021-10-18 DIAGNOSIS — B351 Tinea unguium: Secondary | ICD-10-CM | POA: Diagnosis not present

## 2021-10-28 NOTE — Progress Notes (Signed)
?  Subjective:  ?Patient ID: Stephen Flynn, male    DOB: Dec 22, 1927,  MRN: [id number] ? ?Stephen Flynn presents to clinic today for at risk foot care with history of diabetic neuropathy and painful elongated mycotic toenails 1-5 bilaterally which are tender when wearing enclosed shoe gear. Pain is relieved with periodic professional debridement. ? ?New problem(s): None.  ? ?PCP is [name], MD , and last visit was October 06, 2021. ? ?Allergies  ?Allergen Reactions  ? Penicillin G   ?  Other reaction(s): Drowsy  ? Penicillins Hives  ?  Did it involve swelling of the face/tongue/throat, SOB, or low BP? No ?Did it involve sudden or severe rash/hives, skin peeling, or any reaction on the inside of your mouth or nose? yes ?Did you need to seek medical attention at a hospital or doctor's office? yes ?When did it last happen?  child     ?If all above answers are ?NO?, may proceed with cephalosporin use.  ? ? ?Review of Systems: Negative except as noted in the HPI. ? ?Objective: No changes noted in today's physical examination. ? ?There were no vitals filed for this visit. ? ?Stephen Flynn is a pleasant 88 y.o. male in NAD. AAO X 3. ? ?Vascular Examination: ?CFT immediate b/l LE. Palpable DP/PT pulses b/l LE. Digital hair sparse b/l. Skin temperature gradient WNL b/l. No pain with calf compression b/l. Trace edema b/l LE. No cyanosis or clubbing noted b/l LE. ? ?Dermatological Examination: ?Pedal integument with normal turgor, texture and tone BLE. No open wounds b/l LE. No interdigital macerations noted b/l LE. Toenails 2-5 bilaterally and L hallux elongated, discolored, dystrophic, thickened, and crumbly with subungual debris and tenderness to dorsal palpation. Incurvated nailplate medial border(s) R hallux.  Nail border hypertrophy minimal. There is tenderness to palpation. Sign(s) of infection: no clinical signs of infection noted on examination today.. ? ?Musculoskeletal Examination: ?Muscle strength 5/5 to all lower  extremity muscle groups bilaterally. No pain, crepitus or joint limitation noted with ROM bilateral LE. No gross bony deformities bilaterally. ? ?Neurological Examination: ?Protective sensation decreased with 10 gram monofilament b/l. ? ?Assessment/Plan: ?1. Pain due to onychomycosis of toenails of both feet   ?2. Diabetic peripheral neuropathy associated with type 2 diabetes mellitus ([place])   ?  ?-Patient was evaluated and treated. All patient's and/or POA's questions/concerns answered on today's visit. ?-Patient to continue soft, supportive shoe gear daily. ?-Toenails 2-5 bilaterally and L hallux debrided in length and girth without iatrogenic bleeding with sterile nail nipper and dremel.  ?-Offending nail border debrided and curretaged R hallux utilizing sterile nail nipper and currette. Border cleansed with alcohol and triple antibiotic applied. No further treatment required by patient/caregiver. ?-Patient/POA to call should there be question/concern in the interim.  ? ?Return in about 10 weeks (around 12/27/2021). ? ?Marzetta Board, DPM  ?

## 2021-12-30 DIAGNOSIS — E785 Hyperlipidemia, unspecified: Secondary | ICD-10-CM | POA: Diagnosis not present

## 2021-12-30 DIAGNOSIS — I10 Essential (primary) hypertension: Secondary | ICD-10-CM | POA: Diagnosis not present

## 2022-01-18 ENCOUNTER — Ambulatory Visit (INDEPENDENT_AMBULATORY_CARE_PROVIDER_SITE_OTHER): Payer: Medicare HMO | Admitting: Podiatry

## 2022-01-18 ENCOUNTER — Encounter: Payer: Self-pay | Admitting: Podiatry

## 2022-01-18 DIAGNOSIS — E1142 Type 2 diabetes mellitus with diabetic polyneuropathy: Secondary | ICD-10-CM

## 2022-01-18 DIAGNOSIS — B351 Tinea unguium: Secondary | ICD-10-CM

## 2022-01-18 DIAGNOSIS — M79674 Pain in right toe(s): Secondary | ICD-10-CM

## 2022-01-18 DIAGNOSIS — M79675 Pain in left toe(s): Secondary | ICD-10-CM

## 2022-01-18 NOTE — Patient Instructions (Signed)
Recommend Skechers Loafers with stretchable uppers and memory foam insoles. They can be purchased at [employer], [employer] or [employer]. Also on [email or web address].  

## 2022-01-25 NOTE — Progress Notes (Signed)
  Subjective:  Patient ID: Stephen Flynn, male    DOB: Feb 04, 1928,  MRN: [id number]  Stephen Flynn presents to clinic today for at risk foot care with history of diabetic neuropathy and painful thick toenails that are difficult to trim. Pain interferes with ambulation. Aggravating factors include wearing enclosed shoe gear. Pain is relieved with periodic professional debridement.  Patient states right great toe is sore.  PCP is [name], MD , and last visit was  October 06, 2021  Allergies  Allergen Reactions   Penicillin G     Other reaction(s): Drowsy   Penicillins Hives    Did it involve swelling of the face/tongue/throat, SOB, or low BP? No Did it involve sudden or severe rash/hives, skin peeling, or any reaction on the inside of your mouth or nose? yes Did you need to seek medical attention at a hospital or doctor's office? yes When did it last happen?  child     If all above answers are "NO", may proceed with cephalosporin use.    Review of Systems: Negative except as noted in the HPI.  Objective: No changes noted in today's physical examination. There were no vitals filed for this visit.  Stephen Flynn is a pleasant 88 y.o. male in NAD. AAO X 3.  Vascular Examination: CFT immediate b/l LE. Palpable DP/PT pulses b/l LE. Digital hair sparse b/l. Skin temperature gradient WNL b/l. No pain with calf compression b/l. Trace edema b/l LE. No cyanosis or clubbing noted b/l LE.  Dermatological Examination: Pedal integument with normal turgor, texture and tone BLE. No open wounds b/l LE. No interdigital macerations noted b/l LE. Toenails 2-5 bilaterally and L hallux elongated, discolored, dystrophic, thickened, and crumbly with subungual debris and tenderness to dorsal palpation. Incurvated nailplate medial border(s) R hallux.  Nail border hypertrophy minimal. There is tenderness to palpation. Sign(s) of infection: no clinical signs of infection noted on examination  today.  Musculoskeletal Examination: Muscle strength 5/5 to all lower extremity muscle groups bilaterally. No pain, crepitus or joint limitation noted with ROM bilateral LE. No gross bony deformities bilaterally.  Neurological Examination: Protective sensation decreased with 10 gram monofilament b/l.  Assessment/Plan: 1. Pain due to onychomycosis of toenails of both feet   2. Diabetic peripheral neuropathy associated with type 2 diabetes mellitus ([place])      -Examined patient. -Patient to continue soft, supportive shoe gear daily. -Mycotic toenails 2-5 bilaterally and left great toe were debrided in length and girth with sterile nail nippers and dremel without iatrogenic bleeding. -Offending nail border debrided and curretaged medial border right hallux utilizing sterile nail nipper and currette. Border(s) cleansed with alcohol and TAO applied. Patient/POA/Caregiver/Facility instructed to apply Neosporin Cream  to right great toe once daily for 7 days. Call office if there are any concerns. -Recommended Skechers Slip In shoes with stretchable uppers and memory foam insoles. They can be purchased at [employer], [employer] or [employer]. Also on [email or web address].  -Patient/POA to call should there be question/concern in the interim.   Return in about 10 weeks (around 03/29/2022).  Marzetta Board, DPM

## 2022-04-17 DIAGNOSIS — I10 Essential (primary) hypertension: Secondary | ICD-10-CM | POA: Diagnosis not present

## 2022-04-17 DIAGNOSIS — R7989 Other specified abnormal findings of blood chemistry: Secondary | ICD-10-CM | POA: Diagnosis not present

## 2022-04-17 DIAGNOSIS — E785 Hyperlipidemia, unspecified: Secondary | ICD-10-CM | POA: Diagnosis not present

## 2022-04-17 DIAGNOSIS — Z125 Encounter for screening for malignant neoplasm of prostate: Secondary | ICD-10-CM | POA: Diagnosis not present

## 2022-04-17 DIAGNOSIS — E119 Type 2 diabetes mellitus without complications: Secondary | ICD-10-CM | POA: Diagnosis not present

## 2022-04-24 DIAGNOSIS — Z23 Encounter for immunization: Secondary | ICD-10-CM | POA: Diagnosis not present

## 2022-04-24 DIAGNOSIS — I6990 Unspecified sequelae of unspecified cerebrovascular disease: Secondary | ICD-10-CM | POA: Diagnosis not present

## 2022-04-24 DIAGNOSIS — R600 Localized edema: Secondary | ICD-10-CM | POA: Diagnosis not present

## 2022-04-24 DIAGNOSIS — I719 Aortic aneurysm of unspecified site, without rupture: Secondary | ICD-10-CM | POA: Diagnosis not present

## 2022-04-24 DIAGNOSIS — R2689 Other abnormalities of gait and mobility: Secondary | ICD-10-CM | POA: Diagnosis not present

## 2022-04-24 DIAGNOSIS — E119 Type 2 diabetes mellitus without complications: Secondary | ICD-10-CM | POA: Diagnosis not present

## 2022-04-24 DIAGNOSIS — Z0000 Encounter for general adult medical examination without abnormal findings: Secondary | ICD-10-CM | POA: Diagnosis not present

## 2022-04-24 DIAGNOSIS — I77819 Aortic ectasia, unspecified site: Secondary | ICD-10-CM | POA: Diagnosis not present

## 2022-04-24 DIAGNOSIS — K5900 Constipation, unspecified: Secondary | ICD-10-CM | POA: Diagnosis not present

## 2022-04-24 DIAGNOSIS — I2721 Secondary pulmonary arterial hypertension: Secondary | ICD-10-CM | POA: Diagnosis not present

## 2022-04-24 DIAGNOSIS — R82998 Other abnormal findings in urine: Secondary | ICD-10-CM | POA: Diagnosis not present

## 2022-05-03 ENCOUNTER — Encounter: Payer: Self-pay | Admitting: Podiatry

## 2022-05-03 ENCOUNTER — Ambulatory Visit (INDEPENDENT_AMBULATORY_CARE_PROVIDER_SITE_OTHER): Payer: Medicare HMO | Admitting: Podiatry

## 2022-05-03 DIAGNOSIS — M79675 Pain in left toe(s): Secondary | ICD-10-CM

## 2022-05-03 DIAGNOSIS — B351 Tinea unguium: Secondary | ICD-10-CM

## 2022-05-03 DIAGNOSIS — M79674 Pain in right toe(s): Secondary | ICD-10-CM

## 2022-05-03 DIAGNOSIS — E1142 Type 2 diabetes mellitus with diabetic polyneuropathy: Secondary | ICD-10-CM

## 2022-05-03 NOTE — Progress Notes (Signed)
  Subjective:  Patient ID: Stephen Flynn, male    DOB: [birth year],  MRN: [id number]  Stephen Flynn presents to clinic today for at risk foot care with history of diabetic neuropathy and painful thick toenails that are difficult to trim. Pain interferes with ambulation. Aggravating factors include wearing enclosed shoe gear. Pain is relieved with periodic professional debridement.. Chief Complaint  Patient presents with   Nail Problem    Routine foot care PCP-[name] PCP [facility]-04/24/2022   New problem(s): None.   PCP is [name], MD , and last visit was December 30, 2021.  Allergies  Allergen Reactions   Penicillin G     Other reaction(s): Drowsy   Penicillins Hives    Did it involve swelling of the face/tongue/throat, SOB, or low BP? No Did it involve sudden or severe rash/hives, skin peeling, or any reaction on the inside of your mouth or nose? yes Did you need to seek medical attention at a hospital or doctor's office? yes When did it last happen?  child     If all above answers are "NO", may proceed with cephalosporin use.    Review of Systems: Negative except as noted in the HPI.  Objective: No changes noted in today's physical examination.  Stephen Flynn is a pleasant 88 y.o. male morbidly obese in NAD. AAO x 3. . Vascular Examination: CFT immediate b/l LE. Palpable DP/PT pulses b/l LE. Digital hair sparse b/l. Skin temperature gradient WNL b/l. No pain with calf compression b/l. Trace edema b/l LE. No cyanosis or clubbing noted b/l LE.  Dermatological Examination: Pedal integument with normal turgor, texture and tone BLE. No open wounds b/l LE. No interdigital macerations noted b/l LE. Toenails 2-5 bilaterally and L hallux elongated, discolored, dystrophic, thickened, and crumbly with subungual debris and tenderness to dorsal palpation.   Incurvated nailplate medial border(s) R hallux.  Nail border hypertrophy minimal. There is tenderness to palpation. Sign(s) of infection: no  clinical signs of infection noted on examination today.  Musculoskeletal Examination: Muscle strength 5/5 to all lower extremity muscle groups bilaterally. No pain, crepitus or joint limitation noted with ROM bilateral LE. No gross bony deformities bilaterally.  Neurological Examination: Protective sensation decreased with 10 gram monofilament b/l.  Assessment/Plan: 1. Pain due to onychomycosis of toenails of both feet   2. Diabetic peripheral neuropathy associated with type 2 diabetes mellitus ([facility])     No orders of the defined types were placed in this encounter.   -Consent given for treatment as described below: -Examined patient. -Continue supportive shoe gear daily. -Toenails 2-5 bilaterally and left great toe debrided in length and girth without iatrogenic bleeding with sterile nail nipper and dremel.  -No invasive procedure(s) performed. Offending nail border debrided and curretaged medial border right hallux utilizing sterile nail nipper and currette. Border(s) cleansed with alcohol and triple antibiotic ointment applied. Dispensed written instructions for once daily epsom salt soaks for 7 days. Call office if there are any concerns. -Patient/POA to call should there be question/concern in the interim.   Return in about 3 months (around 08/03/2022).  Marzetta Board, DPM

## 2022-08-16 ENCOUNTER — Other Ambulatory Visit: Payer: Self-pay

## 2022-08-16 ENCOUNTER — Encounter (HOSPITAL_COMMUNITY): Payer: Self-pay

## 2022-08-16 ENCOUNTER — Emergency Department (HOSPITAL_COMMUNITY)
Admission: EM | Admit: 2022-08-16 | Discharge: 2022-08-16 | Disposition: A | Discharge status: Home / Self Care | Payer: 59 | Attending: Emergency Medicine | Admitting: Emergency Medicine

## 2022-08-16 ENCOUNTER — Emergency Department (HOSPITAL_COMMUNITY): Payer: 59

## 2022-08-16 DIAGNOSIS — W19XXXA Unspecified fall, initial encounter: Secondary | ICD-10-CM

## 2022-08-16 DIAGNOSIS — Z8673 Personal history of transient ischemic attack (TIA), and cerebral infarction without residual deficits: Secondary | ICD-10-CM | POA: Insufficient documentation

## 2022-08-16 DIAGNOSIS — Z7901 Long term (current) use of anticoagulants: Secondary | ICD-10-CM | POA: Diagnosis not present

## 2022-08-16 DIAGNOSIS — W07XXXA Fall from chair, initial encounter: Secondary | ICD-10-CM | POA: Diagnosis not present

## 2022-08-16 DIAGNOSIS — I4891 Unspecified atrial fibrillation: Secondary | ICD-10-CM | POA: Insufficient documentation

## 2022-08-16 DIAGNOSIS — I5030 Unspecified diastolic (congestive) heart failure: Secondary | ICD-10-CM | POA: Insufficient documentation

## 2022-08-16 DIAGNOSIS — I110 Hypertensive heart disease with heart failure: Secondary | ICD-10-CM | POA: Diagnosis not present

## 2022-08-16 DIAGNOSIS — Z79899 Other long term (current) drug therapy: Secondary | ICD-10-CM | POA: Diagnosis not present

## 2022-08-16 DIAGNOSIS — R519 Headache, unspecified: Secondary | ICD-10-CM | POA: Diagnosis present

## 2022-08-16 LAB — CBC WITH DIFFERENTIAL/PLATELET
Abs Immature Granulocytes: 0.01 10*3/uL (ref 0.00–0.07)
Basophils Absolute: 0 10*3/uL (ref 0.0–0.1)
Basophils Relative: 1 %
Eosinophils Absolute: 0.2 10*3/uL (ref 0.0–0.5)
Eosinophils Relative: 6 %
HCT: 31.1 % — ABNORMAL LOW (ref 39.0–52.0)
Hemoglobin: 10.1 g/dL — ABNORMAL LOW (ref 13.0–17.0)
Immature Granulocytes: 0 %
Lymphocytes Relative: 49 %
Lymphs Abs: 1.9 10*3/uL (ref 0.7–4.0)
MCH: 31 pg (ref 26.0–34.0)
MCHC: 32.5 g/dL (ref 30.0–36.0)
MCV: 95.4 fL (ref 80.0–100.0)
Monocytes Absolute: 0.3 10*3/uL (ref 0.1–1.0)
Monocytes Relative: 7 %
Neutro Abs: 1.4 10*3/uL — ABNORMAL LOW (ref 1.7–7.7)
Neutrophils Relative %: 37 %
Platelets: 186 10*3/uL (ref 150–400)
RBC: 3.26 MIL/uL — ABNORMAL LOW (ref 4.22–5.81)
RDW: 15.1 % (ref 11.5–15.5)
WBC: 3.8 10*3/uL — ABNORMAL LOW (ref 4.0–10.5)
nRBC: 0 % (ref 0.0–0.2)

## 2022-08-16 LAB — TSH: TSH: 1.944 u[IU]/mL (ref 0.350–4.500)

## 2022-08-16 LAB — BASIC METABOLIC PANEL
Anion gap: 8 (ref 5–15)
BUN: 14 mg/dL (ref 8–23)
CO2: 25 mmol/L (ref 22–32)
Calcium: 9.5 mg/dL (ref 8.9–10.3)
Chloride: 105 mmol/L (ref 98–111)
Creatinine, Ser: 0.92 mg/dL (ref 0.61–1.24)
GFR, Estimated: >60 mL/min (ref >60)
Glucose, Bld: 108 mg/dL — ABNORMAL HIGH (ref 70–99)
Potassium: 3.7 mmol/L (ref 3.5–5.1)
Sodium: 138 mmol/L (ref 135–145)

## 2022-08-16 LAB — MAGNESIUM: Magnesium: 1.9 mg/dL (ref 1.7–2.4)

## 2022-08-16 LAB — T4, FREE: Free T4: 1.11 ng/dL (ref 0.61–1.12)

## 2022-08-16 MED ORDER — APIXABAN 5 MG PO TABS
5.0000 mg | ORAL_TABLET | Freq: Once | ORAL | Status: AC
  Administered 2022-08-16: 5 mg via ORAL
  Filled 2022-08-16: qty 1

## 2022-08-16 MED ORDER — APIXABAN 5 MG PO TABS: 5.0000 mg | ORAL_TABLET | Freq: Two times a day (BID) | ORAL | 0 refills | Status: DC

## 2022-08-16 NOTE — ED Provider Notes (Signed)
Patient here after mechanical fall.  Head and neck CT are unremarkable but he appears to be in A-fib that is rate controlled.  He is not aware of history of A-fib.  He is on aspirin and Plavix for CVA history.  He has no obvious contraindications for anticoagulation per my discussion with the patient.  Basic labs are drawn and overall unremarkable, no significant anemia or electrolyte abnormality or kidney injury.  Talked with Dr. Burt Knack with cardiology and overall does agree that he needs to be on Eliquis due to A-fib and history of CVA.  CHA2DS2-VASc score 6.  Overall I have arranged for him to follow-up in atrial fibrillation clinic.  Right now he does not need to be appear in any rate control medication.  Will have him discontinue his aspirin and Plavix and continue on Eliquis.  He was educated about this.  Discharged in good condition.  This chart was dictated using voice recognition software.  Despite best efforts to proofread,  errors can occur which can change the documentation meaning.     Lennice Sites, DO 08/16/22 1939

## 2022-08-16 NOTE — ED Triage Notes (Incomplete)
Trauma Response Nurse Documentation   Stephen Flynn is a 89 y.o. male arriving to [facility] ED via [facility]  On clopidogrel 75 mg daily. Trauma was activated as a Level 2 by 15 based on the following trauma criteria Elderly patients > 65 with head trauma on anti-coagulation (excluding ASA). Trauma team at the bedside on patient arrival.   Patient cleared for CT by Dr. Billy Fischer. RN remained with the patient throughout their absence from the department for clinical observation.   GCS 15.  History   Past Medical History:  Diagnosis Date   Anemia    Aortic insufficiency    Chest pain    H/O: GI bleed    Hyperlipidemia    Hypertension    Leg cramps    Stroke [facility])      Past Surgical History:  Procedure Laterality Date   CARDIOVASCULAR STRESS TEST  09/04/2005   US ECHOCARDIOGRAPHY  11/26/2007   ef 55-60%       Initial Focused Assessment (If applicable, or please see trauma documentation):  Airway -- clear Breathing - unlabored Circulation- no bleeding, no hematomas GCS 15   CT's Completed:   CT Head   Interventions:   Labs CT scan  Plan for disposition:  {Trauma Dispo:26867}   Consults completed:  {Trauma Consults:26862} at ***.  Event Summary:  Pt was sitting in rolling chair, triaed to stand up, it rolled away, he fell, chair fell onto him. No obvious injuries. Moves all extremities, no weakness noted.;    Bedside handoff with ED RN Raquel Sarna.    Stephen Flynn  Trauma Response RN  Please call TRN at [phone] for further assistance.

## 2022-08-16 NOTE — Progress Notes (Signed)
Orthopedic Tech Progress Note Patient Details:  [name] [birth year] [id number]  Level 2 trauma   Patient ID: Stephen Flynn, male   DOB: [birth year], 89 y.o.   MRN: [id number]  Janit Pagan 08/16/2022, 1:48 PM

## 2022-08-16 NOTE — ED Notes (Signed)
Called CT regarding pt needing their CT

## 2022-08-16 NOTE — ED Provider Notes (Signed)
[facility] Provider Note   CSN: [id number] Arrival date & time: 08/16/22  1332     History {Add pertinent medical, surgical, social history, OB history to HPI:1} Chief Complaint  Patient presents with   fall on thinners   level 2    Stephen Flynn is a 89 y.o. male.  HPI     Home Medications Prior to Admission medications   Medication Sig Start Date End Date Taking? Authorizing Provider  acetaminophen (TYLENOL) 500 MG tablet TAKE ONE TABLET BY MOUTH FOUR TIMES A DAY AS NEEDED - DISCONTINUE ADVIL 11/11/20   [provider]  amLODipine (NORVASC) 5 MG tablet Take 1 tablet by mouth daily.    [provider]  aspirin EC 325 MG tablet Take 1 tablet po q daily 03/24/19   [provider]  atorvastatin (LIPITOR) 80 MG tablet Take 1 tablet po q daily    [provider]  Carboxymethylcellulose Sodium 0.25 % SOLN INSTILL 1 DROP IN BOTH EYES TWICE A DAY 08/13/20   [provider]  clopidogrel (PLAVIX) 75 MG tablet Take 1 tablet by mouth daily. 07/23/20   [provider]  diclofenac Sodium (VOLTAREN) 1 % GEL APPLY 2 GRAMS TO AFFECTED AREA TWICE A DAY AS NEEDED 05/20/20   [provider]  ferrous sulfate 325 (65 FE) MG tablet TAKE ONE TABLET BY MOUTH MONDAY, WEDNESDAY AND FRIDAY TAKE WITH VITAMIN C OR A GLASS OF ORANGE JUICE 05/25/20   [provider]  furosemide (LASIX) 20 MG tablet Take 1 tablet by mouth daily. 07/23/20   [provider]  gabapentin (NEURONTIN) 100 MG capsule Take 1 capsule by mouth 2 (two) times daily. 07/14/21   [provider]  hydrALAZINE (APRESOLINE) 25 MG tablet Take 1 tablet by mouth 2 (two) times daily.    [provider]  ibuprofen (ADVIL) 200 MG tablet Take 400 mg by mouth every 6 (six) hours as needed for fever, headache or moderate pain.    [provider]  metFORMIN (GLUCOPHAGE) 500 MG tablet TAKE ONE TABLET BY MOUTH  TWICE A DAY FOR DIABETES (ANNUAL KIDNEY FUNCTION TESTING IS NEEDED) 07/14/21   [provider]  Polyethyl Glycol-Propyl Glycol (SYSTANE FREE OP) Place 1 drop into both eyes daily as needed (dry eyes).     [provider]  polyethylene glycol powder (GLYCOLAX/MIRALAX) 17 GM/SCOOP powder TAKE 17 GRAMS BY MOUTH TWICE A DAY (MIX WITH WATER OR OTHER LIQUID AS INSTRUCTED BEFORE DRINKING) FOR CONSTIPATION 07/23/20   [provider]  senna-docusate (SENOKOT-S) 8.6-50 MG tablet TAKE 2 TABLETS BY MOUTH AT BEDTIME FOR CONSTIPATION 07/23/20   [provider]  tamsulosin (FLOMAX) 0.4 MG CAPS capsule Take 0.4 mg by mouth.    [provider]      Allergies    Penicillin g and Penicillins    Review of Systems   Review of Systems  Physical Exam Updated Vital Signs BP (S) (!) 168/102   Pulse (S) 75   Temp (S) 97.6 F (36.4 C) (Oral)   Resp (S) 16   Ht 5' 8"$  (1.727 m)   Wt 112 kg   SpO2 (S) 99%   BMI 37.56 kg/m  Physical Exam  ED Results / Procedures / Treatments   Labs (all labs ordered are listed, but only abnormal results are displayed) Labs Reviewed - No data to display  EKG None  Radiology No results found.  Procedures Procedures  {Document cardiac monitor, telemetry assessment procedure  when appropriate:1}  Medications Ordered in ED Medications - No data to display  ED Course/ Medical Decision Making/ A&P   {   Click here for ABCD2, HEART and other calculatorsREFRESH Note before signing :1}                          Medical Decision Making  ***  {Document critical care time when appropriate:1} {Document review of labs and clinical decision tools ie heart score, Chads2Vasc2 etc:1}  {Document your independent review of radiology images, and any outside records:1} {Document your discussion with family members, caretakers, and with consultants:1} {Document social determinants of health affecting pt's care:1} {Document your decision  making why or why not admission, treatments were needed:1} Final Clinical Impression(s) / ED Diagnoses Final diagnoses:  None    Rx / DC Orders ED Discharge Orders     None

## 2022-08-16 NOTE — ED Notes (Signed)
Called lab to have labs added to previous collection of blood. Per lab, they will add them on

## 2022-08-16 NOTE — ED Notes (Signed)
Called lab x 2 about blood specimens not in process yet, despite having called earlier to have them added. Per lab staff, they have been received in and will process them.

## 2022-08-16 NOTE — Discharge Instructions (Addendum)
Discontinue taking your Plavix/clopidogrel and aspirin.  You will take Eliquis in place of this for atrial fibrillation.  You will get a phone call to follow-up about your atrial fibrillation with cardiology team.  You are now on a anticoagulant medication that puts you at risk for bleeding.  If you have any falls or you hit your head you should always consider come in for evaluation.  Please follow-up with your primary care doctor as well.  Information on my medicine - ELIQUIS (apixaban)  This medication education was reviewed with me or my healthcare representative as part of my discharge preparation.    Why was Eliquis prescribed for you? Eliquis was prescribed for you to reduce the risk of a blood clot forming that can cause a stroke if you have a medical condition called atrial fibrillation (a type of irregular heartbeat).  What do You need to know about Eliquis ? Take your Eliquis TWICE DAILY - one tablet in the morning and one tablet in the evening with or without food. If you have difficulty swallowing the tablet whole please discuss with your pharmacist how to take the medication safely.  Take Eliquis exactly as prescribed by your doctor and DO NOT stop taking Eliquis without talking to the doctor who prescribed the medication.  Stopping may increase your risk of developing a stroke.  Refill your prescription before you run out.  After discharge, you should have regular check-up appointments with your healthcare provider that is prescribing your Eliquis.  In the future your dose may need to be changed if your kidney function or weight changes by a significant amount or as you get older.  What do you do if you miss a dose? If you miss a dose, take it as soon as you remember on the same day and resume taking twice daily.  Do not take more than one dose of ELIQUIS at the same time to make up a missed dose.  Important Safety Information A possible side effect of Eliquis is bleeding.  You should call your healthcare provider right away if you experience any of the following: Bleeding from an injury or your nose that does not stop. Unusual colored urine (red or dark brown) or unusual colored stools (red or black). Unusual bruising for unknown reasons. A serious fall or if you hit your head (even if there is no bleeding).  Some medicines may interact with Eliquis and might increase your risk of bleeding or clotting while on Eliquis. To help avoid this, consult your healthcare provider or pharmacist prior to using any new prescription or non-prescription medications, including herbals, vitamins, non-steroidal anti-inflammatory drugs (NSAIDs) and supplements.  This website has more information on Eliquis (apixaban): http://www.eliquis.com/eliquis/home

## 2022-08-16 NOTE — ED Notes (Signed)
Changed pt's gown and blankets

## 2022-08-23 ENCOUNTER — Encounter (HOSPITAL_COMMUNITY): Payer: Self-pay | Admitting: Nurse Practitioner

## 2022-08-23 ENCOUNTER — Ambulatory Visit (HOSPITAL_COMMUNITY)
Admission: RE | Admit: 2022-08-23 | Discharge: 2022-08-23 | Disposition: A | Discharge status: Home / Self Care | Payer: 59 | Source: Ambulatory Visit | Attending: Nurse Practitioner | Admitting: Nurse Practitioner

## 2022-08-23 VITALS — BP 174/106 | HR 85 | Ht 68.0 in | Wt 229.8 lb

## 2022-08-23 DIAGNOSIS — Z7901 Long term (current) use of anticoagulants: Secondary | ICD-10-CM | POA: Diagnosis not present

## 2022-08-23 DIAGNOSIS — Z79899 Other long term (current) drug therapy: Secondary | ICD-10-CM | POA: Insufficient documentation

## 2022-08-23 DIAGNOSIS — Z794 Long term (current) use of insulin: Secondary | ICD-10-CM | POA: Diagnosis not present

## 2022-08-23 DIAGNOSIS — E785 Hyperlipidemia, unspecified: Secondary | ICD-10-CM | POA: Insufficient documentation

## 2022-08-23 DIAGNOSIS — I080 Rheumatic disorders of both mitral and aortic valves: Secondary | ICD-10-CM | POA: Diagnosis not present

## 2022-08-23 DIAGNOSIS — I4891 Unspecified atrial fibrillation: Secondary | ICD-10-CM | POA: Diagnosis present

## 2022-08-23 DIAGNOSIS — Z8673 Personal history of transient ischemic attack (TIA), and cerebral infarction without residual deficits: Secondary | ICD-10-CM | POA: Insufficient documentation

## 2022-08-23 DIAGNOSIS — I10 Essential (primary) hypertension: Secondary | ICD-10-CM | POA: Insufficient documentation

## 2022-08-23 DIAGNOSIS — D6869 Other thrombophilia: Secondary | ICD-10-CM

## 2022-08-23 NOTE — Progress Notes (Signed)
Primary Care Physician: [name], MD Referring Physician: ED f/u  [facility] pt as well    Stephen Flynn is a 89 y.o. male with a h/o CVA with right sided weakness, dysarthria, diastolic HF, HTN, HLD,  who presented to ED 08/16/22 with concern for fall out of a rolling chair( fell forward while reaching for something) as a Level 2 trauma for head trauma on anti plt meds, asa/plavix for prior stroke. Head CT did not show  any acute or traumatic change. EKG showed afib. Pt was by himself and he was not sure as to onset so this was initially to be thought it was new onset. However, it was documented in a prior note in December from  [place] that afib was found and pt deferred anticoagulation at that point. THis  was started in the ED and asa and plavix were stopped. He was rate controlled without rate control meds on board. He was referred to the afib clinic.  He is in rate controlled afib today. He states he is not aware of the afib. He is taking eliquis 5 mg bid. He states when he took his first pill of eliquis, within just 15 mins he was seeing a fly in the wall that his wife did not see. He states after taking each eliquis pill he will see that "fly" and he will go away but to return on the next dose. He does not have any other visual issues or other nero symptoms.   Today, he denies symptoms of palpitations, chest pain, shortness of breath, orthopnea, PND, lower extremity edema, dizziness, presyncope, syncope, or neurologic sequela. The patient is tolerating medications without difficulties and is otherwise without complaint today.   Past Medical History:  Diagnosis Date   Anemia    Aortic insufficiency    Chest pain    H/O: GI bleed    Hyperlipidemia    Hypertension    Leg cramps    Stroke [facility])    Past Surgical History:  Procedure Laterality Date   CARDIOVASCULAR STRESS TEST  09/04/2005   US ECHOCARDIOGRAPHY  11/26/2007   ef 55-60%    Current Outpatient Medications  Medication  Sig Dispense Refill   acetaminophen (TYLENOL) 500 MG tablet TAKE ONE TABLET BY MOUTH FOUR TIMES A DAY AS NEEDED - DISCONTINUE ADVIL     amLODipine (NORVASC) 5 MG tablet Take 1 tablet by mouth daily.     apixaban (ELIQUIS) 5 MG TABS tablet Take 1 tablet (5 mg total) by mouth 2 (two) times daily. 60 tablet 0   atorvastatin (LIPITOR) 80 MG tablet Take 1 tablet po q daily     Carboxymethylcellulose Sodium 0.25 % SOLN INSTILL 1 DROP IN BOTH EYES TWICE A DAY     diclofenac Sodium (VOLTAREN) 1 % GEL APPLY 2 GRAMS TO AFFECTED AREA TWICE A DAY AS NEEDED     ferrous sulfate 325 (65 FE) MG tablet TAKE ONE TABLET BY MOUTH MONDAY, WEDNESDAY AND FRIDAY TAKE WITH VITAMIN C OR A GLASS OF ORANGE JUICE     furosemide (LASIX) 20 MG tablet Take 1 tablet by mouth daily.     gabapentin (NEURONTIN) 100 MG capsule Take 1 capsule by mouth 2 (two) times daily.     hydrALAZINE (APRESOLINE) 25 MG tablet Take 1 tablet by mouth 2 (two) times daily.     ibuprofen (ADVIL) 200 MG tablet Take 400 mg by mouth every 6 (six) hours as needed for fever, headache or moderate pain.  metFORMIN (GLUCOPHAGE) 500 MG tablet TAKE ONE TABLET BY MOUTH TWICE A DAY FOR DIABETES (ANNUAL KIDNEY FUNCTION TESTING IS NEEDED)     Polyethyl Glycol-Propyl Glycol (SYSTANE FREE OP) Place 1 drop into both eyes daily as needed (dry eyes).      polyethylene glycol powder (GLYCOLAX/MIRALAX) 17 GM/SCOOP powder TAKE 17 GRAMS BY MOUTH TWICE A DAY (MIX WITH WATER OR OTHER LIQUID AS INSTRUCTED BEFORE DRINKING) FOR CONSTIPATION     senna-docusate (SENOKOT-S) 8.6-50 MG tablet TAKE 2 TABLETS BY MOUTH AT BEDTIME FOR CONSTIPATION     tamsulosin (FLOMAX) 0.4 MG CAPS capsule Take 0.4 mg by mouth.     No current facility-administered medications for this encounter.    Allergies  Allergen Reactions   Penicillin G     Other reaction(s): Drowsy   Penicillins Hives    Did it involve swelling of the face/tongue/throat, SOB, or low BP? No Did it involve sudden or  severe rash/hives, skin peeling, or any reaction on the inside of your mouth or nose? yes Did you need to seek medical attention at a hospital or doctor's office? yes When did it last happen?  child     If all above answers are "NO", may proceed with cephalosporin use.    Social History   Socioeconomic History   Marital status: Married    Spouse name: Not on file   Number of children: Not on file   Years of education: Not on file   Highest education level: Not on file  Occupational History   Not on file  Tobacco Use   Smoking status: Former   Smokeless tobacco: Never  Substance and Sexual Activity   Alcohol use: No   Drug use: No   Sexual activity: Not on file  Other Topics Concern   Not on file  Social History Narrative   Not on file   Social Determinants of Health   Financial Resource Strain: Not on file  Food Insecurity: Not on file  Transportation Needs: No Transportation Needs (04/02/2019)   PRAPARE - Hydrologist (Medical): No    Lack of Transportation (Non-Medical): No  Physical Activity: Not on file  Stress: Not on file  Social Connections: Not on file  Intimate Partner Violence: Not on file    No family history on file.  ROS- All systems are reviewed and negative except as per the HPI above  Physical Exam: There were no vitals filed for this visit. Wt Readings from Last 3 Encounters:  08/16/22 112 kg  03/10/20 112.3 kg  09/03/19 111.4 kg    Labs: Lab Results  Component Value Date   NA 138 08/16/2022   K 3.7 08/16/2022   CL 105 08/16/2022   CO2 25 08/16/2022   GLUCOSE 108 (H) 08/16/2022   BUN 14 08/16/2022   CREATININE 0.92 08/16/2022   CALCIUM 9.5 08/16/2022   MG 1.9 08/16/2022   Lab Results  Component Value Date   INR 1.1 04/27/2019   Lab Results  Component Value Date   CHOL 116 03/22/2019   HDL 40 (L) 03/22/2019   LDLCALC 65 03/22/2019   TRIG 55 03/22/2019     GEN- The patient is well appearing,  alert and oriented x 3 today.   Head- normocephalic, atraumatic Eyes-  Sclera clear, conjunctiva pink Ears- hearing intact Oropharynx- clear Neck- supple, no JVP Lymph- no cervical lymphadenopathy Lungs- Clear to ausculation bilaterally, normal work of breathing Heart- Regular rate and rhythm, no murmurs, rubs or  gallops, PMI not laterally displaced GI- soft, NT, ND, + BS Extremities- no clubbing, cyanosis, or edema MS- no significant deformity or atrophy Skin- no rash or lesion Psych- euthymic mood, full affect Neuro- strength and sensation are intact  EKG-Vent. rate 85 BPM PR interval * ms QRS duration 98 ms QT/QTcB 368/437 ms P-R-T axes * -44 108 Atrial fibrillation with a competing junctional pacemaker Left axis deviation Abnormal QRS-T angle, consider primary T wave abnormality Abnormal ECG When compared with ECG of 16-Aug-2022 16:38, PREVIOUS ECG IS PRESENT  Echo- 1. Left ventricular ejection fraction, by visual estimation, is 55 to  60%. The left ventricle has normal function. Normal left ventricular size.  There is mildly increased left ventricular hypertrophy.   2. Elevated mean left atrial pressure.   3. Left ventricular diastolic Doppler parameters are consistent with  impaired relaxation pattern of LV diastolic filling.   4. Global right ventricle has normal systolic function.The right  ventricular size is normal. No increase in right ventricular wall  thickness.   5. Left atrial size was mild-moderately dilated.   6. Right atrial size was normal.   7. The mitral valve is grossly normal. Mild mitral valve regurgitation.   8. The tricuspid valve is grossly normal. Tricuspid valve regurgitation  is trivial.   9. The aortic valve is tricuspid Aortic valve regurgitation is mild by  color flow Doppler. Mild aortic valve sclerosis without stenosis.  10. The pulmonic valve was grossly normal. Pulmonic valve regurgitation is  trivial by color flow Doppler.  11.  Aortic dilatation noted.  12. There is mild dilatation of the aortic root.  13. The inferior vena cava is normal in size with greater than 50%  respiratory variability, suggesting right atrial pressure of 3 mmHg.     Assessment and Plan:  1. Afib  Apparently present since December of 2023 Rate controlled with no rate control meds on board Pt is asymptomatic  2. CHA2DS2VASc  score of of at least 7 Continue eliquis 5 mg bid  Bleeding precautions discussed He says he has had hallucinations of a fly on the wall intermittently since starting eliquis  No other visual symptoms or neuro symptoms  I do not think this is from the eliquis as he said it started within 15 mins of taking first pill  I will make PCP aware thru my note and if worsens, he was told to see his ophthalmologist  He has stopped asa and plavix  F/u in clinic in 2 weeks, will obtain a bmet/cbc at that time  I do not think I will pursue cardioversion  to restore SR at at this point as he is asymptomatic and with advanced age   Geroge Baseman. Lillis Nuttle, [facility] [address] [place], [place] [address] [phone]

## 2022-08-29 ENCOUNTER — Encounter: Payer: Self-pay | Admitting: Podiatry

## 2022-08-29 ENCOUNTER — Ambulatory Visit (INDEPENDENT_AMBULATORY_CARE_PROVIDER_SITE_OTHER): Payer: 59 | Admitting: Podiatry

## 2022-08-29 VITALS — BP 160/77

## 2022-08-29 DIAGNOSIS — E1142 Type 2 diabetes mellitus with diabetic polyneuropathy: Secondary | ICD-10-CM | POA: Diagnosis not present

## 2022-08-29 DIAGNOSIS — E119 Type 2 diabetes mellitus without complications: Secondary | ICD-10-CM

## 2022-08-29 DIAGNOSIS — M79674 Pain in right toe(s): Secondary | ICD-10-CM

## 2022-08-29 DIAGNOSIS — M2011 Hallux valgus (acquired), right foot: Secondary | ICD-10-CM | POA: Diagnosis not present

## 2022-08-29 DIAGNOSIS — M2142 Flat foot [pes planus] (acquired), left foot: Secondary | ICD-10-CM

## 2022-08-29 DIAGNOSIS — M2012 Hallux valgus (acquired), left foot: Secondary | ICD-10-CM

## 2022-08-29 DIAGNOSIS — M2141 Flat foot [pes planus] (acquired), right foot: Secondary | ICD-10-CM | POA: Diagnosis not present

## 2022-08-29 DIAGNOSIS — M79675 Pain in left toe(s): Secondary | ICD-10-CM

## 2022-08-29 DIAGNOSIS — M1611 Unilateral primary osteoarthritis, right hip: Secondary | ICD-10-CM | POA: Insufficient documentation

## 2022-08-29 DIAGNOSIS — I4819 Other persistent atrial fibrillation: Secondary | ICD-10-CM | POA: Insufficient documentation

## 2022-08-29 DIAGNOSIS — B351 Tinea unguium: Secondary | ICD-10-CM | POA: Diagnosis not present

## 2022-08-29 DIAGNOSIS — Z0189 Encounter for other specified special examinations: Secondary | ICD-10-CM

## 2022-08-29 DIAGNOSIS — R600 Localized edema: Secondary | ICD-10-CM | POA: Insufficient documentation

## 2022-08-29 DIAGNOSIS — I4891 Unspecified atrial fibrillation: Secondary | ICD-10-CM | POA: Insufficient documentation

## 2022-08-31 NOTE — Progress Notes (Signed)
ANNUAL DIABETIC FOOT EXAM  Subjective: Stephen Flynn presents today for annual diabetic foot examination.  Chief Complaint  Patient presents with   Nail Problem    DFC [id number] A1C-do not know PCP-[name] PCP VST-08/28/2022    Patient confirms h/o diabetes.  Patient denies any h/o foot wounds.  Patient denies any numbness, tingling, burning, or pins/needle sensation in feet.  Risk factors: diabetes, neuropathy, h/o CVA, HTN, CHF, hyperlipidemia, h/o tobacco use in remission.  [name], MD is patient's PCP.   Past Medical History:  Diagnosis Date   Anemia    Aortic insufficiency    Chest pain    H/O: GI bleed    Hyperlipidemia    Hypertension    Leg cramps    Stroke [facility])    Patient Active Problem List   Diagnosis Date Noted   Lower extremity edema 08/29/2022   Atrial fibrillation ([place]) 08/29/2022   Unilateral primary osteoarthritis, right hip 08/29/2022   Edema 10/18/2021   Adult failure to thrive syndrome 07/15/2021   Advanced age [name]   Diastolic dysfunction [name]   Encounter for general adult medical examination without abnormal findings 07/15/2021   Chronic diastolic heart failure ([name]) 09/15/2020   H/O: stroke 09/15/2020   Other dyspnea and respiratory abnormality 09/15/2020   Venous insufficiency of leg 09/15/2020   Pain of right thigh 09/15/2020   Paronychia of great toe, right 12/09/2019   Pain due to onychomycosis of toenails of both feet 05/28/2019   Neuropathy 05/28/2019   Abnormal gait 03/31/2019   Aphasia 03/31/2019   Late effects of cerebrovascular disease 03/31/2019   Thoracic aortic aneurysm without rupture ([name]) 03/31/2019   Hardening of the aorta (main artery of the heart) ([name]) 03/31/2019   Ischemic stroke ([name]) 03/21/2019   Essential hypertension 03/21/2019   BPH (benign prostatic hyperplasia) 03/21/2019   Cataract 08/05/2018   Disequilibrium 10/24/2017   Constipation 07/27/2016   Heart failure ([name]) 03/31/2016    Malignant tumor of prostate ([name]) 01/25/2016   Hypertensive pulmonary arterial disease ([name]) 07/31/2013   Morbid obesity ([name]) 07/15/2013   Type 2 diabetes mellitus without complications ([name]) [id number]   Sebaceous cyst 07/11/2012   Underimmunization status 04/11/2012   Aortic aneurysm ([name]) 04/19/2009   Hyperlipidemia 04/19/2009   Iron deficiency anemia 04/19/2009   UNSPECIFIED ANEMIA 07/25/2007   G E R D 07/25/2007   HIATAL HERNIA 07/25/2007   HEMORRHOIDS, INTERNAL 05/28/2007   UNS GASTRITIS&GASTRODUODITIS W/O MENTION HEMORR 05/28/2007   DUODENITIS WITHOUT MENTION OF HEMORRHAGE 05/28/2007   Past Surgical History:  Procedure Laterality Date   CARDIOVASCULAR STRESS TEST  09/04/2005   US ECHOCARDIOGRAPHY  11/26/2007   ef 55-60%   Current Outpatient Medications on File Prior to Visit  Medication Sig Dispense Refill   acetaminophen (TYLENOL) 500 MG tablet TAKE ONE TABLET BY MOUTH FOUR TIMES A DAY AS NEEDED - DISCONTINUE ADVIL     amLODipine (NORVASC) 5 MG tablet Take 1 tablet by mouth daily.     apixaban (ELIQUIS) 5 MG TABS tablet Take 1 tablet (5 mg total) by mouth 2 (two) times daily. 60 tablet 0   atorvastatin (LIPITOR) 80 MG tablet Take 1 tablet po q daily     Carboxymethylcellulose Sodium 0.25 % SOLN INSTILL 1 DROP IN BOTH EYES TWICE A DAY     diclofenac Sodium (VOLTAREN) 1 % GEL APPLY 2 GRAMS TO AFFECTED AREA TWICE A DAY AS NEEDED     ferrous sulfate 325 (65 FE) MG tablet TAKE ONE TABLET BY MOUTH MONDAY, WEDNESDAY AND  FRIDAY TAKE WITH VITAMIN C OR A GLASS OF ORANGE JUICE     furosemide (LASIX) 20 MG tablet Take 1 tablet by mouth daily.     gabapentin (NEURONTIN) 100 MG capsule Take 1 capsule by mouth 2 (two) times daily.     hydrALAZINE (APRESOLINE) 25 MG tablet Take 1 tablet by mouth 2 (two) times daily.     metFORMIN (GLUCOPHAGE) 500 MG tablet TAKE ONE TABLET BY MOUTH TWICE A DAY FOR DIABETES (ANNUAL KIDNEY FUNCTION TESTING IS NEEDED)     Polyethyl Glycol-Propyl Glycol (SYSTANE  FREE OP) Place 1 drop into both eyes daily as needed (dry eyes).      polyethylene glycol powder (GLYCOLAX/MIRALAX) 17 GM/SCOOP powder TAKE 17 GRAMS BY MOUTH TWICE A DAY (MIX WITH WATER OR OTHER LIQUID AS INSTRUCTED BEFORE DRINKING) FOR CONSTIPATION     senna-docusate (SENOKOT-S) 8.6-50 MG tablet TAKE 2 TABLETS BY MOUTH AT BEDTIME FOR CONSTIPATION     tamsulosin (FLOMAX) 0.4 MG CAPS capsule Take 0.4 mg by mouth.     No current facility-administered medications on file prior to visit.    Allergies  Allergen Reactions   Penicillin G     Other reaction(s): Drowsy   Penicillins Hives    Did it involve swelling of the face/tongue/throat, SOB, or low BP? No Did it involve sudden or severe rash/hives, skin peeling, or any reaction on the inside of your mouth or nose? yes Did you need to seek medical attention at a hospital or doctor's office? yes When did it last happen?  child     If all above answers are "NO", may proceed with cephalosporin use.   Social History   Occupational History   Not on file  Tobacco Use   Smoking status: Former   Smokeless tobacco: Never  Substance and Sexual Activity   Alcohol use: No   Drug use: No   Sexual activity: Not on file   History reviewed. No pertinent family history. Immunization History  Administered Date(s) Administered   Fluad Quad(high Dose 65+) 05/20/2020   Influenza, High Dose Seasonal PF 03/03/2017   Influenza,inj,Quad PF,6+ Mos 07/02/2018   Influenza-Unspecified 04/02/2012, 04/07/2013, 04/17/2019   PFIZER(Purple Top)SARS-COV-2 Vaccination 08/26/2019, 09/16/2019, 05/02/2020   Pneumococcal Conjugate-13 04/20/2015   Pneumococcal Polysaccharide-23 05/08/2013   Pneumococcal-Unspecified 04/02/2009   Tdap 01/31/2009   Zoster Recombinat (Shingrix) 11/13/2019   Zoster, Live 05/08/2013     Review of Systems: Negative except as noted in the HPI.   Objective: Vitals:   08/29/22 1500  BP: (!) 160/77    Zimere Coro is a pleasant 89  y.o. male in NAD. AAO X 3.  Vascular Examination: Capillary refill time immediate b/l. Vascular status intact b/l with palpable pedal pulses. Pedal hair absent b/l. No edema. No pain with calf compression b/l. Skin temperature gradient WNL b/l.   Neurological Examination: Sensation grossly intact b/l with 10 gram monofilament. Vibratory sensation intact b/l.   Dermatological Examination: Pedal skin with normal turgor, texture and tone b/l.  No open wounds. No interdigital macerations.   Toenails 1-5 b/l thick, discolored, elongated with subungual debris and pain on dorsal palpation.   No hyperkeratotic nor porokeratotic lesions present on today's visit.  Musculoskeletal Examination: Muscle strength 5/5 to all lower extremity muscle groups bilaterally. Pes planus deformity noted bilateral LE.  Radiographs: None  Footwear Assessment: Does the patient wear appropriate shoes? Yes. Does the patient need inserts/orthotics? No.  Lab Results  Component Value Date   HGBA1C 7.4 (H) 03/22/2019   No  results found. ADA Risk Categorization: Low Risk :  Patient has all of the following: Intact protective sensation No prior foot ulcer  No severe deformity Pedal pulses present  Assessment: 1. Pain due to onychomycosis of toenails of both feet   2. Pes planus of both feet   3. Hallux valgus, acquired, bilateral   4. Diabetic peripheral neuropathy associated with type 2 diabetes mellitus ([place])   5. Encounter for diabetic foot exam [facility])     Plan: -Patient was evaluated and treated. All patient's and/or POA's questions/concerns answered on today's visit. -Diabetic foot examination performed today. -Continue foot and shoe inspections daily. Monitor blood glucose per PCP/Endocrinologist's recommendations. -Patient/POA to call should there be question/concern in the interim. Return in about 10 weeks (around 11/07/2022).  Marzetta Board, DPM

## 2022-09-05 ENCOUNTER — Ambulatory Visit (HOSPITAL_COMMUNITY)
Admission: RE | Admit: 2022-09-05 | Discharge: 2022-09-05 | Disposition: A | Discharge status: Home / Self Care | Payer: 59 | Source: Ambulatory Visit | Attending: Nurse Practitioner | Admitting: Nurse Practitioner

## 2022-09-05 VITALS — BP 194/130 | HR 94 | Ht 68.0 in | Wt 229.2 lb

## 2022-09-05 DIAGNOSIS — Z79899 Other long term (current) drug therapy: Secondary | ICD-10-CM | POA: Diagnosis not present

## 2022-09-05 DIAGNOSIS — Z87891 Personal history of nicotine dependence: Secondary | ICD-10-CM | POA: Diagnosis not present

## 2022-09-05 DIAGNOSIS — Z8673 Personal history of transient ischemic attack (TIA), and cerebral infarction without residual deficits: Secondary | ICD-10-CM | POA: Diagnosis not present

## 2022-09-05 DIAGNOSIS — E785 Hyperlipidemia, unspecified: Secondary | ICD-10-CM | POA: Insufficient documentation

## 2022-09-05 DIAGNOSIS — D6869 Other thrombophilia: Secondary | ICD-10-CM | POA: Diagnosis not present

## 2022-09-05 DIAGNOSIS — I110 Hypertensive heart disease with heart failure: Secondary | ICD-10-CM | POA: Insufficient documentation

## 2022-09-05 DIAGNOSIS — Z7901 Long term (current) use of anticoagulants: Secondary | ICD-10-CM | POA: Insufficient documentation

## 2022-09-05 DIAGNOSIS — I4891 Unspecified atrial fibrillation: Secondary | ICD-10-CM | POA: Diagnosis not present

## 2022-09-05 DIAGNOSIS — I5030 Unspecified diastolic (congestive) heart failure: Secondary | ICD-10-CM | POA: Insufficient documentation

## 2022-09-05 LAB — CBC
HCT: 32.3 % — ABNORMAL LOW (ref 39.0–52.0)
Hemoglobin: 10.4 g/dL — ABNORMAL LOW (ref 13.0–17.0)
MCH: 30.7 pg (ref 26.0–34.0)
MCHC: 32.2 g/dL (ref 30.0–36.0)
MCV: 95.3 fL (ref 80.0–100.0)
Platelets: 258 10*3/uL (ref 150–400)
RBC: 3.39 MIL/uL — ABNORMAL LOW (ref 4.22–5.81)
RDW: 15.4 % (ref 11.5–15.5)
WBC: 3.9 10*3/uL — ABNORMAL LOW (ref 4.0–10.5)
nRBC: 0 % (ref 0.0–0.2)

## 2022-09-05 LAB — BASIC METABOLIC PANEL
Anion gap: 11 (ref 5–15)
BUN: 19 mg/dL (ref 8–23)
CO2: 25 mmol/L (ref 22–32)
Calcium: 9.6 mg/dL (ref 8.9–10.3)
Chloride: 104 mmol/L (ref 98–111)
Creatinine, Ser: 0.99 mg/dL (ref 0.61–1.24)
GFR, Estimated: >60 mL/min (ref >60)
Glucose, Bld: 117 mg/dL — ABNORMAL HIGH (ref 70–99)
Potassium: 3.9 mmol/L (ref 3.5–5.1)
Sodium: 140 mmol/L (ref 135–145)

## 2022-09-05 NOTE — Patient Instructions (Signed)
F/u with [facility] cardiology

## 2022-09-05 NOTE — Progress Notes (Signed)
Primary Care Physician: [name], MD Referring Physician: ED f/u  [facility] Cardiology: Christophe Louis, PA     Stephen Flynn is a 89 y.o. male with a h/o CVA with right sided weakness, dysarthria, diastolic HF, HTN, HLD,  who presented to ED 08/16/22 with concern for fall out of a rolling chair( fell forward while reaching for something) as a Level 2 trauma for head trauma on anti plt meds, asa/plavix for prior stroke. Head CT did not show  any acute or traumatic change. EKG showed afib. Pt was by himself and he was not sure as to onset so this was initially to be thought it was new onset. However, it was documented in a prior note in December from  [place] that afib was found and pt deferred anticoagulation at that point. This  was started in the ED and asa and plavix were stopped. He was rate controlled without rate control meds on board. He was referred to the afib clinic.  He is in rate controlled afib today. He states he is not aware of the afib. He is taking eliquis 5 mg bid. He states when he took his first pill of eliquis, within just 15 mins he was seeing a fly on the wall that his wife did not see. He states after taking each eliquis pill he will see that "fly" and he will go away but to return on the next dose. He does not have any other visual issues or other nero symptoms.   F/u in afib clinic, 09/05/22. He remains in rate controlled afib fib. He is asymptomatic. He feels like his usual baseline. He still sees " a fly on he wall " out of his rt periphery when he is awake.  [facility]. His BP is elevated today and he said he thought he was to stop most of his meds when  seen in the ED. I reviewed the note, he was to stop asa and Plavix but not BP meds. He is off ASA and Plavix.   Today, he denies symptoms of palpitations, chest pain, shortness of breath, orthopnea, PND, lower extremity edema, dizziness, presyncope, syncope, or neurologic  sequela. The patient is tolerating medications without difficulties and is otherwise without complaint today.   Past Medical History:  Diagnosis Date   Anemia    Aortic insufficiency    Chest pain    H/O: GI bleed    Hyperlipidemia    Hypertension    Leg cramps    Stroke [facility])    Past Surgical History:  Procedure Laterality Date   CARDIOVASCULAR STRESS TEST  09/04/2005   US ECHOCARDIOGRAPHY  11/26/2007   ef 55-60%    Current Outpatient Medications  Medication Sig Dispense Refill   acetaminophen (TYLENOL) 500 MG tablet TAKE ONE TABLET BY MOUTH FOUR TIMES A DAY AS NEEDED - DISCONTINUE ADVIL     apixaban (ELIQUIS) 5 MG TABS tablet Take 1 tablet (5 mg total) by mouth 2 (two) times daily. 60 tablet 0   atorvastatin (LIPITOR) 80 MG tablet Take 1 tablet po q daily     Carboxymethylcellulose Sodium 0.25 % SOLN INSTILL 1 DROP IN BOTH EYES TWICE A DAY     diclofenac Sodium (VOLTAREN) 1 % GEL APPLY 2 GRAMS TO AFFECTED AREA TWICE A DAY AS NEEDED     ferrous sulfate 325 (65 FE) MG tablet TAKE ONE TABLET BY MOUTH MONDAY, WEDNESDAY AND FRIDAY TAKE WITH VITAMIN  C OR A GLASS OF ORANGE JUICE     furosemide (LASIX) 20 MG tablet Take 1 tablet by mouth daily.     gabapentin (NEURONTIN) 100 MG capsule Take 1 capsule by mouth 2 (two) times daily.     hydrALAZINE (APRESOLINE) 25 MG tablet Take 1 tablet by mouth 2 (two) times daily.     metFORMIN (GLUCOPHAGE) 500 MG tablet TAKE ONE TABLET BY MOUTH TWICE A DAY FOR DIABETES (ANNUAL KIDNEY FUNCTION TESTING IS NEEDED)     Polyethyl Glycol-Propyl Glycol (SYSTANE FREE OP) Place 1 drop into both eyes daily as needed (dry eyes).      polyethylene glycol powder (GLYCOLAX/MIRALAX) 17 GM/SCOOP powder TAKE 17 GRAMS BY MOUTH TWICE A DAY (MIX WITH WATER OR OTHER LIQUID AS INSTRUCTED BEFORE DRINKING) FOR CONSTIPATION     senna-docusate (SENOKOT-S) 8.6-50 MG tablet TAKE 2 TABLETS BY MOUTH AT BEDTIME FOR CONSTIPATION     tamsulosin (FLOMAX) 0.4 MG CAPS capsule Take 0.4 mg by  mouth.     amLODipine (NORVASC) 5 MG tablet Take 1 tablet by mouth daily. (Patient not taking: Reported on 09/05/2022)     No current facility-administered medications for this encounter.    Allergies  Allergen Reactions   Penicillin G     Other reaction(s): Drowsy   Penicillins Hives    Did it involve swelling of the face/tongue/throat, SOB, or low BP? No Did it involve sudden or severe rash/hives, skin peeling, or any reaction on the inside of your mouth or nose? yes Did you need to seek medical attention at a hospital or doctor's office? yes When did it last happen?  child     If all above answers are "NO", may proceed with cephalosporin use.    Social History   Socioeconomic History   Marital status: Married    Spouse name: Not on file   Number of children: Not on file   Years of education: Not on file   Highest education level: Not on file  Occupational History   Not on file  Tobacco Use   Smoking status: Former   Smokeless tobacco: Never  Substance and Sexual Activity   Alcohol use: No   Drug use: No   Sexual activity: Not on file  Other Topics Concern   Not on file  Social History Narrative   Not on file   Social Determinants of Health   Financial Resource Strain: Not on file  Food Insecurity: Not on file  Transportation Needs: No Transportation Needs (04/02/2019)   PRAPARE - Hydrologist (Medical): No    Lack of Transportation (Non-Medical): No  Physical Activity: Not on file  Stress: Not on file  Social Connections: Not on file  Intimate Partner Violence: Not on file    No family history on file.  ROS- All systems are reviewed and negative except as per the HPI above  Physical Exam: Vitals:   09/05/22 1325  Weight: 104 kg  Height: '5\' 8"'$  (1.727 m)   Wt Readings from Last 3 Encounters:  09/05/22 104 kg  08/23/22 104.2 kg  08/16/22 112 kg    Labs: Lab Results  Component Value Date   NA 138 08/16/2022   K 3.7  08/16/2022   CL 105 08/16/2022   CO2 25 08/16/2022   GLUCOSE 108 (H) 08/16/2022   BUN 14 08/16/2022   CREATININE 0.92 08/16/2022   CALCIUM 9.5 08/16/2022   MG 1.9 08/16/2022   Lab Results  Component Value Date  INR 1.1 04/27/2019   Lab Results  Component Value Date   CHOL 116 03/22/2019   HDL 40 (L) 03/22/2019   LDLCALC 65 03/22/2019   TRIG 55 03/22/2019     GEN- The patient is well appearing, alert and oriented x 3 today.   Head- normocephalic, atraumatic Eyes-  Sclera clear, conjunctiva pink Ears- hearing intact Oropharynx- clear Neck- supple, no JVP Lymph- no cervical lymphadenopathy Lungs- Clear to ausculation bilaterally, normal work of breathing Heart- irregular rate and rhythm, no murmurs, rubs or gallops, PMI not laterally displaced GI- soft, NT, ND, + BS Extremities- no clubbing, cyanosis, or edema MS- no significant deformity or atrophy Skin- no rash or lesion Psych- euthymic mood, full affect Neuro- strength and sensation are intact  EKG- Vent. rate 94 BPM PR interval * ms QRS duration 98 ms QT/QTcB 364/455 ms P-R-T axes * -46 106 Atrial fibrillation Left anterior fascicular block Nonspecific T wave abnormality Abnormal ECG When compared with ECG of 23-Aug-2022 11:24, PREVIOUS ECG IS PRESENT  Echo- 1. Left ventricular ejection fraction, by visual estimation, is 55 to  60%. The left ventricle has normal function. Normal left ventricular size.  There is mildly increased left ventricular hypertrophy.   2. Elevated mean left atrial pressure.   3. Left ventricular diastolic Doppler parameters are consistent with  impaired relaxation pattern of LV diastolic filling.   4. Global right ventricle has normal systolic function.The right  ventricular size is normal. No increase in right ventricular wall  thickness.   5. Left atrial size was mild-moderately dilated.   6. Right atrial size was normal.   7. The mitral valve is grossly normal. Mild mitral  valve regurgitation.   8. The tricuspid valve is grossly normal. Tricuspid valve regurgitation  is trivial.   9. The aortic valve is tricuspid Aortic valve regurgitation is mild by  color flow Doppler. Mild aortic valve sclerosis without stenosis.  10. The pulmonic valve was grossly normal. Pulmonic valve regurgitation is  trivial by color flow Doppler.  11. Aortic dilatation noted.  12. There is mild dilatation of the aortic root.  13. The inferior vena cava is normal in size with greater than 50%  respiratory variability, suggesting right atrial pressure of 3 mmHg.     Assessment and Plan:  1. Afib  Apparently dx when seen in December of 2023 at [facility] cardiology clinic  Rate controlled with no rate control meds on board Pt is asymptomatic  2. CHA2DS2VASc  score of of at least 7 Continue eliquis 5 mg bid  Bleeding precautions discussed He says he sees a "fly" in his left peripheral vision  since he fell out of his w/c, mostly wneh awake [facility]  No other visual symptoms or neuro symptoms  He has stopped asa and plavix cbc/bmet pending   3. HTN Poorly controlled  I don't think he is taking his amlodipine and his hydralazine  I have asked him to resume these drugs   I have asked him to f/u with Christophe Louis, PA in [facility] cardiology  Afib clinic as needed   Geroge Baseman. Mila Homer [facility] [address] [place], [place] [address] [place] Hideo Googe, [facility] [address] [place], [place] [address] [phone]

## 2022-09-19 ENCOUNTER — Other Ambulatory Visit (HOSPITAL_COMMUNITY): Payer: Self-pay | Admitting: *Deleted

## 2022-09-19 MED ORDER — APIXABAN 5 MG PO TABS: 5.0000 mg | ORAL_TABLET | Freq: Two times a day (BID) | ORAL | 4 refills | Status: DC

## 2022-09-22 ENCOUNTER — Telehealth (HOSPITAL_COMMUNITY): Payer: Self-pay

## 2022-09-22 NOTE — Telephone Encounter (Signed)
Patient states he has been having diarrhea for 3-4 days. He wanted to know if diarrhea is a side effect of Eliquis. Patient informed diarrhea is not a side effect of this medication. He was told to not take his Senokot or his Miralax. Patient advised to try Imodium or Pepto Bismol to help with the diarrhea. If the diarrhea does not subside he was told to reach out to his pcp. Communicated with patient and patient verbalized understanding.

## 2022-12-15 ENCOUNTER — Emergency Department (HOSPITAL_COMMUNITY): Payer: 59

## 2022-12-15 ENCOUNTER — Emergency Department (HOSPITAL_COMMUNITY)
Admission: EM | Admit: 2022-12-15 | Discharge: 2022-12-15 | Disposition: A | Discharge status: Home / Self Care | Payer: 59 | Attending: Emergency Medicine | Admitting: Emergency Medicine

## 2022-12-15 DIAGNOSIS — M25551 Pain in right hip: Secondary | ICD-10-CM | POA: Insufficient documentation

## 2022-12-15 DIAGNOSIS — I119 Hypertensive heart disease without heart failure: Secondary | ICD-10-CM | POA: Insufficient documentation

## 2022-12-15 DIAGNOSIS — W01198A Fall on same level from slipping, tripping and stumbling with subsequent striking against other object, initial encounter: Secondary | ICD-10-CM | POA: Insufficient documentation

## 2022-12-15 DIAGNOSIS — I10 Essential (primary) hypertension: Secondary | ICD-10-CM | POA: Diagnosis not present

## 2022-12-15 DIAGNOSIS — W19XXXA Unspecified fall, initial encounter: Secondary | ICD-10-CM

## 2022-12-15 DIAGNOSIS — S43121A Dislocation of right acromioclavicular joint, 100%-200% displacement, initial encounter: Secondary | ICD-10-CM | POA: Insufficient documentation

## 2022-12-15 DIAGNOSIS — E785 Hyperlipidemia, unspecified: Secondary | ICD-10-CM | POA: Insufficient documentation

## 2022-12-15 DIAGNOSIS — S4991XA Unspecified injury of right shoulder and upper arm, initial encounter: Secondary | ICD-10-CM | POA: Diagnosis present

## 2022-12-15 DIAGNOSIS — S43101A Unspecified dislocation of right acromioclavicular joint, initial encounter: Secondary | ICD-10-CM

## 2022-12-15 DIAGNOSIS — Z87891 Personal history of nicotine dependence: Secondary | ICD-10-CM | POA: Insufficient documentation

## 2022-12-15 DIAGNOSIS — Z7901 Long term (current) use of anticoagulants: Secondary | ICD-10-CM | POA: Insufficient documentation

## 2022-12-15 DIAGNOSIS — S0990XA Unspecified injury of head, initial encounter: Secondary | ICD-10-CM | POA: Diagnosis not present

## 2022-12-15 LAB — I-STAT CHEM 8, ED
BUN: 25 mg/dL — ABNORMAL HIGH (ref 8–23)
Calcium, Ion: 1.25 mmol/L (ref 1.15–1.40)
Chloride: 105 mmol/L (ref 98–111)
Creatinine, Ser: 1 mg/dL (ref 0.61–1.24)
Glucose, Bld: 150 mg/dL — ABNORMAL HIGH (ref 70–99)
HCT: 31 % — ABNORMAL LOW (ref 39.0–52.0)
Hemoglobin: 10.5 g/dL — ABNORMAL LOW (ref 13.0–17.0)
Potassium: 3.7 mmol/L (ref 3.5–5.1)
Sodium: 141 mmol/L (ref 135–145)
TCO2: 26 mmol/L (ref 22–32)

## 2022-12-15 LAB — CBC
HCT: 30.6 % — ABNORMAL LOW (ref 39.0–52.0)
Hemoglobin: 9.8 g/dL — ABNORMAL LOW (ref 13.0–17.0)
MCH: 30.5 pg (ref 26.0–34.0)
MCHC: 32 g/dL (ref 30.0–36.0)
MCV: 95.3 fL (ref 80.0–100.0)
Platelets: 225 10*3/uL (ref 150–400)
RBC: 3.21 MIL/uL — ABNORMAL LOW (ref 4.22–5.81)
RDW: 15.5 % (ref 11.5–15.5)
WBC: 4.2 10*3/uL (ref 4.0–10.5)
nRBC: 0 % (ref 0.0–0.2)

## 2022-12-15 LAB — COMPREHENSIVE METABOLIC PANEL
ALT: 12 U/L (ref 0–44)
AST: 15 U/L (ref 15–41)
Albumin: 3.5 g/dL (ref 3.5–5.0)
Alkaline Phosphatase: 43 U/L (ref 38–126)
Anion gap: 9 (ref 5–15)
BUN: 25 mg/dL — ABNORMAL HIGH (ref 8–23)
CO2: 23 mmol/L (ref 22–32)
Calcium: 9.5 mg/dL (ref 8.9–10.3)
Chloride: 105 mmol/L (ref 98–111)
Creatinine, Ser: 1.05 mg/dL (ref 0.61–1.24)
GFR, Estimated: >60 mL/min (ref >60)
Glucose, Bld: 154 mg/dL — ABNORMAL HIGH (ref 70–99)
Potassium: 3.8 mmol/L (ref 3.5–5.1)
Sodium: 137 mmol/L (ref 135–145)
Total Bilirubin: 0.7 mg/dL (ref 0.3–1.2)
Total Protein: 6.5 g/dL (ref 6.5–8.1)

## 2022-12-15 LAB — ETHANOL: Alcohol, Ethyl (B): <10 mg/dL (ref <10)

## 2022-12-15 NOTE — ED Notes (Signed)
Pt ambulated with NT successfully. No distress noted.

## 2022-12-15 NOTE — Discharge Instructions (Addendum)
Stephen Flynn:  Thank you for allowing [place] to take care of you today.  We hope you begin feeling better soon.  To-Do: Please follow-up with your primary doctor. Call the number provided to set up an appointment with an orthopedic surgeon. Please return to the Emergency Department or call 911 if you experience chest pain, shortness of breath, severe pain, severe fever, altered mental status, or have any reason to think that you need emergency medical care.  Thank you again.  Hope you feel better soon.  Department of Emergency Medicine [facility]

## 2022-12-15 NOTE — Progress Notes (Signed)
Orthopedic Tech Progress Note Patient Details:  Stephen Flynn 1927-09-16 [id number]  Patient ID: [name], male   DOB: 06/03/1928, 89 y.o.   MRN: [id number] Level II; not currently needed. Darleen Crocker 12/15/2022, 7:23 PM

## 2022-12-15 NOTE — ED Notes (Signed)
Iv attempt x2; unsuccessful. Phlebotomy made aware and on the way to collect

## 2022-12-15 NOTE — ED Notes (Signed)
Pt A&OX4 wheeled out of ED via wheelchair. Pt and family verbalized understanding of d/c instructions and follow up care.

## 2022-12-15 NOTE — ED Provider Notes (Signed)
[place] EMERGENCY DEPARTMENT AT [facility] Provider Note   HPI: Stephen Flynn is a 89 year old male with a past medical history as below presenting today as a level 2 trauma after a fall.  He reports he was in his kitchen cooking when he believes he may have tripped over a rug before he fell and hit the back of his head.  He also endorses right shoulder pain.  He endorses some mild right hip pain.  He denies losing consciousness and has not had any nausea or vomiting.  He is on Eliquis.  EMS report he has been awake and alert with them.  He has not exhibited nausea or vomiting with them.  He has not acted confused.  They did not see any evidence of a hematoma and report he was vitally stable in transit.  The patient was level due to a fall on Eliquis.  Past Medical History:  Diagnosis Date   Anemia    Aortic insufficiency    Chest pain    H/O: GI bleed    Hyperlipidemia    Hypertension    Leg cramps    Stroke [facility])     Past Surgical History:  Procedure Laterality Date   CARDIOVASCULAR STRESS TEST  09/04/2005   US ECHOCARDIOGRAPHY  11/26/2007   ef 55-60%     Social History   Tobacco Use   Smoking status: Former   Smokeless tobacco: Never  Substance Use Topics   Alcohol use: No   Drug use: No      Review of Systems  A complete ROS was performed with pertinent positives/negatives noted in the HPI.   Vitals:   12/15/22 2030 12/15/22 2256  BP: (!) 155/98 (!) 151/131  Pulse: 82 76  Resp:  18  Temp:    SpO2: 99% 94%    Physical Exam Vitals and nursing note reviewed.  Constitutional:      General: He is not in acute distress.    Appearance: He is well-developed.  HENT:     Head: Normocephalic and atraumatic.  Eyes:     Conjunctiva/sclera: Conjunctivae normal.  Cardiovascular:     Rate and Rhythm: Normal rate and regular rhythm.     Pulses: Normal pulses.     Heart sounds: Normal heart sounds. No murmur heard.    No friction rub. No gallop.   Pulmonary:     Effort: Pulmonary effort is normal. No respiratory distress.     Breath sounds: Normal breath sounds. No stridor. No wheezing, rhonchi or rales.  Abdominal:     General: Abdomen is flat. There is no distension.     Palpations: Abdomen is soft.     Tenderness: There is no abdominal tenderness. There is no guarding or rebound.  Musculoskeletal:        General: No swelling.     Comments: Patient does not have midline C/T/L-spine tenderness/step-off/deformity.  He does not have anterior lateral chest wall tenderness palpation.  No abdominal tenderness palpation.  He does have right shoulder tenderness to palpation.  And mild right hip tenderness to palpation.  No lower extremity tenderness palpation and no upper extremity tenderness to palpation.  Skin:    General: Skin is warm and dry.     Capillary Refill: Capillary refill takes less than 2 seconds.  Neurological:     Mental Status: He is alert.  Psychiatric:        Mood and Affect: Mood normal.     Procedures  MDM:  Imaging/radiology results:  DG Humerus Right  Result Date: 12/15/2022 CLINICAL DATA:  Ground level fall EXAM: RIGHT HUMERUS - 2+ VIEW COMPARISON:  Right shoulder 12/15/2022 FINDINGS: There is no evidence of fracture or other focal bone lesions. Soft tissues are unremarkable. IMPRESSION: Negative. Electronically Signed   By: Burman Nieves M.D.   On: 12/15/2022 19:38   DG Femur Portable Min 2 Views Right  Result Date: 12/15/2022 CLINICAL DATA:  Ground level fall EXAM: RIGHT FEMUR PORTABLE 2 VIEW COMPARISON:  None Available. FINDINGS: Severe degenerative changes in the right hip with near complete acetabular joint space narrowing and bone on bone. Osteophyte formation and subcortical cysts on both sides of the joint. There is remodeling of the superior femoral head. This is likely due to severe degenerative change but could indicate chronic avascular necrosis. No evidence of acute fracture or dislocation.  Midshaft and distal femur appears intact. Diffuse soft tissue edema. IMPRESSION: Severe degenerative changes in the right hip. No acute bony abnormalities. Electronically Signed   By: Burman Nieves M.D.   On: 12/15/2022 19:37   CT CERVICAL SPINE WO CONTRAST  Result Date: 12/15/2022 CLINICAL DATA:  Poly trauma, blunt.  Level 2 fall on thinners. EXAM: CT CERVICAL SPINE WITHOUT CONTRAST TECHNIQUE: Multidetector CT imaging of the cervical spine was performed without intravenous contrast. Multiplanar CT image reconstructions were also generated. RADIATION DOSE REDUCTION: This exam was performed according to the departmental dose-optimization program which includes automated exposure control, adjustment of the mA and/or kV according to patient size and/or use of iterative reconstruction technique. COMPARISON:  08/16/2022 FINDINGS: Alignment: Cervicothoracic scoliosis convex towards the right. Reversal of the usual cervical lordosis without anterior subluxation. This is likely positional or degenerative and is unchanged since prior studies. Muscle spasm could have this appearance as well. Normal alignment of the posterior elements. Skull base and vertebrae: Skull base appears intact. No vertebral compression deformities. No focal bone lesion or bone destruction. Soft tissues and spinal canal: No prevertebral soft tissue swelling. No abnormal paraspinal soft tissue mass or infiltration. Subcutaneous lipoma in the upper neck at the right craniocervical junction. Disc levels: Degenerative changes throughout with narrowed interspaces and endplate osteophyte formation. Degenerative changes in the facet joints. Degenerative changes in the temporomandibular joints. Upper chest: Emphysematous changes in the lung apices. Patchy infiltrates possibly edema. Vascular calcifications. Secretions are demonstrated in the trachea. Other: None. IMPRESSION: 1. Nonspecific reversal of the usual cervical lordosis without interval  change. This is likely positional or degenerative but could indicate muscle spasm. 2. No acute displaced fractures are identified. 3. Diffuse degenerative changes. 4. Secretions in the trachea. Patchy infiltrates in the lung apices likely edema. Emphysematous changes in the lung apices. Vascular calcifications. Electronically Signed   By: Burman Nieves M.D.   On: 12/15/2022 19:36   CT HEAD WO CONTRAST  Result Date: 12/15/2022 CLINICAL DATA:  Head trauma, moderate to severe. Level 2 fall on thinners. No loss of consciousness. Previous stroke. EXAM: CT HEAD WITHOUT CONTRAST TECHNIQUE: Contiguous axial images were obtained from the base of the skull through the vertex without intravenous contrast. RADIATION DOSE REDUCTION: This exam was performed according to the departmental dose-optimization program which includes automated exposure control, adjustment of the mA and/or kV according to patient size and/or use of iterative reconstruction technique. COMPARISON:  05/16/2019 and 08/16/2022 FINDINGS: Brain: Diffuse cerebral atrophy. Ventricular dilatation consistent with central atrophy. Low-attenuation changes in the deep white matter consistent with small vessel ischemia. No abnormal extra-axial fluid collections. No mass effect or  midline shift. Gray-white matter junctions are distinct. Basal cisterns are not effaced. No acute intracranial hemorrhage. Vascular: Prominent intracranial arterial vascular calcifications. Skull: Normal. Negative for fracture or focal lesion. Sinuses/Orbits: Mucosal thickening in the paranasal sinuses. No acute air-fluid levels. Mastoid air cells are clear. Other: None. IMPRESSION: No acute intracranial abnormalities. Chronic atrophy and small vessel ischemic changes. Electronically Signed   By: Burman Nieves M.D.   On: 12/15/2022 19:30   DG Chest Port 1 View  Result Date: 12/15/2022 CLINICAL DATA:  Trauma EXAM: PORTABLE CHEST 1 VIEW COMPARISON:  None Available. FINDINGS:  Cardiomegaly. Mediastinal contours within normal limits. Aortic atherosclerosis. No confluent opacities or effusions. Widening of the right [facility] joint as seen on right shoulder series. This could be related to [facility] joint separation or distal clavicular resorption. IMPRESSION: Cardiomegaly.  No acute cardiopulmonary disease. Widening of the right [facility] joint could be related to [facility] joint separation or distal clavicular resorption. Electronically Signed   By: Charlett Nose M.D.   On: 12/15/2022 19:20   DG Shoulder Right Port  Result Date: 12/15/2022 CLINICAL DATA:  Fall EXAM: RIGHT SHOULDER - 1 VIEW COMPARISON:  None Available. FINDINGS: Degenerative changes in the right AC and glenohumeral joints. Subacromial and subclavicular spurring. There is widening of the [facility] joint which could reflect AC joint separation. No fracture. IMPRESSION: Widening of the right AC joint. This could reflect AC joint separation. No fracture. Electronically Signed   By: Charlett Nose M.D.   On: 12/15/2022 19:16   DG Pelvis Portable  Result Date: 12/15/2022 CLINICAL DATA:  Recent fall with pelvic pain, initial encounter EXAM: PORTABLE PELVIS 1-2 VIEWS COMPARISON:  None Available. FINDINGS: Pelvic ring appears intact. Significant degenerative changes of the right hip joint are noted with subchondral sclerosis and cyst formation as well as remodeling of the right femoral head. No soft tissue changes are seen. IMPRESSION: Degenerative changes particularly in the right hip joint. No acute abnormality noted. Electronically Signed   By: Alcide Clever M.D.   On: 12/15/2022 19:15         Lab results:  Results for orders placed or performed during the hospital encounter of 12/15/22 (from the past 24 hour(s))  Comprehensive metabolic panel     Status: Abnormal   Collection Time: 12/15/22  7:53 PM  Result Value Ref Range   Sodium 137 135 - 145 mmol/L   Potassium 3.8 3.5 - 5.1 mmol/L   Chloride 105 98 - 111 mmol/L   CO2 23 22 - 32 mmol/L    Glucose, Bld 154 (H) 70 - 99 mg/dL   BUN 25 (H) 8 - 23 mg/dL   Creatinine, Ser 1.61 0.61 - 1.24 mg/dL   Calcium 9.5 8.9 - 09.6 mg/dL   Total Protein 6.5 6.5 - 8.1 g/dL   Albumin 3.5 3.5 - 5.0 g/dL   AST 15 15 - 41 U/L   ALT 12 0 - 44 U/L   Alkaline Phosphatase 43 38 - 126 U/L   Total Bilirubin 0.7 0.3 - 1.2 mg/dL   GFR, Estimated >04 >54 mL/min   Anion gap 9 5 - 15  CBC     Status: Abnormal   Collection Time: 12/15/22  7:53 PM  Result Value Ref Range   WBC 4.2 4.0 - 10.5 K/uL   RBC 3.21 (L) 4.22 - 5.81 MIL/uL   Hemoglobin 9.8 (L) 13.0 - 17.0 g/dL   HCT 09.8 (L) 11.9 - 14.7 %   MCV 95.3 80.0 - 100.0 fL  MCH 30.5 26.0 - 34.0 pg   MCHC 32.0 30.0 - 36.0 g/dL   RDW 16.1 09.6 - 04.5 %   Platelets 225 150 - 400 K/uL   nRBC 0.0 0.0 - 0.2 %  Ethanol     Status: None   Collection Time: 12/15/22  7:53 PM  Result Value Ref Range   Alcohol, Ethyl (B) <10 <10 mg/dL  I-Stat Chem 8, ED     Status: Abnormal   Collection Time: 12/15/22  7:58 PM  Result Value Ref Range   Sodium 141 135 - 145 mmol/L   Potassium 3.7 3.5 - 5.1 mmol/L   Chloride 105 98 - 111 mmol/L   BUN 25 (H) 8 - 23 mg/dL   Creatinine, Ser 4.09 0.61 - 1.24 mg/dL   Glucose, Bld 811 (H) 70 - 99 mg/dL   Calcium, Ion 9.14 7.82 - 1.40 mmol/L   TCO2 26 22 - 32 mmol/L   Hemoglobin 10.5 (L) 13.0 - 17.0 g/dL   HCT 95.6 (L) 21.3 - 08.6 %    Medical decision making: -Vital signs stable. Patient afebrile, hemodynamically stable, and non-toxic appearing. -Patient's presentation is most consistent with acute presentation with potential threat to life or bodily function.. Stephen Flynn is a 89 y.o. male presenting to the emergency department as a level 2 trauma after a fall on blood thinners.  -Additional history obtained from EMS as above. -Per chart review, patient was seen on 08/16/2022 after mechanical fall.  He has a history of A-fib on Eliquis. -Patient is presenting after a mechanical fall.  He has an intact airway and  bilateral breath sounds on evaluation.  He is hemodynamically stable.  IV access was obtained and secondary exam performed as above.  X-rays of the right shoulder and right hip obtained due to pain to palpation in these areas.  CT scans of the head and C-spine obtained.  Also obtain screening chest x-ray and pelvic x-ray.  X-rays did not show evidence of any fractures.  X-ray of the right shoulder does show AC joint separation therefore patient placed in a sling.  He has neurovascular intact therefore we will proceed with orthopedic surgery follow-up.  Chest x-ray is unremarkable on my interpretation for acute traumatic abnormality.  Pelvic x-ray shows degenerative changes but is otherwise without fracture.  Patient has been able to ambulate therefore lower suspicion for occult fracture.  CT head is without acute intracranial normality and CT C-spine is without acute traumatic abnormality in the cervical spine.  CBC is without acute hematologic abnormality and CMP is unremarkable.  Given reassuring workup I believe the patient is stable for discharge at this time.  I discussed strict return precautions for ED return.  Discussed follow-up with orthopedic surgery and PCP.  Patient discharged.   Medical Decision Making Amount and/or Complexity of Data Reviewed Labs: ordered. Decision-making details documented in ED Course. Radiology: ordered and independent interpretation performed. Decision-making details documented in ED Course.     The plan for this patient was discussed with Dr. Silverio Lay, who voiced agreement and who oversaw evaluation and treatment of this patient.  Marta Lamas, MD Emergency Medicine, PGY-3  Note: Dragon medical dictation software was used in the creation of this note.   Clinical Impression:  1. Fall, initial encounter   2. Separation of right acromioclavicular joint, initial encounter     Rx / DC Orders ED Discharge Orders     None          Chase Caller,  MD 12/16/22 [id number]    Charlynne Pander, MD 12/16/22 [phone]

## 2022-12-15 NOTE — ED Notes (Signed)
Pt was given a urinal.

## 2022-12-15 NOTE — ED Triage Notes (Signed)
PT BIB [facility] for a fall on thinners (Eliquis).  PT was cooking in a kitchen and had a mechanical fall.  No LOC. A&O x4. Pt has slurred speech at baseline from previous stroke.   Complaining of right shoulder, humeral pain and hip pain.  A little tenderness to right posterior head.   EMS VS 154/98 93% RA, HR 74, CBG 170

## 2022-12-27 ENCOUNTER — Ambulatory Visit (INDEPENDENT_AMBULATORY_CARE_PROVIDER_SITE_OTHER): Payer: 59 | Admitting: Podiatry

## 2022-12-27 ENCOUNTER — Encounter: Payer: Self-pay | Admitting: Podiatry

## 2022-12-27 VITALS — BP 154/95

## 2022-12-27 DIAGNOSIS — E1142 Type 2 diabetes mellitus with diabetic polyneuropathy: Secondary | ICD-10-CM

## 2022-12-27 DIAGNOSIS — B351 Tinea unguium: Secondary | ICD-10-CM

## 2022-12-27 DIAGNOSIS — M79675 Pain in left toe(s): Secondary | ICD-10-CM | POA: Diagnosis not present

## 2022-12-27 DIAGNOSIS — M79674 Pain in right toe(s): Secondary | ICD-10-CM

## 2022-12-31 NOTE — Progress Notes (Signed)
  Subjective:  Patient ID: Stephen Flynn, male    DOB: 1928/05/26,  MRN: [id number]  Stephen Flynn presents to clinic today for: at risk foot care with history of diabetic neuropathy and painful elongated mycotic toenails 1-5 bilaterally which are tender when wearing enclosed shoe gear. Pain is relieved with periodic professional debridement.  Chief Complaint  Patient presents with   Nail Problem    RFC,referring Provider Creola Corn, MD,lov:06/24       PCP is Creola Corn, MD.  Allergies  Allergen Reactions   Penicillin G     Other reaction(s): Drowsy   Penicillins Hives    Did it involve swelling of the face/tongue/throat, SOB, or low BP? No Did it involve sudden or severe rash/hives, skin peeling, or any reaction on the inside of your mouth or nose? yes Did you need to seek medical attention at a hospital or doctor's office? yes When did it last happen?  child     If all above answers are "NO", may proceed with cephalosporin use.    Review of Systems: Negative except as noted in the HPI.  Objective: No changes noted in today's physical examination. Vitals:   12/27/22 1546  BP: (!) 154/95    Stephen Flynn is a pleasant 89 y.o. male in NAD. AAO x 3.  Vascular Examination: Capillary refill time <3 seconds b/l LE. Palpable pedal pulses b/l LE. Digital hair present b/l. No pedal edema b/l. Skin temperature gradient WNL b/l. No varicosities b/l. No ischemia or gangrene noted b/l LE. No cyanosis or clubbing noted b/l LE.[name] Kitchen  Dermatological Examination: Pedal skin with normal turgor, texture and tone b/l. No open wounds. No interdigital macerations b/l. Toenails 1-5 b/l thickened, discolored, dystrophic with subungual debris. There is pain on palpation to dorsal aspect of nailplates. .  Neurological Examination: Protective sensation intact with 10 gram monofilament b/l LE. Vibratory sensation intact b/l LE. Pt has subjective symptoms of neuropathy.  Musculoskeletal  Examination: Muscle strength 5/5 to all lower extremity muscle groups bilaterally. HAV with bunion deformity noted b/l LE. Pes planus deformity noted bilateral LE. Utilizes walker for ambulation assistance.  Assessment/Plan: 1. Pain due to onychomycosis of toenails of both feet   2. Diabetic peripheral neuropathy associated with type 2 diabetes mellitus (HCC)    -Consent given for treatment as described below: -Examined patient. -Continue foot and shoe inspections daily. Monitor blood glucose per PCP/Endocrinologist's recommendations. -Patient to continue soft, supportive shoe gear daily. -Toenails 1-5 b/l were debrided in length and girth with sterile nail nippers and dremel without iatrogenic bleeding.  -Patient/POA to call should there be question/concern in the interim.   Return in about 3 months (around 03/29/2023).  Freddie Breech, DPM

## 2023-01-05 ENCOUNTER — Encounter (HOSPITAL_COMMUNITY): Payer: Self-pay

## 2023-01-05 ENCOUNTER — Emergency Department (HOSPITAL_COMMUNITY): Payer: 59

## 2023-01-05 ENCOUNTER — Emergency Department (HOSPITAL_COMMUNITY)
Admission: EM | Admit: 2023-01-05 | Discharge: 2023-01-05 | Disposition: A | Discharge status: Home / Self Care | Payer: 59 | Attending: Emergency Medicine | Admitting: Emergency Medicine

## 2023-01-05 DIAGNOSIS — I5030 Unspecified diastolic (congestive) heart failure: Secondary | ICD-10-CM | POA: Diagnosis not present

## 2023-01-05 DIAGNOSIS — Z7901 Long term (current) use of anticoagulants: Secondary | ICD-10-CM | POA: Diagnosis not present

## 2023-01-05 DIAGNOSIS — E119 Type 2 diabetes mellitus without complications: Secondary | ICD-10-CM | POA: Diagnosis not present

## 2023-01-05 DIAGNOSIS — K117 Disturbances of salivary secretion: Secondary | ICD-10-CM | POA: Insufficient documentation

## 2023-01-05 DIAGNOSIS — Z8673 Personal history of transient ischemic attack (TIA), and cerebral infarction without residual deficits: Secondary | ICD-10-CM | POA: Diagnosis not present

## 2023-01-05 DIAGNOSIS — Z79899 Other long term (current) drug therapy: Secondary | ICD-10-CM | POA: Insufficient documentation

## 2023-01-05 DIAGNOSIS — I110 Hypertensive heart disease with heart failure: Secondary | ICD-10-CM | POA: Diagnosis not present

## 2023-01-05 DIAGNOSIS — Z7984 Long term (current) use of oral hypoglycemic drugs: Secondary | ICD-10-CM | POA: Diagnosis not present

## 2023-01-05 LAB — COMPREHENSIVE METABOLIC PANEL
ALT: 13 U/L (ref 0–44)
AST: 14 U/L — ABNORMAL LOW (ref 15–41)
Albumin: 3.6 g/dL (ref 3.5–5.0)
Alkaline Phosphatase: 49 U/L (ref 38–126)
Anion gap: 10 (ref 5–15)
BUN: 11 mg/dL (ref 8–23)
CO2: 25 mmol/L (ref 22–32)
Calcium: 9.3 mg/dL (ref 8.9–10.3)
Chloride: 102 mmol/L (ref 98–111)
Creatinine, Ser: 1.09 mg/dL (ref 0.61–1.24)
GFR, Estimated: >60 mL/min (ref >60)
Glucose, Bld: 106 mg/dL — ABNORMAL HIGH (ref 70–99)
Potassium: 3.8 mmol/L (ref 3.5–5.1)
Sodium: 137 mmol/L (ref 135–145)
Total Bilirubin: 0.8 mg/dL (ref 0.3–1.2)
Total Protein: 6.6 g/dL (ref 6.5–8.1)

## 2023-01-05 LAB — DIFFERENTIAL
Abs Immature Granulocytes: 0.01 10*3/uL (ref 0.00–0.07)
Basophils Absolute: 0 10*3/uL (ref 0.0–0.1)
Basophils Relative: 1 %
Eosinophils Absolute: 0.2 10*3/uL (ref 0.0–0.5)
Eosinophils Relative: 5 %
Immature Granulocytes: 0 %
Lymphocytes Relative: 46 %
Lymphs Abs: 1.8 10*3/uL (ref 0.7–4.0)
Monocytes Absolute: 0.3 10*3/uL (ref 0.1–1.0)
Monocytes Relative: 6 %
Neutro Abs: 1.7 10*3/uL (ref 1.7–7.7)
Neutrophils Relative %: 42 %

## 2023-01-05 LAB — CBC
HCT: 32.1 % — ABNORMAL LOW (ref 39.0–52.0)
Hemoglobin: 10.3 g/dL — ABNORMAL LOW (ref 13.0–17.0)
MCH: 30.9 pg (ref 26.0–34.0)
MCHC: 32.1 g/dL (ref 30.0–36.0)
MCV: 96.4 fL (ref 80.0–100.0)
Platelets: 204 10*3/uL (ref 150–400)
RBC: 3.33 MIL/uL — ABNORMAL LOW (ref 4.22–5.81)
RDW: 15.5 % (ref 11.5–15.5)
WBC: 3.9 10*3/uL — ABNORMAL LOW (ref 4.0–10.5)
nRBC: 0 % (ref 0.0–0.2)

## 2023-01-05 LAB — ETHANOL: Alcohol, Ethyl (B): <10 mg/dL (ref <10)

## 2023-01-05 LAB — APTT: aPTT: 44 seconds — ABNORMAL HIGH (ref 24–36)

## 2023-01-05 LAB — PROTIME-INR
INR: 1.2 (ref 0.8–1.2)
Prothrombin Time: 15.6 seconds — ABNORMAL HIGH (ref 11.4–15.2)

## 2023-01-05 MED ORDER — SODIUM CHLORIDE 0.9% FLUSH: 3.0000 mL | Freq: Once | INTRAVENOUS | Status: DC

## 2023-01-05 NOTE — ED Triage Notes (Addendum)
Hx of stroke with right sided weakness, slurred speech and facial droop. Stopped taking Eliquis last month. Here for increased drooling x 1 week per patient. No new neuro deficits. Alert and oriented x 4.

## 2023-01-05 NOTE — ED Provider Notes (Signed)
[place] EMERGENCY DEPARTMENT AT [facility] Provider Note   CSN: [id number] Arrival date & time: 01/05/23  1347     History  Chief Complaint  Patient presents with   Weakness    Stephen Flynn is a 89 y.o. male with history of HTN, aortic insufficiency, HLD, stroke (2020, with residual R sided weakness, slurred speech and facial droop), diastolic HF, T2DM, afib on eliquis who presents to the ER complaining of 1-2 weeks of increased drooling. Reports drool is coming out both sides of his mouth all day, with no aggravating or alleviating factors. Sometimes he doesn't notice the drool until it hits his shirt. Not complaining of any pain or difficulty swallowing. Patient states he called his PCP at the [place] today to ask them to prescribe him something for the drooling he has been experiencing, they began asking him questions and he states they told him to come to the ER to rule out a stroke. Family reports he has had 3 falls within the past few months. Patient reports stopping his eliquis about a month ago due to diarrhea.   Weakness Associated symptoms: drooling        Home Medications Prior to Admission medications   Medication Sig Start Date End Date Taking? Authorizing Provider  acetaminophen (TYLENOL) 500 MG tablet TAKE ONE TABLET BY MOUTH FOUR TIMES A DAY AS NEEDED - DISCONTINUE ADVIL 11/11/20   [provider]  amLODipine (NORVASC) 5 MG tablet Take 1 tablet by mouth daily.    [provider]  apixaban (ELIQUIS) 5 MG TABS tablet Take 1 tablet (5 mg total) by mouth 2 (two) times daily. 09/19/22 10/19/22  Newman Nip, NP  atorvastatin (LIPITOR) 80 MG tablet Take 1 tablet po q daily    [provider]  Carboxymethylcellulose Sodium 0.25 % SOLN INSTILL 1 DROP IN BOTH EYES TWICE A DAY 08/13/20   [provider]  diclofenac Sodium (VOLTAREN) 1 % GEL APPLY 2 GRAMS TO AFFECTED AREA TWICE A DAY AS NEEDED 05/20/20   [provider]   ferrous sulfate 325 (65 FE) MG tablet TAKE ONE TABLET BY MOUTH MONDAY, WEDNESDAY AND FRIDAY TAKE WITH VITAMIN C OR A GLASS OF ORANGE JUICE 05/25/20   [provider]  furosemide (LASIX) 20 MG tablet Take 1 tablet by mouth daily. 07/23/20   [provider]  gabapentin (NEURONTIN) 100 MG capsule Take 1 capsule by mouth 2 (two) times daily. 07/14/21   [provider]  hydrALAZINE (APRESOLINE) 25 MG tablet Take 1 tablet by mouth 2 (two) times daily.    [provider]  metFORMIN (GLUCOPHAGE) 500 MG tablet TAKE ONE TABLET BY MOUTH TWICE A DAY FOR DIABETES (ANNUAL KIDNEY FUNCTION TESTING IS NEEDED) 07/14/21   [provider]  Polyethyl Glycol-Propyl Glycol (SYSTANE FREE OP) Place 1 drop into both eyes daily as needed (dry eyes).     [provider]  polyethylene glycol powder (GLYCOLAX/MIRALAX) 17 GM/SCOOP powder TAKE 17 GRAMS BY MOUTH TWICE A DAY (MIX WITH WATER OR OTHER LIQUID AS INSTRUCTED BEFORE DRINKING) FOR CONSTIPATION 07/23/20   [provider]  senna-docusate (SENOKOT-S) 8.6-50 MG tablet TAKE 2 TABLETS BY MOUTH AT BEDTIME FOR CONSTIPATION 07/23/20   [provider]  tamsulosin (FLOMAX) 0.4 MG CAPS capsule Take 0.4 mg by mouth.    [provider]      Allergies    Penicillin g and Penicillins    Review of Systems   Review of Systems  HENT:  Positive  for drooling.   All other systems reviewed and are negative.   Physical Exam Updated Vital Signs Temp 98.1 F (36.7 C) (Oral)   Ht 5\' 9"  (1.753 m)   Wt 104.3 kg   BMI 33.97 kg/m  Physical Exam Vitals and nursing note reviewed.  Constitutional:      Appearance: Normal appearance.  HENT:     Head: Normocephalic and atraumatic.  Eyes:     Conjunctiva/sclera: Conjunctivae normal.  Cardiovascular:     Rate and Rhythm: Normal rate and regular rhythm.  Pulmonary:     Effort: Pulmonary effort is normal. No respiratory distress.     Breath sounds: Normal breath  sounds.  Abdominal:     General: There is no distension.     Palpations: Abdomen is soft.     Tenderness: There is no abdominal tenderness.  Skin:    General: Skin is warm and dry.  Neurological:     General: No focal deficit present.     Mental Status: He is alert.     Comments: Neuro: Speech is clear, able to follow commands. CN III-XII intact grossly intact, residual right mouth droop. PERRLA. EOMI. Sensation intact throughout. 4/5 strength right leg, 5/5 strength everywhere else. Abnormal gait.      ED Results / Procedures / Treatments   Labs (all labs ordered are listed, but only abnormal results are displayed) Labs Reviewed  PROTIME-INR - Abnormal; Notable for the following components:      Result Value   Prothrombin Time 15.6 (*)    All other components within normal limits  APTT - Abnormal; Notable for the following components:   aPTT 44 (*)    All other components within normal limits  CBC - Abnormal; Notable for the following components:   WBC 3.9 (*)    RBC 3.33 (*)    Hemoglobin 10.3 (*)    HCT 32.1 (*)    All other components within normal limits  COMPREHENSIVE METABOLIC PANEL - Abnormal; Notable for the following components:   Glucose, Bld 106 (*)    AST 14 (*)    All other components within normal limits  DIFFERENTIAL  ETHANOL    EKG EKG Interpretation Date/Time:  Friday January 05 2023 14:03:46 EDT Ventricular Rate:  84 PR Interval:    QRS Duration:  110 QT Interval:  400 QTC Calculation: 473 R Axis:   -38  Text Interpretation: Atrial fibrillation Ventricular premature complex LVH with secondary repolarization abnormality No significant change since last tracing Confirmed by Gwyneth Sprout ([id number]) on 01/05/2023 5:32:32 PM  Radiology MR BRAIN WO CONTRAST  Result Date: 01/05/2023 CLINICAL DATA:  Acute neurologic deficit EXAM: MRI HEAD WITHOUT CONTRAST TECHNIQUE: Multiplanar, multiecho pulse sequences of the brain and surrounding structures were  obtained without intravenous contrast. COMPARISON:  04/27/2019 FINDINGS: Brain: No acute infarct, mass effect or extra-axial collection. No acute or chronic hemorrhage. There is multifocal hyperintense T2-weighted signal within the white matter. Generalized volume loss. Multiple old small vessel infarcts of the corona radiata. The midline structures are normal. Vascular: Major flow voids are preserved. Skull and upper cervical spine: Normal calvarium and skull base. Visualized upper cervical spine and soft tissues are normal. Sinuses/Orbits:Left mastoid effusion and bilateral maxillary sinus mucosal disease. Normal orbits. IMPRESSION: 1. No acute intracranial abnormality. 2. Multiple old small vessel infarcts of the corona radiata and findings of chronic small vessel ischemia. Electronically Signed   By: Deatra Robinson M.D.   On: 01/05/2023 18:59    Procedures Procedures  Medications Ordered in ED Medications  sodium chloride flush (NS) 0.9 % injection 3 mL (has no administration in time range)    ED Course/ Medical Decision Making/ A&P                             Medical Decision Making  This patient is a 89 y.o. male  who presents to the ED for concern of drooling x 1-2 weeks. Hx of stroke with residual deficits.    Differential diagnoses prior to evaluation: The emergent differential diagnosis includes, but is not limited to,  CVA. This is not an exhaustive differential.   Past Medical History / Co-morbidities / Social History: HTN, aortic insufficiency, HLD, stroke (2020, with residual R sided weakness, slurred speech and facial droop), diastolic HF, T2DM, afib not on anticoagulation  Physical Exam: Physical exam performed. The pertinent findings include: Residual right sided facial droop and slurred speech, no appreciable weakness, mild gait instability. Otherwise neurologic exam reassuring. No new focal deficits noted. No drooling on my exam.   Lab Tests/Imaging studies: I  personally interpreted labs/imaging and the pertinent results include:  CBC and CMP unremarkable.  MRI brain with evidence of multiple old small vessel infarcts but no acute intracranial abnormalities. I agree with the radiologist interpretation.  Cardiac monitoring: EKG obtained and interpreted by myself and attending physician which shows: atrial fibrillation, normal rate   Disposition: After consideration of the diagnostic results and the patients response to treatment, I feel that emergency department workup does not suggest an emergent condition requiring admission or immediate intervention beyond what has been performed at this time. The plan is: discharge to home with recommendation to follow up with PCP and ENT regarding excessive drooling. Very low concern for CVA with reassuring neurologic exam and MRI without acute abnormalities. The patient is safe for discharge and has been instructed to return immediately for worsening symptoms, change in symptoms or any other concerns.  I discussed this case with my attending physician Dr. Particia Nearing who cosigned this note including patient's presenting symptoms, physical exam, and planned diagnostics and interventions. Attending physician stated agreement with plan or made changes to plan which were implemented.   Final Clinical Impression(s) / ED Diagnoses Final diagnoses:  Drooling    Rx / DC Orders ED Discharge Orders     None      Portions of this report may have been transcribed using voice recognition software. Every effort was made to ensure accuracy; however, inadvertent computerized transcription errors may be present.    Jeanella Flattery 01/05/23 1925    Jacalyn Lefevre, MD 01/06/23 [phone]

## 2023-01-05 NOTE — Discharge Instructions (Addendum)
You were seen in the emergency department today after your primary care provider had concern about a possible stroke.  Your lab work and brain MRI today were reassuring. I do not believe your symptoms of drooling were caused by a stroke.   Excess drooling can be treated with some different medications that can be prescribed by either your primary doctor or an ENT (ear, nose, throat) specialist. I've attached contact information for a provider we work with if you would like to follow up.  Continue to monitor how you're doing and return to the ER for new or worsening symptoms.

## 2023-01-31 NOTE — Progress Notes (Signed)
UDS ordered as part of trauma order set

## 2023-02-25 ENCOUNTER — Encounter (HOSPITAL_COMMUNITY): Payer: Self-pay

## 2023-02-25 ENCOUNTER — Other Ambulatory Visit: Payer: Self-pay

## 2023-02-25 ENCOUNTER — Inpatient Hospital Stay (HOSPITAL_COMMUNITY): Payer: 59

## 2023-02-25 ENCOUNTER — Emergency Department (HOSPITAL_COMMUNITY): Payer: 59

## 2023-02-25 ENCOUNTER — Inpatient Hospital Stay (HOSPITAL_COMMUNITY)
Admission: EM | Admit: 2023-02-25 | Discharge: 2023-04-03 | DRG: 329 | Disposition: E | Payer: 59 | Attending: Internal Medicine | Admitting: Internal Medicine

## 2023-02-25 DIAGNOSIS — R6521 Severe sepsis with septic shock: Secondary | ICD-10-CM | POA: Diagnosis present

## 2023-02-25 DIAGNOSIS — K219 Gastro-esophageal reflux disease without esophagitis: Secondary | ICD-10-CM | POA: Diagnosis present

## 2023-02-25 DIAGNOSIS — E1165 Type 2 diabetes mellitus with hyperglycemia: Secondary | ICD-10-CM | POA: Diagnosis not present

## 2023-02-25 DIAGNOSIS — E870 Hyperosmolality and hypernatremia: Secondary | ICD-10-CM | POA: Diagnosis not present

## 2023-02-25 DIAGNOSIS — G931 Anoxic brain damage, not elsewhere classified: Secondary | ICD-10-CM | POA: Diagnosis not present

## 2023-02-25 DIAGNOSIS — K659 Peritonitis, unspecified: Secondary | ICD-10-CM | POA: Diagnosis present

## 2023-02-25 DIAGNOSIS — E8729 Other acidosis: Secondary | ICD-10-CM | POA: Diagnosis not present

## 2023-02-25 DIAGNOSIS — I4819 Other persistent atrial fibrillation: Secondary | ICD-10-CM | POA: Diagnosis not present

## 2023-02-25 DIAGNOSIS — J690 Pneumonitis due to inhalation of food and vomit: Secondary | ICD-10-CM | POA: Diagnosis present

## 2023-02-25 DIAGNOSIS — Z880 Allergy status to penicillin: Secondary | ICD-10-CM

## 2023-02-25 DIAGNOSIS — I429 Cardiomyopathy, unspecified: Secondary | ICD-10-CM | POA: Diagnosis present

## 2023-02-25 DIAGNOSIS — E785 Hyperlipidemia, unspecified: Secondary | ICD-10-CM | POA: Diagnosis present

## 2023-02-25 DIAGNOSIS — A419 Sepsis, unspecified organism: Secondary | ICD-10-CM | POA: Diagnosis present

## 2023-02-25 DIAGNOSIS — Z87891 Personal history of nicotine dependence: Secondary | ICD-10-CM | POA: Diagnosis not present

## 2023-02-25 DIAGNOSIS — I420 Dilated cardiomyopathy: Secondary | ICD-10-CM | POA: Diagnosis not present

## 2023-02-25 DIAGNOSIS — I4891 Unspecified atrial fibrillation: Secondary | ICD-10-CM | POA: Diagnosis not present

## 2023-02-25 DIAGNOSIS — E119 Type 2 diabetes mellitus without complications: Secondary | ICD-10-CM

## 2023-02-25 DIAGNOSIS — J9602 Acute respiratory failure with hypercapnia: Secondary | ICD-10-CM | POA: Diagnosis not present

## 2023-02-25 DIAGNOSIS — I468 Cardiac arrest due to other underlying condition: Secondary | ICD-10-CM | POA: Diagnosis not present

## 2023-02-25 DIAGNOSIS — J9600 Acute respiratory failure, unspecified whether with hypoxia or hypercapnia: Secondary | ICD-10-CM | POA: Diagnosis not present

## 2023-02-25 DIAGNOSIS — I509 Heart failure, unspecified: Secondary | ICD-10-CM | POA: Diagnosis not present

## 2023-02-25 DIAGNOSIS — G9341 Metabolic encephalopathy: Secondary | ICD-10-CM | POA: Diagnosis not present

## 2023-02-25 DIAGNOSIS — K55029 Acute infarction of small intestine, extent unspecified: Secondary | ICD-10-CM | POA: Diagnosis present

## 2023-02-25 DIAGNOSIS — I5032 Chronic diastolic (congestive) heart failure: Secondary | ICD-10-CM | POA: Diagnosis not present

## 2023-02-25 DIAGNOSIS — I351 Nonrheumatic aortic (valve) insufficiency: Secondary | ICD-10-CM | POA: Diagnosis present

## 2023-02-25 DIAGNOSIS — E860 Dehydration: Secondary | ICD-10-CM | POA: Diagnosis present

## 2023-02-25 DIAGNOSIS — Z66 Do not resuscitate: Secondary | ICD-10-CM | POA: Diagnosis not present

## 2023-02-25 DIAGNOSIS — N400 Enlarged prostate without lower urinary tract symptoms: Secondary | ICD-10-CM | POA: Diagnosis present

## 2023-02-25 DIAGNOSIS — D638 Anemia in other chronic diseases classified elsewhere: Secondary | ICD-10-CM | POA: Diagnosis present

## 2023-02-25 DIAGNOSIS — R609 Edema, unspecified: Secondary | ICD-10-CM | POA: Diagnosis not present

## 2023-02-25 DIAGNOSIS — J9601 Acute respiratory failure with hypoxia: Secondary | ICD-10-CM | POA: Diagnosis not present

## 2023-02-25 DIAGNOSIS — R54 Age-related physical debility: Secondary | ICD-10-CM | POA: Diagnosis present

## 2023-02-25 DIAGNOSIS — I7123 Aneurysm of the descending thoracic aorta, without rupture: Secondary | ICD-10-CM | POA: Diagnosis not present

## 2023-02-25 DIAGNOSIS — R338 Other retention of urine: Secondary | ICD-10-CM | POA: Diagnosis present

## 2023-02-25 DIAGNOSIS — I4821 Permanent atrial fibrillation: Secondary | ICD-10-CM | POA: Diagnosis present

## 2023-02-25 DIAGNOSIS — N179 Acute kidney failure, unspecified: Secondary | ICD-10-CM | POA: Diagnosis not present

## 2023-02-25 DIAGNOSIS — J9811 Atelectasis: Secondary | ICD-10-CM | POA: Diagnosis not present

## 2023-02-25 DIAGNOSIS — I482 Chronic atrial fibrillation, unspecified: Secondary | ICD-10-CM

## 2023-02-25 DIAGNOSIS — K5652 Intestinal adhesions [bands] with complete obstruction: Secondary | ICD-10-CM | POA: Diagnosis present

## 2023-02-25 DIAGNOSIS — E669 Obesity, unspecified: Secondary | ICD-10-CM | POA: Diagnosis present

## 2023-02-25 DIAGNOSIS — I5043 Acute on chronic combined systolic (congestive) and diastolic (congestive) heart failure: Secondary | ICD-10-CM | POA: Diagnosis not present

## 2023-02-25 DIAGNOSIS — E440 Moderate protein-calorie malnutrition: Secondary | ICD-10-CM | POA: Diagnosis present

## 2023-02-25 DIAGNOSIS — T17908A Unspecified foreign body in respiratory tract, part unspecified causing other injury, initial encounter: Secondary | ICD-10-CM

## 2023-02-25 DIAGNOSIS — T17908D Unspecified foreign body in respiratory tract, part unspecified causing other injury, subsequent encounter: Secondary | ICD-10-CM | POA: Diagnosis not present

## 2023-02-25 DIAGNOSIS — D509 Iron deficiency anemia, unspecified: Secondary | ICD-10-CM | POA: Diagnosis present

## 2023-02-25 DIAGNOSIS — T45516A Underdosing of anticoagulants, initial encounter: Secondary | ICD-10-CM | POA: Diagnosis present

## 2023-02-25 DIAGNOSIS — I110 Hypertensive heart disease with heart failure: Secondary | ICD-10-CM | POA: Diagnosis present

## 2023-02-25 DIAGNOSIS — E876 Hypokalemia: Secondary | ICD-10-CM | POA: Diagnosis present

## 2023-02-25 DIAGNOSIS — K56609 Unspecified intestinal obstruction, unspecified as to partial versus complete obstruction: Secondary | ICD-10-CM | POA: Diagnosis present

## 2023-02-25 DIAGNOSIS — J189 Pneumonia, unspecified organism: Secondary | ICD-10-CM | POA: Diagnosis present

## 2023-02-25 DIAGNOSIS — Z7189 Other specified counseling: Secondary | ICD-10-CM | POA: Diagnosis not present

## 2023-02-25 DIAGNOSIS — L8990 Pressure ulcer of unspecified site, unspecified stage: Secondary | ICD-10-CM | POA: Insufficient documentation

## 2023-02-25 DIAGNOSIS — R188 Other ascites: Secondary | ICD-10-CM | POA: Diagnosis present

## 2023-02-25 DIAGNOSIS — Z6836 Body mass index (BMI) 36.0-36.9, adult: Secondary | ICD-10-CM

## 2023-02-25 DIAGNOSIS — Z515 Encounter for palliative care: Secondary | ICD-10-CM | POA: Diagnosis not present

## 2023-02-25 DIAGNOSIS — I69351 Hemiplegia and hemiparesis following cerebral infarction affecting right dominant side: Secondary | ICD-10-CM | POA: Diagnosis not present

## 2023-02-25 DIAGNOSIS — I712 Thoracic aortic aneurysm, without rupture, unspecified: Secondary | ICD-10-CM | POA: Diagnosis present

## 2023-02-25 DIAGNOSIS — K567 Ileus, unspecified: Secondary | ICD-10-CM | POA: Diagnosis not present

## 2023-02-25 DIAGNOSIS — N401 Enlarged prostate with lower urinary tract symptoms: Secondary | ICD-10-CM | POA: Diagnosis present

## 2023-02-25 DIAGNOSIS — N5089 Other specified disorders of the male genital organs: Secondary | ICD-10-CM | POA: Diagnosis present

## 2023-02-25 DIAGNOSIS — M6250 Muscle wasting and atrophy, not elsewhere classified, unspecified site: Secondary | ICD-10-CM | POA: Diagnosis not present

## 2023-02-25 DIAGNOSIS — D62 Acute posthemorrhagic anemia: Secondary | ICD-10-CM | POA: Diagnosis not present

## 2023-02-25 DIAGNOSIS — Z7901 Long term (current) use of anticoagulants: Secondary | ICD-10-CM

## 2023-02-25 DIAGNOSIS — Z993 Dependence on wheelchair: Secondary | ICD-10-CM

## 2023-02-25 DIAGNOSIS — I480 Paroxysmal atrial fibrillation: Secondary | ICD-10-CM | POA: Diagnosis not present

## 2023-02-25 DIAGNOSIS — E872 Acidosis, unspecified: Secondary | ICD-10-CM | POA: Diagnosis not present

## 2023-02-25 DIAGNOSIS — R1010 Upper abdominal pain, unspecified: Principal | ICD-10-CM

## 2023-02-25 DIAGNOSIS — Z7984 Long term (current) use of oral hypoglycemic drugs: Secondary | ICD-10-CM

## 2023-02-25 DIAGNOSIS — M7989 Other specified soft tissue disorders: Secondary | ICD-10-CM | POA: Diagnosis not present

## 2023-02-25 DIAGNOSIS — Z79899 Other long term (current) drug therapy: Secondary | ICD-10-CM

## 2023-02-25 DIAGNOSIS — D6489 Other specified anemias: Secondary | ICD-10-CM | POA: Diagnosis not present

## 2023-02-25 LAB — COMPREHENSIVE METABOLIC PANEL
ALT: 11 U/L (ref 0–44)
AST: 14 U/L — ABNORMAL LOW (ref 15–41)
Albumin: 3.2 g/dL — ABNORMAL LOW (ref 3.5–5.0)
Alkaline Phosphatase: 44 U/L (ref 38–126)
Anion gap: 12 (ref 5–15)
BUN: 11 mg/dL (ref 8–23)
CO2: 23 mmol/L (ref 22–32)
Calcium: 9.3 mg/dL (ref 8.9–10.3)
Chloride: 104 mmol/L (ref 98–111)
Creatinine, Ser: 0.88 mg/dL (ref 0.61–1.24)
GFR, Estimated: >60 mL/min (ref >60)
Glucose, Bld: 160 mg/dL — ABNORMAL HIGH (ref 70–99)
Potassium: 3 mmol/L — ABNORMAL LOW (ref 3.5–5.1)
Sodium: 139 mmol/L (ref 135–145)
Total Bilirubin: 0.8 mg/dL (ref 0.3–1.2)
Total Protein: 7 g/dL (ref 6.5–8.1)

## 2023-02-25 LAB — CBC WITH DIFFERENTIAL/PLATELET
Abs Immature Granulocytes: 0.01 10*3/uL (ref 0.00–0.07)
Basophils Absolute: 0 10*3/uL (ref 0.0–0.1)
Basophils Relative: 1 %
Eosinophils Absolute: 0.2 10*3/uL (ref 0.0–0.5)
Eosinophils Relative: 3 %
HCT: 31 % — ABNORMAL LOW (ref 39.0–52.0)
Hemoglobin: 10 g/dL — ABNORMAL LOW (ref 13.0–17.0)
Immature Granulocytes: 0 %
Lymphocytes Relative: 32 %
Lymphs Abs: 1.7 10*3/uL (ref 0.7–4.0)
MCH: 31.5 pg (ref 26.0–34.0)
MCHC: 32.3 g/dL (ref 30.0–36.0)
MCV: 97.8 fL (ref 80.0–100.0)
Monocytes Absolute: 0.2 10*3/uL (ref 0.1–1.0)
Monocytes Relative: 4 %
Neutro Abs: 3.1 10*3/uL (ref 1.7–7.7)
Neutrophils Relative %: 60 %
Platelets: 304 10*3/uL (ref 150–400)
RBC: 3.17 MIL/uL — ABNORMAL LOW (ref 4.22–5.81)
RDW: 14.6 % (ref 11.5–15.5)
WBC: 5.2 10*3/uL (ref 4.0–10.5)
nRBC: 0 % (ref 0.0–0.2)

## 2023-02-25 LAB — I-STAT CHEM 8, ED
BUN: 11 mg/dL (ref 8–23)
Calcium, Ion: 1.16 mmol/L (ref 1.15–1.40)
Chloride: 104 mmol/L (ref 98–111)
Creatinine, Ser: 0.7 mg/dL (ref 0.61–1.24)
Glucose, Bld: 159 mg/dL — ABNORMAL HIGH (ref 70–99)
HCT: 32 % — ABNORMAL LOW (ref 39.0–52.0)
Hemoglobin: 10.9 g/dL — ABNORMAL LOW (ref 13.0–17.0)
Potassium: 3.1 mmol/L — ABNORMAL LOW (ref 3.5–5.1)
Sodium: 140 mmol/L (ref 135–145)
TCO2: 25 mmol/L (ref 22–32)

## 2023-02-25 LAB — LACTIC ACID, PLASMA
Lactic Acid, Venous: 2.6 mmol/L (ref 0.5–1.9)
Lactic Acid, Venous: 2.6 mmol/L (ref 0.5–1.9)

## 2023-02-25 LAB — MAGNESIUM: Magnesium: 1.8 mg/dL (ref 1.7–2.4)

## 2023-02-25 LAB — BRAIN NATRIURETIC PEPTIDE: B Natriuretic Peptide: 725 pg/mL — ABNORMAL HIGH (ref 0.0–100.0)

## 2023-02-25 LAB — GLUCOSE, CAPILLARY
Glucose-Capillary: 157 mg/dL — ABNORMAL HIGH (ref 70–99)
Glucose-Capillary: 175 mg/dL — ABNORMAL HIGH (ref 70–99)

## 2023-02-25 LAB — TROPONIN I (HIGH SENSITIVITY): Troponin I (High Sensitivity): 13 ng/L (ref <18)

## 2023-02-25 LAB — LIPASE, BLOOD: Lipase: 16 U/L (ref 11–51)

## 2023-02-25 MED ORDER — PANTOPRAZOLE SODIUM 40 MG IV SOLR
40.0000 mg | INTRAVENOUS | Status: DC
  Administered 2023-02-25 – 2023-03-11 (×15): 40 mg via INTRAVENOUS
  Filled 2023-02-25 (×15): qty 10

## 2023-02-25 MED ORDER — INSULIN ASPART 100 UNIT/ML IJ SOLN
0.0000 [IU] | INTRAMUSCULAR | Status: DC
  Administered 2023-02-25 – 2023-02-28 (×6): 1 [IU] via SUBCUTANEOUS

## 2023-02-25 MED ORDER — ONDANSETRON HCL 4 MG/2ML IJ SOLN
4.0000 mg | Freq: Four times a day (QID) | INTRAMUSCULAR | Status: DC | PRN
  Administered 2023-02-25 – 2023-03-23 (×2): 4 mg via INTRAVENOUS
  Filled 2023-02-25 (×2): qty 2

## 2023-02-25 MED ORDER — ONDANSETRON HCL 4 MG PO TABS: 4.0000 mg | ORAL_TABLET | Freq: Four times a day (QID) | ORAL | Status: DC | PRN

## 2023-02-25 MED ORDER — POTASSIUM CHLORIDE IN NACL 20-0.9 MEQ/L-% IV SOLN
INTRAVENOUS | Status: DC
  Filled 2023-02-25 (×3): qty 1000

## 2023-02-25 MED ORDER — ONDANSETRON HCL 4 MG/2ML IJ SOLN
4.0000 mg | Freq: Once | INTRAMUSCULAR | Status: AC
  Administered 2023-02-25: 4 mg via INTRAVENOUS
  Filled 2023-02-25: qty 2

## 2023-02-25 MED ORDER — SODIUM CHLORIDE 0.9 % IV SOLN: INTRAVENOUS | Status: DC

## 2023-02-25 MED ORDER — DILTIAZEM HCL-DEXTROSE 125-5 MG/125ML-% IV SOLN (PREMIX)
5.0000 mg/h | INTRAVENOUS | Status: DC
  Administered 2023-02-25: 5 mg/h via INTRAVENOUS
  Filled 2023-02-25: qty 125

## 2023-02-25 MED ORDER — IOHEXOL 350 MG/ML SOLN
75.0000 mL | Freq: Once | INTRAVENOUS | Status: AC | PRN
  Administered 2023-02-25: 75 mL via INTRAVENOUS

## 2023-02-25 MED ORDER — LABETALOL HCL 5 MG/ML IV SOLN: 10.0000 mg | INTRAVENOUS | Status: DC | PRN

## 2023-02-25 MED ORDER — POTASSIUM CHLORIDE 10 MEQ/100ML IV SOLN
10.0000 meq | INTRAVENOUS | Status: AC
  Administered 2023-02-25 (×4): 10 meq via INTRAVENOUS
  Filled 2023-02-25 (×4): qty 100

## 2023-02-25 MED ORDER — METOPROLOL TARTRATE 5 MG/5ML IV SOLN: 5.0000 mg | Freq: Four times a day (QID) | INTRAVENOUS | Status: DC

## 2023-02-25 MED ORDER — LACTATED RINGERS IV BOLUS
500.0000 mL | Freq: Once | INTRAVENOUS | Status: AC
  Administered 2023-02-25: 500 mL via INTRAVENOUS

## 2023-02-25 MED ORDER — LABETALOL HCL 5 MG/ML IV SOLN
10.0000 mg | INTRAVENOUS | Status: DC | PRN
  Filled 2023-02-25: qty 4

## 2023-02-25 MED ORDER — DIATRIZOATE MEGLUMINE & SODIUM 66-10 % PO SOLN
90.0000 mL | Freq: Once | ORAL | Status: AC
  Administered 2023-02-25: 90 mL via NASOGASTRIC
  Filled 2023-02-25: qty 90

## 2023-02-25 MED ORDER — HEPARIN (PORCINE) 25000 UT/250ML-% IV SOLN
1200.0000 [IU]/h | INTRAVENOUS | Status: DC
  Administered 2023-02-26: 1200 [IU]/h via INTRAVENOUS
  Filled 2023-02-25: qty 250

## 2023-02-25 MED ORDER — MORPHINE SULFATE (PF) 2 MG/ML IV SOLN
2.0000 mg | Freq: Once | INTRAVENOUS | Status: AC
  Administered 2023-02-25: 2 mg via INTRAVENOUS
  Filled 2023-02-25: qty 1

## 2023-02-25 MED ORDER — DILTIAZEM LOAD VIA INFUSION
15.0000 mg | Freq: Once | INTRAVENOUS | Status: AC
  Administered 2023-02-25: 15 mg via INTRAVENOUS
  Filled 2023-02-25: qty 15

## 2023-02-25 MED ORDER — HYDROMORPHONE HCL 1 MG/ML IJ SOLN
0.5000 mg | INTRAMUSCULAR | Status: DC | PRN
  Administered 2023-02-25 – 2023-02-27 (×4): 0.5 mg via INTRAVENOUS
  Filled 2023-02-25 (×5): qty 0.5

## 2023-02-25 MED ORDER — MAGNESIUM SULFATE 2 GM/50ML IV SOLN
2.0000 g | Freq: Once | INTRAVENOUS | Status: AC
  Administered 2023-02-25: 2 g via INTRAVENOUS
  Filled 2023-02-25: qty 50

## 2023-02-25 NOTE — Progress Notes (Signed)
ANTICOAGULATION CONSULT NOTE - Initial Consult  Pharmacy Consult for heparin Indication: atrial fibrillation  Allergies  Allergen Reactions   Penicillin G     Other reaction(s): Drowsy   Penicillins Hives    Did it involve swelling of the face/tongue/throat, SOB, or low BP? No Did it involve sudden or severe rash/hives, skin peeling, or any reaction on the inside of your mouth or nose? yes Did you need to seek medical attention at a hospital or doctor's office? yes When did it last happen?  child     If all above answers are "NO", may proceed with cephalosporin use.    Patient Measurements:   Heparin Dosing Weight: 89kg  Vital Signs: Temp: 97.5 F (36.4 C) (08/25 1436) Temp Source: Oral (08/25 1436) BP: 166/121 (08/25 1745) Pulse Rate: 135 (08/25 1745)  Labs: Recent Labs    02/25/23 1433 02/25/23 1459  HGB 10.0* 10.9*  HCT 31.0* 32.0*  PLT 304  --   CREATININE 0.88 0.70  TROPONINIHS 13  --     CrCl cannot be calculated (Unknown ideal weight.).   Medical History: Past Medical History:  Diagnosis Date   Anemia    Aortic insufficiency    Chest pain    H/O: GI bleed    Hyperlipidemia    Hypertension    Leg cramps    Stroke [facility])     Assessment: 89 YOM presenting with epigastric pain, on xarelto PTA for afib with last dose taken last week sometime per pt report (previously switched from Eliquis to Xarelto for GI issues, however pt reports having more issues and stopping Xarelto a few days ago which he attributed to the drug)  Goal of Therapy:  Heparin level 0.3-0.7 units/ml aPTT 66-102 seconds Monitor platelets by anticoagulation protocol: Yes   Plan:  Heparin gtt at 1200 units/hr, no bolus F/u 8 hour aPTT/HL F/u ability to transition back to PO  Daylene Posey, PharmD, [facility] Clinical Pharmacist ED Pharmacist Phone # [phone] 02/25/2023 6:47 PM

## 2023-02-25 NOTE — ED Notes (Signed)
This RN attempted to start another line to start the IV meds that were ordered at 1900. However,unsuccessful.   The meds that were pulled out down in the ED which including Cardizem and heparin were brought up to the floor with floating nurse Adam. Floor nurse made aware via secure chat.

## 2023-02-25 NOTE — ED Notes (Signed)
NT ambulated pt with pulse ox stating at 95

## 2023-02-25 NOTE — Consult Note (Addendum)
Reason for Consult: ab pain Referring Physician: Dr Manfred Arch is an 89 y.o. male.  HPI: 77 yom who states he has had no prior surgery and had sudden onset of ab pain (diffuse) this morning after breakfast.  Feels like he has it in his chest.  Some nausea no real emesis. Has had small bms.  No urinary symptoms.  He has not been eating normally for the past week.  He has undergone evaluation that shows nl WBC. On CT scan there is dilated stomach, small bowel and a transition point.  There is some free fluid and mesenteric edema on the ct .  There is no pneumatosis.  He has a lactic acid pending now.  On evlauation he does not really complain of ab pain right now and feels ok. He is getting ng when I see him and is tachycardic with this (he was not earlier).  Past Medical History:  Diagnosis Date   Anemia    Aortic insufficiency    Chest pain    H/O: GI bleed    Hyperlipidemia    Hypertension    Leg cramps    Stroke [facility])     Past Surgical History:  Procedure Laterality Date   CARDIOVASCULAR STRESS TEST  09/04/2005   US ECHOCARDIOGRAPHY  11/26/2007   ef 55-60%    History reviewed. No pertinent family history.  Social History:  reports that he has quit smoking. He has never used smokeless tobacco. He reports that he does not drink alcohol and does not use drugs.  Allergies:  Allergies  Allergen Reactions   Penicillin G     Other reaction(s): Drowsy   Penicillins Hives    Did it involve swelling of the face/tongue/throat, SOB, or low BP? No Did it involve sudden or severe rash/hives, skin peeling, or any reaction on the inside of your mouth or nose? yes Did you need to seek medical attention at a hospital or doctor's office? yes When did it last happen?  child     If all above answers are "NO", may proceed with cephalosporin use.    Medications: I have reviewed the patient's current medications.  Results for orders placed or performed during the hospital encounter of  02/25/23 (from the past 48 hour(s))  CBC with Differential     Status: Abnormal   Collection Time: 02/25/23  2:33 PM  Result Value Ref Range   WBC 5.2 4.0 - 10.5 K/uL   RBC 3.17 (L) 4.22 - 5.81 MIL/uL   Hemoglobin 10.0 (L) 13.0 - 17.0 g/dL   HCT 82.9 (L) 56.2 - 13.0 %   MCV 97.8 80.0 - 100.0 fL   MCH 31.5 26.0 - 34.0 pg   MCHC 32.3 30.0 - 36.0 g/dL   RDW 86.5 78.4 - 69.6 %   Platelets 304 150 - 400 K/uL   nRBC 0.0 0.0 - 0.2 %   Neutrophils Relative % 60 %   Neutro Abs 3.1 1.7 - 7.7 K/uL   Lymphocytes Relative 32 %   Lymphs Abs 1.7 0.7 - 4.0 K/uL   Monocytes Relative 4 %   Monocytes Absolute 0.2 0.1 - 1.0 K/uL   Eosinophils Relative 3 %   Eosinophils Absolute 0.2 0.0 - 0.5 K/uL   Basophils Relative 1 %   Basophils Absolute 0.0 0.0 - 0.1 K/uL   Immature Granulocytes 0 %   Abs Immature Granulocytes 0.01 0.00 - 0.07 K/uL    Comment: Performed at [facility], [address], [place], [place] [address]  Comprehensive metabolic panel     Status: Abnormal   Collection Time: 02/25/23  2:33 PM  Result Value Ref Range   Sodium 139 135 - 145 mmol/L   Potassium 3.0 (L) 3.5 - 5.1 mmol/L   Chloride 104 98 - 111 mmol/L   CO2 23 22 - 32 mmol/L   Glucose, Bld 160 (H) 70 - 99 mg/dL    Comment: Glucose reference range applies only to samples taken after fasting for at least 8 hours.   BUN 11 8 - 23 mg/dL   Creatinine, Ser 6.64 0.61 - 1.24 mg/dL   Calcium 9.3 8.9 - 40.3 mg/dL   Total Protein 7.0 6.5 - 8.1 g/dL   Albumin 3.2 (L) 3.5 - 5.0 g/dL   AST 14 (L) 15 - 41 U/L   ALT 11 0 - 44 U/L   Alkaline Phosphatase 44 38 - 126 U/L   Total Bilirubin 0.8 0.3 - 1.2 mg/dL   GFR, Estimated >47 >42 mL/min    Comment: (NOTE) Calculated using the CKD-EPI Creatinine Equation (2021)    Anion gap 12 5 - 15    Comment: Performed at [facility], [address] [place], [place] [address]  Lipase, blood     Status: None   Collection Time: 02/25/23  2:33 PM  Result Value Ref Range   Lipase  16 11 - 51 U/L    Comment: Performed at [facility], [address], [place], [place] [address]  Troponin I (High Sensitivity)     Status: None   Collection Time: 02/25/23  2:33 PM  Result Value Ref Range   Troponin I (High Sensitivity) 13 <18 ng/L    Comment: (NOTE) Elevated high sensitivity troponin I (hsTnI) values and significant  changes across serial measurements may suggest ACS but many other  chronic and acute conditions are known to elevate hsTnI results.  Refer to the "Links" section for chest pain algorithms and additional  guidance. Performed at [facility], [address], [place], [place] [address]   Brain natriuretic peptide     Status: Abnormal   Collection Time: 02/25/23  2:33 PM  Result Value Ref Range   B Natriuretic Peptide 725.0 (H) 0.0 - 100.0 pg/mL    Comment: Performed at [facility], [address] [place], [place] [address]  Magnesium     Status: None   Collection Time: 02/25/23  2:33 PM  Result Value Ref Range   Magnesium 1.8 1.7 - 2.4 mg/dL    Comment: Performed at [facility], [address] [place], [place] [address]  I-stat chem 8, ED (not at [facility], [facility] or [facility])     Status: Abnormal   Collection Time: 02/25/23  2:59 PM  Result Value Ref Range   Sodium 140 135 - 145 mmol/L   Potassium 3.1 (L) 3.5 - 5.1 mmol/L   Chloride 104 98 - 111 mmol/L   BUN 11 8 - 23 mg/dL   Creatinine, Ser 0.16 0.61 - 1.24 mg/dL   Glucose, Bld 010 (H) 70 - 99 mg/dL    Comment: Glucose reference range applies only to samples taken after fasting for at least 8 hours.   Calcium, Ion 1.16 1.15 - 1.40 mmol/L   TCO2 25 22 - 32 mmol/L   Hemoglobin 10.9 (L) 13.0 - 17.0 g/dL   HCT 93.2 (L) 35.5 - 73.2 %    CT ABDOMEN PELVIS W CONTRAST  Result Date: 02/25/2023 CLINICAL DATA:  Acute abdominal pain. EXAM: CT ABDOMEN AND PELVIS WITH CONTRAST TECHNIQUE: Multidetector CT imaging of the abdomen and pelvis was performed using the standard protocol following  bolus administration of intravenous contrast. RADIATION DOSE REDUCTION: This exam was performed according to the departmental dose-optimization program which includes automated exposure control, adjustment of the mA and/or kV according to patient size and/or use of iterative reconstruction technique. CONTRAST:  75mL OMNIPAQUE IOHEXOL 350 MG/ML SOLN COMPARISON:  CT 06/02/2019 FINDINGS: Lower chest: The heart is enlarged.  Patulous distal esophagus. Hepatobiliary: Scattered subcentimeter hypodense liver lesions are stable from 2020 exam. No evidence of new liver lesion. Gallbladder physiologically distended, no calcified stone. No biliary dilatation. Pancreas: Pancreatic atrophy.  No ductal dilatation or inflammation. Spleen: Normal in size without focal abnormality. Adrenals/Urinary Tract: No adrenal nodule. No hydronephrosis. Simple cyst arises from the upper pole of the right kidney. Smaller simple cyst in the upper left kidney. No further follow-up imaging is recommended. No renal calculi or suspicious renal lesion. Bladder is physiologically distended. Equivocal bladder wall thickening. Stomach/Bowel: Dilated fluid-filled stomach. Dilated fluid-filled small bowel in the left mid abdomen. Transition point in the central abdomen series 3, image 45. The more distal small bowel is decompressed. There is prominent mesenteric edema and free fluid. No bowel pneumatosis. Small to moderate volume stool in the colon. The sigmoid colon is redundant. The appendix is not definitively seen. Vascular/Lymphatic: Aortic atherosclerosis. No aneurysm. Portal vein is patent. No portal venous or mesenteric gas. Calcified nodule in the left small bowel mesentery may represent a calcified lymph node. Reproductive: Enlarged prostate gland causing mass effect on the bladder base. Other: Moderate abdominopelvic ascites. Mesenteric edema primarily in the left abdomen. Small amount of ascites in the left inguinal canal. No free air. Left  flank subcutaneous lipoma. Musculoskeletal: Advanced right hip arthropathy. Diffuse lumbar degenerative change. There are no acute or suspicious osseous abnormalities. IMPRESSION: 1. Small-bowel obstruction with transition point in the central abdomen. There is prominent mesenteric edema and free fluid. The degree of mesenteric inflammation can predispose to ischemia. Recommend correlation with lactate. 2. Moderate abdominopelvic ascites. 3. Enlarged prostate gland causing mass effect on the bladder base. Equivocal bladder wall thickening. Aortic Atherosclerosis (ICD10-I70.0). Electronically Signed   By: Narda Rutherford M.D.   On: 02/25/2023 16:56   DG Chest Port 1 View  Result Date: 02/25/2023 CLINICAL DATA:  Acute onset chest pain 1 hour ago. Shortness of breath. EXAM: PORTABLE CHEST 1 VIEW COMPARISON:  12/15/2022 FINDINGS: Stable cardiomegaly and ectasia of thoracic aorta. Low lung volumes noted, however both lungs are clear. IMPRESSION: Low lung volumes. No active lung disease. Electronically Signed   By: Danae Orleans M.D.   On: 02/25/2023 15:31    Review of Systems  Gastrointestinal:  Positive for abdominal pain.  All other systems reviewed and are negative.  Blood pressure (!) 166/121, pulse (!) 135, temperature (!) 97.5 F (36.4 C), temperature source Oral, resp. rate 20, SpO2 96%. Physical Exam Vitals reviewed.  Constitutional:      Appearance: He is well-developed.  Cardiovascular:     Rate and Rhythm: Regular rhythm. Tachycardia present.  Pulmonary:     Effort: Pulmonary effort is normal. No tachypnea.  Abdominal:     General: There is distension (mild).     Tenderness: There is no abdominal tenderness.     Hernia: No hernia is present.  Musculoskeletal:     Right lower leg: No edema.     Left lower leg:  No edema.  Skin:    Capillary Refill: Capillary refill takes less than 2 seconds.     Coloration: Skin is not jaundiced.  Neurological:     Mental Status: He is alert.      Assessment/Plan: SBO without prior surgery -he certainly has some concerning findings on CT scan with fluid and edema. However his exam right now is not concerning to me.  He is 72 and would like to give him every chance not to have surgery unless he needs it.I do think there is a good chance he might require surgery and discussed this with he and his family.  -I think reasonable to monitor him, wait for his lactate, serial exams and attempt the protocol. -pharm dvt prophylaxis is fine  I discussed case with ER provider. I reviewed vitals and labs. Reviewed ct scan and agree with findings of sbo.   Emelia Loron 02/25/2023, 6:30 PM

## 2023-02-25 NOTE — ED Triage Notes (Signed)
Pt BIB [facility] from home d/t cp started about an hour ago. Pt is pain free now. Pt has been having intermittent sob for the past 3 weeks. EMS EKG showed AFIB. Pt takes Eliquis for previous stroke.  324 ASA given by EMS. A&O X4. Wheelchair bound at baseline.   BP 162/100 HR 90-130  SPO2 98% RA  RR 20

## 2023-02-25 NOTE — ED Notes (Signed)
Attempted IV twice unsuccessful

## 2023-02-25 NOTE — H&P (Signed)
History and Physical    PatientMarland Kitchen Stephen Flynn NWG:[id number] DOB: 02/12/1928 DOA: 02/25/2023 DOS: the patient was seen and examined on 02/25/2023 PCP: Creola Corn, MD  Patient coming from: Home.  Lives with his wife.  Uses wheelchair and walker at baseline.  Chief Complaint:  Chief Complaint  Patient presents with   Chest Pain   Shortness of Breath   HPI: Stephen Flynn is a 89 y.o. male with PMH of persistent A-fib on Eliquis, diastolic CHF, CVA, BPH, DM-2, thoracic AAA and debility with wheelchair dependence presenting with acute onset abdominal pain.  History provided by patient and patient's wife and daughter at the bedside.  Patient reports acute onset abdominal pain since 11 AM.  Pain is diffuse.  Described the pain as cramping.  No radiation.  Rates his pain about 8/10.  No elevating or aggravating factor.  He denies nausea or vomiting.  Last bowel movement 3 days ago.  Not sure about flatus.  He denies chest pain, shortness of breath, fever, chills, dysuria, frequency or urgency.  Denies prior history of SBO or abdominal surgery.  Denies focal neurosymptoms.  Patient is on Eliquis for A-fib.  He says his last dose was on Wednesday but not sure.  Reports not taking Eliquis consistently due to "stomach upset".   Patient denies smoking cigarette, drinking alcohol recreational drug use.  Likes to have cardiopulmonary substation and an event of sudden cardiopulmonary arrest.  In ED, in A-fib with RVR with HR as high as 120s to 130.  BP elevated to 166/121.  Saturating in upper 90s to 100% on RA.  K3.0.  Otherwise, CMP without significant finding.  Lipase within normal.  Hgb 10.0.  BNP 725.  Troponin 13.  EKG features A-fib with RVR to 123.  CXR without acute finding.  CT abdomen and pelvis showed small bowel obstruction, moderate ascites and BPH.  General surgery consulted and recommended conservative management with NG tube decompression.  Patient received LR bolus 500 cc, morphine and Zofran.   IV KCl ordered.  Hospitalist service called for admission.   Review of Systems: As mentioned in the history of present illness. All other systems reviewed and are negative. Past Medical History:  Diagnosis Date   Anemia    Aortic insufficiency    Chest pain    H/O: GI bleed    Hyperlipidemia    Hypertension    Leg cramps    Stroke [facility])    Past Surgical History:  Procedure Laterality Date   CARDIOVASCULAR STRESS TEST  09/04/2005   US ECHOCARDIOGRAPHY  11/26/2007   ef 55-60%   Social History:  reports that he has quit smoking. He has never used smokeless tobacco. He reports that he does not drink alcohol and does not use drugs.  Allergies  Allergen Reactions   Penicillin G     Other reaction(s): Drowsy   Penicillins Hives    Did it involve swelling of the face/tongue/throat, SOB, or low BP? No Did it involve sudden or severe rash/hives, skin peeling, or any reaction on the inside of your mouth or nose? yes Did you need to seek medical attention at a hospital or doctor's office? yes When did it last happen?  child     If all above answers are "NO", may proceed with cephalosporin use.    History reviewed. No pertinent family history.  Prior to Admission medications   Medication Sig Start Date End Date Taking? Authorizing Provider  amLODipine (NORVASC) 5 MG tablet Take 1 tablet by  mouth daily.   Yes [provider]  acetaminophen (TYLENOL) 500 MG tablet TAKE ONE TABLET BY MOUTH FOUR TIMES A DAY AS NEEDED - DISCONTINUE ADVIL 11/11/20   [provider]  apixaban (ELIQUIS) 5 MG TABS tablet Take 1 tablet (5 mg total) by mouth 2 (two) times daily. 09/19/22 10/19/22  Newman Nip, NP  atorvastatin (LIPITOR) 80 MG tablet Take 1 tablet po q daily Patient not taking: Reported on 02/25/2023    [provider]  Carboxymethylcellulose Sodium 0.25 % SOLN INSTILL 1 DROP IN BOTH EYES TWICE A DAY Patient not taking: Reported on 02/25/2023 08/13/20   [provider]  diclofenac Sodium (VOLTAREN) 1 % GEL APPLY 2 GRAMS TO AFFECTED AREA TWICE A DAY AS NEEDED Patient not taking: Reported on 02/25/2023 05/20/20   [provider]  ferrous sulfate 325 (65 FE) MG tablet TAKE ONE TABLET BY MOUTH MONDAY, WEDNESDAY AND FRIDAY TAKE WITH VITAMIN C OR A GLASS OF ORANGE JUICE Patient not taking: Reported on 02/25/2023 05/25/20   [provider]  furosemide (LASIX) 20 MG tablet Take 1 tablet by mouth daily. Patient not taking: Reported on 02/25/2023 07/23/20   [provider]  gabapentin (NEURONTIN) 100 MG capsule Take 1 capsule by mouth 2 (two) times daily. 07/14/21   [provider]  hydrALAZINE (APRESOLINE) 25 MG tablet Take 1 tablet by mouth 2 (two) times daily.    [provider]  metFORMIN (GLUCOPHAGE) 500 MG tablet TAKE ONE TABLET BY MOUTH TWICE A DAY FOR DIABETES (ANNUAL KIDNEY FUNCTION TESTING IS NEEDED) 07/14/21   [provider]  Polyethyl Glycol-Propyl Glycol (SYSTANE FREE OP) Place 1 drop into both eyes daily as needed (dry eyes).     [provider]  polyethylene glycol powder (GLYCOLAX/MIRALAX) 17 GM/SCOOP powder TAKE 17 GRAMS BY MOUTH TWICE A DAY (MIX WITH WATER OR OTHER LIQUID AS INSTRUCTED BEFORE DRINKING) FOR CONSTIPATION 07/23/20   [provider]  senna-docusate (SENOKOT-S) 8.6-50 MG tablet TAKE 2 TABLETS BY MOUTH AT BEDTIME FOR CONSTIPATION 07/23/20   [provider]  tamsulosin (FLOMAX) 0.4 MG CAPS capsule Take 0.4 mg by mouth.    [provider]    Physical Exam: Vitals:   02/25/23 1445 02/25/23 1500 02/25/23 1530 02/25/23 1745  BP: (!) 167/149 (!) 158/122  (!) 166/121  Pulse: 77 92 91 (!) 135  Resp: 16 (!) 23 19 20   Temp:      TempSrc:      SpO2: 96% 100% 93% 96%   GENERAL: No apparent distress.  Nontoxic. HEENT: MMM.  Vision and hearing grossly intact.  And G-tube in place. NECK: Supple.  No apparent JVD.  RESP:  No IWOB.  Fair aeration  bilaterally. CVS: Blood rhythm.  HR ranges from 110s to 120s.  Heart sounds normal.  ABD/GI/GU: BS+. Abd soft, NTND.  MSK/EXT:   No apparent deformity. Moves extremities.  1+ BLE edema. SKIN: no apparent skin lesion or wound NEURO: Awake and alert. Oriented appropriately.  No apparent focal neuro deficit. PSYCH: Calm. Normal affect.  Data Reviewed: See HPI  Assessment and Plan: Principal Problem:   SBO (small bowel obstruction) (HCC) Active Problems:   G E R D   BPH (benign prostatic hyperplasia)   Chronic diastolic heart failure (HCC)   Hyperlipidemia   Thoracic aortic aneurysm without rupture (HCC)   Type 2 diabetes mellitus without complications (HCC)   Persistent atrial fibrillation with RVR (HCC)  Small bowel obstruction: Confirmed on CT abdomen and pelvis.  Patient presents with acute diffuse abdominal pain.  No nausea or vomiting.  LBM 3 days ago.  Unsure about last flatus.  History of A-fib on Eliquis but not consistent. -SBO protocol per general surgery-G-tube placed for bowel decompression. -N.p.o., IV fluid, analgesics, antiemetics and IV PPI -Update TTE in case surgery is indicated.  Persistent atrial fibrillation with RVR: Likely in the setting of SBO.  Does not seem to be on rate or rhythm control.  Not compliant with Eliquis. -Start Cardizem drip -IV heparin for anticoagulation -Optimize electrolytes  Chronic diastolic CHF: Stable.  TTE in 2020 with LVEF of 55 to 60% and diastolic dysfunction with normal RVSP.  Denies cardiopulmonary symptoms although he reported some chest pain or dyspnea on initial arrival.  BNP elevated but CXR without vascular congestion.  He has 1+ BLE edema likely dependent. -Update echocardiogram -Closely monitor fluid and respiratory status while on IV fluid -Strict intake and output, daily weight, renal functions and electrolytes  History of type 2 diabetes: Per wife, borderline.  Not on medication.  A1c 7.4% in 2020 -Check hemoglobin  A1c   Uncontrolled hypertension: BP elevated to 160s/120s.  Likely due to #1. -IV Cardizem as above -IV labetalol as needed  History of BPH -Monitor urine output  History of CVA/hyperlipidemia -Continue home meds after med rec  Thoracic arctic aneurysm -Optimize blood pressure control  Debility/ambulatory dysfunction: Reports using wheelchair and walker at baseline -PT/OT eval  Hypokalemia: K3.0.  Mg 1.8 -IV KCl 10 mill equivalent x 4 ordered by EDP -Add IV KCl to fluid as well -IV magnesium sulfate 2 g x 1 given A-fib with RVR    Advance Care Planning:   Code Status: Full Code discussed with patient, wife and daughter at bedside  Consults: General Surgery  Family Communication: Updated patient's wife and daughter at the bedside  Severity of Illness: The appropriate patient status for this patient is INPATIENT. Inpatient status is judged to be reasonable and necessary in order to provide the required intensity of service to ensure the patient's safety. The patient's presenting symptoms, physical exam findings, and initial radiographic and laboratory data in the context of their chronic comorbidities is felt to place them at high risk for further clinical deterioration. Furthermore, it is not anticipated that the patient will be medically stable for discharge from the hospital within 2 midnights of admission.   * I certify that at the point of admission it is my clinical judgment that the patient will require inpatient hospital care spanning beyond 2 midnights from the point of admission due to high intensity of service, high risk for further deterioration and high frequency of surveillance required.*  Author: Almon Hercules, MD 02/25/2023 6:41 PM  For on call review [email or web address].

## 2023-02-25 NOTE — ED Provider Notes (Signed)
[name] EMERGENCY DEPARTMENT AT [facility] Provider Note   CSN: [id number] Arrival date & time: 02/25/23  1410     History  Chief Complaint  Patient presents with   Chest Pain   Shortness of Breath    Stephen Flynn is a 89 y.o. male.  89 yo M with a cc of abdominal pain.  He said he ate breakfast and had sudden onset abdominal discomfort.  He thinks it hurts mostly in his epigastric and right upper quadrant area.  He feels like he needs to belch.  Feels like it goes into his chest.  Feels a bit better now.  Nothing seems to make it better or worse.  He denies cough congestion or fever.  Denies urinary symptoms.   Chest Pain Associated symptoms: shortness of breath   Shortness of Breath Associated symptoms: chest pain        Home Medications Prior to Admission medications   Medication Sig Start Date End Date Taking? Authorizing Provider  acetaminophen (TYLENOL) 500 MG tablet TAKE ONE TABLET BY MOUTH FOUR TIMES A DAY AS NEEDED - DISCONTINUE ADVIL 11/11/20   [provider]  amLODipine (NORVASC) 5 MG tablet Take 1 tablet by mouth daily.    [provider]  apixaban (ELIQUIS) 5 MG TABS tablet Take 1 tablet (5 mg total) by mouth 2 (two) times daily. 09/19/22 10/19/22  Newman Nip, NP  atorvastatin (LIPITOR) 80 MG tablet Take 1 tablet po q daily    [provider]  Carboxymethylcellulose Sodium 0.25 % SOLN INSTILL 1 DROP IN BOTH EYES TWICE A DAY 08/13/20   [provider]  diclofenac Sodium (VOLTAREN) 1 % GEL APPLY 2 GRAMS TO AFFECTED AREA TWICE A DAY AS NEEDED 05/20/20   [provider]  ferrous sulfate 325 (65 FE) MG tablet TAKE ONE TABLET BY MOUTH MONDAY, WEDNESDAY AND FRIDAY TAKE WITH VITAMIN C OR A GLASS OF ORANGE JUICE 05/25/20   [provider]  furosemide (LASIX) 20 MG tablet Take 1 tablet by mouth daily. 07/23/20   [provider]  gabapentin (NEURONTIN) 100 MG capsule Take 1 capsule by mouth 2  (two) times daily. 07/14/21   [provider]  hydrALAZINE (APRESOLINE) 25 MG tablet Take 1 tablet by mouth 2 (two) times daily.    [provider]  metFORMIN (GLUCOPHAGE) 500 MG tablet TAKE ONE TABLET BY MOUTH TWICE A DAY FOR DIABETES (ANNUAL KIDNEY FUNCTION TESTING IS NEEDED) 07/14/21   [provider]  Polyethyl Glycol-Propyl Glycol (SYSTANE FREE OP) Place 1 drop into both eyes daily as needed (dry eyes).     [provider]  polyethylene glycol powder (GLYCOLAX/MIRALAX) 17 GM/SCOOP powder TAKE 17 GRAMS BY MOUTH TWICE A DAY (MIX WITH WATER OR OTHER LIQUID AS INSTRUCTED BEFORE DRINKING) FOR CONSTIPATION 07/23/20   [provider]  senna-docusate (SENOKOT-S) 8.6-50 MG tablet TAKE 2 TABLETS BY MOUTH AT BEDTIME FOR CONSTIPATION 07/23/20   [provider]  tamsulosin (FLOMAX) 0.4 MG CAPS capsule Take 0.4 mg by mouth.    [provider]      Allergies    Penicillin g and Penicillins    Review of Systems   Review of Systems  Respiratory:  Positive for shortness of breath.   Cardiovascular:  Positive for chest pain.    Physical Exam Updated Vital Signs BP (!) 167/149   Pulse 77   Temp (!) 97.5 F (36.4 C) (Oral)   Resp 16   SpO2 96%  Physical Exam  Vitals and nursing note reviewed.  Constitutional:      Appearance: He is well-developed.  HENT:     Head: Normocephalic and atraumatic.  Eyes:     Pupils: Pupils are equal, round, and reactive to light.  Neck:     Vascular: No JVD.  Cardiovascular:     Rate and Rhythm: Normal rate and regular rhythm.     Heart sounds: No murmur heard.    No friction rub. No gallop.  Pulmonary:     Effort: No respiratory distress.     Breath sounds: No wheezing.  Abdominal:     General: There is no distension.     Tenderness: There is abdominal tenderness. There is no guarding or rebound.     Comments: Diffuse abdominal pain on exam worse to the upper part of the abdomen.  Musculoskeletal:         General: Normal range of motion.     Cervical back: Normal range of motion and neck supple.  Skin:    Coloration: Skin is not pale.     Findings: No rash.  Neurological:     Mental Status: He is alert and oriented to person, place, and time.  Psychiatric:        Behavior: Behavior normal.     ED Results / Procedures / Treatments   Labs (all labs ordered are listed, but only abnormal results are displayed) Labs Reviewed  CBC WITH DIFFERENTIAL/PLATELET - Abnormal; Notable for the following components:      Result Value   RBC 3.17 (*)    Hemoglobin 10.0 (*)    HCT 31.0 (*)    All other components within normal limits  I-STAT CHEM 8, ED - Abnormal; Notable for the following components:   Potassium 3.1 (*)    Glucose, Bld 159 (*)    Hemoglobin 10.9 (*)    HCT 32.0 (*)    All other components within normal limits  COMPREHENSIVE METABOLIC PANEL  LIPASE, BLOOD  BRAIN NATRIURETIC PEPTIDE  TROPONIN I (HIGH SENSITIVITY)    EKG None  Radiology No results found.  Procedures Procedures    Medications Ordered in ED Medications  morphine (PF) 2 MG/ML injection 2 mg (has no administration in time range)  ondansetron (ZOFRAN) injection 4 mg (has no administration in time range)    ED Course/ Medical Decision Making/ A&P Clinical Course as of 02/25/23 1512  Sun Feb 25, 2023  1459 Epigastric pain after breakfast, likely needs belly scan, radiated to chest, follow up labs likely scan, hx afib [JD]    Clinical Course User Index [JD] Laurence Spates, MD                                 Medical Decision Making Amount and/or Complexity of Data Reviewed Labs: ordered. Radiology: ordered.  Risk Prescription drug management.   89 yo M with a chief complaints of abdominal pain.  This has been going on since breakfast.  Patient has significant abdominal pain on exam.  Will likely CT.  Chest x-ray blood work.  Treat pain and nausea.  Reassess.   CBC without acute  anemia, no significant electrolyte abnormalities on the i-STAT.  No function presently at baseline.  Obtain a CT scan with contrast.  Patient care was signed out to Dr. Earlene Plater, please see his note for further details care in the ED.  The patients results and plan were reviewed and discussed.  Any x-rays performed were independently reviewed by myself.   Differential diagnosis were considered with the presenting HPI.  Medications  morphine (PF) 2 MG/ML injection 2 mg (has no administration in time range)  ondansetron (ZOFRAN) injection 4 mg (has no administration in time range)    Vitals:   02/25/23 1430 02/25/23 1436 02/25/23 1445  BP: (!) 162/122  (!) 167/149  Pulse: (!) 104  77  Resp: 20  16  Temp:  (!) 97.5 F (36.4 C)   TempSrc:  Oral   SpO2: 96%  96%    Final diagnoses:  Upper abdominal pain              Final Clinical Impression(s) / ED Diagnoses Final diagnoses:  Upper abdominal pain    Rx / DC Orders ED Discharge Orders     None         Melene Plan, DO 02/25/23 1512

## 2023-02-25 NOTE — ED Notes (Signed)
ED TO INPATIENT HANDOFF REPORT  ED Nurse Name and Phone #: [phone] Lendon Ka Name/Age/Gender Stephen Flynn 89 y.o. male Room/Bed: 026C/026C  Code Status   Code Status: Prior  Home/SNF/Other Home Patient oriented to: self, place, time, and situation Is this baseline? Yes   Triage Complete: Triage complete  Chief Complaint SBO (small bowel obstruction) (HCC) [K56.609]  Triage Note Pt BIB [facility] from home d/t cp started about an hour ago. Pt is pain free now. Pt has been having intermittent sob for the past 3 weeks. EMS EKG showed AFIB. Pt takes Eliquis for previous stroke.  324 ASA given by EMS. A&O X4. Wheelchair bound at baseline.   BP 162/100 HR 90-130  SPO2 98% RA  RR 20    Allergies Allergies  Allergen Reactions   Penicillin G     Other reaction(s): Drowsy   Penicillins Hives    Did it involve swelling of the face/tongue/throat, SOB, or low BP? No Did it involve sudden or severe rash/hives, skin peeling, or any reaction on the inside of your mouth or nose? yes Did you need to seek medical attention at a hospital or doctor's office? yes When did it last happen?  child     If all above answers are "NO", may proceed with cephalosporin use.    Level of Care/Admitting Diagnosis ED Disposition     ED Disposition  Admit   Condition  --   Comment  Hospital Area: [facility] [[id number]]  Level of Care: Telemetry Medical [104]  May admit patient to [facility] or [facility] if equivalent level of care is available:: No  Covid Evaluation: Asymptomatic - no recent exposure (last 10 days) testing not required  Diagnosis: SBO (small bowel obstruction) [facility]) [[id number]]  Admitting Physician: Almon Hercules [[id number]]  Attending Physician: Almon Hercules [id number]  Certification:: I certify this patient will need inpatient services for at least 2 midnights  Expected Medical Readiness: 02/27/2023          B Medical/Surgery History Past Medical History:   Diagnosis Date   Anemia    Aortic insufficiency    Chest pain    H/O: GI bleed    Hyperlipidemia    Hypertension    Leg cramps    Stroke [facility])    Past Surgical History:  Procedure Laterality Date   CARDIOVASCULAR STRESS TEST  09/04/2005   US ECHOCARDIOGRAPHY  11/26/2007   ef 55-60%     A IV Location/Drains/Wounds Patient Lines/Drains/Airways Status     Active Line/Drains/Airways     Name Placement date Placement time Site Days   Peripheral IV 01/05/23 20 G Anterior;Distal;Right;Upper Antecubital 01/05/23  1521  Antecubital  51   Peripheral IV 02/25/23 18 G Posterior;Right Forearm 02/25/23  --  Forearm  less than 1            Intake/Output Last 24 hours No intake or output data in the 24 hours ending 02/25/23 1819  Labs/Imaging Results for orders placed or performed during the hospital encounter of 02/25/23 (from the past 48 hour(s))  CBC with Differential     Status: Abnormal   Collection Time: 02/25/23  2:33 PM  Result Value Ref Range   WBC 5.2 4.0 - 10.5 K/uL   RBC 3.17 (L) 4.22 - 5.81 MIL/uL   Hemoglobin 10.0 (L) 13.0 - 17.0 g/dL   HCT 29.5 (L) 62.1 - 30.8 %   MCV 97.8 80.0 - 100.0 fL   MCH 31.5 26.0 -  34.0 pg   MCHC 32.3 30.0 - 36.0 g/dL   RDW 16.1 09.6 - 04.5 %   Platelets 304 150 - 400 K/uL   nRBC 0.0 0.0 - 0.2 %   Neutrophils Relative % 60 %   Neutro Abs 3.1 1.7 - 7.7 K/uL   Lymphocytes Relative 32 %   Lymphs Abs 1.7 0.7 - 4.0 K/uL   Monocytes Relative 4 %   Monocytes Absolute 0.2 0.1 - 1.0 K/uL   Eosinophils Relative 3 %   Eosinophils Absolute 0.2 0.0 - 0.5 K/uL   Basophils Relative 1 %   Basophils Absolute 0.0 0.0 - 0.1 K/uL   Immature Granulocytes 0 %   Abs Immature Granulocytes 0.01 0.00 - 0.07 K/uL    Comment: Performed at [facility], [address] [place], [place] [address]  Comprehensive metabolic panel     Status: Abnormal   Collection Time: 02/25/23  2:33 PM  Result Value Ref Range   Sodium 139 135 - 145 mmol/L   Potassium  3.0 (L) 3.5 - 5.1 mmol/L   Chloride 104 98 - 111 mmol/L   CO2 23 22 - 32 mmol/L   Glucose, Bld 160 (H) 70 - 99 mg/dL    Comment: Glucose reference range applies only to samples taken after fasting for at least 8 hours.   BUN 11 8 - 23 mg/dL   Creatinine, Ser 1.91 0.61 - 1.24 mg/dL   Calcium 9.3 8.9 - 47.8 mg/dL   Total Protein 7.0 6.5 - 8.1 g/dL   Albumin 3.2 (L) 3.5 - 5.0 g/dL   AST 14 (L) 15 - 41 U/L   ALT 11 0 - 44 U/L   Alkaline Phosphatase 44 38 - 126 U/L   Total Bilirubin 0.8 0.3 - 1.2 mg/dL   GFR, Estimated >29 >56 mL/min    Comment: (NOTE) Calculated using the CKD-EPI Creatinine Equation (2021)    Anion gap 12 5 - 15    Comment: Performed at [facility], [address] [place], [place] [address]  Lipase, blood     Status: None   Collection Time: 02/25/23  2:33 PM  Result Value Ref Range   Lipase 16 11 - 51 U/L    Comment: Performed at [facility], [address] [place], [place] [address]  Troponin I (High Sensitivity)     Status: None   Collection Time: 02/25/23  2:33 PM  Result Value Ref Range   Troponin I (High Sensitivity) 13 <18 ng/L    Comment: (NOTE) Elevated high sensitivity troponin I (hsTnI) values and significant  changes across serial measurements may suggest ACS but many other  chronic and acute conditions are known to elevate hsTnI results.  Refer to the "Links" section for chest pain algorithms and additional  guidance. Performed at [facility], [address] [place], [place] [address]   Brain natriuretic peptide     Status: Abnormal   Collection Time: 02/25/23  2:33 PM  Result Value Ref Range   B Natriuretic Peptide 725.0 (H) 0.0 - 100.0 pg/mL    Comment: Performed at [facility], [address], [place], [place] [address]  Magnesium     Status: None   Collection Time: 02/25/23  2:33 PM  Result Value Ref Range   Magnesium 1.8 1.7 - 2.4 mg/dL    Comment: Performed at [facility], [address]  [place], [place] [address]  I-stat chem 8, ED (not  at [facility], [facility] or [facility])     Status: Abnormal   Collection Time: 02/25/23  2:59 PM  Result Value Ref Range   Sodium 140 135 - 145 mmol/L   Potassium 3.1 (L) 3.5 - 5.1 mmol/L   Chloride 104 98 - 111 mmol/L   BUN 11 8 - 23 mg/dL   Creatinine, Ser 2.37 0.61 - 1.24 mg/dL   Glucose, Bld 628 (H) 70 - 99 mg/dL    Comment: Glucose reference range applies only to samples taken after fasting for at least 8 hours.   Calcium, Ion 1.16 1.15 - 1.40 mmol/L   TCO2 25 22 - 32 mmol/L   Hemoglobin 10.9 (L) 13.0 - 17.0 g/dL   HCT 31.5 (L) 17.6 - 16.0 %   CT ABDOMEN PELVIS W CONTRAST  Result Date: 02/25/2023 CLINICAL DATA:  Acute abdominal pain. EXAM: CT ABDOMEN AND PELVIS WITH CONTRAST TECHNIQUE: Multidetector CT imaging of the abdomen and pelvis was performed using the standard protocol following bolus administration of intravenous contrast. RADIATION DOSE REDUCTION: This exam was performed according to the departmental dose-optimization program which includes automated exposure control, adjustment of the mA and/or kV according to patient size and/or use of iterative reconstruction technique. CONTRAST:  75mL OMNIPAQUE IOHEXOL 350 MG/ML SOLN COMPARISON:  CT 06/02/2019 FINDINGS: Lower chest: The heart is enlarged.  Patulous distal esophagus. Hepatobiliary: Scattered subcentimeter hypodense liver lesions are stable from 2020 exam. No evidence of new liver lesion. Gallbladder physiologically distended, no calcified stone. No biliary dilatation. Pancreas: Pancreatic atrophy.  No ductal dilatation or inflammation. Spleen: Normal in size without focal abnormality. Adrenals/Urinary Tract: No adrenal nodule. No hydronephrosis. Simple cyst arises from the upper pole of the right kidney. Smaller simple cyst in the upper left kidney. No further follow-up imaging is recommended. No renal calculi or suspicious renal lesion. Bladder is physiologically distended. Equivocal bladder wall  thickening. Stomach/Bowel: Dilated fluid-filled stomach. Dilated fluid-filled small bowel in the left mid abdomen. Transition point in the central abdomen series 3, image 45. The more distal small bowel is decompressed. There is prominent mesenteric edema and free fluid. No bowel pneumatosis. Small to moderate volume stool in the colon. The sigmoid colon is redundant. The appendix is not definitively seen. Vascular/Lymphatic: Aortic atherosclerosis. No aneurysm. Portal vein is patent. No portal venous or mesenteric gas. Calcified nodule in the left small bowel mesentery may represent a calcified lymph node. Reproductive: Enlarged prostate gland causing mass effect on the bladder base. Other: Moderate abdominopelvic ascites. Mesenteric edema primarily in the left abdomen. Small amount of ascites in the left inguinal canal. No free air. Left flank subcutaneous lipoma. Musculoskeletal: Advanced right hip arthropathy. Diffuse lumbar degenerative change. There are no acute or suspicious osseous abnormalities. IMPRESSION: 1. Small-bowel obstruction with transition point in the central abdomen. There is prominent mesenteric edema and free fluid. The degree of mesenteric inflammation can predispose to ischemia. Recommend correlation with lactate. 2. Moderate abdominopelvic ascites. 3. Enlarged prostate gland causing mass effect on the bladder base. Equivocal bladder wall thickening. Aortic Atherosclerosis (ICD10-I70.0). Electronically Signed   By: Narda Rutherford M.D.   On: 02/25/2023 16:56   DG Chest Port 1 View  Result Date: 02/25/2023 CLINICAL DATA:  Acute onset chest pain 1 hour ago. Shortness of breath. EXAM: PORTABLE CHEST 1 VIEW COMPARISON:  12/15/2022 FINDINGS: Stable cardiomegaly and ectasia of thoracic aorta. Low lung volumes noted, however both lungs are clear. IMPRESSION: Low lung volumes. No active lung disease. Electronically Signed   By: Dietrich Pates.D.  On: 02/25/2023 15:31    Pending  Labs Unresulted Labs (From admission, onward)     Start     Ordered   02/25/23 1730  Lactic acid, plasma  (Lactic Acid)  Now then every 2 hours,   R      02/25/23 1730            Vitals/Pain Today's Vitals   02/25/23 1445 02/25/23 1500 02/25/23 1530 02/25/23 1745  BP: (!) 167/149 (!) 158/122  (!) 166/121  Pulse: 77 92 91 (!) 135  Resp: 16 (!) 23 19 20   Temp:      TempSrc:      SpO2: 96% 100% 93% 96%    Isolation Precautions No active isolations  Medications Medications  potassium chloride 10 mEq in 100 mL IVPB (10 mEq Intravenous New Bag/Given 02/25/23 1758)  morphine (PF) 2 MG/ML injection 2 mg (2 mg Intravenous Given 02/25/23 1519)  ondansetron (ZOFRAN) injection 4 mg (4 mg Intravenous Given 02/25/23 1518)  iohexol (OMNIPAQUE) 350 MG/ML injection 75 mL (75 mLs Intravenous Contrast Given 02/25/23 1547)  lactated ringers bolus 500 mL (500 mLs Intravenous New Bag/Given 02/25/23 1759)    Mobility walks with device     Focused Assessments    R Recommendations: See Admitting Provider Note  Report given to:   Additional Notes:

## 2023-02-26 ENCOUNTER — Inpatient Hospital Stay (HOSPITAL_COMMUNITY): Payer: 59

## 2023-02-26 ENCOUNTER — Inpatient Hospital Stay (HOSPITAL_COMMUNITY): Payer: 59 | Admitting: Certified Registered Nurse Anesthetist

## 2023-02-26 ENCOUNTER — Encounter (HOSPITAL_COMMUNITY): Admission: EM | Disposition: E | Payer: Self-pay | Source: Home / Self Care | Attending: Emergency Medicine

## 2023-02-26 ENCOUNTER — Other Ambulatory Visit: Payer: Self-pay

## 2023-02-26 DIAGNOSIS — I5032 Chronic diastolic (congestive) heart failure: Secondary | ICD-10-CM

## 2023-02-26 DIAGNOSIS — K56609 Unspecified intestinal obstruction, unspecified as to partial versus complete obstruction: Secondary | ICD-10-CM

## 2023-02-26 DIAGNOSIS — A419 Sepsis, unspecified organism: Secondary | ICD-10-CM

## 2023-02-26 DIAGNOSIS — T17908A Unspecified foreign body in respiratory tract, part unspecified causing other injury, initial encounter: Secondary | ICD-10-CM

## 2023-02-26 DIAGNOSIS — E872 Acidosis, unspecified: Secondary | ICD-10-CM

## 2023-02-26 DIAGNOSIS — Z87891 Personal history of nicotine dependence: Secondary | ICD-10-CM

## 2023-02-26 DIAGNOSIS — I110 Hypertensive heart disease with heart failure: Secondary | ICD-10-CM

## 2023-02-26 DIAGNOSIS — I4891 Unspecified atrial fibrillation: Secondary | ICD-10-CM | POA: Diagnosis not present

## 2023-02-26 DIAGNOSIS — K659 Peritonitis, unspecified: Secondary | ICD-10-CM | POA: Diagnosis not present

## 2023-02-26 HISTORY — PX: BOWEL RESECTION: SHX1257

## 2023-02-26 HISTORY — PX: LAPAROTOMY: SHX154

## 2023-02-26 LAB — POCT I-STAT 7, (LYTES, BLD GAS, ICA,H+H)
Acid-base deficit: 2 mmol/L (ref 0.0–2.0)
Acid-base deficit: 3 mmol/L — ABNORMAL HIGH (ref 0.0–2.0)
Bicarbonate: 23.7 mmol/L (ref 20.0–28.0)
Bicarbonate: 21.7 mmol/L (ref 20.0–28.0)
Calcium, Ion: 1.13 mmol/L — ABNORMAL LOW (ref 1.15–1.40)
Calcium, Ion: 1.19 mmol/L (ref 1.15–1.40)
HCT: 32 % — ABNORMAL LOW (ref 39.0–52.0)
HCT: 30 % — ABNORMAL LOW (ref 39.0–52.0)
Hemoglobin: 10.9 g/dL — ABNORMAL LOW (ref 13.0–17.0)
Hemoglobin: 10.2 g/dL — ABNORMAL LOW (ref 13.0–17.0)
O2 Saturation: 100 %
O2 Saturation: 100 %
Patient temperature: 34.7
Potassium: 5.2 mmol/L — ABNORMAL HIGH (ref 3.5–5.1)
Potassium: 4.1 mmol/L (ref 3.5–5.1)
Sodium: 138 mmol/L (ref 135–145)
Sodium: 141 mmol/L (ref 135–145)
TCO2: 25 mmol/L (ref 22–32)
TCO2: 23 mmol/L (ref 22–32)
pCO2 arterial: 39.7 mmHg (ref 32–48)
pCO2 arterial: 34.6 mmHg (ref 32–48)
pH, Arterial: 7.373 (ref 7.35–7.45)
pH, Arterial: 7.406 (ref 7.35–7.45)
pO2, Arterial: 213 mmHg — ABNORMAL HIGH (ref 83–108)
pO2, Arterial: 460 mmHg — ABNORMAL HIGH (ref 83–108)

## 2023-02-26 LAB — ECHOCARDIOGRAM COMPLETE
AR max vel: 2.55 cm2
AV Area VTI: 2.28 cm2
AV Area mean vel: 2.29 cm2
AV Mean grad: 5 mmHg
AV Peak grad: 8 mmHg
Ao pk vel: 1.41 m/s
S' Lateral: 3.5 cm
Weight: 3679.04 [oz_av]

## 2023-02-26 LAB — CBC
HCT: 41.2 % (ref 39.0–52.0)
HCT: 30.2 % — ABNORMAL LOW (ref 39.0–52.0)
Hemoglobin: 13.2 g/dL (ref 13.0–17.0)
Hemoglobin: 9.7 g/dL — ABNORMAL LOW (ref 13.0–17.0)
MCH: 31.4 pg (ref 26.0–34.0)
MCH: 30.9 pg (ref 26.0–34.0)
MCHC: 32 g/dL (ref 30.0–36.0)
MCHC: 32.1 g/dL (ref 30.0–36.0)
MCV: 98.1 fL (ref 80.0–100.0)
MCV: 96.2 fL (ref 80.0–100.0)
Platelets: 356 10*3/uL (ref 150–400)
Platelets: 255 10*3/uL (ref 150–400)
RBC: 4.2 MIL/uL — ABNORMAL LOW (ref 4.22–5.81)
RBC: 3.14 MIL/uL — ABNORMAL LOW (ref 4.22–5.81)
RDW: 14.9 % (ref 11.5–15.5)
RDW: 14.9 % (ref 11.5–15.5)
WBC: 4.4 10*3/uL (ref 4.0–10.5)
WBC: 1.7 10*3/uL — ABNORMAL LOW (ref 4.0–10.5)
nRBC: 0 % (ref 0.0–0.2)
nRBC: 0 % (ref 0.0–0.2)

## 2023-02-26 LAB — COMPREHENSIVE METABOLIC PANEL
ALT: 9 U/L (ref 0–44)
ALT: 12 U/L (ref 0–44)
AST: 13 U/L — ABNORMAL LOW (ref 15–41)
AST: 23 U/L (ref 15–41)
Albumin: 2.8 g/dL — ABNORMAL LOW (ref 3.5–5.0)
Albumin: 2.7 g/dL — ABNORMAL LOW (ref 3.5–5.0)
Alkaline Phosphatase: 47 U/L (ref 38–126)
Alkaline Phosphatase: 31 U/L — ABNORMAL LOW (ref 38–126)
Anion gap: 12 (ref 5–15)
Anion gap: 11 (ref 5–15)
BUN: 15 mg/dL (ref 8–23)
BUN: 18 mg/dL (ref 8–23)
CO2: 24 mmol/L (ref 22–32)
CO2: 20 mmol/L — ABNORMAL LOW (ref 22–32)
Calcium: 9.2 mg/dL (ref 8.9–10.3)
Calcium: 8.3 mg/dL — ABNORMAL LOW (ref 8.9–10.3)
Chloride: 102 mmol/L (ref 98–111)
Chloride: 107 mmol/L (ref 98–111)
Creatinine, Ser: 1.17 mg/dL (ref 0.61–1.24)
Creatinine, Ser: 1.29 mg/dL — ABNORMAL HIGH (ref 0.61–1.24)
GFR, Estimated: 58 mL/min — ABNORMAL LOW (ref >60)
GFR, Estimated: 52 mL/min — ABNORMAL LOW (ref >60)
Glucose, Bld: 160 mg/dL — ABNORMAL HIGH (ref 70–99)
Glucose, Bld: 132 mg/dL — ABNORMAL HIGH (ref 70–99)
Potassium: 4.2 mmol/L (ref 3.5–5.1)
Potassium: 4.1 mmol/L (ref 3.5–5.1)
Sodium: 138 mmol/L (ref 135–145)
Sodium: 138 mmol/L (ref 135–145)
Total Bilirubin: 0.5 mg/dL (ref 0.3–1.2)
Total Bilirubin: 0.6 mg/dL (ref 0.3–1.2)
Total Protein: 6.6 g/dL (ref 6.5–8.1)
Total Protein: 5.2 g/dL — ABNORMAL LOW (ref 6.5–8.1)

## 2023-02-26 LAB — TYPE AND SCREEN
ABO/RH(D): O POS
Antibody Screen: NEGATIVE

## 2023-02-26 LAB — PHOSPHORUS
Phosphorus: 4 mg/dL (ref 2.5–4.6)
Phosphorus: 4 mg/dL (ref 2.5–4.6)

## 2023-02-26 LAB — HEMOGLOBIN A1C
Hgb A1c MFr Bld: 6.5 % — ABNORMAL HIGH (ref 4.8–5.6)
Mean Plasma Glucose: 139.85 mg/dL

## 2023-02-26 LAB — HEPARIN LEVEL (UNFRACTIONATED)
Heparin Unfractionated: 0.2 [IU]/mL — ABNORMAL LOW (ref 0.30–0.70)
Heparin Unfractionated: 0.35 [IU]/mL (ref 0.30–0.70)

## 2023-02-26 LAB — APTT
aPTT: 49 s — ABNORMAL HIGH (ref 24–36)
aPTT: 72 seconds — ABNORMAL HIGH (ref 24–36)

## 2023-02-26 LAB — GLUCOSE, CAPILLARY
Glucose-Capillary: 164 mg/dL — ABNORMAL HIGH (ref 70–99)
Glucose-Capillary: 146 mg/dL — ABNORMAL HIGH (ref 70–99)
Glucose-Capillary: 114 mg/dL — ABNORMAL HIGH (ref 70–99)
Glucose-Capillary: 111 mg/dL — ABNORMAL HIGH (ref 70–99)
Glucose-Capillary: 117 mg/dL — ABNORMAL HIGH (ref 70–99)

## 2023-02-26 LAB — MAGNESIUM
Magnesium: 2.2 mg/dL (ref 1.7–2.4)
Magnesium: 1.8 mg/dL (ref 1.7–2.4)

## 2023-02-26 LAB — MRSA NEXT GEN BY PCR, NASAL: MRSA by PCR Next Gen: NOT DETECTED

## 2023-02-26 LAB — ABO/RH: ABO/RH(D): O POS

## 2023-02-26 LAB — LACTIC ACID, PLASMA: Lactic Acid, Venous: 3.9 mmol/L (ref 0.5–1.9)

## 2023-02-26 SURGERY — LAPAROTOMY, EXPLORATORY
Anesthesia: General | Site: Abdomen

## 2023-02-26 MED ORDER — HEPARIN (PORCINE) 25000 UT/250ML-% IV SOLN
1600.0000 [IU]/h | INTRAVENOUS | Status: DC
  Administered 2023-02-27 – 2023-02-28 (×3): 1250 [IU]/h via INTRAVENOUS
  Administered 2023-03-01: 1400 [IU]/h via INTRAVENOUS
  Administered 2023-03-03: 1600 [IU]/h via INTRAVENOUS
  Filled 2023-02-26 (×6): qty 250

## 2023-02-26 MED ORDER — DEXAMETHASONE SODIUM PHOSPHATE 10 MG/ML IJ SOLN
INTRAMUSCULAR | Status: DC | PRN
  Administered 2023-02-26: 4 mg via INTRAVENOUS

## 2023-02-26 MED ORDER — LIDOCAINE 2% (20 MG/ML) 5 ML SYRINGE
INTRAMUSCULAR | Status: DC | PRN
  Administered 2023-02-26: 60 mg via INTRAVENOUS

## 2023-02-26 MED ORDER — DEXMEDETOMIDINE HCL IN NACL 400 MCG/100ML IV SOLN
INTRAVENOUS | Status: DC | PRN
  Administered 2023-02-26: .5 ug/kg/h via INTRAVENOUS

## 2023-02-26 MED ORDER — LACTATED RINGERS IV SOLN: INTRAVENOUS | Status: AC

## 2023-02-26 MED ORDER — SUCCINYLCHOLINE CHLORIDE 200 MG/10ML IV SOSY
PREFILLED_SYRINGE | INTRAVENOUS | Status: AC
  Filled 2023-02-26: qty 10

## 2023-02-26 MED ORDER — ONDANSETRON HCL 4 MG/2ML IJ SOLN
INTRAMUSCULAR | Status: DC | PRN
  Administered 2023-02-26: 4 mg via INTRAVENOUS

## 2023-02-26 MED ORDER — DEXMEDETOMIDINE HCL IN NACL 400 MCG/100ML IV SOLN
0.0000 ug/kg/h | INTRAVENOUS | Status: DC
  Administered 2023-02-26 (×2): 0.6 ug/kg/h via INTRAVENOUS
  Administered 2023-02-26: 0.9 ug/kg/h via INTRAVENOUS
  Filled 2023-02-26 (×2): qty 100

## 2023-02-26 MED ORDER — SODIUM CHLORIDE 0.9 % IV SOLN: 250.0000 mL | INTRAVENOUS | Status: DC

## 2023-02-26 MED ORDER — LIDOCAINE 2% (20 MG/ML) 5 ML SYRINGE
INTRAMUSCULAR | Status: AC
  Filled 2023-02-26: qty 5

## 2023-02-26 MED ORDER — SODIUM CHLORIDE 0.9% FLUSH
10.0000 mL | INTRAVENOUS | Status: DC | PRN
  Administered 2023-03-15: 20 mL
  Administered 2023-03-15: 10 mL

## 2023-02-26 MED ORDER — SODIUM CHLORIDE 0.9 % IV SOLN
2.0000 g | Freq: Two times a day (BID) | INTRAVENOUS | Status: DC
  Administered 2023-02-26 – 2023-02-28 (×4): 2 g via INTRAVENOUS
  Filled 2023-02-26 (×4): qty 12.5

## 2023-02-26 MED ORDER — METRONIDAZOLE 500 MG/100ML IV SOLN
500.0000 mg | Freq: Two times a day (BID) | INTRAVENOUS | Status: AC
  Administered 2023-02-26 – 2023-03-02 (×9): 500 mg via INTRAVENOUS
  Filled 2023-02-26 (×10): qty 100

## 2023-02-26 MED ORDER — AMIODARONE HCL IN DEXTROSE 360-4.14 MG/200ML-% IV SOLN
60.0000 mg/h | INTRAVENOUS | Status: AC
  Administered 2023-02-26: 60 mg/h via INTRAVENOUS
  Filled 2023-02-26: qty 200

## 2023-02-26 MED ORDER — FENTANYL CITRATE PF 50 MCG/ML IJ SOSY
25.0000 ug | PREFILLED_SYRINGE | INTRAMUSCULAR | Status: DC | PRN
  Administered 2023-02-26 – 2023-02-27 (×6): 50 ug via INTRAVENOUS
  Filled 2023-02-26 (×7): qty 1

## 2023-02-26 MED ORDER — AMIODARONE HCL IN DEXTROSE 360-4.14 MG/200ML-% IV SOLN
30.0000 mg/h | INTRAVENOUS | Status: DC
  Administered 2023-02-26 – 2023-03-02 (×8): 30 mg/h via INTRAVENOUS
  Filled 2023-02-26 (×10): qty 200

## 2023-02-26 MED ORDER — ETOMIDATE 2 MG/ML IV SOLN
INTRAVENOUS | Status: DC | PRN
  Administered 2023-02-26: 4 mg via INTRAVENOUS
  Administered 2023-02-26: 14 mg via INTRAVENOUS

## 2023-02-26 MED ORDER — ROCURONIUM BROMIDE 10 MG/ML (PF) SYRINGE
PREFILLED_SYRINGE | INTRAVENOUS | Status: DC | PRN
  Administered 2023-02-26: 70 mg via INTRAVENOUS
  Administered 2023-02-26: 30 mg via INTRAVENOUS

## 2023-02-26 MED ORDER — DEXAMETHASONE SODIUM PHOSPHATE 10 MG/ML IJ SOLN
INTRAMUSCULAR | Status: AC
  Filled 2023-02-26: qty 1

## 2023-02-26 MED ORDER — ALBUMIN HUMAN 5 % IV SOLN
25.0000 g | Freq: Once | INTRAVENOUS | Status: AC
  Administered 2023-02-26: 25 g via INTRAVENOUS
  Filled 2023-02-26: qty 500

## 2023-02-26 MED ORDER — AMIODARONE LOAD VIA INFUSION
150.0000 mg | Freq: Once | INTRAVENOUS | Status: AC
  Administered 2023-02-26: 150 mg via INTRAVENOUS
  Filled 2023-02-26: qty 83.34

## 2023-02-26 MED ORDER — ALBUMIN HUMAN 5 % IV SOLN: 50.0000 g | Freq: Once | INTRAVENOUS | Status: DC

## 2023-02-26 MED ORDER — FENTANYL CITRATE (PF) 250 MCG/5ML IJ SOLN
INTRAMUSCULAR | Status: AC
  Filled 2023-02-26: qty 5

## 2023-02-26 MED ORDER — CHLORHEXIDINE GLUCONATE CLOTH 2 % EX PADS
6.0000 | MEDICATED_PAD | Freq: Every day | CUTANEOUS | Status: DC
  Administered 2023-02-27 – 2023-03-23 (×25): 6 via TOPICAL

## 2023-02-26 MED ORDER — ETOMIDATE 2 MG/ML IV SOLN
INTRAVENOUS | Status: AC
  Filled 2023-02-26: qty 10

## 2023-02-26 MED ORDER — NOREPINEPHRINE 4 MG/250ML-% IV SOLN
2.0000 ug/min | INTRAVENOUS | Status: DC
  Administered 2023-02-26: 2 ug/min via INTRAVENOUS
  Filled 2023-02-26: qty 250

## 2023-02-26 MED ORDER — ONDANSETRON HCL 4 MG/2ML IJ SOLN
INTRAMUSCULAR | Status: AC
  Filled 2023-02-26: qty 2

## 2023-02-26 MED ORDER — SODIUM CHLORIDE 0.9 % IV SOLN
1.0000 g | Freq: Once | INTRAVENOUS | Status: AC
  Administered 2023-02-26: 1 g via INTRAVENOUS
  Filled 2023-02-26: qty 20

## 2023-02-26 MED ORDER — DOCUSATE SODIUM 50 MG/5ML PO LIQD
100.0000 mg | Freq: Two times a day (BID) | ORAL | Status: DC
  Filled 2023-02-26 (×2): qty 10

## 2023-02-26 MED ORDER — FENTANYL CITRATE PF 50 MCG/ML IJ SOSY: 25.0000 ug | PREFILLED_SYRINGE | INTRAMUSCULAR | Status: DC | PRN

## 2023-02-26 MED ORDER — ROCURONIUM BROMIDE 10 MG/ML (PF) SYRINGE
PREFILLED_SYRINGE | INTRAVENOUS | Status: AC
  Filled 2023-02-26: qty 30

## 2023-02-26 MED ORDER — FENTANYL CITRATE (PF) 250 MCG/5ML IJ SOLN
INTRAMUSCULAR | Status: DC | PRN
  Administered 2023-02-26 (×3): 50 ug via INTRAVENOUS

## 2023-02-26 MED ORDER — SODIUM CHLORIDE 0.9 % IV BOLUS
500.0000 mL | Freq: Once | INTRAVENOUS | Status: AC
  Administered 2023-02-26: 500 mL via INTRAVENOUS

## 2023-02-26 MED ORDER — ALBUMIN HUMAN 5 % IV SOLN: INTRAVENOUS | Status: DC | PRN

## 2023-02-26 MED ORDER — FAMOTIDINE 20 MG PO TABS
20.0000 mg | ORAL_TABLET | Freq: Two times a day (BID) | ORAL | Status: DC
  Filled 2023-02-26 (×2): qty 1

## 2023-02-26 MED ORDER — 0.9 % SODIUM CHLORIDE (POUR BTL) OPTIME
TOPICAL | Status: DC | PRN
  Administered 2023-02-26: 2000 mL

## 2023-02-26 MED ORDER — ORAL CARE MOUTH RINSE: 15.0000 mL | OROMUCOSAL | Status: DC | PRN

## 2023-02-26 MED ORDER — POLYETHYLENE GLYCOL 3350 17 G PO PACK
17.0000 g | PACK | Freq: Every day | ORAL | Status: DC
  Filled 2023-02-26: qty 1

## 2023-02-26 MED ORDER — ORAL CARE MOUTH RINSE
15.0000 mL | OROMUCOSAL | Status: DC
  Administered 2023-02-26 – 2023-02-27 (×15): 15 mL via OROMUCOSAL

## 2023-02-26 MED ORDER — PIPERACILLIN-TAZOBACTAM 3.375 G IVPB 30 MIN: 3.3750 g | Freq: Three times a day (TID) | INTRAVENOUS | Status: DC

## 2023-02-26 MED ORDER — LACTATED RINGERS IV SOLN: INTRAVENOUS | Status: DC | PRN

## 2023-02-26 MED ORDER — SUCCINYLCHOLINE CHLORIDE 200 MG/10ML IV SOSY
PREFILLED_SYRINGE | INTRAVENOUS | Status: DC | PRN
  Administered 2023-02-26: 140 mg via INTRAVENOUS

## 2023-02-26 MED ORDER — SODIUM CHLORIDE 0.9% FLUSH
10.0000 mL | Freq: Two times a day (BID) | INTRAVENOUS | Status: DC
  Administered 2023-02-26 – 2023-03-09 (×22): 10 mL
  Administered 2023-03-10: 40 mL
  Administered 2023-03-10 – 2023-03-11 (×2): 10 mL
  Administered 2023-03-11: 20 mL
  Administered 2023-03-12 – 2023-03-13 (×3): 10 mL
  Administered 2023-03-14: 30 mL
  Administered 2023-03-14: 10 mL
  Administered 2023-03-15: 30 mL
  Administered 2023-03-16 – 2023-03-23 (×14): 10 mL

## 2023-02-26 SURGICAL SUPPLY — 49 items
APL PRP STRL LF DISP 70% ISPRP (MISCELLANEOUS) ×1
BAG COUNTER SPONGE SURGICOUNT (BAG) ×2 IMPLANT
BAG SPNG CNTER NS LX DISP (BAG)
BLADE CLIPPER SURG (BLADE) IMPLANT
BNDG GAUZE DERMACEA FLUFF 4 (GAUZE/BANDAGES/DRESSINGS) IMPLANT
BNDG GZE DERMACEA 4 6PLY (GAUZE/BANDAGES/DRESSINGS) ×1
CANISTER SUCT 3000ML PPV (MISCELLANEOUS) ×2 IMPLANT
CHLORAPREP W/TINT 26 (MISCELLANEOUS) ×2 IMPLANT
COVER SURGICAL LIGHT HANDLE (MISCELLANEOUS) ×2 IMPLANT
DRAPE LAPAROSCOPIC ABDOMINAL (DRAPES) ×2 IMPLANT
DRAPE WARM FLUID 44X44 (DRAPES) ×2 IMPLANT
DRSG OPSITE POSTOP 4X10 (GAUZE/BANDAGES/DRESSINGS) IMPLANT
DRSG OPSITE POSTOP 4X8 (GAUZE/BANDAGES/DRESSINGS) IMPLANT
ELECT BLADE 6.5 EXT (BLADE) IMPLANT
ELECT CAUTERY BLADE 6.4 (BLADE) ×2 IMPLANT
ELECT REM PT RETURN 9FT ADLT (ELECTROSURGICAL) ×1
ELECTRODE REM PT RTRN 9FT ADLT (ELECTROSURGICAL) ×2 IMPLANT
GAUZE PAD ABD 8X10 STRL (GAUZE/BANDAGES/DRESSINGS) IMPLANT
GLOVE BIO SURGEON STRL SZ8 (GLOVE) ×2 IMPLANT
GLOVE BIOGEL PI IND STRL 8 (GLOVE) ×2 IMPLANT
GOWN STRL REUS W/ TWL LRG LVL3 (GOWN DISPOSABLE) ×2 IMPLANT
GOWN STRL REUS W/ TWL XL LVL3 (GOWN DISPOSABLE) ×2 IMPLANT
GOWN STRL REUS W/TWL LRG LVL3 (GOWN DISPOSABLE) ×2
GOWN STRL REUS W/TWL XL LVL3 (GOWN DISPOSABLE) ×1
HANDLE SUCTION POOLE (INSTRUMENTS) ×2 IMPLANT
KIT BASIN OR (CUSTOM PROCEDURE TRAY) ×2 IMPLANT
KIT TURNOVER KIT B (KITS) ×2 IMPLANT
LIGASURE IMPACT 36 18CM CVD LR (INSTRUMENTS) IMPLANT
NS IRRIG 1000ML POUR BTL (IV SOLUTION) ×4 IMPLANT
PACK GENERAL/GYN (CUSTOM PROCEDURE TRAY) ×2 IMPLANT
PAD ARMBOARD 7.5X6 YLW CONV (MISCELLANEOUS) ×2 IMPLANT
PENCIL SMOKE EVACUATOR (MISCELLANEOUS) ×2 IMPLANT
RELOAD PROXIMATE 75MM BLUE (ENDOMECHANICALS) ×2 IMPLANT
RELOAD STAPLE 75 3.8 BLU REG (ENDOMECHANICALS) IMPLANT
SPECIMEN JAR LARGE (MISCELLANEOUS) IMPLANT
SPONGE T-LAP 18X18 ~~LOC~~+RFID (SPONGE) IMPLANT
STAPLER GUN LINEAR PROX 60 (STAPLE) IMPLANT
STAPLER PROXIMATE 75MM BLUE (STAPLE) IMPLANT
STAPLER VISISTAT 35W (STAPLE) ×2 IMPLANT
SUCTION POOLE HANDLE (INSTRUMENTS) ×1
SUT PDS AB 1 TP1 96 (SUTURE) ×4 IMPLANT
SUT VIC AB 2-0 SH 18 (SUTURE) ×2 IMPLANT
SUT VIC AB 3-0 SH 18 (SUTURE) ×2 IMPLANT
SUT VICRYL AB 2 0 TIES (SUTURE) ×2 IMPLANT
SUT VICRYL AB 3 0 TIES (SUTURE) ×2 IMPLANT
TOWEL GREEN STERILE (TOWEL DISPOSABLE) ×2 IMPLANT
TOWEL GREEN STERILE FF (TOWEL DISPOSABLE) ×2 IMPLANT
TRAY FOLEY MTR SLVR 16FR STAT (SET/KITS/TRAYS/PACK) IMPLANT
YANKAUER SUCT BULB TIP NO VENT (SUCTIONS) IMPLANT

## 2023-02-26 NOTE — Consult Note (Signed)
Stephen Flynn, MRN:  [id number], DOB:  1928/06/18, LOS: 1 ADMISSION DATE:  02/25/2023, CONSULTATION DATE: 8/26 REFERRING MD: Dr. Randol Kern, CHIEF COMPLAINT: Hypotension  History of Present Illness:  Patient is encephalopathic and/or intubated; therefore, history has been obtained from chart review.  89 year old male with past medical history as below, which is significant for atrial fibrillation on Eliquis, wheelchair-bound, HFpEF, and stroke who presented to [facility] emergency department on 8/25 with complaints of acute onset abdominal pain with no bowel movement for 3 days prior to presentation.  Also sounds like he had not been consistently taking his Eliquis.  Upon arrival to the emergency department the patient was noted to be in atrial fibrillation with RVR to the 120s.  Started on diltiazem infusion and placed on heparin drip.  CT of the abdomen and pelvis demonstrated small bowel obstruction and moderate ascites.  General surgery was consulted and recommended conservative management with NG tube decompression.  He was admitted to the hospitalist service.  In the a.m. hours of 8/26 the patient developed waning mental status and became nonverbal.  He also developed hypotension with systolic pressures in the 70s and desaturation to the 80s.  His abdomen became more firm and rigid and exquisitely tender to pain.  The surgical team was at bedside and determined the patient would likely require urgent surgery.  PCCM was consulted for hypotension and hypoxia.  Pertinent  Medical History   has a past medical history of Anemia, Aortic insufficiency, Chest pain, H/O: GI bleed, Hyperlipidemia, Hypertension, Leg cramps, and Stroke (HCC).   Significant Hospital Events: Including procedures, antibiotic start and stop dates in addition to other pertinent events   8/25 admit for SBO 8/26 acute abdomen, tx to ICU for pressors.   Interim History / Subjective:    Objective   Blood pressure 92/69,  pulse 81, temperature 97.7 F (36.5 C), temperature source Oral, resp. rate 16, SpO2 92%.        Intake/Output Summary (Last 24 hours) at 02/26/2023 2841 Last data filed at 02/26/2023 0400 Gross per 24 hour  Intake 0 ml  Output --  Net 0 ml   There were no vitals filed for this visit.  Examination: General: Elderly male in bed HENT: Normocephalic, atraumatic, PERRL, no JVD Lungs: Diminished bases anteriorly, labored respiratory pattern Cardiovascular: Regular rate and rhythm Abdomen: Distended, firm, guarding with even minor palpation. Extremities: 1-2+ pretibial pitting edema bilaterally Neuro: Awake, no verbal response   Resolved Hospital Problem list     Assessment & Plan:   Small bowel obstruction:  Acute abdomen: -Initially recommended conservative management which she has failed -Surgery following and planning urgent trip to the operating room -Need to establish goals of care prior to proceeding with surgery as his prognosis following such an admission and recovery would be quite poor -Continue nasogastric tube to low intermittent suction  Septic shock -Transferred to ICU -Peripheral norepinephrine for MAP goal 65 -Continued volume resuscitation -Trend lactic acid - Zosyn to cover for intraabdominal catastrophe  Hypoxia: - Possibly aspirated - Zosyn to cover aspiration and above  Atrial fibrillation with rapid ventricular response -Diltiazem infusion stopped in the setting of shock -Heparin in lieu of home Eliquis, on hold for now pending surgery -Can use amiodarone heart rate rises above 130 and A-fib  Chronic HFpEF: LVEF 55 to 60% on last echocardiogram -Telemetry monitoring -Careful with volume -Updated echocardiogram pending  Diabetes mellitus -CBG monitoring and sliding scale insulin  Baseline debility: Wheelchair-bound -Need to establish goals  of care today prior to surgery/aggressive ICU interventions  History of stroke -Can resume home  therapies if he survives acute period of his illness   Best Practice (right click and "Reselect all SmartList Selections" daily)   Diet/type: NPO DVT prophylaxis: systemic heparin GI prophylaxis: PPI Lines: N/A Foley:  N/A Code Status:  full code Last date of multidisciplinary goals of care discussion [ ]   Labs   CBC: Recent Labs  Lab 02/25/23 1433 02/25/23 1459 02/26/23 0818  WBC 5.2  --  4.4  NEUTROABS 3.1  --   --   HGB 10.0* 10.9* 13.2  HCT 31.0* 32.0* 41.2  MCV 97.8  --  98.1  PLT 304  --  356    Basic Metabolic Panel: Recent Labs  Lab 02/25/23 1433 02/25/23 1459 02/26/23 0431  NA 139 140 138  K 3.0* 3.1* 4.2  CL 104 104 102  CO2 23  --  24  GLUCOSE 160* 159* 160*  BUN 11 11 15   CREATININE 0.88 0.70 1.17  CALCIUM 9.3  --  9.2  MG 1.8  --  2.2  PHOS  --   --  4.0   GFR: CrCl cannot be calculated (Unknown ideal weight.). Recent Labs  Lab 02/25/23 1433 02/25/23 1800 02/25/23 2011 02/26/23 0818  WBC 5.2  --   --  4.4  LATICACIDVEN  --  2.6* 2.6*  --     Liver Function Tests: Recent Labs  Lab 02/25/23 1433 02/26/23 0431  AST 14* 13*  ALT 11 9  ALKPHOS 44 47  BILITOT 0.8 0.5  PROT 7.0 6.6  ALBUMIN 3.2* 2.8*   Recent Labs  Lab 02/25/23 1433  LIPASE 16   No results for input(s): "AMMONIA" in the last 168 hours.  ABG    Component Value Date/Time   TCO2 25 02/25/2023 1459     Coagulation Profile: No results for input(s): "INR", "PROTIME" in the last 168 hours.  Cardiac Enzymes: No results for input(s): "CKTOTAL", "CKMB", "CKMBINDEX", "TROPONINI" in the last 168 hours.  HbA1C: Hgb A1c MFr Bld  Date/Time Value Ref Range Status  02/25/2023 08:11 PM 6.5 (H) 4.8 - 5.6 % Final    Comment:    (NOTE) Pre diabetes:          5.7%-6.4%  Diabetes:              >6.4%  Glycemic control for   <7.0% adults with diabetes   03/22/2019 04:08 AM 7.4 (H) 4.8 - 5.6 % Final    Comment:    (NOTE) Pre diabetes:          5.7%-6.4% Diabetes:               >6.4% Glycemic control for   <7.0% adults with diabetes     CBG: Recent Labs  Lab 02/25/23 1959 02/25/23 2344 02/26/23 0434 02/26/23 0806  GLUCAP 157* 175* 164* 146*    Review of Systems:   Patient is encephalopathic and/or intubated; therefore, history has been obtained from chart review.    Past Medical History:  He,  has a past medical history of Anemia, Aortic insufficiency, Chest pain, H/O: GI bleed, Hyperlipidemia, Hypertension, Leg cramps, and Stroke (HCC).   Surgical History:   Past Surgical History:  Procedure Laterality Date   CARDIOVASCULAR STRESS TEST  09/04/2005   US ECHOCARDIOGRAPHY  11/26/2007   ef 55-60%     Social History:   reports that he has quit smoking. He has never used smokeless tobacco. He  reports that he does not drink alcohol and does not use drugs.   Family History:  His family history is not on file.   Allergies Allergies  Allergen Reactions   Penicillin G     Other reaction(s): Drowsy   Penicillins Hives    Did it involve swelling of the face/tongue/throat, SOB, or low BP? No Did it involve sudden or severe rash/hives, skin peeling, or any reaction on the inside of your mouth or nose? yes Did you need to seek medical attention at a hospital or doctor's office? yes When did it last happen?  child     If all above answers are "NO", may proceed with cephalosporin use.     Home Medications  Prior to Admission medications   Medication Sig Start Date End Date Taking? Authorizing Provider  amLODipine (NORVASC) 5 MG tablet Take 1 tablet by mouth daily.   Yes [provider]  XARELTO 20 MG TABS tablet Take 20 mg by mouth daily with supper. 01/09/23  Yes [provider]     Critical care time: 43 minutes     Joneen Roach, AGACNP-BC [facility]  See Amion for personal pager PCCM on call pager [phone] until 7pm. Please call Elink 7p-7a. [phone]  02/26/2023 9:45  AM

## 2023-02-26 NOTE — Progress Notes (Signed)
OT Cancellation Note  Patient Details Name: Stephen Flynn MRN: [id number] DOB: 09-18-1927   Cancelled Treatment:    Reason Eval/Treat Not Completed: Medical issues which prohibited therapy;Patient not medically ready (Medical decline, pt being transferred to ICU.)  [name] 02/26/2023, 9:49 AM

## 2023-02-26 NOTE — Progress Notes (Signed)
Pt admit [name] [id number] Admission Data: 02/26/2023 10:55 AM Attending Provider: Starleen Arms, MD  OEV:OJJKK, Jonny Ruiz, MD Consults/ Treatment Team: Treatment Team:  Montez Morita, Md, MD  Stephen Flynn is a 89 y.o. male patient alert  & orientated  X 2,  Full Code, VSS - Blood pressure 108/78, pulse 86, temperature (!) 96.9 F (36.1 C), temperature source Axillary, resp. rate 15, weight 104.3 kg, SpO2 94%., O2  2 L nasal cannular, . Tele # [id number] placed and pt is currently running: Afib    IV site WDL:  forearm right, condition patent and no redness with a transparent dsg that's clean dry and intact.  Pt admitted to 10M room 13,  Skin, clean-dry- intact without evidence of bruising, or skin tears.   No evidence of skin break down noted on exam.    Will cont to monitor and assist as needed.  Marilynne Dupuis Sherlynn [place], [place] 02/26/2023 10:55 AM

## 2023-02-26 NOTE — Progress Notes (Signed)
Patient seen and examined at bedside with general surgery and primary. Nonverbal this morning, appears confused. Hypotensive. Dilt gtt discontinued.   BP 99/80   Pulse 100   Temp 97.7 F (36.5 C) (Oral)   Resp (!) 22   SpO2 92%   Awake, confused, not answering questions.  Tenderness to percussion and palpation.  Minimal NG output. Rhonchi bilaterally, mildly increased work of breathing.  Currently remains full code; family coming in. Transfer to the ICU Peripheral norepinephrine ordered, additional fluid being given Family coming in to discuss goals of care.  I agree with surgery that this surgery would likely be indicated if family wishes for aggressive care, but his chances of having a good recovery after surgery are very low.  High morbidity & mortality associated with this illness in his age group.  Full consult note to follow.   Steffanie Dunn, DO 02/26/23 9:12 AM [facility]  For contact information, see Amion. If no response to pager, please call PCCM consult pager. After hours, 7PM- 7AM, please call Elink.

## 2023-02-26 NOTE — Progress Notes (Signed)
Peripherally Inserted Central Catheter Placement  The IV Nurse has discussed with the patient and/or persons authorized to consent for the patient, the purpose of this procedure and the potential benefits and risks involved with this procedure.  The benefits include less needle sticks, lab draws from the catheter, and the patient may be discharged home with the catheter. Risks include, but not limited to, infection, bleeding, blood clot (thrombus formation), and puncture of an artery; nerve damage and irregular heartbeat and possibility to perform a PICC exchange if needed/ordered by physician.  Alternatives to this procedure were also discussed.  Bard Power PICC patient education guide, fact sheet on infection prevention and patient information card has been provided to patient /or left at bedside.  explanation given to wife, daughter and family present.  Family/wife designated daughter to sign consent.  Patient with altered mental status.  PICC Placement Documentation  PICC Triple Lumen 02/26/23 Right Brachial 41 cm 0 cm (Active)  Indication for Insertion or Continuance of Line Vasoactive infusions;Administration of hyperosmolar/irritating solutions (i.e. TPN, Vancomycin, etc.) 02/26/23 1851  Exposed Catheter (cm) 0 cm 02/26/23 1851  Site Assessment Clean, Dry, Intact 02/26/23 1851  Lumen #1 Status Flushed;Saline locked;Blood return noted 02/26/23 1851  Lumen #2 Status Flushed;Saline locked;Blood return noted 02/26/23 1851  Lumen #3 Status Flushed;Saline locked;Blood return noted 02/26/23 1851  Dressing Type Transparent;Securing device 02/26/23 1851  Dressing Status Antimicrobial disc in place;Clean, Dry, Intact 02/26/23 1851  Line Adjustment (NICU/IV Team Only) No 02/26/23 1851  Dressing Intervention New dressing 02/26/23 1851  Dressing Change Due 03/05/23 02/26/23 1851       Maylen Waltermire, Lajean Manes 02/26/2023, 6:52 PM

## 2023-02-26 NOTE — Anesthesia Preprocedure Evaluation (Addendum)
Anesthesia Evaluation  Patient identified by MRN, date of birth, ID band Patient awake  General Assessment Comment:Pt somnolent  Reviewed: Allergy & Precautions, NPO status , Patient's Chart, lab work & pertinent test results  History of Anesthesia Complications Negative for: history of anesthetic complications  Airway Mallampati: II  TM Distance: >3 FB Neck ROM: Full    Dental  (+) Dental Advisory Given   Pulmonary former smoker   breath sounds clear to auscultation       Cardiovascular hypertension, Pt. on medications + dysrhythmias Atrial Fibrillation  Rhythm:Irregular Rate:Normal  Requiring Levophed BP support today  ECHO today 02/26/2023  1. EF 40 to 45%. The LV has mildly decreased function, global hypokinesis. There is moderate concentric LVH. Left ventricular diastolic function could not be evaluated.  2. RVF is normal. The right ventricular size is normal. There is normal pulmonary artery systolic pressure. The estimated right ventricular systolic pressure is 31.7 mmHg.   3. Left atrial size was mildly dilated.   4. The mitral valve is degenerative. Trivial MR. No evidence of mitral stenosis.   5. The aortic valve is tricuspid. Aortic valve regurgitation is mild. Aortic valve sclerosis/calcification is present, without any evidence of aortic stenosis. Aortic valve area, by VTI measures 2.28 cm. Aortic valve mean gradient measures 5.0 mmHg.  Aortic valve Vmax measures 1.41 m/s.     Neuro/Psych CVA    GI/Hepatic Neg liver ROS,GERD (has NG)  Medicated,,SBO: ischemic bowel   Endo/Other  diabetes (glu 114), Insulin Dependent    Renal/GU negative Renal ROS     Musculoskeletal  (+) Arthritis ,    Abdominal   Peds  Hematology  plt 356k, Hb 13.2   Anesthesia Other Findings   Reproductive/Obstetrics                             Anesthesia Physical Anesthesia Plan  ASA: 4  Anesthesia  Plan: General   Post-op Pain Management:    Induction: Intravenous and Rapid sequence  PONV Risk Score and Plan: 2 and Treatment may vary due to age or medical condition  Airway Management Planned: Oral ETT  Additional Equipment: Arterial line  Intra-op Plan:   Post-operative Plan: Post-operative intubation/ventilation  Informed Consent: I have reviewed the patients History and Physical, chart, labs and discussed the procedure including the risks, benefits and alternatives for the proposed anesthesia with the patient or authorized representative who has indicated his/her understanding and acceptance.     Consent reviewed with POA  Plan Discussed with: CRNA and Surgeon  Anesthesia Plan Comments: (Discussed with pt's wife by telephone, she understands his condition is critical and that he will remain intubated and ventilated, ICU care post op)        Anesthesia Quick Evaluation

## 2023-02-26 NOTE — Progress Notes (Signed)
Subjective: Walked in to see the patient and he is lethargic.  He opens his eyes, but he is unable to speak.  He will nod his head at times to yes and no questions.  Nods yes to SOB.  Nods yes to abdominal pain.  No to flatus or BM.  He is hypotensive in the 70-80s systolic.  Sats aren't with a great waveform, but mostly in the 80s.  Heparin gtt turned off at 900am.  Called primary service to come see patient as well while I was present and ultimately CCM who saw him as well while I was present   Objective: Vital signs in last 24 hours: Temp:  [97.5 F (36.4 C)-97.9 F (36.6 C)] 97.7 F (36.5 C) (08/26 0000) Pulse Rate:  [64-135] 81 (08/26 0915) Resp:  [15-25] 16 (08/26 0915) BP: (92-168)/(69-153) 92/69 (08/26 0915) SpO2:  [87 %-100 %] 92 % (08/26 0915) Weight:  [104.3 kg] 104.3 kg (08/26 0915)    Intake/Output from previous day: No intake/output data recorded. Intake/Output this shift: No intake/output data recorded.  PE: Gen: ill-appearing, elderly Heart: irregular Lungs: O2 sats in the high 80s on O2 Abd: distended, very tender and guards to minimal palpation, NGT with no output.  This has flushed with air and water to confirm. Psych: difficult to assess.  He nods appropriately at times, but he is unable to answer and truly engage with a conversation.  Lab Results:  Recent Labs    02/25/23 1433 02/25/23 1459 02/26/23 0818  WBC 5.2  --  4.4  HGB 10.0* 10.9* 13.2  HCT 31.0* 32.0* 41.2  PLT 304  --  356   BMET Recent Labs    02/25/23 1433 02/25/23 1459 02/26/23 0431  NA 139 140 138  K 3.0* 3.1* 4.2  CL 104 104 102  CO2 23  --  24  GLUCOSE 160* 159* 160*  BUN 11 11 15   CREATININE 0.88 0.70 1.17  CALCIUM 9.3  --  9.2   PT/INR No results for input(s): "LABPROT", "INR" in the last 72 hours. CMP     Component Value Date/Time   NA 138 02/26/2023 0431   K 4.2 02/26/2023 0431   CL 102 02/26/2023 0431   CO2 24 02/26/2023 0431   GLUCOSE 160 (H)  02/26/2023 0431   BUN 15 02/26/2023 0431   CREATININE 1.17 02/26/2023 0431   CALCIUM 9.2 02/26/2023 0431   PROT 6.6 02/26/2023 0431   ALBUMIN 2.8 (L) 02/26/2023 0431   AST 13 (L) 02/26/2023 0431   ALT 9 02/26/2023 0431   ALKPHOS 47 02/26/2023 0431   BILITOT 0.5 02/26/2023 0431   GFRNONAA 58 (L) 02/26/2023 0431   GFRAA >60 05/16/2019 1110   Lipase     Component Value Date/Time   LIPASE 16 02/25/2023 1433       Studies/Results: DG Abd Portable 1V-Small Bowel Obstruction Protocol-initial, 8 hr delay  Result Date: 02/26/2023 CLINICAL DATA:  8 hour delayed imaging for small bowel obstruction. EXAM: PORTABLE ABDOMEN - 1 VIEW COMPARISON:  02/24/2023 FINDINGS: Enteric contrast material is identified within the gastric fundus. Dilute contrast material opacifies the dilated proximal small bowel loops within the left hemiabdomen. No enteric contrast material identified within the colon. IV contrast material from previous CT scan is now seen within the urinary bladder. IMPRESSION: There is a dilute enteric contrast material identified within the previously noted dilated proximal small bowel loops. No enteric contrast material within the colon. Continued serial imaging of  the abdomen advised with next radiograph in 8 hours. Electronically Signed   By: Signa Kell M.D.   On: 02/26/2023 05:44   DG Abd Portable 1 View  Result Date: 02/25/2023 CLINICAL DATA:  NG placement.  Small-bowel obstruction. EXAM: PORTABLE ABDOMEN - 1 VIEW COMPARISON:  CT abdomen pelvis dated 02/25/2023. FINDINGS: Partially visualized enteric tube with tip in the left upper abdomen, likely in the region of the gastric fundus. The side port of the enteric tube is in the proximal stomach. IMPRESSION: Enteric tube with tip in the region of the gastric fundus. Electronically Signed   By: Elgie Collard M.D.   On: 02/25/2023 19:42   CT ABDOMEN PELVIS W CONTRAST  Result Date: 02/25/2023 CLINICAL DATA:  Acute abdominal pain.  EXAM: CT ABDOMEN AND PELVIS WITH CONTRAST TECHNIQUE: Multidetector CT imaging of the abdomen and pelvis was performed using the standard protocol following bolus administration of intravenous contrast. RADIATION DOSE REDUCTION: This exam was performed according to the departmental dose-optimization program which includes automated exposure control, adjustment of the mA and/or kV according to patient size and/or use of iterative reconstruction technique. CONTRAST:  75mL OMNIPAQUE IOHEXOL 350 MG/ML SOLN COMPARISON:  CT 06/02/2019 FINDINGS: Lower chest: The heart is enlarged.  Patulous distal esophagus. Hepatobiliary: Scattered subcentimeter hypodense liver lesions are stable from 2020 exam. No evidence of new liver lesion. Gallbladder physiologically distended, no calcified stone. No biliary dilatation. Pancreas: Pancreatic atrophy.  No ductal dilatation or inflammation. Spleen: Normal in size without focal abnormality. Adrenals/Urinary Tract: No adrenal nodule. No hydronephrosis. Simple cyst arises from the upper pole of the right kidney. Smaller simple cyst in the upper left kidney. No further follow-up imaging is recommended. No renal calculi or suspicious renal lesion. Bladder is physiologically distended. Equivocal bladder wall thickening. Stomach/Bowel: Dilated fluid-filled stomach. Dilated fluid-filled small bowel in the left mid abdomen. Transition point in the central abdomen series 3, image 45. The more distal small bowel is decompressed. There is prominent mesenteric edema and free fluid. No bowel pneumatosis. Small to moderate volume stool in the colon. The sigmoid colon is redundant. The appendix is not definitively seen. Vascular/Lymphatic: Aortic atherosclerosis. No aneurysm. Portal vein is patent. No portal venous or mesenteric gas. Calcified nodule in the left small bowel mesentery may represent a calcified lymph node. Reproductive: Enlarged prostate gland causing mass effect on the bladder base.  Other: Moderate abdominopelvic ascites. Mesenteric edema primarily in the left abdomen. Small amount of ascites in the left inguinal canal. No free air. Left flank subcutaneous lipoma. Musculoskeletal: Advanced right hip arthropathy. Diffuse lumbar degenerative change. There are no acute or suspicious osseous abnormalities. IMPRESSION: 1. Small-bowel obstruction with transition point in the central abdomen. There is prominent mesenteric edema and free fluid. The degree of mesenteric inflammation can predispose to ischemia. Recommend correlation with lactate. 2. Moderate abdominopelvic ascites. 3. Enlarged prostate gland causing mass effect on the bladder base. Equivocal bladder wall thickening. Aortic Atherosclerosis (ICD10-I70.0). Electronically Signed   By: Narda Rutherford M.D.   On: 02/25/2023 16:56   DG Chest Port 1 View  Result Date: 02/25/2023 CLINICAL DATA:  Acute onset chest pain 1 hour ago. Shortness of breath. EXAM: PORTABLE CHEST 1 VIEW COMPARISON:  12/15/2022 FINDINGS: Stable cardiomegaly and ectasia of thoracic aorta. Low lung volumes noted, however both lungs are clear. IMPRESSION: Low lung volumes. No active lung disease. Electronically Signed   By: Danae Orleans M.D.   On: 02/25/2023 15:31    Anti-infectives: Anti-infectives (From admission, onward)  Start     Dose/Rate Route Frequency Ordered Stop   02/26/23 1015  meropenem (MERREM) 1 g in sodium chloride 0.9 % 100 mL IVPB        1 g 200 mL/hr over 30 Minutes Intravenous  Once 02/26/23 1610          Assessment/Plan SBO, concerned for ischemia - labs are all relatively unremarkable, but patient appears ill.  His abdomen is very tender with essentially peritonitis.  BP is low and CCM going to start pressor support.  SOB with decrease O2 on some oxygen. - heparin stopped at 0900am - Cardizem stopped as well due to low BP - reviewed repeat x-ray this morning.  No overall improvement - clinically I'm very concerned the patient  has a significant process going on in his abdomen that is making him very ill - I have spoken to the daughter on the phone who is getting her mother and they will be here within the hour so we can discuss how to proceed.  I am very concerned this patient will die whether he has an operation or not.  At baseline he is wheelchair/walker bound with multiple comorbidities.  I think his post op course would be fraught with complications that would ultimately lead to his death.  Once the family arrives we will continue to discuss all of this so we can determine if they want to pursue surgical intervention or plan for comfort care. - discussed with primary service/CCM service who are at the bedside with me.  I will return shortly to be speak to the family upon their arrival. - repeat lactic acid is pending.     FEN - NPO/NGT/IVFs VTE - heparin, held at 0900am ID - starting zosyn  A fib Wheelchair/walker bound HTN H/O CVA HLD Diastolic CHF DM   I reviewed hospitalist notes, last 24 h vitals and pain scores, last 48 h intake and output, last 24 h labs and trends, last 24 h imaging results, and discussed with nursing, primary, and CCM at bedside .   LOS: 1 day    Letha Cape , [facility] Surgery 02/26/2023, 9:26 AM Please see Amion for pager number during day hours 7:00am-4:30pm or 7:00am -11:30am on weekends

## 2023-02-26 NOTE — Progress Notes (Signed)
PCCM INTERVAL PROGRESS NOTE  Met with multiple family members involved in the care of Stephen Flynn. They understand he has limited options including high risk surgery and ICU stay vs more of a conservative/comfort focus. They accept the risks of prolonged mechanical ventilation and worsening debility as a likely best case scenario. They would like to proceed with surgery and continue aggressive management.    Joneen Roach, AGACNP-BC [facility]  See Amion for personal pager PCCM on call pager [phone] until 7pm. Please call Elink 7p-7a. [phone]  02/26/2023 12:36 PM

## 2023-02-26 NOTE — Progress Notes (Signed)
Further discussion with the family has been completed.  I have discussed the surgery as well as potential risks and complications.  Please see previous note for ACS risk calculator that was discussed with the family and a copy was given to them as well.  They have had a chance to meet with the patient as well as the critical care team.  The family would like to pursue surgical intervention.  We did also discuss the risk of an open abdomen as well as the risk of no intervention if we open his abdomen and his the majority of his intestine is ischemic.  They understand and would like to proceed understanding his prognosis is likely very poor.  Letha Cape 11:27 AM 02/26/2023

## 2023-02-26 NOTE — Op Note (Signed)
Preoperative diagnosis: Small bowel obstruction  Postoperative diagnosis small bowel obstruction involving a medial adhesion across the distal jejunum  Procedure: Exploratory laparotomy small bowel resection and primary anastomosis  Surgeon: Harriette Bouillon, MD  Assistant: Barnetta Chapel, PA-C  Anesthesia: General  EBL: 40 cc  Specimen: Distal jejunum and proximal ileum to pathology.  Approximately 15 cm of distal ileum in place and 100 cm of jejunum in place  Drains: None  Indications for procedure: The patient is a 89 year old male who was admitted with small bowel obstruction of the weekend.  He has progressed with increasing pain.  Given his advanced age a long conversation was had with the family about the pros and cons of surgical intervention and potential postoperative condition afterwards.  Also reviewed increased incidence of mortality in this case which is documented in the H&P.,  Palliative care.  Family felt that he was functional enough that they wish to proceed with laparotomy.  He was listed on ground and had significant disability but had a reasonable quality of life according to the family.  They understood is quite likely be vastly different to surgery and that he may not even survive surgery altogether.  After reviewing all the possibilities they wish to proceed with laparotomy.The procedure has been discussed with the patient.  Alternative therapies have been discussed with the patient.  Operative risks include bleeding,  Infection,  Organ injury,  Nerve injury,  Blood vessel injury,  DVT,  Pulmonary embolism,  Death,  And possible reoperation.  Medical management risks include worsening of present situation.  The success of the procedure is 50 -90 % at treating patients symptoms.  The patient understands and agrees to proceed.    Description of procedure: The patient was met in the ICU.  He was taken directly from the ICU to the operating room due to hemodynamic instability.   He was placed supine upon the operative table.  After induction of general anesthesia, the abdomen was prepped and draped in sterile fashion and timeout performed.  He was already antibiotics.  A lower midline incision was used.  Dissection was carried down to the linea alba and this was opened in the midline.  There is over a liter of ascites that was suctioned out.  This is cloudy.  We did extend the incision and placed a retractor.  There is significant dead small bowel.  The ligament of Treitz was identified.  The small bowel was run distally until adhesive band was encountered which was the omentum which was stuck in the pelvis.  This was divided by the LigaSure.  This was his point of obstruction.  Distal to that the small bowel showed severe ischemic changes annular crisis.  This followed to the ligament of Treves.  At about 15 cm from the ileocecal valve the bowel pinked up and was viable.  Small bowel was resected from the distal jejunum to the distal ileum.  Overall he had approximately 120 cm of small bowel remaining.  Anastomosis was then created using a GIA 75 stapler and a side-to-side anastomosis was created with multiple fires of this.  TXA 60 used to close the common enterotomy.  Anastomosis is widely patent.  There is no undue tension.  Stable was oversewn with 3-0 Vicryl.  The mesenteric defect was closed with 2-0 Vicryl.  Reexamination anastomosis showed no evidence of leakage.  Contents were milked across the anastomosis and milked easily.  There is no evidence of leakage.  Irrigation was used and suctioned out.  NG tube position in the stomach.  Otherwise, no other abnormalities noted upon examination of the abdominal cavity.  Fascia reapproximated with #1 PDS.  Skin packed open.  Dry dressings were applied over this.  Patient was then taken intubated back to the ICU in stable condition.  All counts were correct.

## 2023-02-26 NOTE — Transfer of Care (Signed)
Immediate Anesthesia Transfer of Care Note  Patient: Stephen Flynn  Procedure(s) Performed: EXPLORATION LAPAROTOMY WITH POSSIBLE BOWEL RESECTION  Patient Location: ICU  Anesthesia Type:General  Level of Consciousness: sedated and Patient remains intubated per anesthesia plan  Airway & Oxygen Therapy: Patient remains intubated per anesthesia plan and Patient placed on Ventilator (see vital sign flow sheet for setting)  Post-op Assessment: Report given to RN and Post -op Vital signs reviewed and stable  Post vital signs: Reviewed and stable  Last Vitals:  Vitals Value Taken Time  BP    Temp    Pulse 80 02/26/23 1541  Resp 20 02/26/23 1541  SpO2 100 % 02/26/23 1541  Vitals shown include unfiled device data.  Last Pain:  Vitals:   02/26/23 1008  TempSrc: Axillary  PainSc:          Complications: No notable events documented.

## 2023-02-26 NOTE — Plan of Care (Signed)

## 2023-02-26 NOTE — Progress Notes (Signed)
PCCM INTERVAL PROGRESS NOTE  Evaluated post-operatively. Appreciate general surgery assistance, see op note. He remains on the ventilator. Hemodynamically stable on NE.   BP 103/78 (BP Location: Left Arm)   Pulse 92   Temp (!) 96.9 F (36.1 C) (Axillary)   Resp 18   Ht 5\' 9"  (1.753 m)   Wt 104.3 kg Comment: ON 01/05/23  SpO2 94%   BMI 33.96 kg/m    Small bowel obstruction:  Acute abdomen: Ichemic bowel: s/p resection -s/p resection today. Approximately 120-150cm of small bowel remaining.  -Management per surgery -NPO -Continue nasogastric tube to low intermittent suction  Septic shock -Peripheral norepinephrine for MAP goal 65 -Continued volume resuscitation -Asking PICC team to assist with central access.  -Cefepime/flagyl to cover for intraabdominal infection -Send routine lab panel  Acute respiratory failure with hypoxia: aspiration - Full vent support - ABG, CXR now - Precedex for sedation. Fentanyl for analgesia. RASS goal -1 to -2.    Atrial fibrillation with rapid ventricular response -Diltiazem infusion stopped in the setting of shock -Amiodarone started in OR, continue.  -Holding heparin infusion overnight, restart 8/27 0600  Critical care time 28 minutes.   Joneen Roach, AGACNP-BC [facility]  See Amion for personal pager PCCM on call pager [phone] until 7pm. Please call Elink 7p-7a. [phone]  02/26/2023 3:53 PM

## 2023-02-26 NOTE — Anesthesia Procedure Notes (Addendum)
Arterial Line Insertion Start/End8/26/2024 2:05 PM, 02/26/2023 2:08 PM Performed by: Nils Pyle, CRNA, CRNA  Patient location: Pre-op. Preanesthetic checklist: patient identified, IV checked, site marked, risks and benefits discussed, surgical consent, monitors and equipment checked, pre-op evaluation and anesthesia consent Lidocaine 1% used for infiltration Left, radial was placed Catheter size: 20 G Hand hygiene performed  and maximum sterile barriers used   Attempts: 1 Procedure performed without using ultrasound guided technique. Following insertion, dressing applied and Biopatch. Post procedure assessment: normal and unchanged  Patient tolerated the procedure well with no immediate complications.

## 2023-02-26 NOTE — Progress Notes (Signed)
Pts ring was given to the granddaughter.

## 2023-02-26 NOTE — Progress Notes (Addendum)
ANTICOAGULATION CONSULT NOTE - Initial Consult  Pharmacy Consult for heparin Indication: atrial fibrillation  Allergies  Allergen Reactions   Penicillin G     Other reaction(s): Drowsy   Penicillins Hives    Did it involve swelling of the face/tongue/throat, SOB, or low BP? No Did it involve sudden or severe rash/hives, skin peeling, or any reaction on the inside of your mouth or nose? yes Did you need to seek medical attention at a hospital or doctor's office? yes When did it last happen?  child     If all above answers are "NO", may proceed with cephalosporin use.    Patient Measurements: Height: 5\' 9"  (175.3 cm) Weight: 104.3 kg (229 lb 15 oz) (ON 01/05/23) IBW/kg (Calculated) : 70.7 Heparin Dosing Weight: 89kg  Vital Signs: Temp: 96.9 F (36.1 C) (08/26 1008) Temp Source: Axillary (08/26 1008) BP: 130/94 (08/26 1615) Pulse Rate: 73 (08/26 1615)  Labs: Recent Labs    02/25/23 1433 02/25/23 1459 02/26/23 0431 02/26/23 0818 02/26/23 0907 02/26/23 1440  HGB 10.0* 10.9*  --  13.2  --  10.9*  HCT 31.0* 32.0*  --  41.2  --  32.0*  PLT 304  --   --  356  --   --   APTT  --   --  49*  --  72*  --   HEPARINUNFRC  --   --  0.20*  --  0.35  --   CREATININE 0.88 0.70 1.17  --   --   --   TROPONINIHS 13  --   --   --   --   --     Estimated Creatinine Clearance: 46.9 mL/min (by C-G formula based on SCr of 1.17 mg/dL).   Medical History: Past Medical History:  Diagnosis Date   Anemia    Aortic insufficiency    Chest pain    H/O: GI bleed    Hyperlipidemia    Hypertension    Leg cramps    Stroke [facility])     Assessment: 28 YOM presenting with SBO now s/p ex lap, small bowel resection and primary anastomosis 8/26. Patient was on xarelto PTA for afib with last dose taken last week sometime per pt report (previously switched from Eliquis to Xarelto for GI issues, however pt reports having more issues and stopping Xarelto a few days ago which he attributed to the drug).  Pharmacy consulted for heparin.  Per notes, hold heparin tonight and restart 8/27 at 0600. Patient was at low end of therapeutic with aPTT 72 and heparin level 0.35 on 1200 units/hr.    Goal of Therapy:  Heparin level 0.3-0.7 units/ml aPTT 66-102 seconds Monitor platelets by anticoagulation protocol: Yes   Plan:  Restart Heparin gtt at 1250 units/hr, no bolus at 0600 F/u 8 hour heparin level Monitor daily heparin level, CBC, signs/symptoms of bleeding  F/u ability to transition back to PO  Alphia Moh, PharmD, BCPS, [facility] Clinical Pharmacist  Please check AMION for all [facility] Pharm[facility] phone numbers After 10:00 PM, call Main Pharmacy [phone]

## 2023-02-26 NOTE — Progress Notes (Signed)
PT Cancellation Note  Patient Details Name: Stephen Flynn MRN: [id number] DOB: 09/20/1927   Cancelled Treatment:    Reason Eval/Treat Not Completed: Medical issues which prohibited therapy. Pt being transferred to ICU with concern for acute abdomen and need for pressors. PT will follow up when pt is more medically stable.   Arlyss Gandy 02/26/2023, 9:49 AM

## 2023-02-26 NOTE — Anesthesia Procedure Notes (Signed)
Procedure Name: Intubation Date/Time: 02/26/2023 2:04 PM  Performed by: Audie Pinto, CRNAPre-anesthesia Checklist: Patient identified, Emergency Drugs available, Suction available and Patient being monitored Patient Re-evaluated:Patient Re-evaluated prior to induction Oxygen Delivery Method: Circle system utilized Preoxygenation: Pre-oxygenation with 100% oxygen Induction Type: IV induction and Rapid sequence Laryngoscope Size: Mac and 4 Grade View: Grade II Tube type: Oral Tube size: 8.0 mm Number of attempts: 1 Airway Equipment and Method: Stylet and Oral airway Placement Confirmation: ETT inserted through vocal cords under direct vision, positive ETCO2 and breath sounds checked- equal and bilateral Secured at: 23 cm Tube secured with: Tape Dental Injury: Teeth and Oropharynx as per pre-operative assessment

## 2023-02-26 NOTE — Significant Event (Signed)
Rapid Response Event Note   Reason for Call :  Start vasopressor  BP 78/67  Initial Focused Assessment:  Patient is alert but mostly non verbal.  He is grunting and sometimes will say yes or no. He will move all extremities very weakly and is able to weakly cough.   Lung sounds rhonchi through out Heart tones irregular, AF with PVCx NG tube to LWS  BP 92/69  HR 90s-100s  RR 16  O2 sat 92-95%  on 5L    NS bolus infusing  Interventions:  Levophed gtt started at  Transferred to 2M13  Plan of Care:     Event Summary:   MD Notified: Dr Randol Kern at bedside Call Time: 0905 Arrival Time: 0908 End Time: 1010  Marcellina Millin, RN

## 2023-02-27 ENCOUNTER — Encounter (HOSPITAL_COMMUNITY): Payer: Self-pay | Admitting: Surgery

## 2023-02-27 DIAGNOSIS — K56609 Unspecified intestinal obstruction, unspecified as to partial versus complete obstruction: Secondary | ICD-10-CM | POA: Diagnosis not present

## 2023-02-27 LAB — BASIC METABOLIC PANEL
Anion gap: 12 (ref 5–15)
BUN: 23 mg/dL (ref 8–23)
CO2: 20 mmol/L — ABNORMAL LOW (ref 22–32)
Calcium: 8.6 mg/dL — ABNORMAL LOW (ref 8.9–10.3)
Chloride: 107 mmol/L (ref 98–111)
Creatinine, Ser: 1.37 mg/dL — ABNORMAL HIGH (ref 0.61–1.24)
GFR, Estimated: 48 mL/min — ABNORMAL LOW (ref >60)
Glucose, Bld: 147 mg/dL — ABNORMAL HIGH (ref 70–99)
Potassium: 4.1 mmol/L (ref 3.5–5.1)
Sodium: 139 mmol/L (ref 135–145)

## 2023-02-27 LAB — CBC
HCT: 27.1 % — ABNORMAL LOW (ref 39.0–52.0)
Hemoglobin: 9 g/dL — ABNORMAL LOW (ref 13.0–17.0)
MCH: 30.4 pg (ref 26.0–34.0)
MCHC: 33.2 g/dL (ref 30.0–36.0)
MCV: 91.6 fL (ref 80.0–100.0)
Platelets: 229 10*3/uL (ref 150–400)
RBC: 2.96 MIL/uL — ABNORMAL LOW (ref 4.22–5.81)
RDW: 14.8 % (ref 11.5–15.5)
WBC: 1.9 10*3/uL — ABNORMAL LOW (ref 4.0–10.5)
nRBC: 0 % (ref 0.0–0.2)

## 2023-02-27 LAB — LACTIC ACID, PLASMA: Lactic Acid, Venous: 1.4 mmol/L (ref 0.5–1.9)

## 2023-02-27 LAB — GLUCOSE, CAPILLARY
Glucose-Capillary: 124 mg/dL — ABNORMAL HIGH (ref 70–99)
Glucose-Capillary: 132 mg/dL — ABNORMAL HIGH (ref 70–99)
Glucose-Capillary: 132 mg/dL — ABNORMAL HIGH (ref 70–99)
Glucose-Capillary: 134 mg/dL — ABNORMAL HIGH (ref 70–99)
Glucose-Capillary: 139 mg/dL — ABNORMAL HIGH (ref 70–99)
Glucose-Capillary: 163 mg/dL — ABNORMAL HIGH (ref 70–99)

## 2023-02-27 LAB — HEPARIN LEVEL (UNFRACTIONATED)
Heparin Unfractionated: 0.4 [IU]/mL (ref 0.30–0.70)
Heparin Unfractionated: 0.4 [IU]/mL (ref 0.30–0.70)

## 2023-02-27 LAB — MAGNESIUM: Magnesium: 1.7 mg/dL (ref 1.7–2.4)

## 2023-02-27 MED ORDER — TRAVASOL 10 % IV SOLN
INTRAVENOUS | Status: AC
  Filled 2023-02-27: qty 556.8

## 2023-02-27 MED ORDER — MAGNESIUM SULFATE 2 GM/50ML IV SOLN
2.0000 g | Freq: Once | INTRAVENOUS | Status: AC
  Administered 2023-02-27: 2 g via INTRAVENOUS
  Filled 2023-02-27: qty 50

## 2023-02-27 MED ORDER — THIAMINE HCL 100 MG/ML IJ SOLN
100.0000 mg | Freq: Every day | INTRAMUSCULAR | Status: DC
  Administered 2023-02-27 – 2023-03-02 (×4): 100 mg via INTRAVENOUS
  Filled 2023-02-27 (×4): qty 2

## 2023-02-27 MED ORDER — ORAL CARE MOUTH RINSE: 15.0000 mL | OROMUCOSAL | Status: DC | PRN

## 2023-02-27 NOTE — Progress Notes (Signed)
Initial Nutrition Assessment  DOCUMENTATION CODES:   Non-severe (moderate) malnutrition in context of chronic illness  INTERVENTION:  TPN, management per pharmacy Recommend 100mg  of thiamine x 5 days be added to TPN  Monitor magnesium and phosphorus every 12 hours x 4 occurrences, MD to replete as needed, as pt is at risk for refeeding syndrome given malnutrition and decreased PO intake x 5 days  NUTRITION DIAGNOSIS:   Moderate Malnutrition related to chronic illness as evidenced by mild fat depletion, severe muscle depletion.  GOAL:   Patient will meet greater than or equal to 90% of their needs  MONITOR:   I & O's, Vent status, Labs, Weight trends  REASON FOR ASSESSMENT:   Ventilator    ASSESSMENT:   Pt with hx of HTN, HLD, CVA, CHF, DM type 2, atrial fibrillation, and AAA presented to ED with abdominal pain. Imaging showed a SBO. Initially treated conservatively but ultimately developed hypotension and was transferred to ICU and then taken to OR for repair.  8/25 - admitted with SBO 8/26 - Op, exploratory laparotomy with bowel resection, Small bowel was resected from the distal jejunum to the distal ileum. ~120cm of remaining bowel. PICC placed 8/27 - TPN to start   Patient is currently intubated on ventilator support. Per surgery, TPN to be initiated today. Significant bowel removed during surgery yesterday.   Wife and daughter present at bedside able to provide a nutrition hx.  Reports that at baseline, pt typically eats about 2 meals (sometimes 3) and will have a snack before bed. However, over the last two weeks wife estimates that pt has been consuming 50% of his usual intake. Pt noted to have severe muscle depletions and mild fat depletions to the upper body. Lower body edematous. Wife reports that pt commonly has swelling to his BLE and that currently they look similar to his baseline.   As pt has had decreased poor PO intake x 2 weeks and with signs of muscle on  fat depletion, feel that he is at high risk for refeeding with initiation of TPN. Discussed with pharmacy team during rounds.   MV: 7.4 L/min Temp (24hrs), Avg:96.4 F (35.8 C), Min:93.9 F (34.4 C), Max:99.9 F (37.7 C)   Intake/Output Summary (Last 24 hours) at 02/27/2023 1455 Last data filed at 02/27/2023 1300 Gross per 24 hour  Intake 4158.25 ml  Output 495 ml  Net 3663.25 ml  Net IO Since Admission: 4,163.25 mL [02/27/23 1455]  Nutritionally Relevant Medications: Scheduled Meds:  docusate  100 mg Per Tube BID   famotidine  20 mg Per Tube BID   insulin aspart  0-6 Units Subcutaneous Q4H   pantoprazole IV  40 mg Intravenous Q24H   polyethylene glycol  17 g Per Tube Daily   Continuous Infusions:  0.9 % NaCl with KCl 20 mEq / L Stopped (02/26/23 1136)   ceFEPime (MAXIPIME) IV Stopped (02/27/23 0240)   dexmedetomidine (PRECEDEX) IV infusion Stopped (02/27/23 8295)   metronidazole Stopped (02/27/23 0353)   norepinephrine (LEVOPHED) Adult infusion Stopped (02/26/23 1930)   PRN Meds: ondansetron  Labs Reviewed: Creatinine 1.37 CBG ranges from 111-139 mg/dL over the last 24 hours HgbA1c 6.5%  NUTRITION - FOCUSED PHYSICAL EXAM: Flowsheet Row Most Recent Value  Orbital Region Mild depletion  Upper Arm Region Mild depletion  Thoracic and Lumbar Region No depletion  Buccal Region Unable to assess  Temple Region Mild depletion  Clavicle Bone Region Severe depletion  Clavicle and Acromion Bone Region Severe depletion  Scapular Bone  Region Mild depletion  Dorsal Hand Unable to assess  Patellar Region Unable to assess  [edema]  Anterior Thigh Region Unable to assess  [edema]  Posterior Calf Region Unable to assess  [edema]  Edema (RD Assessment) Moderate  [BLE]  Hair Reviewed  Eyes Reviewed  Mouth Reviewed  [missing teeth]  Skin Reviewed  Nails Reviewed   Diet Order:   Diet Order             Diet NPO time specified  Diet effective now                    EDUCATION NEEDS:  Not appropriate for education at this time  Skin:  Skin Assessment: Reviewed RN Assessment (surgical incision, midline)  Last BM:  8/27  Height:  Ht Readings from Last 1 Encounters:  02/26/23 5\' 9"  (1.753 m)    Weight:  Wt Readings from Last 1 Encounters:  02/27/23 99.7 kg    Ideal Body Weight:  72.7 kg  BMI:  Body mass index is 32.46 kg/m.  Estimated Nutritional Needs:  Kcal:  1800-2000 kcal/d Protein:  90-105g/d Fluid:  1.8L/d    Greig Castilla, RD, LDN Clinical Dietitian RD pager # available in AMION  After hours/weekend pager # available in [facility]

## 2023-02-27 NOTE — Plan of Care (Signed)
  Problem: Education: Goal: Ability to describe self-care measures that may prevent or decrease complications (Diabetes Survival Skills Education) will improve Outcome: Progressing Goal: Individualized Educational Video(s) Outcome: Progressing   Problem: Coping: Goal: Ability to adjust to condition or change in health will improve Outcome: Progressing   Problem: Fluid Volume: Goal: Ability to maintain a balanced intake and output will improve Outcome: Progressing   Problem: Health Behavior/Discharge Planning: Goal: Ability to identify and utilize available resources and services will improve Outcome: Progressing Goal: Ability to manage health-related needs will improve Outcome: Progressing   Problem: Metabolic: Goal: Ability to maintain appropriate glucose levels will improve Outcome: Progressing   Problem: Nutritional: Goal: Maintenance of adequate nutrition will improve Outcome: Progressing Goal: Progress toward achieving an optimal weight will improve Outcome: Progressing   Problem: Skin Integrity: Goal: Risk for impaired skin integrity will decrease Outcome: Progressing   Problem: Tissue Perfusion: Goal: Adequacy of tissue perfusion will improve Outcome: Progressing   Problem: Education: Goal: Knowledge of General Education information will improve Description: Including pain rating scale, medication(s)/side effects and non-pharmacologic comfort measures Outcome: Progressing   Problem: Health Behavior/Discharge Planning: Goal: Ability to manage health-related needs will improve Outcome: Progressing   Problem: Clinical Measurements: Goal: Ability to maintain clinical measurements within normal limits will improve Outcome: Progressing Goal: Will remain free from infection Outcome: Progressing Goal: Diagnostic test results will improve Outcome: Progressing Goal: Respiratory complications will improve Outcome: Progressing Goal: Cardiovascular complication will  be avoided Outcome: Progressing   Problem: Activity: Goal: Risk for activity intolerance will decrease Outcome: Progressing   Problem: Nutrition: Goal: Adequate nutrition will be maintained Outcome: Progressing   Problem: Coping: Goal: Level of anxiety will decrease Outcome: Progressing   Problem: Elimination: Goal: Will not experience complications related to bowel motility Outcome: Progressing Goal: Will not experience complications related to urinary retention Outcome: Progressing   Problem: Pain Managment: Goal: General experience of comfort will improve Outcome: Progressing   Problem: Safety: Goal: Ability to remain free from injury will improve Outcome: Progressing   Problem: Skin Integrity: Goal: Risk for impaired skin integrity will decrease Outcome: Progressing   Problem: Activity: Goal: Ability to tolerate increased activity will improve Outcome: Progressing   Problem: Respiratory: Goal: Ability to maintain a clear airway and adequate ventilation will improve Outcome: Progressing   Problem: Role Relationship: Goal: Method of communication will improve Outcome: Progressing   Problem: Safety: Goal: Non-violent Restraint(s) Outcome: Progressing   

## 2023-02-27 NOTE — Procedures (Signed)
Extubation Procedure Note  Patient Details:   Name: Stephen Flynn DOB: [birth year] MRN: [id number]   Airway Documentation:    Vent end date: 02/27/23 Vent end time: 1519   Evaluation  O2 sats: stable throughout Complications: No apparent complications Patient did tolerate procedure well. Bilateral Breath Sounds: Clear, Diminished   Yes  Pt extubated to 4l [place] with RN and wife at bedside per MD order. Positive cuff leak noted and pt is tolerating well. RT will monitor.  Lajuan Lines 02/27/2023, 3:19 PM

## 2023-02-27 NOTE — Progress Notes (Signed)
PT Cancellation Note  Patient Details Name: Stephen Flynn MRN: [id number] DOB: Apr 11, 1928   Cancelled Treatment:    Reason Eval/Treat Not Completed: Patient not medically ready (pt remains intubated post-op, weaning and will hold in anticipation of possible extubation)   [name] [name] 02/27/2023, 8:16 AM [name], PT Acute Rehabilitation Services Office: [phone]

## 2023-02-27 NOTE — Progress Notes (Signed)
ANTICOAGULATION CONSULT NOTE - Initial Consult  Pharmacy Consult for heparin Indication: atrial fibrillation  Allergies  Allergen Reactions   Penicillin G     Other reaction(s): Drowsy   Penicillins Hives    Did it involve swelling of the face/tongue/throat, SOB, or low BP? No Did it involve sudden or severe rash/hives, skin peeling, or any reaction on the inside of your mouth or nose? yes Did you need to seek medical attention at a hospital or doctor's office? yes When did it last happen?  child     If all above answers are "NO", may proceed with cephalosporin use.    Patient Measurements: Height: 5\' 9"  (175.3 cm) Weight: 99.7 kg (219 lb 12.8 oz) IBW/kg (Calculated) : 70.7 Heparin Dosing Weight: 89kg  Vital Signs: Temp: 99.9 F (37.7 C) (08/27 1400) Temp Source: Esophageal (08/27 1300) BP: 99/78 (08/27 1400) Pulse Rate: 92 (08/27 1400)  Labs: Recent Labs    02/25/23 1433 02/25/23 1459 02/26/23 0431 02/26/23 0818 02/26/23 0907 02/26/23 1440 02/26/23 1554 02/26/23 1638 02/27/23 0311 02/27/23 1257  HGB 10.0*   < >  --  13.2  --    < > 9.7* 10.2* 9.0*  --   HCT 31.0*   < >  --  41.2  --    < > 30.2* 30.0* 27.1*  --   PLT 304  --   --  356  --   --  255  --  229  --   APTT  --   --  49*  --  72*  --   --   --   --   --   HEPARINUNFRC  --   --  0.20*  --  0.35  --   --   --   --  0.40  CREATININE 0.88   < > 1.17  --   --   --  1.29*  --  1.37*  --   TROPONINIHS 13  --   --   --   --   --   --   --   --   --    < > = values in this interval not displayed.    Estimated Creatinine Clearance: 39.2 mL/min (A) (by C-G formula based on SCr of 1.37 mg/dL (H)).   Medical History: Past Medical History:  Diagnosis Date   Anemia    Aortic insufficiency    Chest pain    H/O: GI bleed    Hyperlipidemia    Hypertension    Leg cramps    Stroke [facility])     Assessment: 66 YOM presenting with epigastric pain, on xarelto PTA for afib with last dose taken last week sometime  per pt report (previously switched from Eliquis to Xarelto for GI issues, however pt reports having more issues and stopping Xarelto a few days prior to admission which he attributed to the drug)  Based on initial heparin level/aPTTs, levels were correlating so will proceed with heparin levels. Heparin infusion was held for OR 8/26 and resumed 0600 on 8/27 at a rate of 1200. Initial heparin level after resumption is therapeutic at 0.4, no issues noted.  Goal of Therapy:  Heparin level 0.3-0.7 units/ml aPTT 66-102 seconds Monitor platelets by anticoagulation protocol: Yes   Plan:  Continue heparin gtt at 1200 units/hr F/u 8 hour aPTT/HL at 2100, then daily once stable F/u ability to transition back to PO  Rutherford Nail, PharmD PGY2 Critical Care Pharmacy Resident 02/27/2023 3:05 PM

## 2023-02-27 NOTE — Progress Notes (Signed)
[name], MRN:  [id number], DOB:  1927/07/22, LOS: 2 ADMISSION DATE:  02/25/2023, CONSULTATION DATE: 8/26 REFERRING MD: Dr. Randol Kern, CHIEF COMPLAINT: Hypotension  History of Present Illness:  Patient is encephalopathic and/or intubated; therefore, history has been obtained from chart review.  89 year old male with past medical history as below, which is significant for atrial fibrillation on Eliquis, wheelchair-bound, HFpEF, and stroke who presented to [facility] emergency department on 8/25 with complaints of acute onset abdominal pain with no bowel movement for 3 days prior to presentation.  Also sounds like he had not been consistently taking his Eliquis.  Upon arrival to the emergency department the patient was noted to be in atrial fibrillation with RVR to the 120s.  Started on diltiazem infusion and placed on heparin drip.  CT of the abdomen and pelvis demonstrated small bowel obstruction and moderate ascites.  General surgery was consulted and recommended conservative management with NG tube decompression.  He was admitted to the hospitalist service.  In the a.m. hours of 8/26 the patient developed waning mental status and became nonverbal.  He also developed hypotension with systolic pressures in the 70s and desaturation to the 80s.  His abdomen became more firm and rigid and exquisitely tender to pain.  The surgical team was at bedside and determined the patient would likely require urgent surgery.  PCCM was consulted for hypotension and hypoxia.  Pertinent  Medical History   has a past medical history of Anemia, Aortic insufficiency, Chest pain, H/O: GI bleed, Hyperlipidemia, Hypertension, Leg cramps, and Stroke (HCC).   Significant Hospital Events: Including procedures, antibiotic start and stop dates in addition to other pertinent events   8/25 admit for SBO 8/26 acute abdomen, tx to ICU for pressors. PICC line placed. Underwent Ex lap and small bowel  resection  Interim History / Subjective:    Objective   Blood pressure 101/72, pulse 62, temperature (!) 97.5 F (36.4 C), resp. rate 18, height 5\' 9"  (1.753 m), weight 99.7 kg, SpO2 99%.    Vent Mode: PRVC FiO2 (%):  [40 %-100 %] 40 % Set Rate:  [18 bmp] 18 bmp Vt Set:  [560 mL] 560 mL PEEP:  [5 cmH20] 5 cmH20   Intake/Output Summary (Last 24 hours) at 02/27/2023 0730 Last data filed at 02/27/2023 0600 Gross per 24 hour  Intake 4504.42 ml  Output 445 ml  Net 4059.42 ml   Filed Weights   02/26/23 0915 02/27/23 0443  Weight: 104.3 kg 99.7 kg    Examination: Blood pressure 101/72, pulse 62, temperature (!) 97.5 F (36.4 C), resp. rate 18, height 5\' 9"  (1.753 m), weight 99.7 kg, SpO2 99%. Gen:      No acute distress HEENT:  EOMI, sclera anicteric, ETT Neck:     No masses; no thyromegaly Lungs:    Clear to auscultation bilaterally; normal respiratory effort CV:         Regular rate and rhythm; no murmurs Abd:   Abdominal dressing, diminished bowel sounds. Ext:    No edema; adequate peripheral perfusion Skin:      Warm and dry; no rash Neuro: Sedated, arousable  Labs and imaging reviewed. Significant for BUN/Cr 23/1.37 WBC 1.9, Hb 9.0 No new imaging  Resolved Hospital Problem list     Assessment & Plan:  Small bowel obstruction s/p ex lap and small bowel resection.  Acute abdomen: Continue nasogastric tube to low intermittent suction Wound care Surgery is managing.   Septic shock Off  pressors  Repeat lactic acid Zosyn to cover for intraabdominal infection  Acute respiratory failure secondary to aspiration Start PSV trials  Follow intermittent chest x-ray  Atrial fibrillation with rapid ventricular response Diltiazem infusion stopped in the setting of shock Continue amiodarone drip  Heparin in lieu of home Eliquis  Chronic HFpEF Telemetry monitoring Echo reviewed with 40-45%. Moderate LVH,   Diabetes mellitus CBG monitoring and sliding scale  insulin  Baseline debility: Wheelchair-bound Full code per family  History of stroke Can resume home therapies if he survives acute period of his illness  Best Practice (right click and "Reselect all SmartList Selections" daily)   Diet/type: NPO DVT prophylaxis: systemic heparin GI prophylaxis: PPI Lines: Central line and Arterial Line Foley:  Yes, and it is still needed Code Status:  full code Last date of multidisciplinary goals of care discussion [02/26/23]  Critical care time:    The patient is critically ill with multiple organ system failure and requires high complexity decision making for assessment and support, frequent evaluation and titration of therapies, advanced monitoring, review of radiographic studies and interpretation of complex data.   Critical Care Time devoted to patient care services, exclusive of separately billable procedures, described in this note is 35 minutes.   Chilton Greathouse MD [facility] See Amion for pager  If no response to pager , please call [phone] until 7pm After 7:00 pm call Elink  [phone] 02/27/2023, 8:27 AM

## 2023-02-27 NOTE — Progress Notes (Addendum)
PHARMACY - TOTAL PARENTERAL NUTRITION CONSULT NOTE   Indication: Small bowel obstruction and Short bowel syndrome  Patient Measurements: Height: 5\' 9"  (175.3 cm) Weight: 99.7 kg (219 lb 12.8 oz) IBW/kg (Calculated) : 70.7   Body mass index is 32.46 kg/m. Usual Weight: 104 kg at admission  Assessment: 89yoM admitted 8/25 for abdominal pain after eating breakfast, found with small bowel obstruction. Exlap performed 8/26, patient in continuity however large amount of bowel resected with ~120 cm remaining. Pharmacy consulted to initiate TPN.  Glucose / Insulin: BG 114-147, A1c 6.5; has not needed sliding scale Electrolytes: Na 139, K 4.1, Cl 107, CO2 20, iCa 1.19, Mg 1.7, P 4.0 Renal: Scr 1.37 (b/l ~0.9), BUN 23, 0.1 ml/kg/h UOP Hepatic: 8/26 AST/ALT wnl, Tbili 0.6, Alkphos 31, Albumin 2.7 Intake / Output; MIVF: Net +4L GI Imaging: No imaging since initiation of TPN GI Surgeries / Procedures:  No procedures since initiation of TPN  Central access: PICC triple lumen TPN start date: 8/27  Nutritional Goals: Goal TPN rate is 75 mL/hr (provides 104.4 g of protein and 1832 kcals per day)  RD Assessment: Estimated Needs Total Energy Estimated Needs: 1800-2000 kcal/d Total Protein Estimated Needs: 90-105g/d Total Fluid Estimated Needs: 1.8L/d  Current Nutrition:  NPO  Plan:  Start TPN at approximately half of goal rate, 40 mL/hr at 1800 (provides 55.7 g protein, 978 kcal) Electrolytes in TPN: Na 76mEq/L, K 56mEq/L, Ca 28mEq/L, Mg 105mEq/L, and Phos 6mmol/L. Cl:Ac 1:1 Add standard MVI and trace elements to TPN Continue very Sensitive q4h SSI and adjust as needed  Discontinue MIVF Monitor TPN labs on Mon/Thurs, PRN  Supplement magnesium 2g IV once outside of TPN Supplement thiamine 100 mg IV x 5 days outside of TPN  Rutherford Nail, PharmD PGY2 Critical Care Pharmacy Resident 02/27/2023,10:14 AM

## 2023-02-27 NOTE — Progress Notes (Signed)
Pt placed on PS/CPAP 10/5 on 40% and is tolerating well. RT will monitor. 

## 2023-02-27 NOTE — Progress Notes (Signed)
OT Cancellation Note  Patient Details Name: Stephen Flynn MRN: [id number] DOB: 10-10-1927   Cancelled Treatment:    Reason Eval/Treat Not Completed: Patient not medically ready (intubated post-op; OT evaluation to follow up post extubation)  [name] 02/27/2023, 2:34 PM

## 2023-02-27 NOTE — Progress Notes (Signed)
ANTICOAGULATION CONSULT NOTE  Pharmacy Consult for heparin Indication: atrial fibrillation  Allergies  Allergen Reactions   Penicillin G     Other reaction(s): Drowsy   Penicillins Hives    Did it involve swelling of the face/tongue/throat, SOB, or low BP? No Did it involve sudden or severe rash/hives, skin peeling, or any reaction on the inside of your mouth or nose? yes Did you need to seek medical attention at a hospital or doctor's office? yes When did it last happen?  child     If all above answers are "NO", may proceed with cephalosporin use.    Patient Measurements: Height: 5\' 9"  (175.3 cm) Weight: 99.7 kg (219 lb 12.8 oz) IBW/kg (Calculated) : 70.7 Heparin Dosing Weight: 89kg  Vital Signs: Temp: 99 F (37.2 C) (08/27 1700) Temp Source: Oral (08/27 1700) BP: 130/88 (08/27 2100) Pulse Rate: 90 (08/27 2100)  Labs: Recent Labs    02/25/23 1433 02/25/23 1459 02/26/23 0431 02/26/23 0818 02/26/23 0907 02/26/23 1440 02/26/23 1554 02/26/23 1638 02/27/23 0311 02/27/23 1257 02/27/23 2057  HGB 10.0*   < >  --  13.2  --    < > 9.7* 10.2* 9.0*  --   --   HCT 31.0*   < >  --  41.2  --    < > 30.2* 30.0* 27.1*  --   --   PLT 304  --   --  356  --   --  255  --  229  --   --   APTT  --   --  49*  --  72*  --   --   --   --   --   --   HEPARINUNFRC  --    < > 0.20*  --  0.35  --   --   --   --  0.40 0.40  CREATININE 0.88   < > 1.17  --   --   --  1.29*  --  1.37*  --   --   TROPONINIHS 13  --   --   --   --   --   --   --   --   --   --    < > = values in this interval not displayed.    Estimated Creatinine Clearance: 39.2 mL/min (A) (by C-G formula based on SCr of 1.37 mg/dL (H)).   Medical History: Past Medical History:  Diagnosis Date   Anemia    Aortic insufficiency    Chest pain    H/O: GI bleed    Hyperlipidemia    Hypertension    Leg cramps    Stroke [facility])     Assessment: 89 YOM presenting with epigastric pain, on xarelto PTA for afib with last dose  taken last week sometime per pt report (previously switched from Eliquis to Xarelto for GI issues, however pt reports having more issues and stopping Xarelto a few days prior to admission which he attributed to the drug)  Heparin level remains therapeutic on 1250 units/hr  Goal of Therapy:  Heparin level 0.3-0.7 units/ml aPTT 66-102 seconds Monitor platelets by anticoagulation protocol: Yes   Plan:  Continue heparin gtt at 1250 units/hr Daily heparin level, CBC, s/s bleeding  Daylene Posey, PharmD, [facility] Clinical Pharmacist ED Pharmacist Phone # [phone] 02/27/2023 9:29 PM

## 2023-02-27 NOTE — Progress Notes (Signed)
Patient ID: Stephen Flynn, male   DOB: December 24, 1927, 89 y.o.   MRN: [id number] [facility] Surgery Progress Note  1 Day Post-Op  Subjective: CC-  No family at bedside. Weaning to hopefully extubate this morning. Off pressors. NG with about 100cc out this morning. No bowel function.  Objective: Vital signs in last 24 hours: Temp:  [93.9 F (34.4 C)-97.5 F (36.4 C)] 97.2 F (36.2 C) (08/27 0821) Pulse Rate:  [49-92] 71 (08/27 0821) Resp:  [15-22] 20 (08/27 0821) BP: (78-152)/(67-103) 108/70 (08/27 0800) SpO2:  [82 %-100 %] 93 % (08/27 0821) Arterial Line BP: (112-156)/(66-92) 126/71 (08/27 0821) FiO2 (%):  [40 %-100 %] 40 % (08/27 0736) Weight:  [99.7 kg-104.3 kg] 99.7 kg (08/27 0443)    Intake/Output from previous day: 08/26 0701 - 08/27 0700 In: 4504.4 [I.V.:3404.5; NG/GT:200; IV Piggyback:899.9] Out: 445 [Urine:345; Blood:100] Intake/Output this shift: No intake/output data recorded.  PE: Gen:  Alert on the vent, NAD Card:  RRR Pulm:  mechanically ventilated Abd: Soft, mild distension, open midline with healthy granulation tissue and no erythema or drainage  Lab Results:  Recent Labs    02/26/23 1554 02/26/23 1638 02/27/23 0311  WBC 1.7*  --  1.9*  HGB 9.7* 10.2* 9.0*  HCT 30.2* 30.0* 27.1*  PLT 255  --  229   BMET Recent Labs    02/26/23 1554 02/26/23 1638 02/27/23 0311  NA 138 141 139  K 4.1 4.1 4.1  CL 107  --  107  CO2 20*  --  20*  GLUCOSE 132*  --  147*  BUN 18  --  23  CREATININE 1.29*  --  1.37*  CALCIUM 8.3*  --  8.6*   PT/INR No results for input(s): "LABPROT", "INR" in the last 72 hours. CMP     Component Value Date/Time   NA 139 02/27/2023 0311   K 4.1 02/27/2023 0311   CL 107 02/27/2023 0311   CO2 20 (L) 02/27/2023 0311   GLUCOSE 147 (H) 02/27/2023 0311   BUN 23 02/27/2023 0311   CREATININE 1.37 (H) 02/27/2023 0311   CALCIUM 8.6 (L) 02/27/2023 0311   PROT 5.2 (L) 02/26/2023 1554   ALBUMIN 2.7 (L) 02/26/2023 1554   AST 23  02/26/2023 1554   ALT 12 02/26/2023 1554   ALKPHOS 31 (L) 02/26/2023 1554   BILITOT 0.6 02/26/2023 1554   GFRNONAA 48 (L) 02/27/2023 0311   GFRAA >60 05/16/2019 1110   Lipase     Component Value Date/Time   LIPASE 16 02/25/2023 1433       Studies/Results: DG CHEST PORT 1 VIEW  Result Date: 02/26/2023 CLINICAL DATA:  Status post central line placement EXAM: PORTABLE CHEST 1 VIEW COMPARISON:  Film from earlier in the same day. FINDINGS: Cardiac shadow is enlarged. Endotracheal tube and gastric catheter are noted in satisfactory position. Contrast is seen within the stomach. Right PICC is noted with the tip in the distal superior vena cava. Lungs are mildly hypoinflated related to the inspiratory effort. No focal confluent infiltrate is seen. IMPRESSION: New right PICC in the distal superior vena cava as described. Electronically Signed   By: Alcide Clever M.D.   On: 02/26/2023 20:06   DG Abd 1 View  Result Date: 02/26/2023 CLINICAL DATA:  NG tube placement EXAM: ABDOMEN - 1 VIEW COMPARISON:  None Available. FINDINGS: Possible small left pleural effusion. There is hazy opacity at the left lung base could represent atelectasis or infection. Cardiomegaly. Enteric tube courses below diaphragm with the  side hole positioned at the level of the GE junction in the tip obscured by enteric contrast material retained in the stomach. IMPRESSION: Enteric tube courses below diaphragm with the side hole positioned at the level of the GE junction and the tip obscured by enteric contrast material retained in the stomach. Recommend advancement. Electronically Signed   By: Lorenza Cambridge M.D.   On: 02/26/2023 17:07   Portable Chest x-ray  Result Date: 02/26/2023 CLINICAL DATA:  Ventilator dependence. Status post endotracheal tube placement. EXAM: PORTABLE CHEST 1 VIEW COMPARISON:  Earlier same day FINDINGS: Endotracheal tube tip is 4 cm above the base of the carina. The NG tube passes into the stomach although  the distal tip position is not included on the film. The cardio pericardial silhouette is enlarged. Interstitial markings are diffusely coarsened with chronic features. Retrocardiac atelectasis or infiltrate again noted. Telemetry leads overlie the chest. IMPRESSION: 1. Endotracheal tube tip is 4 cm above the base of the carina. 2. Chronic interstitial coarsening with similar retrocardiac atelectasis or infiltrate. Electronically Signed   By: Kennith Center M.D.   On: 02/26/2023 16:47   PERIPHERAL VASCULAR CATHETERIZATION  Result Date: 02/26/2023 See surgical note for result.  ECHOCARDIOGRAM COMPLETE  Result Date: 02/26/2023    ECHOCARDIOGRAM REPORT   Patient Name:   Stephen Flynn Date of Exam: 02/26/2023 Medical Rec #:  [id number]     Height:       69.0 in Accession #:    [id number]    Weight:       230.0 lb Date of Birth:  1928-04-28      BSA:          2.192 m Patient Age:    89 years      BP:           99/80 mmHg Patient Gender: M             HR:           84 bpm. Exam Location:  Inpatient Procedure: 2D Echo, Color Doppler and Cardiac Doppler Indications:    Afib  History:        Patient has prior history of Echocardiogram examinations, most                 recent 03/22/2019. CHF; Risk Factors:Diabetes, Dyslipidemia and                 Hypertension.  Sonographer:    Milbert Coulter Referring Phys: [id number] Stephen Flynn  Sonographer Comments: Image acquisition challenging due to respiratory motion and pleural effusion and hard abdomen presumably from SBO. IMPRESSIONS  1. Left ventricular ejection fraction, by estimation, is 40 to 45%. The left ventricle has mildly decreased function. The left ventricle demonstrates global hypokinesis. There is moderate concentric left ventricular hypertrophy. Left ventricular diastolic function could not be evaluated.  2. Right ventricular systolic function is normal. The right ventricular size is normal. There is normal pulmonary artery systolic pressure. The estimated right  ventricular systolic pressure is 31.7 mmHg.  3. Left atrial size was mildly dilated.  4. The mitral valve is degenerative. Trivial mitral valve regurgitation. No evidence of mitral stenosis.  5. The aortic valve is tricuspid. Aortic valve regurgitation is mild. Aortic valve sclerosis/calcification is present, without any evidence of aortic stenosis. Aortic valve area, by VTI measures 2.28 cm. Aortic valve mean gradient measures 5.0 mmHg. Aortic valve Vmax measures 1.41 m/s.  6. The inferior vena cava is normal in size with greater than  50% respiratory variability, suggesting right atrial pressure of 3 mmHg.  7. Ascending aorta measurements are within normal limits for age when indexed to body surface area. FINDINGS  Left Ventricle: Left ventricular ejection fraction, by estimation, is 40 to 45%. The left ventricle has mildly decreased function. The left ventricle demonstrates global hypokinesis. The left ventricular internal cavity size was normal in size. There is  moderate concentric left ventricular hypertrophy. Abnormal (paradoxical) septal motion, consistent with left bundle branch block. Left ventricular diastolic function could not be evaluated due to atrial fibrillation. Left ventricular diastolic function could not be evaluated. Right Ventricle: The right ventricular size is normal. No increase in right ventricular wall thickness. Right ventricular systolic function is normal. There is normal pulmonary artery systolic pressure. The tricuspid regurgitant velocity is 2.68 m/s, and  with an assumed right atrial pressure of 3 mmHg, the estimated right ventricular systolic pressure is 31.7 mmHg. Left Atrium: Left atrial size was mildly dilated. Right Atrium: Right atrial size was normal in size. Pericardium: Trivial pericardial effusion is present. The pericardial effusion is posterior to the left ventricle. Mitral Valve: The mitral valve is degenerative in appearance. There is mild thickening of the mitral  valve leaflet(s). There is mild calcification of the mitral valve leaflet(s). Mild mitral annular calcification. Trivial mitral valve regurgitation. No evidence of mitral valve stenosis. Tricuspid Valve: The tricuspid valve is normal in structure. Tricuspid valve regurgitation is mild . No evidence of tricuspid stenosis. Aortic Valve: The aortic valve is tricuspid. Aortic valve regurgitation is mild. Aortic valve sclerosis/calcification is present, without any evidence of aortic stenosis. Aortic valve mean gradient measures 5.0 mmHg. Aortic valve peak gradient measures 8.0 mmHg. Aortic valve area, by VTI measures 2.28 cm. Pulmonic Valve: The pulmonic valve was normal in structure. Pulmonic valve regurgitation is not visualized. No evidence of pulmonic stenosis. Aorta: The aortic root is normal in size and structure. Ascending aorta measurements are within normal limits for age when indexed to body surface area. Venous: The inferior vena cava is normal in size with greater than 50% respiratory variability, suggesting right atrial pressure of 3 mmHg. IAS/Shunts: No atrial level shunt detected by color flow Doppler.  LEFT VENTRICLE PLAX 2D LVIDd:         4.10 cm LVIDs:         3.50 cm LV PW:         1.50 cm LV IVS:        1.30 cm LVOT diam:     2.20 cm LV SV:         48 LV SV Index:   22 LVOT Area:     3.80 cm  RIGHT VENTRICLE RV Basal diam:  3.20 cm RV S prime:     21.40 cm/s TAPSE (M-mode): 1.9 cm LEFT ATRIUM             Index        RIGHT ATRIUM           Index LA diam:        3.20 cm 1.46 cm/m   RA Area:     20.60 cm LA Vol (A2C):   79.7 ml 36.35 ml/m  RA Volume:   49.40 ml  22.53 ml/m LA Vol (A4C):   88.2 ml 40.23 ml/m LA Biplane Vol: 85.3 ml 38.91 ml/m  AORTIC VALVE AV Area (Vmax):    2.55 cm AV Area (Vmean):   2.29 cm AV Area (VTI):     2.28 cm AV Vmax:  141.00 cm/s AV Vmean:          104.000 cm/s AV VTI:            0.210 m AV Peak Grad:      8.0 mmHg AV Mean Grad:      5.0 mmHg LVOT Vmax:          94.70 cm/s LVOT Vmean:        62.650 cm/s LVOT VTI:          0.126 m LVOT/AV VTI ratio: 0.60  AORTA Ao Root diam: 4.00 cm Ao Asc diam:  4.10 cm TRICUSPID VALVE TR Peak grad:   28.7 mmHg TR Vmax:        268.00 cm/s  SHUNTS Systemic VTI:  0.13 m Systemic Diam: 2.20 cm Armanda Magic MD Electronically signed by Armanda Magic MD Signature Date/Time: 02/26/2023/1:17:32 PM    Final    DG Chest Port 1 View  Result Date: 02/26/2023 CLINICAL DATA:  Hypoxia.Acute onset of chest pain for 1 hour. Shortness of breath EXAM: PORTABLE CHEST 1 VIEW COMPARISON:  12/15/2022. FINDINGS: Interval placement of ET tube. The tip enters the left mainstem bronchus. Recommend withdrawing by approximately 3.5 cm. Stable cardiomediastinal contours. Decreased lung volumes compared with previous exam. Retrocardiac opacification in the left lower lung is new and may reflect left lower lobe atelectasis. Enteric contrast material is again noted within the stomach. IMPRESSION: 1. Interval placement of ET tube. The tip enters the left mainstem bronchus. Recommend withdrawing by approximately 3.5 cm. 2. New retrocardiac opacification in the left lower lung may reflect left lower lobe atelectasis. These results will be called to the ordering clinician or representative by the Radiologist Assistant, and communication documented in the PACS or [employer]. Electronically Signed   By: Signa Kell M.D.   On: 02/26/2023 11:33   [place] EKG SITE RITE  Result Date: 02/26/2023 If Site Rite image not attached, placement could not be confirmed due to current cardiac rhythm.  DG Abd Portable 1V-Small Bowel Obstruction Protocol-initial, 8 hr delay  Result Date: 02/26/2023 CLINICAL DATA:  8 hour delayed imaging for small bowel obstruction. EXAM: PORTABLE ABDOMEN - 1 VIEW COMPARISON:  02/24/2023 FINDINGS: Enteric contrast material is identified within the gastric fundus. Dilute contrast material opacifies the dilated proximal small bowel loops within  the left hemiabdomen. No enteric contrast material identified within the colon. IV contrast material from previous CT scan is now seen within the urinary bladder. IMPRESSION: There is a dilute enteric contrast material identified within the previously noted dilated proximal small bowel loops. No enteric contrast material within the colon. Continued serial imaging of the abdomen advised with next radiograph in 8 hours. Electronically Signed   By: Signa Kell M.D.   On: 02/26/2023 05:44   DG Abd Portable 1 View  Result Date: 02/25/2023 CLINICAL DATA:  NG placement.  Small-bowel obstruction. EXAM: PORTABLE ABDOMEN - 1 VIEW COMPARISON:  CT abdomen pelvis dated 02/25/2023. FINDINGS: Partially visualized enteric tube with tip in the left upper abdomen, likely in the region of the gastric fundus. The side port of the enteric tube is in the proximal stomach. IMPRESSION: Enteric tube with tip in the region of the gastric fundus. Electronically Signed   By: Elgie Collard M.D.   On: 02/25/2023 19:42   CT ABDOMEN PELVIS W CONTRAST  Result Date: 02/25/2023 CLINICAL DATA:  Acute abdominal pain. EXAM: CT ABDOMEN AND PELVIS WITH CONTRAST TECHNIQUE: Multidetector CT imaging of the abdomen and pelvis was performed using the  standard protocol following bolus administration of intravenous contrast. RADIATION DOSE REDUCTION: This exam was performed according to the departmental dose-optimization program which includes automated exposure control, adjustment of the mA and/or kV according to patient size and/or use of iterative reconstruction technique. CONTRAST:  75mL OMNIPAQUE IOHEXOL 350 MG/ML SOLN COMPARISON:  CT 06/02/2019 FINDINGS: Lower chest: The heart is enlarged.  Patulous distal esophagus. Hepatobiliary: Scattered subcentimeter hypodense liver lesions are stable from 2020 exam. No evidence of new liver lesion. Gallbladder physiologically distended, no calcified stone. No biliary dilatation. Pancreas: Pancreatic  atrophy.  No ductal dilatation or inflammation. Spleen: Normal in size without focal abnormality. Adrenals/Urinary Tract: No adrenal nodule. No hydronephrosis. Simple cyst arises from the upper pole of the right kidney. Smaller simple cyst in the upper left kidney. No further follow-up imaging is recommended. No renal calculi or suspicious renal lesion. Bladder is physiologically distended. Equivocal bladder wall thickening. Stomach/Bowel: Dilated fluid-filled stomach. Dilated fluid-filled small bowel in the left mid abdomen. Transition point in the central abdomen series 3, image 45. The more distal small bowel is decompressed. There is prominent mesenteric edema and free fluid. No bowel pneumatosis. Small to moderate volume stool in the colon. The sigmoid colon is redundant. The appendix is not definitively seen. Vascular/Lymphatic: Aortic atherosclerosis. No aneurysm. Portal vein is patent. No portal venous or mesenteric gas. Calcified nodule in the left small bowel mesentery may represent a calcified lymph node. Reproductive: Enlarged prostate gland causing mass effect on the bladder base. Other: Moderate abdominopelvic ascites. Mesenteric edema primarily in the left abdomen. Small amount of ascites in the left inguinal canal. No free air. Left flank subcutaneous lipoma. Musculoskeletal: Advanced right hip arthropathy. Diffuse lumbar degenerative change. There are no acute or suspicious osseous abnormalities. IMPRESSION: 1. Small-bowel obstruction with transition point in the central abdomen. There is prominent mesenteric edema and free fluid. The degree of mesenteric inflammation can predispose to ischemia. Recommend correlation with lactate. 2. Moderate abdominopelvic ascites. 3. Enlarged prostate gland causing mass effect on the bladder base. Equivocal bladder wall thickening. Aortic Atherosclerosis (ICD10-I70.0). Electronically Signed   By: Narda Rutherford M.D.   On: 02/25/2023 16:56   DG Chest Port 1  View  Result Date: 02/25/2023 CLINICAL DATA:  Acute onset chest pain 1 hour ago. Shortness of breath. EXAM: PORTABLE CHEST 1 VIEW COMPARISON:  12/15/2022 FINDINGS: Stable cardiomegaly and ectasia of thoracic aorta. Low lung volumes noted, however both lungs are clear. IMPRESSION: Low lung volumes. No active lung disease. Electronically Signed   By: Danae Orleans M.D.   On: 02/25/2023 15:31    Anti-infectives: Anti-infectives (From admission, onward)    Start     Dose/Rate Route Frequency Ordered Stop   02/26/23 1445  piperacillin-tazobactam (ZOSYN) IVPB 3.375 g  Status:  Discontinued        3.375 g 100 mL/hr over 30 Minutes Intravenous Every 8 hours 02/26/23 1436 02/26/23 1445   02/26/23 1445  ceFEPIme (MAXIPIME) 2 g in sodium chloride 0.9 % 100 mL IVPB        2 g 200 mL/hr over 30 Minutes Intravenous Every 12 hours 02/26/23 1446     02/26/23 1445  metroNIDAZOLE (FLAGYL) IVPB 500 mg        500 mg 100 mL/hr over 60 Minutes Intravenous Every 12 hours 02/26/23 1446     02/26/23 1015  meropenem (MERREM) 1 g in sodium chloride 0.9 % 100 mL IVPB        1 g 200 mL/hr over 30 Minutes Intravenous  Once 02/26/23 0981 02/26/23 1743        Assessment/Plan Small bowel obstruction involving a medial adhesion across the distal jejunum  -POD#1 s/p Exploratory laparotomy small bowel resection and primary anastomosis 8/26 Dr. Luisa Hart - approximately 120 cm of small bowel remaining  - continue NPO/NGT to LIWS and await return in bowel function. Start TPN - daily wet to dry dressing changes to midline abdominal wound  FEN - IVF, NPO/NGT to LIWS, start TPN VTE - heparin gtt ID - maxipime/flagyl 8/26>> continue at least 5 days postop Foley - continue for strict I&O   AKI ABL anemia A fib RVR - improved. On amiodarone drip, heparin gtt Wheelchair/walker bound HTN H/O CVA HLD Diastolic CHF DM    LOS: 2 days    Franne Forts, [facility] 02/27/2023, 8:54 AM Please see  Amion for pager number during day hours 7:00am-4:30pm

## 2023-02-28 ENCOUNTER — Inpatient Hospital Stay (HOSPITAL_COMMUNITY): Payer: 59

## 2023-02-28 DIAGNOSIS — E440 Moderate protein-calorie malnutrition: Secondary | ICD-10-CM | POA: Insufficient documentation

## 2023-02-28 DIAGNOSIS — K56609 Unspecified intestinal obstruction, unspecified as to partial versus complete obstruction: Secondary | ICD-10-CM | POA: Diagnosis not present

## 2023-02-28 LAB — PHOSPHORUS
Phosphorus: 3.7 mg/dL (ref 2.5–4.6)
Phosphorus: 3.3 mg/dL (ref 2.5–4.6)

## 2023-02-28 LAB — GLUCOSE, CAPILLARY
Glucose-Capillary: 122 mg/dL — ABNORMAL HIGH (ref 70–99)
Glucose-Capillary: 169 mg/dL — ABNORMAL HIGH (ref 70–99)
Glucose-Capillary: 158 mg/dL — ABNORMAL HIGH (ref 70–99)
Glucose-Capillary: 114 mg/dL — ABNORMAL HIGH (ref 70–99)
Glucose-Capillary: 113 mg/dL — ABNORMAL HIGH (ref 70–99)
Glucose-Capillary: 156 mg/dL — ABNORMAL HIGH (ref 70–99)
Glucose-Capillary: 158 mg/dL — ABNORMAL HIGH (ref 70–99)

## 2023-02-28 LAB — CBC
HCT: 25.3 % — ABNORMAL LOW (ref 39.0–52.0)
Hemoglobin: 8.5 g/dL — ABNORMAL LOW (ref 13.0–17.0)
MCH: 31.1 pg (ref 26.0–34.0)
MCHC: 33.6 g/dL (ref 30.0–36.0)
MCV: 92.7 fL (ref 80.0–100.0)
Platelets: 260 10*3/uL (ref 150–400)
RBC: 2.73 MIL/uL — ABNORMAL LOW (ref 4.22–5.81)
RDW: 15 % (ref 11.5–15.5)
WBC: 6.9 10*3/uL (ref 4.0–10.5)
nRBC: 0 % (ref 0.0–0.2)

## 2023-02-28 LAB — HEPARIN LEVEL (UNFRACTIONATED): Heparin Unfractionated: 0.31 [IU]/mL (ref 0.30–0.70)

## 2023-02-28 LAB — BASIC METABOLIC PANEL
Anion gap: 8 (ref 5–15)
BUN: 34 mg/dL — ABNORMAL HIGH (ref 8–23)
CO2: 23 mmol/L (ref 22–32)
Calcium: 8.5 mg/dL — ABNORMAL LOW (ref 8.9–10.3)
Chloride: 107 mmol/L (ref 98–111)
Creatinine, Ser: 1.97 mg/dL — ABNORMAL HIGH (ref 0.61–1.24)
GFR, Estimated: 31 mL/min — ABNORMAL LOW (ref >60)
Glucose, Bld: 188 mg/dL — ABNORMAL HIGH (ref 70–99)
Potassium: 4 mmol/L (ref 3.5–5.1)
Sodium: 138 mmol/L (ref 135–145)

## 2023-02-28 LAB — MAGNESIUM
Magnesium: 2.2 mg/dL (ref 1.7–2.4)
Magnesium: 2.2 mg/dL (ref 1.7–2.4)

## 2023-02-28 LAB — SURGICAL PATHOLOGY

## 2023-02-28 MED ORDER — TRAVASOL 10 % IV SOLN
INTRAVENOUS | Status: AC
  Filled 2023-02-28: qty 1044

## 2023-02-28 MED ORDER — INSULIN ASPART 100 UNIT/ML IJ SOLN
0.0000 [IU] | INTRAMUSCULAR | Status: DC
  Administered 2023-02-28 (×2): 3 [IU] via SUBCUTANEOUS
  Administered 2023-03-01: 2 [IU] via SUBCUTANEOUS
  Administered 2023-03-01: 3 [IU] via SUBCUTANEOUS
  Administered 2023-03-01: 2 [IU] via SUBCUTANEOUS
  Administered 2023-03-01: 3 [IU] via SUBCUTANEOUS
  Administered 2023-03-01 – 2023-03-02 (×3): 2 [IU] via SUBCUTANEOUS
  Administered 2023-03-02: 3 [IU] via SUBCUTANEOUS
  Administered 2023-03-02 – 2023-03-04 (×15): 2 [IU] via SUBCUTANEOUS
  Administered 2023-03-04: 3 [IU] via SUBCUTANEOUS
  Administered 2023-03-05: 2 [IU] via SUBCUTANEOUS
  Administered 2023-03-05: 3 [IU] via SUBCUTANEOUS
  Administered 2023-03-05: 2 [IU] via SUBCUTANEOUS
  Administered 2023-03-05 (×2): 3 [IU] via SUBCUTANEOUS
  Administered 2023-03-05 – 2023-03-09 (×16): 2 [IU] via SUBCUTANEOUS
  Administered 2023-03-12: 3 [IU] via SUBCUTANEOUS

## 2023-02-28 MED ORDER — DEXTROSE 10 % IV SOLN
INTRAVENOUS | Status: AC
  Administered 2023-02-28: 40 mL/h via INTRAVENOUS

## 2023-02-28 MED ORDER — SODIUM CHLORIDE 0.9 % IV SOLN
2.0000 g | INTRAVENOUS | Status: DC
  Administered 2023-03-01: 2 g via INTRAVENOUS
  Filled 2023-02-28: qty 12.5

## 2023-02-28 NOTE — TOC Initial Note (Signed)
Transition of Care [facility]) - Initial/Assessment Note    Patient Details  Name: Stephen Flynn MRN: [id number] Date of Birth: 1927/11/12  [facility] CM/SW Contact:    Elliot Cousin, RN Phone Number: [phone] 02/28/2023, 8:59 AM  Clinical Narrative:     CM spoke to pt and son at bedside. Wife reports they live in a [facility] [address]. States pt was scheduled for outpt PT prior to hospital. Explained PT/OT will evaluate and make recommendations. Will continue to follow for dc needs.               Expected Discharge Plan: Skilled Nursing Facility Barriers to Discharge: Continued Medical Work up   Patient Goals and CMS Choice Patient states their goals for this hospitalization and ongoing recovery are:: wants husband to recover          Expected Discharge Plan and Services   Discharge Planning Services: CM Consult   Living arrangements for the past 2 months: Apartment                                      Prior Living Arrangements/Services Living arrangements for the past 2 months: Apartment Lives with:: Spouse Patient language and need for interpreter reviewed:: Yes        Need for Family Participation in Patient Care: Yes (Comment) Care giver support system in place?: Yes (comment) Current home services: DME (rolling walker, wheelchair) Criminal Activity/Legal Involvement Pertinent to Current Situation/Hospitalization: No - Comment as needed  Activities of Daily Living      Permission Sought/Granted Permission sought to share information with : Case Manager, Family Supports Permission granted to share information with : Yes, Verbal Permission Granted  Share Information with NAME: Sachit Franchina     Permission granted to share info w Relationship: wife  Permission granted to share info w Contact Information: [phone]  Emotional Assessment Appearance:: Appears stated age Attitude/Demeanor/Rapport: Lethargic   Orientation:  : Oriented to Self      Admission diagnosis:  Small bowel obstruction (HCC) [K56.609] SBO (small bowel obstruction) (HCC) [K56.609] Upper abdominal pain [R10.10] Patient Active Problem List   Diagnosis Date Noted   Septic shock (HCC) 02/26/2023   Aspiration into airway 02/26/2023   SBO (small bowel obstruction) (HCC) 02/25/2023   Lower extremity edema 08/29/2022   Persistent atrial fibrillation with RVR (HCC) 08/29/2022   Unilateral primary osteoarthritis, right hip 08/29/2022   Edema 10/18/2021   Adult failure to thrive syndrome 07/15/2021   Advanced age 53/13/2023   Diastolic dysfunction 07/15/2021   Encounter for general adult medical examination without abnormal findings 07/15/2021   Chronic diastolic heart failure (HCC) 09/15/2020   H/O: stroke 09/15/2020   Other dyspnea and respiratory abnormality 09/15/2020   Venous insufficiency of leg 09/15/2020   Pain of right thigh 09/15/2020   Paronychia of great toe, right 12/09/2019   Pain due to onychomycosis of toenails of both feet 05/28/2019   Neuropathy 05/28/2019   Abnormal gait 03/31/2019   Aphasia 03/31/2019   Late effects of cerebrovascular disease 03/31/2019   Thoracic aortic aneurysm without rupture (HCC) 03/31/2019   Hardening of the aorta (main artery of the heart) (HCC) 03/31/2019   Ischemic stroke (HCC) 03/21/2019   Essential hypertension 03/21/2019   BPH (benign prostatic hyperplasia) 03/21/2019   Cataract 08/05/2018   Disequilibrium 10/24/2017   Constipation 07/27/2016   Heart failure (HCC) 03/31/2016  Malignant tumor of prostate (HCC) 01/25/2016   Hypertensive pulmonary arterial disease (HCC) 07/31/2013   Morbid obesity (HCC) 07/15/2013   Type 2 diabetes mellitus without complications (HCC) 01/13/2013   Sebaceous cyst 07/11/2012   Underimmunization status 04/11/2012   Aortic aneurysm (HCC) 04/19/2009   Hyperlipidemia 04/19/2009   Iron deficiency anemia 04/19/2009   UNSPECIFIED ANEMIA 07/25/2007   G E  R D 07/25/2007   HIATAL HERNIA 07/25/2007   HEMORRHOIDS, INTERNAL 05/28/2007   UNS GASTRITIS&GASTRODUODITIS W/O MENTION HEMORR 05/28/2007   DUODENITIS WITHOUT MENTION OF HEMORRHAGE 05/28/2007   PCP:  Creola Corn, MD Pharmacy:   [facility] [phone] - [place], [place] - [address] AT [facility] [address] [place] [address] Phone: [phone] Fax: [phone]     Social Determinants of Health (SDOH) Social History: SDOH Screenings   Transportation Needs: No Transportation Needs (04/02/2019)  Depression (PHQ2-9): Low Risk  (04/02/2019)  Tobacco Use: Medium Risk (02/25/2023)   SDOH Interventions:     Readmission Risk Interventions     No data to display

## 2023-02-28 NOTE — Progress Notes (Signed)
Received consult from primary RN. Stated that TPN accidentally got dislodged. Recommended to change the cap, hang D10 at this time and to consult the primary provider and pharmacist. Encouraged RN to reach out with any other concerns.

## 2023-02-28 NOTE — Plan of Care (Signed)
  Problem: Education: Goal: Ability to describe self-care measures that may prevent or decrease complications (Diabetes Survival Skills Education) will improve Outcome: Progressing Goal: Individualized Educational Video(s) Outcome: Progressing   Problem: Coping: Goal: Ability to adjust to condition or change in health will improve Outcome: Progressing   Problem: Fluid Volume: Goal: Ability to maintain a balanced intake and output will improve Outcome: Progressing   Problem: Health Behavior/Discharge Planning: Goal: Ability to identify and utilize available resources and services will improve Outcome: Progressing Goal: Ability to manage health-related needs will improve Outcome: Progressing   Problem: Metabolic: Goal: Ability to maintain appropriate glucose levels will improve Outcome: Progressing   Problem: Nutritional: Goal: Maintenance of adequate nutrition will improve Outcome: Progressing Goal: Progress toward achieving an optimal weight will improve Outcome: Progressing   Problem: Skin Integrity: Goal: Risk for impaired skin integrity will decrease Outcome: Progressing   Problem: Tissue Perfusion: Goal: Adequacy of tissue perfusion will improve Outcome: Progressing   Problem: Education: Goal: Knowledge of General Education information will improve Description: Including pain rating scale, medication(s)/side effects and non-pharmacologic comfort measures Outcome: Progressing   Problem: Health Behavior/Discharge Planning: Goal: Ability to manage health-related needs will improve Outcome: Progressing   Problem: Clinical Measurements: Goal: Ability to maintain clinical measurements within normal limits will improve Outcome: Progressing Goal: Will remain free from infection Outcome: Progressing Goal: Diagnostic test results will improve Outcome: Progressing Goal: Respiratory complications will improve Outcome: Progressing Goal: Cardiovascular complication will  be avoided Outcome: Progressing   Problem: Activity: Goal: Risk for activity intolerance will decrease Outcome: Progressing   Problem: Nutrition: Goal: Adequate nutrition will be maintained Outcome: Progressing   Problem: Coping: Goal: Level of anxiety will decrease Outcome: Progressing   Problem: Elimination: Goal: Will not experience complications related to bowel motility Outcome: Progressing Goal: Will not experience complications related to urinary retention Outcome: Progressing   Problem: Pain Managment: Goal: General experience of comfort will improve Outcome: Progressing   Problem: Safety: Goal: Ability to remain free from injury will improve Outcome: Progressing   Problem: Skin Integrity: Goal: Risk for impaired skin integrity will decrease Outcome: Progressing   Problem: Activity: Goal: Ability to tolerate increased activity will improve Outcome: Progressing   Problem: Respiratory: Goal: Ability to maintain a clear airway and adequate ventilation will improve Outcome: Progressing   Problem: Role Relationship: Goal: Method of communication will improve Outcome: Progressing   Problem: Safety: Goal: Non-violent Restraint(s) Outcome: Progressing   

## 2023-02-28 NOTE — Progress Notes (Addendum)
PHARMACY - TOTAL PARENTERAL NUTRITION, ANTIBIOTICS, ANTICOAGULATION CONSULT NOTE   Indication: Small bowel obstruction and Short bowel syndrome  Patient Measurements: Height: 5\' 9"  (175.3 cm) Weight: 99.7 kg (219 lb 12.8 oz) IBW/kg (Calculated) : 70.7   Body mass index is 32.46 kg/m. Usual Weight: 104 kg at admission  Assessment: 89yoM admitted 8/25 for abdominal pain after eating breakfast, found with small bowel obstruction. Exlap performed 8/26, patient in continuity however large amount of bowel resected with ~120 cm remaining. Pharmacy consulted to initiate TPN.  Glucose / Insulin: BG 114-147, A1c 6.5; has not needed sliding scale Electrolytes: Na 138, K 4.0, Cl 107, CO2 23, iCa 1.19, CoCa 9.3, Mg 2.2, P 3.7 Renal: Scr 1.37 (b/l ~0.9), BUN 23, 0.1 ml/kg/h UOP Hepatic: 8/26 AST/ALT wnl, Tbili 0.6, Alkphos 31, Albumin 2.7 Intake / Output; MIVF: Net +4L GI Imaging: No imaging since initiation of TPN GI Surgeries / Procedures:  No procedures since initiation of TPN  Central access: PICC triple lumen TPN start date: 8/27  Nutritional Goals: Goal TPN rate is 75 mL/hr (provides 104.4 g of protein and 1832 kcals per day)  RD Assessment: Estimated Needs Total Energy Estimated Needs: 1800-2000 kcal/d Total Protein Estimated Needs: 90-105g/d Total Fluid Estimated Needs: 1.8L/d  Current Nutrition:  NPO and TPN  Plan:  Increase TPN to goal rate of 75 mL/hr at 1800 (provides 104.4 g protein, 1814.4 kcal), which will be 100% of goal needs Electrolytes in TPN: Na 64mEq/L, K 25 mEq/L, Ca 3 mEq/L, Mg 5 mEq/L, and Phos 7 mmol/L. Cl:Ac 1:1 Add standard MVI and trace elements to TPN Increase to Moderate q4h SSI and adjust as needed  Discontinue MIVF at 1800 with new TPN Monitor TPN labs on Mon/Thurs, PRN  Supplement thiamine 100 mg IV x 5 days outside of TPN Adjust to cefepime 2g q24h for worsening renal function (CrCl 27.3) Continue heparin 1250 u/h for therapeutic heparin level of  0.31  Per RN, line became disconnected from TPN while PT in room so will run D10W in its place until new TPN is initiated this evening.  Rutherford Nail, PharmD PGY2 Critical Care Pharmacy Resident 02/28/2023,10:54 AM

## 2023-02-28 NOTE — Evaluation (Signed)
Physical Therapy Evaluation Patient Details Name: Stephen Flynn MRN: [id number] DOB: 05-Jul-1927 Today's Date: 02/28/2023  History of Present Illness  89 y/o male admitted 8/25 with abdominal pain, SOB with bowel obstruction. 8/26 developed hypotension and required ex-lap with small bowel resection, remained intubated post op. Extubated 8/27. PMhx: GIB, stroke, HTN, Afib, neuropathy, CHF, obesity   Clinical Impression  Pt admitted with above diagnosis. PTA pt lived at home with his wife, mod I household mobility using RW vs cane vs w/c. Wife reports pt has been sleeping in lift chair. Pt currently with functional limitations due to the deficits listed below (see PT Problem List). On eval, pt required max assist rolling, and +2 max assist supine <> sit. He demo fair sitting balance EOB. VSS on 3L. Pt will benefit from acute skilled PT to increase their independence and safety with mobility to allow discharge.  Recommend continued PT in inpatient setting, > 3 hours/day upon d/c.          If plan is discharge home, recommend the following: Two people to help with walking and/or transfers;Two people to help with bathing/dressing/bathroom;Assist for transportation;Assistance with cooking/housework   Can travel by private vehicle        Equipment Recommendations Other (comment) (TBD)  Recommendations for Other Services  Rehab consult    Functional Status Assessment Patient has had a recent decline in their functional status and demonstrates the ability to make significant improvements in function in a reasonable and predictable amount of time.     Precautions / Restrictions Precautions Precautions: Fall;Other (comment) Precaution Comments: NG tube      Mobility  Bed Mobility Overal bed mobility: Needs Assistance Bed Mobility: Rolling, Sidelying to Sit, Sit to Supine Rolling: Max assist Sidelying to sit: +2 for physical assistance, Max assist, HOB elevated   Sit to supine: +2 for  physical assistance, Max assist   General bed mobility comments: Pt actively participating, contributing approx 25% of works to transition sup<>sit.    Transfers                   General transfer comment: unable, due to weakness    Ambulation/Gait                  Stairs            Wheelchair Mobility     Tilt Bed    Modified Rankin (Stroke Patients Only)       Balance Overall balance assessment: Needs assistance Sitting-balance support: Feet supported, Single extremity supported Sitting balance-Leahy Scale: Fair                                       Pertinent Vitals/Pain Pain Assessment Pain Assessment: Faces Faces Pain Scale: Hurts little more Pain Location: abdomen, RLE Pain Descriptors / Indicators: Pressure, Discomfort, Sore Pain Intervention(s): Monitored during session, Repositioned, Limited activity within patient's tolerance    Home Living Family/patient expects to be discharged to:: Private residence Living Arrangements: Spouse/significant other Available Help at Discharge: Family;Available PRN/intermittently Type of Home: Apartment Home Access: Elevator       Home Layout: One level Home Equipment: Warden/ranger (2 wheels);Cane - single point;Tub bench;BSC/3in1;Grab bars - tub/shower;Lift chair      Prior Function Prior Level of Function : Needs assist             Mobility Comments: pt uses cane, RW, and  wheelchair based on how he feels. Sounds like most recently has been using wheelchair frequently of recent. Propels wheelchair with LLE. Pt has been sleeping in lift chair. ADLs Comments: wife assists with donning socks, in/out shower, and cooking     Extremity/Trunk Assessment   Upper Extremity Assessment Upper Extremity Assessment: Defer to OT evaluation    Lower Extremity Assessment Lower Extremity Assessment: Generalized weakness;RLE deficits/detail (edema noted bilat) RLE  Deficits / Details: refers to it as his 'bad leg,' pain at hip and knee    Cervical / Trunk Assessment Cervical / Trunk Assessment: Kyphotic  Communication   Communication Communication: Difficulty following commands/understanding;Difficulty communicating thoughts/reduced clarity of speech (low voice, jumbled words. Difficult to understand at times.) Following commands: Follows one step commands with increased time;Follows one step commands inconsistently Cueing Techniques: Verbal cues;Tactile cues  Cognition Arousal: Alert Behavior During Therapy: Flat affect Overall Cognitive Status: Impaired/Different from baseline Area of Impairment: Attention, Following commands, Safety/judgement, Problem solving, Awareness                   Current Attention Level: Sustained   Following Commands: Follows one step commands with increased time, Follows one step commands inconsistently Safety/Judgement: Decreased awareness of deficits, Decreased awareness of safety Awareness: Emergent Problem Solving: Slow processing, Difficulty sequencing, Requires verbal cues, Requires tactile cues General Comments: Internally distracted by trying to cough up secretions.        General Comments General comments (skin integrity, edema, etc.): VSS on 3L    Exercises     Assessment/Plan    PT Assessment Patient needs continued PT services  PT Problem List Decreased strength;Decreased balance;Pain;Decreased mobility;Decreased activity tolerance       PT Treatment Interventions Functional mobility training;Balance training;Patient/family education;Gait training;Therapeutic activities;Therapeutic exercise;Cognitive remediation    PT Goals (Current goals can be found in the Care Plan section)  Acute Rehab PT Goals Patient Stated Goal: home PT Goal Formulation: With patient/family Time For Goal Achievement: 03/14/23 Potential to Achieve Goals: Good    Frequency Min 1X/week     Co-evaluation                AM-PAC PT "6 Clicks" Mobility  Outcome Measure Help needed turning from your back to your side while in a flat bed without using bedrails?: A Lot Help needed moving from lying on your back to sitting on the side of a flat bed without using bedrails?: Total Help needed moving to and from a bed to a chair (including a wheelchair)?: Total Help needed standing up from a chair using your arms (e.g., wheelchair or bedside chair)?: Total Help needed to walk in hospital room?: Total Help needed climbing 3-5 steps with a railing? : Total 6 Click Score: 7    End of Session Equipment Utilized During Treatment: Oxygen Activity Tolerance: Patient tolerated treatment well Patient left: in bed;with call bell/phone within reach;with nursing/sitter in room Nurse Communication: Mobility status PT Visit Diagnosis: Other abnormalities of gait and mobility (R26.89);Pain;Muscle weakness (generalized) (M62.81) Pain - Right/Left: Right Pain - part of body: Leg    Time: 1610-9604 PT Time Calculation (min) (ACUTE ONLY): 29 min   Charges:   PT Evaluation $PT Eval Moderate Complexity: 1 Mod   PT General Charges $$ ACUTE PT VISIT: 1 Visit         Ferd Glassing., PT  Office # [phone]   Ilda Foil 02/28/2023, 12:39 PM

## 2023-02-28 NOTE — Progress Notes (Signed)
Primary RN informed of TPN, disconnection/compromised IV tubing.  TPN discarded and D/10 started per protocol.    CCM informed.

## 2023-02-28 NOTE — Evaluation (Signed)
Occupational Therapy Evaluation Patient Details Name: Stephen Flynn MRN: [id number] DOB: 10-23-1927 Today's Date: 02/28/2023   History of Present Illness 89 y/o male admitted 8/25 with abdominal pain, SOB with bowel obstruction. 8/26 developed hypotension and required ex-lap with small bowel resection, remained intubated post op. Extubated 8/27. PMhx: GIB, stroke, HTN, Afib, neuropathy, CHF, obesity   Clinical Impression   POTA, pt lived with wife who assisted with LB ADL and home making. Upon eval, pt presents with BIL UE and LE edema, generalized weakness, attention, problem solving, and safety. Pt with soft voice and difficulty to understand at times. Pt coming to EOB using log roll technique with max A +2. Sitting EOB 6+ minutes with up to min A. Able to suction mouth with min A for initiation and then able to self initiate on future attempts. Due to significant change in functional status, motivation, and support, recommending intensive multidisciplinary rehabilitation >3 hours/day to optimize safety and independence in ADL.         If plan is discharge home, recommend the following: Two people to help with walking and/or transfers;Two people to help with bathing/dressing/bathroom;Assistance with cooking/housework;Assistance with feeding;Direct supervision/assist for medications management;Direct supervision/assist for financial management;Assist for transportation;Help with stairs or ramp for entrance    Functional Status Assessment  Patient has had a recent decline in their functional status and demonstrates the ability to make significant improvements in function in a reasonable and predictable amount of time.  Equipment Recommendations  Other (comment) (defer)    Recommendations for Other Services Rehab consult     Precautions / Restrictions Precautions Precautions: Fall;Other (comment) Precaution Comments: NG tube Restrictions Weight Bearing Restrictions: No      Mobility  Bed Mobility Overal bed mobility: Needs Assistance Bed Mobility: Rolling, Sidelying to Sit, Sit to Supine Rolling: Max assist Sidelying to sit: +2 for physical assistance, Max assist, HOB elevated   Sit to supine: +2 for physical assistance, Max assist   General bed mobility comments: Pt actively participating, contributing approx 25% of works to transition sup<>sit. Used log roll to optimize comfort    Transfers                   General transfer comment: unable, due to weakness      Balance Overall balance assessment: Needs assistance Sitting-balance support: Feet supported, Single extremity supported Sitting balance-Leahy Scale: Fair                                     ADL either performed or assessed with clinical judgement   ADL Overall ADL's : Needs assistance/impaired Eating/Feeding: NPO   Grooming: Minimal assistance;Sitting Grooming Details (indicate cue type and reason): to suction mouth. Initally min A to pick up suction and bring to mouth as well as sitting balance EOB, but then able to perform second and third trials without releasing grasp in between with min A for sitting balance only. Noting very loose grasp. Upper Body Bathing: Moderate assistance;Sitting   Lower Body Bathing: Total assistance;Sitting/lateral leans   Upper Body Dressing : Moderate assistance;Sitting   Lower Body Dressing: Total assistance;Sitting/lateral leans     Toilet Transfer Details (indicate cue type and reason): NT                 Vision   Additional Comments: Pt did not report. Will continue to assess. WFL for tasks assessed     Perception  Praxis         Pertinent Vitals/Pain Pain Assessment Pain Assessment: Faces Faces Pain Scale: Hurts little more Pain Location: abdomen, RLE Pain Descriptors / Indicators: Pressure, Discomfort, Sore Pain Intervention(s): Limited activity within patient's tolerance, Monitored during session      Extremity/Trunk Assessment Upper Extremity Assessment Upper Extremity Assessment: Generalized weakness (not formally assessed. Bil UE mild edema and observing decreased gross grasp on oral suction to wall when self suctioning mouth)   Lower Extremity Assessment Lower Extremity Assessment: Defer to PT evaluation RLE Deficits / Details: refers to it as his 'bad leg,' pain at hip and knee   Cervical / Trunk Assessment Cervical / Trunk Assessment: Kyphotic   Communication Communication Communication: Difficulty following commands/understanding;Difficulty communicating thoughts/reduced clarity of speech (low voice, jumbled words, difficult to understand at times.) Following commands: Follows one step commands inconsistently;Follows one step commands with increased time Cueing Techniques: Verbal cues;Tactile cues   Cognition Arousal: Alert Behavior During Therapy: Flat affect Overall Cognitive Status: Impaired/Different from baseline Area of Impairment: Attention, Following commands, Safety/judgement, Problem solving, Awareness                   Current Attention Level: Sustained   Following Commands: Follows one step commands with increased time, Follows one step commands inconsistently Safety/Judgement: Decreased awareness of deficits, Decreased awareness of safety Awareness: Emergent Problem Solving: Slow processing, Difficulty sequencing, Requires verbal cues, Requires tactile cues General Comments: Internally distracted by trying to cough up secretions     General Comments  VSS on 3L via [place]    Exercises     Shoulder Instructions      Home Living Family/patient expects to be discharged to:: Private residence Living Arrangements: Spouse/significant other Available Help at Discharge: Family;Available PRN/intermittently Type of Home: Apartment Home Access: Elevator     Home Layout: One level     Bathroom Shower/Tub: Chief Strategy Officer:  Standard     Home Equipment: Warden/ranger (2 wheels);Cane - single point;Tub bench;BSC/3in1;Grab bars - tub/shower;Lift chair          Prior Functioning/Environment Prior Level of Function : Needs assist             Mobility Comments: pt uses cane, RW, and wheelchair based on how he feels. Sounds like most recently has been using wheelchair frequently. Propels wheelchair with LLE. Pt has been sleeping in lift chair. ADLs Comments: wife assists with donning socks, in/out shower, and cooking        OT Problem List: Decreased strength;Decreased activity tolerance;Impaired balance (sitting and/or standing);Decreased coordination;Decreased cognition;Decreased safety awareness;Decreased knowledge of use of DME or AE;Impaired UE functional use      OT Treatment/Interventions: Self-care/ADL training;Therapeutic exercise;DME and/or AE instruction;Balance training;Patient/family education;Therapeutic activities;Cognitive remediation/compensation    OT Goals(Current goals can be found in the care plan section) Acute Rehab OT Goals Patient Stated Goal: get better OT Goal Formulation: With patient Time For Goal Achievement: 03/14/23 Potential to Achieve Goals: Good  OT Frequency: Min 2X/week    Co-evaluation              AM-PAC OT "6 Clicks" Daily Activity     Outcome Measure Help from another person eating meals?: Total Help from another person taking care of personal grooming?: A Lot Help from another person toileting, which includes using toliet, bedpan, or urinal?: Total Help from another person bathing (including washing, rinsing, drying)?: A Lot Help from another person to put on and taking off regular upper body  clothing?: A Lot Help from another person to put on and taking off regular lower body clothing?: Total 6 Click Score: 9   End of Session Nurse Communication: Mobility status;Other (comment) (RN in room to clean pt at end of session. Bed was not  plugged into wall, TPN not connected, and NG tube out of place on arrival.)  Activity Tolerance: Patient tolerated treatment well Patient left: in bed;with call bell/phone within reach;with nursing/sitter in room (nursing in room to change linnen and clean up pt)  OT Visit Diagnosis: Unsteadiness on feet (R26.81);Muscle weakness (generalized) (M62.81);Other symptoms and signs involving cognitive function                Time: 4098-1191 OT Time Calculation (min): 26 min Charges:  OT General Charges $OT Visit: 1 Visit OT Evaluation $OT Eval Moderate Complexity: 1 Mod  Tyler Deis, OTR/L [facility] Acute Rehabilitation Office: [phone]   Myrla Halsted 02/28/2023, 2:18 PM

## 2023-02-28 NOTE — Consult Note (Signed)
WOC team received consult for new placement of NPWT to midline incision after exp lap by Dr. Luisa Hart 02/26/2023.  WOC team will order supplies with plan to place today then meet with surgical PA Friday for NPWT dressing change.   Thank you,    Priscella Mann MSN, RN-BC, [employer] [phone]

## 2023-02-28 NOTE — Progress Notes (Signed)
Patient ID: Luisfernando Remache, male   DOB: 10-Mar-1928, 89 y.o.   MRN: [id number] [facility] Surgery Progress Note  2 Days Post-Op  Subjective: CC-  Extubated yesterday and down to 3L [place]. Denies any significant abdominal pain. Denies n/v. Passing some flatus, no BM. NG output low.  Objective: Vital signs in last 24 hours: Temp:  [97.9 F (36.6 C)-100 F (37.8 C)] 98.5 F (36.9 C) (08/28 0718) Pulse Rate:  [64-103] 86 (08/28 0800) Resp:  [16-27] 22 (08/28 0800) BP: (89-134)/(58-115) 131/92 (08/28 0700) SpO2:  [89 %-100 %] 95 % (08/28 0800) Arterial Line BP: (103-155)/(55-92) 145/69 (08/28 0800) FiO2 (%):  [40 %] 40 % (08/27 1300) Weight:  [99.7 kg] 99.7 kg (08/28 0415)    Intake/Output from previous day: 08/27 0701 - 08/28 0700 In: 1458.4 [I.V.:1018.6; IV Piggyback:439.8] Out: 640 [Urine:440; Emesis/NG output:200] Intake/Output this shift: Total I/O In: 138.3 [I.V.:138.3] Out: -   PE: Gen:  Alert, NAD Card:  RRR Pulm:  rate and effort normal on 3L [place] Abd: Soft, mild distension, appropriately tender, hypoactive bowel sounds, open midline with healthy granulation tissue and no erythema or drainage  Lab Results:  Recent Labs    02/27/23 0311 02/28/23 0331  WBC 1.9* 6.9  HGB 9.0* 8.5*  HCT 27.1* 25.3*  PLT 229 260   BMET Recent Labs    02/27/23 0311 02/28/23 0331  NA 139 138  K 4.1 4.0  CL 107 107  CO2 20* 23  GLUCOSE 147* 188*  BUN 23 34*  CREATININE 1.37* 1.97*  CALCIUM 8.6* 8.5*   PT/INR No results for input(s): "LABPROT", "INR" in the last 72 hours. CMP     Component Value Date/Time   NA 138 02/28/2023 0331   K 4.0 02/28/2023 0331   CL 107 02/28/2023 0331   CO2 23 02/28/2023 0331   GLUCOSE 188 (H) 02/28/2023 0331   BUN 34 (H) 02/28/2023 0331   CREATININE 1.97 (H) 02/28/2023 0331   CALCIUM 8.5 (L) 02/28/2023 0331   PROT 5.2 (L) 02/26/2023 1554   ALBUMIN 2.7 (L) 02/26/2023 1554   AST 23 02/26/2023 1554   ALT 12 02/26/2023 1554   ALKPHOS 31  (L) 02/26/2023 1554   BILITOT 0.6 02/26/2023 1554   GFRNONAA 31 (L) 02/28/2023 0331   GFRAA >60 05/16/2019 1110   Lipase     Component Value Date/Time   LIPASE 16 02/25/2023 1433       Studies/Results: DG CHEST PORT 1 VIEW  Result Date: 02/26/2023 CLINICAL DATA:  Status post central line placement EXAM: PORTABLE CHEST 1 VIEW COMPARISON:  Film from earlier in the same day. FINDINGS: Cardiac shadow is enlarged. Endotracheal tube and gastric catheter are noted in satisfactory position. Contrast is seen within the stomach. Right PICC is noted with the tip in the distal superior vena cava. Lungs are mildly hypoinflated related to the inspiratory effort. No focal confluent infiltrate is seen. IMPRESSION: New right PICC in the distal superior vena cava as described. Electronically Signed   By: Alcide Clever M.D.   On: 02/26/2023 20:06   DG Abd 1 View  Result Date: 02/26/2023 CLINICAL DATA:  NG tube placement EXAM: ABDOMEN - 1 VIEW COMPARISON:  None Available. FINDINGS: Possible small left pleural effusion. There is hazy opacity at the left lung base could represent atelectasis or infection. Cardiomegaly. Enteric tube courses below diaphragm with the side hole positioned at the level of the GE junction in the tip obscured by enteric contrast material retained in the stomach. IMPRESSION:  Enteric tube courses below diaphragm with the side hole positioned at the level of the GE junction and the tip obscured by enteric contrast material retained in the stomach. Recommend advancement. Electronically Signed   By: Lorenza Cambridge M.D.   On: 02/26/2023 17:07   Portable Chest x-ray  Result Date: 02/26/2023 CLINICAL DATA:  Ventilator dependence. Status post endotracheal tube placement. EXAM: PORTABLE CHEST 1 VIEW COMPARISON:  Earlier same day FINDINGS: Endotracheal tube tip is 4 cm above the base of the carina. The NG tube passes into the stomach although the distal tip position is not included on the film. The  cardio pericardial silhouette is enlarged. Interstitial markings are diffusely coarsened with chronic features. Retrocardiac atelectasis or infiltrate again noted. Telemetry leads overlie the chest. IMPRESSION: 1. Endotracheal tube tip is 4 cm above the base of the carina. 2. Chronic interstitial coarsening with similar retrocardiac atelectasis or infiltrate. Electronically Signed   By: Kennith Center M.D.   On: 02/26/2023 16:47   PERIPHERAL VASCULAR CATHETERIZATION  Result Date: 02/26/2023 See surgical note for result.  ECHOCARDIOGRAM COMPLETE  Result Date: 02/26/2023    ECHOCARDIOGRAM REPORT   Patient Name:   GIANNA SPRANGER Date of Exam: 02/26/2023 Medical Rec #:  [id number]     Height:       69.0 in Accession #:    [id number]    Weight:       230.0 lb Date of Birth:  December 15, 1927      BSA:          2.192 m Patient Age:    89 years      BP:           99/80 mmHg Patient Gender: M             HR:           84 bpm. Exam Location:  Inpatient Procedure: 2D Echo, Color Doppler and Cardiac Doppler Indications:    Afib  History:        Patient has prior history of Echocardiogram examinations, most                 recent 03/22/2019. CHF; Risk Factors:Diabetes, Dyslipidemia and                 Hypertension.  Sonographer:    Milbert Coulter Referring Phys: [id number] Boyce Medici GONFA  Sonographer Comments: Image acquisition challenging due to respiratory motion and pleural effusion and hard abdomen presumably from SBO. IMPRESSIONS  1. Left ventricular ejection fraction, by estimation, is 40 to 45%. The left ventricle has mildly decreased function. The left ventricle demonstrates global hypokinesis. There is moderate concentric left ventricular hypertrophy. Left ventricular diastolic function could not be evaluated.  2. Right ventricular systolic function is normal. The right ventricular size is normal. There is normal pulmonary artery systolic pressure. The estimated right ventricular systolic pressure is 31.7 mmHg.  3. Left atrial  size was mildly dilated.  4. The mitral valve is degenerative. Trivial mitral valve regurgitation. No evidence of mitral stenosis.  5. The aortic valve is tricuspid. Aortic valve regurgitation is mild. Aortic valve sclerosis/calcification is present, without any evidence of aortic stenosis. Aortic valve area, by VTI measures 2.28 cm. Aortic valve mean gradient measures 5.0 mmHg. Aortic valve Vmax measures 1.41 m/s.  6. The inferior vena cava is normal in size with greater than 50% respiratory variability, suggesting right atrial pressure of 3 mmHg.  7. Ascending aorta measurements are within normal limits for age when indexed  to body surface area. FINDINGS  Left Ventricle: Left ventricular ejection fraction, by estimation, is 40 to 45%. The left ventricle has mildly decreased function. The left ventricle demonstrates global hypokinesis. The left ventricular internal cavity size was normal in size. There is  moderate concentric left ventricular hypertrophy. Abnormal (paradoxical) septal motion, consistent with left bundle branch block. Left ventricular diastolic function could not be evaluated due to atrial fibrillation. Left ventricular diastolic function could not be evaluated. Right Ventricle: The right ventricular size is normal. No increase in right ventricular wall thickness. Right ventricular systolic function is normal. There is normal pulmonary artery systolic pressure. The tricuspid regurgitant velocity is 2.68 m/s, and  with an assumed right atrial pressure of 3 mmHg, the estimated right ventricular systolic pressure is 31.7 mmHg. Left Atrium: Left atrial size was mildly dilated. Right Atrium: Right atrial size was normal in size. Pericardium: Trivial pericardial effusion is present. The pericardial effusion is posterior to the left ventricle. Mitral Valve: The mitral valve is degenerative in appearance. There is mild thickening of the mitral valve leaflet(s). There is mild calcification of the mitral  valve leaflet(s). Mild mitral annular calcification. Trivial mitral valve regurgitation. No evidence of mitral valve stenosis. Tricuspid Valve: The tricuspid valve is normal in structure. Tricuspid valve regurgitation is mild . No evidence of tricuspid stenosis. Aortic Valve: The aortic valve is tricuspid. Aortic valve regurgitation is mild. Aortic valve sclerosis/calcification is present, without any evidence of aortic stenosis. Aortic valve mean gradient measures 5.0 mmHg. Aortic valve peak gradient measures 8.0 mmHg. Aortic valve area, by VTI measures 2.28 cm. Pulmonic Valve: The pulmonic valve was normal in structure. Pulmonic valve regurgitation is not visualized. No evidence of pulmonic stenosis. Aorta: The aortic root is normal in size and structure. Ascending aorta measurements are within normal limits for age when indexed to body surface area. Venous: The inferior vena cava is normal in size with greater than 50% respiratory variability, suggesting right atrial pressure of 3 mmHg. IAS/Shunts: No atrial level shunt detected by color flow Doppler.  LEFT VENTRICLE PLAX 2D LVIDd:         4.10 cm LVIDs:         3.50 cm LV PW:         1.50 cm LV IVS:        1.30 cm LVOT diam:     2.20 cm LV SV:         48 LV SV Index:   22 LVOT Area:     3.80 cm  RIGHT VENTRICLE RV Basal diam:  3.20 cm RV S prime:     21.40 cm/s TAPSE (M-mode): 1.9 cm LEFT ATRIUM             Index        RIGHT ATRIUM           Index LA diam:        3.20 cm 1.46 cm/m   RA Area:     20.60 cm LA Vol (A2C):   79.7 ml 36.35 ml/m  RA Volume:   49.40 ml  22.53 ml/m LA Vol (A4C):   88.2 ml 40.23 ml/m LA Biplane Vol: 85.3 ml 38.91 ml/m  AORTIC VALVE AV Area (Vmax):    2.55 cm AV Area (Vmean):   2.29 cm AV Area (VTI):     2.28 cm AV Vmax:           141.00 cm/s AV Vmean:  104.000 cm/s AV VTI:            0.210 m AV Peak Grad:      8.0 mmHg AV Mean Grad:      5.0 mmHg LVOT Vmax:         94.70 cm/s LVOT Vmean:        62.650 cm/s LVOT VTI:           0.126 m LVOT/AV VTI ratio: 0.60  AORTA Ao Root diam: 4.00 cm Ao Asc diam:  4.10 cm TRICUSPID VALVE TR Peak grad:   28.7 mmHg TR Vmax:        268.00 cm/s  SHUNTS Systemic VTI:  0.13 m Systemic Diam: 2.20 cm Armanda Magic MD Electronically signed by Armanda Magic MD Signature Date/Time: 02/26/2023/1:17:32 PM    Final    DG Chest Port 1 View  Result Date: 02/26/2023 CLINICAL DATA:  Hypoxia.Acute onset of chest pain for 1 hour. Shortness of breath EXAM: PORTABLE CHEST 1 VIEW COMPARISON:  12/15/2022. FINDINGS: Interval placement of ET tube. The tip enters the left mainstem bronchus. Recommend withdrawing by approximately 3.5 cm. Stable cardiomediastinal contours. Decreased lung volumes compared with previous exam. Retrocardiac opacification in the left lower lung is new and may reflect left lower lobe atelectasis. Enteric contrast material is again noted within the stomach. IMPRESSION: 1. Interval placement of ET tube. The tip enters the left mainstem bronchus. Recommend withdrawing by approximately 3.5 cm. 2. New retrocardiac opacification in the left lower lung may reflect left lower lobe atelectasis. These results will be called to the ordering clinician or representative by the Radiologist Assistant, and communication documented in the PACS or [employer]. Electronically Signed   By: Signa Kell M.D.   On: 02/26/2023 11:33   [place] EKG SITE RITE  Result Date: 02/26/2023 If Site Rite image not attached, placement could not be confirmed due to current cardiac rhythm.   Anti-infectives: Anti-infectives (From admission, onward)    Start     Dose/Rate Route Frequency Ordered Stop   02/26/23 1445  piperacillin-tazobactam (ZOSYN) IVPB 3.375 g  Status:  Discontinued        3.375 g 100 mL/hr over 30 Minutes Intravenous Every 8 hours 02/26/23 1436 02/26/23 1445   02/26/23 1445  ceFEPIme (MAXIPIME) 2 g in sodium chloride 0.9 % 100 mL IVPB        2 g 200 mL/hr over 30 Minutes Intravenous Every 12 hours  02/26/23 1446 03/03/23 2359   02/26/23 1445  metroNIDAZOLE (FLAGYL) IVPB 500 mg        500 mg 100 mL/hr over 60 Minutes Intravenous Every 12 hours 02/26/23 1446 03/03/23 2359   02/26/23 1015  meropenem (MERREM) 1 g in sodium chloride 0.9 % 100 mL IVPB        1 g 200 mL/hr over 30 Minutes Intravenous  Once 02/26/23 0917 02/26/23 1743        Assessment/Plan Small bowel obstruction involving a medial adhesion across the distal jejunum  -POD#2 s/p Exploratory laparotomy small bowel resection and primary anastomosis 8/26 Dr. Luisa Hart - approximately 120 cm of small bowel remaining  - continue NPO/NGT to LIWS and await return in bowel function. Continue TPN - will ask WOC to place abdominal wound vac - PT/OT consult pending   FEN - IVF, NPO/NGT to LIWS, TPN VTE - heparin gtt ID - maxipime/flagyl 8/26>> continue at least 5 days postop Foley - continue for strict I&O   AKI ABL anemia A fib RVR - improved. On  amiodarone drip, heparin gtt Wheelchair/walker bound HTN H/O CVA HLD Diastolic CHF DM       LOS: 3 days    Franne Forts, [facility] Surgery 02/28/2023, 8:41 AM Please see Amion for pager number during day hours 7:00am-4:30pm

## 2023-02-28 NOTE — Progress Notes (Signed)
Inpatient Rehab Admissions Coordinator:   Per therapy recommendations, patient was screened for CIR candidacy by Megan Salon, MS, CCC-SLP. At this time, Pt. is not yet attempting transfers and not a level to tolerate the intensity of CIR; however,  Pt. may have potential to progress to becoming a potential CIR candidate, so CIR admissions team will follow and monitor for progress and participation with therapies and place consult order if Pt. appears to be an appropriate candidate. Please contact me with any questions.   Megan Salon, MS, CCC-SLP Rehab Admissions Coordinator  [phone] (celll) [phone] (office)

## 2023-02-28 NOTE — Progress Notes (Signed)
NAMEChigozie Flynn, MRN:  [id number], DOB:  28-May-1928, LOS: 3 ADMISSION DATE:  02/25/2023, CONSULTATION DATE: 8/26 REFERRING MD: Dr. Randol Kern, CHIEF COMPLAINT: Hypotension  History of Present Illness:  Patient is encephalopathic and/or intubated; therefore, history has been obtained from chart review.  89 year old male with past medical history as below, which is significant for atrial fibrillation on Eliquis, wheelchair-bound, HFpEF, and stroke who presented to [facility] emergency department on 8/25 with complaints of acute onset abdominal pain with no bowel movement for 3 days prior to presentation.  Also sounds like he had not been consistently taking his Eliquis.  Upon arrival to the emergency department the patient was noted to be in atrial fibrillation with RVR to the 120s.  Started on diltiazem infusion and placed on heparin drip.  CT of the abdomen and pelvis demonstrated small bowel obstruction and moderate ascites.  General surgery was consulted and recommended conservative management with NG tube decompression.  He was admitted to the hospitalist service.  In the a.m. hours of 8/26 the patient developed waning mental status and became nonverbal.  He also developed hypotension with systolic pressures in the 70s and desaturation to the 80s.  His abdomen became more firm and rigid and exquisitely tender to pain.  The surgical team was at bedside and determined the patient would likely require urgent surgery.  PCCM was consulted for hypotension and hypoxia.  Pertinent  Medical History   has a past medical history of Anemia, Aortic insufficiency, Chest pain, H/O: GI bleed, Hyperlipidemia, Hypertension, Leg cramps, and Stroke (HCC).   Significant Hospital Events: Including procedures, antibiotic start and stop dates in addition to other pertinent events   8/25 admit for SBO 8/26 acute abdomen, tx to ICU for pressors. PICC line placed. Underwent Ex lap and small bowel resection 8/27  Extubated  Interim History / Subjective:   Extubated yesterday.  No acute events overnight.  Objective   Blood pressure (!) 131/92, pulse 84, temperature 98.5 F (36.9 C), temperature source Oral, resp. rate (!) 25, height 5\' 9"  (1.753 m), weight 99.7 kg, SpO2 96%.    Vent Mode: PSV;CPAP FiO2 (%):  [40 %] 40 % PEEP:  [5 cmH20] 5 cmH20 Pressure Support:  [5 cmH20] 5 cmH20   Intake/Output Summary (Last 24 hours) at 02/28/2023 0747 Last data filed at 02/28/2023 0600 Gross per 24 hour  Intake 1458.37 ml  Output 640 ml  Net 818.37 ml   Filed Weights   02/26/23 0915 02/27/23 0443 02/28/23 0415  Weight: 104.3 kg 99.7 kg 99.7 kg    Examination: Gen:      No acute distress HEENT:  EOMI, sclera anicteric Neck:     No masses; no thyromegaly Lungs:    Clear to auscultation bilaterally; normal respiratory effort CV:         Regular rate and rhythm; no murmurs Abd:      Abdominal wound dressing Skin:      Warm and dry; no rash Neuro: Awake, responsive  Lab/imaging reviewed Significant for BUN/creatinine 34/1.97 WBC 6.9, hemoglobin 8.5, platelets 260 No new imaging  Resolved Hospital Problem list     Assessment & Plan:  Small bowel obstruction s/p ex lap and small bowel resection.  Acute abdomen: Continue nasogastric tube to low intermittent suction Wound care TPN for nutrition Surgery is managing.   Septic shock Off pressors  Zosyn to cover for intraabdominal infection for 5 days  Acute respiratory failure secondary to aspiration Stable postextubation.  Wean down oxygen as tolerated  Atrial fibrillation with rapid ventricular response Continue amiodarone drip  Heparin in lieu of home Eliquis  Chronic HFpEF Telemetry monitoring Echo reviewed with 40-45%. Moderate LVH,   Diabetes mellitus CBG monitoring and sliding scale insulin  Baseline debility: Wheelchair-bound Full code per family  History of stroke Can resume home therapies when able to take p.o.  Best  Practice (right click and "Reselect all SmartList Selections" daily)   Diet/type: NPO DVT prophylaxis: systemic heparin GI prophylaxis: PPI Lines: Central line and Arterial Line Foley:  Yes, and it is still needed Code Status:  full code Last date of multidisciplinary goals of care discussion [02/26/23]  Critical care time:    The patient is critically ill with multiple organ system failure and requires high complexity decision making for assessment and support, frequent evaluation and titration of therapies, advanced monitoring, review of radiographic studies and interpretation of complex data.   Critical Care Time devoted to patient care services, exclusive of separately billable procedures, described in this note is 35 minutes.   Chilton Greathouse MD [facility] See Amion for pager  If no response to pager , please call [phone] until 7pm After 7:00 pm call Elink  [phone] 02/28/2023, 7:47 AM

## 2023-02-28 NOTE — Consult Note (Signed)
WOC Nurse Consult Note: Reason for Consult: place NPWT  Wound type: full thickness surgical  Pressure Injury POA: NA  Measurement: 11 cm x 2.5 cm x 2.5 cm  Wound bed: 75% pink and moist 25% scattered yellow fibrin/subcutaneous tissue; one black suture noted approximately 5 cms from most superior aspect  Drainage (amount, consistency, odor) minimal serosanguinous  Periwound:intact, crease noted around umbilicus to left of incision; 1/2 of a barrier ring flattened and placed in crease to assist with seal   Dressing procedure/placement/frequency: Removed wet to dry dressing that was in place. Cleansed wound with normal saline Barrier ring flattened and placed in umbilical crease as above.  Placed a small piece of Mepitel over suture, filled wound with 1 piece black foam  Sealed NPWT dressing at HG Patient received IV pain medication per bedside nurse prior to dressing change Patient tolerated procedure well  WOC nurse will continue to provide NPWT dressing changes M-W-F due to the complexity of the dressing change. Next dressing change w/Brooke Meuth PA CCS on Friday 03/02/2023    Thank you,    Priscella Mann MSN, RN-BC, [employer] [phone]

## 2023-02-28 NOTE — Progress Notes (Signed)
Patient accidentally removed NG tube. Reine Just, PA notified and gave verbal order to reinsert. NG placed in right nare at 69 cm and KUB obtained to verify placement.

## 2023-03-01 ENCOUNTER — Inpatient Hospital Stay (HOSPITAL_COMMUNITY): Payer: 59

## 2023-03-01 DIAGNOSIS — K56609 Unspecified intestinal obstruction, unspecified as to partial versus complete obstruction: Secondary | ICD-10-CM | POA: Diagnosis not present

## 2023-03-01 LAB — BLOOD GAS, ARTERIAL
Acid-base deficit: 3.1 mmol/L — ABNORMAL HIGH (ref 0.0–2.0)
Acid-base deficit: 2.8 mmol/L — ABNORMAL HIGH (ref 0.0–2.0)
Bicarbonate: 25.8 mmol/L (ref 20.0–28.0)
Bicarbonate: 23.2 mmol/L (ref 20.0–28.0)
Drawn by: 59094
Drawn by: 59094
O2 Saturation: 95.8 %
O2 Saturation: 99.9 %
Patient temperature: 36.4
Patient temperature: 36.2
pCO2 arterial: 61 mmHg — ABNORMAL HIGH (ref 32–48)
pCO2 arterial: 43 mmHg (ref 32–48)
pH, Arterial: 7.23 — ABNORMAL LOW (ref 7.35–7.45)
pH, Arterial: 7.34 — ABNORMAL LOW (ref 7.35–7.45)
pO2, Arterial: 98 mmHg (ref 83–108)
pO2, Arterial: 94 mmHg (ref 83–108)

## 2023-03-01 LAB — GLUCOSE, CAPILLARY
Glucose-Capillary: 160 mg/dL — ABNORMAL HIGH (ref 70–99)
Glucose-Capillary: 140 mg/dL — ABNORMAL HIGH (ref 70–99)
Glucose-Capillary: 149 mg/dL — ABNORMAL HIGH (ref 70–99)
Glucose-Capillary: 141 mg/dL — ABNORMAL HIGH (ref 70–99)
Glucose-Capillary: 139 mg/dL — ABNORMAL HIGH (ref 70–99)
Glucose-Capillary: 152 mg/dL — ABNORMAL HIGH (ref 70–99)

## 2023-03-01 LAB — HEPARIN LEVEL (UNFRACTIONATED)
Heparin Unfractionated: 0.21 [IU]/mL — ABNORMAL LOW (ref 0.30–0.70)
Heparin Unfractionated: <0.1 [IU]/mL — ABNORMAL LOW (ref 0.30–0.70)
Heparin Unfractionated: 0.19 [IU]/mL — ABNORMAL LOW (ref 0.30–0.70)

## 2023-03-01 LAB — CBC
HCT: 23.9 % — ABNORMAL LOW (ref 39.0–52.0)
HCT: 24.8 % — ABNORMAL LOW (ref 39.0–52.0)
Hemoglobin: 7.9 g/dL — ABNORMAL LOW (ref 13.0–17.0)
Hemoglobin: 8.1 g/dL — ABNORMAL LOW (ref 13.0–17.0)
MCH: 30.6 pg (ref 26.0–34.0)
MCH: 30.6 pg (ref 26.0–34.0)
MCHC: 33.1 g/dL (ref 30.0–36.0)
MCHC: 32.7 g/dL (ref 30.0–36.0)
MCV: 92.6 fL (ref 80.0–100.0)
MCV: 93.6 fL (ref 80.0–100.0)
Platelets: 260 10*3/uL (ref 150–400)
Platelets: 264 K/uL (ref 150–400)
RBC: 2.58 MIL/uL — ABNORMAL LOW (ref 4.22–5.81)
RBC: 2.65 MIL/uL — ABNORMAL LOW (ref 4.22–5.81)
RDW: 15.3 % (ref 11.5–15.5)
RDW: 15.6 % — ABNORMAL HIGH (ref 11.5–15.5)
WBC: 9.4 10*3/uL (ref 4.0–10.5)
WBC: 9.5 K/uL (ref 4.0–10.5)
nRBC: 0 % (ref 0.0–0.2)
nRBC: 0 % (ref 0.0–0.2)

## 2023-03-01 LAB — PHOSPHORUS
Phosphorus: 3.1 mg/dL (ref 2.5–4.6)
Phosphorus: 2.8 mg/dL (ref 2.5–4.6)

## 2023-03-01 LAB — MAGNESIUM: Magnesium: 2.2 mg/dL (ref 1.7–2.4)

## 2023-03-01 LAB — COMPREHENSIVE METABOLIC PANEL
ALT: 18 U/L (ref 0–44)
AST: 25 U/L (ref 15–41)
Albumin: 2.5 g/dL — ABNORMAL LOW (ref 3.5–5.0)
Alkaline Phosphatase: 34 U/L — ABNORMAL LOW (ref 38–126)
Anion gap: 8 (ref 5–15)
BUN: 39 mg/dL — ABNORMAL HIGH (ref 8–23)
CO2: 25 mmol/L (ref 22–32)
Calcium: 8.8 mg/dL — ABNORMAL LOW (ref 8.9–10.3)
Chloride: 105 mmol/L (ref 98–111)
Creatinine, Ser: 1.56 mg/dL — ABNORMAL HIGH (ref 0.61–1.24)
GFR, Estimated: 41 mL/min — ABNORMAL LOW (ref >60)
Glucose, Bld: 145 mg/dL — ABNORMAL HIGH (ref 70–99)
Potassium: 3.4 mmol/L — ABNORMAL LOW (ref 3.5–5.1)
Sodium: 138 mmol/L (ref 135–145)
Total Bilirubin: 0.4 mg/dL (ref 0.3–1.2)
Total Protein: 5.6 g/dL — ABNORMAL LOW (ref 6.5–8.1)

## 2023-03-01 LAB — POCT I-STAT 7, (LYTES, BLD GAS, ICA,H+H)
Acid-base deficit: 3 mmol/L — ABNORMAL HIGH (ref 0.0–2.0)
Bicarbonate: 24.1 mmol/L (ref 20.0–28.0)
Calcium, Ion: 1.34 mmol/L (ref 1.15–1.40)
HCT: 32 % — ABNORMAL LOW (ref 39.0–52.0)
Hemoglobin: 10.9 g/dL — ABNORMAL LOW (ref 13.0–17.0)
O2 Saturation: 98 %
Patient temperature: 97.5
Potassium: 4.2 mmol/L (ref 3.5–5.1)
Sodium: 140 mmol/L (ref 135–145)
TCO2: 26 mmol/L (ref 22–32)
pCO2 arterial: 50.1 mmHg — ABNORMAL HIGH (ref 32–48)
pH, Arterial: 7.287 — ABNORMAL LOW (ref 7.35–7.45)
pO2, Arterial: 123 mmHg — ABNORMAL HIGH (ref 83–108)

## 2023-03-01 MED ORDER — SODIUM CHLORIDE 0.9 % IV SOLN
2.0000 g | Freq: Two times a day (BID) | INTRAVENOUS | Status: AC
  Administered 2023-03-01 – 2023-03-02 (×2): 2 g via INTRAVENOUS
  Filled 2023-03-01 (×2): qty 12.5

## 2023-03-01 MED ORDER — TRAVASOL 10 % IV SOLN
INTRAVENOUS | Status: AC
  Filled 2023-03-01: qty 1044

## 2023-03-01 MED ORDER — POTASSIUM CHLORIDE 10 MEQ/50ML IV SOLN
10.0000 meq | INTRAVENOUS | Status: AC
  Administered 2023-03-01 (×6): 10 meq via INTRAVENOUS
  Filled 2023-03-01: qty 50

## 2023-03-01 MED ORDER — TRAVASOL 10 % IV SOLN: INTRAVENOUS | Status: DC

## 2023-03-01 NOTE — Progress Notes (Signed)
PCCM note  When attempting to transfer patient out of ICU he became very tachypneic ABG shows hypercarbia. Will keep him in the ICU overnight.  Starting BiPAP. Follow repeat ABG  Chilton Greathouse MD [facility] See Amion for pager  If no response to pager , please call [phone] until 7pm After 7:00 pm call Elink  [phone] 03/01/2023, 2:32 PM

## 2023-03-01 NOTE — Progress Notes (Signed)
eLink Physician-Brief Progress Note Patient Name: Stephen Flynn DOB: [birth year] MRN: [id number]   Date of Service  03/01/2023  HPI/Events of Note  K3.4, on TPN  eICU Interventions  KCl ordered IV. TPN adjustment per primary team     Intervention Category Minor Interventions: Electrolytes abnormality - evaluation and management  Tomislav Micale 03/01/2023, 4:13 AM

## 2023-03-01 NOTE — Anesthesia Postprocedure Evaluation (Signed)
Anesthesia Post Note  Patient: Stephen Flynn  Procedure(s) Performed: EXPLORATORY LAPAROTOMY (Abdomen) SMALL BOWEL RESECTION (Abdomen)     Patient location during evaluation: ICU Anesthesia Type: General Level of consciousness: lethargic Pain management: pain level controlled Vital Signs Assessment: post-procedure vital signs reviewed and stable Respiratory status: pt extubated 8/27. but now hypercarbic, somnolent and back on BiPAP. Cardiovascular status: stable Postop Assessment: no apparent nausea or vomiting Anesthetic complications: no Comments: Pt remains in ICU, critically ill post op small bowel resection   No notable events documented.  Last Vitals:  Vitals:   03/01/23 1800 03/01/23 1934  BP: (!) 143/88   Pulse: 89   Resp: (!) 24   Temp:  (!) 36.4 C  SpO2: 100%     Last Pain:  Vitals:   03/01/23 1934  TempSrc: Oral  PainSc:                  Kirsta Probert,E. Lizmary Nader

## 2023-03-01 NOTE — Progress Notes (Addendum)
PHARMACY - TOTAL PARENTERAL NUTRITION, ANTIBIOTICS, ANTICOAGULATION CONSULT NOTE   Indication: Small bowel obstruction and Short bowel syndrome  Patient Measurements: Height: 5\' 9"  (175.3 cm) Weight: 99.7 kg (219 lb 12.8 oz) IBW/kg (Calculated) : 70.7   Body mass index is 32.46 kg/m. Usual Weight: 104 kg at admission  Assessment: Stephen Flynn admitted 8/25 for abdominal pain after eating breakfast, found with small bowel obstruction. Exlap performed 8/26, patient in continuity however large amount of bowel resected with ~120 cm remaining. Pharmacy consulted to initiate TPN.  Glucose / Insulin: BG 145 - 160, A1c 6.5; 10u SSI over 24h - 8/28 TPN became disconnected, D10 started briefly until new TPN was hung Electrolytes: Na 138, K 3.4, Cl 105, CO2 25, 8/28 iCa 1.19, CoCa 10, Mg 2.2, P 3.1 Renal: Scr up 1.56 (b/l ~0.9), BUN 39, 0.3 ml/kg/h UOP Hepatic: 8/26 AST/ALT wnl, Tbili 0.6, Alkphos 31, Albumin 2.7 Intake / Output; MIVF: Net +6.5L GI Imaging: 8/29 Abd [place]: mild stool, no abnormal dilatation GI Surgeries / Procedures:  No procedures since initiation of TPN  Central access: PICC triple lumen TPN start date: 8/27  Nutritional Goals: Goal TPN rate is 75 mL/hr (provides 104.4 g of protein and 1832 kcals per day)  RD Assessment: Estimated Needs Total Energy Estimated Needs: 1800-2000 kcal/d Total Protein Estimated Needs: 90-105g/d Total Fluid Estimated Needs: 1.8L/d  Current Nutrition:  NPO and TPN  Plan:  Continue TPN at goal rate of 75 mL/hr at 1800 (provides 104.4 g protein, 1814.4 kcal), which will be 100% of goal needs Electrolytes in TPN: Na 47mEq/L, K 40 mEq/L, Ca 3 mEq/L, Mg 5 mEq/L, and Phos 12 mmol/L. Cl:Ac 1:2 Add standard MVI and trace elements to TPN Continue Moderate q4h SSI and adjust as needed  Monitor TPN labs on Mon/Thurs, PRN  Supplement thiamine 100 mg IV x 5 days outside of TPN Adjust to cefepime 2g q12h for CrCl 34.4 F/u heparin level at 1200  Rutherford Nail, PharmD PGY2 Critical Care Pharmacy Resident 03/01/2023,10:56 AM

## 2023-03-01 NOTE — Progress Notes (Signed)
ANTICOAGULATION CONSULT NOTE- Follow Up  Pharmacy Consult for heparin Indication: atrial fibrillation  Allergies  Allergen Reactions   Penicillin G     Other reaction(s): Drowsy   Penicillins Hives    Did it involve swelling of the face/tongue/throat, SOB, or low BP? No Did it involve sudden or severe rash/hives, skin peeling, or any reaction on the inside of your mouth or nose? yes Did you need to seek medical attention at a hospital or doctor's office? yes When did it last happen?  child     If all above answers are "NO", may proceed with cephalosporin use.    Patient Measurements: Height: 5\' 9"  (175.3 cm) Weight: 99.7 kg (219 lb 12.8 oz) IBW/kg (Calculated) : 70.7 Heparin Dosing Weight: 89kg  Vital Signs: Temp: 97.5 F (36.4 C) (08/29 1111) Temp Source: Axillary (08/29 1111) BP: 115/77 (08/29 1409) Pulse Rate: 83 (08/29 1420)  Labs: Recent Labs    02/27/23 0311 02/27/23 1257 02/28/23 0331 03/01/23 0322 03/01/23 1302  HGB 9.0*  --  8.5* 7.9* 8.1*  HCT 27.1*  --  25.3* 23.9* 24.8*  PLT 229  --  260 260 264  HEPARINUNFRC  --    < > 0.31 0.21* <0.10*  CREATININE 1.37*  --  1.97* 1.56*  --    < > = values in this interval not displayed.    Estimated Creatinine Clearance: 34.4 mL/min (A) (by C-G formula based on SCr of 1.56 mg/dL (H)).   Medical History: Past Medical History:  Diagnosis Date   Anemia    Aortic insufficiency    Chest pain    H/O: GI bleed    Hyperlipidemia    Hypertension    Leg cramps    Stroke [facility])     Assessment: 51 YOM presenting with epigastric pain, on xarelto PTA for afib with last dose taken last week sometime per pt report (previously switched from Eliquis to Xarelto for GI issues, however pt reports having more issues and stopping Xarelto a few days prior to admission which he attributed to the drug)  8/29 1200 level: Supratherapeutic at <0.1, discussed with RN, noted swelling around the site of infusion with possible  infiltration of the line/not appropriately infusing. Reconnected heparin to infuse at a different PIV.  Goal of Therapy:  Heparin level 0.3-0.7 units/ml Monitor platelets by anticoagulation protocol: Yes   Plan:  Continue heparin at 1400 u/h Follow up repeat level at 2200 Daily heparin level, CBC, s/s bleeding  Rutherford Nail, PharmD PGY2 Critical Care Pharmacy Resident 03/01/2023 2:51 PM

## 2023-03-01 NOTE — Progress Notes (Signed)
ANTICOAGULATION CONSULT NOTE- Follow Up  Pharmacy Consult for heparin Indication: atrial fibrillation  Allergies  Allergen Reactions   Penicillin G     Other reaction(s): Drowsy   Penicillins Hives    Did it involve swelling of the face/tongue/throat, SOB, or low BP? No Did it involve sudden or severe rash/hives, skin peeling, or any reaction on the inside of your mouth or nose? yes Did you need to seek medical attention at a hospital or doctor's office? yes When did it last happen?  child     If all above answers are "NO", may proceed with cephalosporin use.    Patient Measurements: Height: 5\' 9"  (175.3 cm) Weight: 99.7 kg (219 lb 12.8 oz) IBW/kg (Calculated) : 70.7 Heparin Dosing Weight: 89kg  Vital Signs: Temp: 97.7 F (36.5 C) (08/29 0344) Temp Source: Axillary (08/29 0344) BP: 123/74 (08/29 0300) Pulse Rate: 87 (08/29 0300)  Labs: Recent Labs    02/26/23 0431 02/26/23 0818 02/26/23 0907 02/26/23 1440 02/26/23 1554 02/26/23 1638 02/27/23 0311 02/27/23 1257 02/27/23 2057 02/28/23 0331 03/01/23 0322  HGB  --    < >  --    < > 9.7*   < > 9.0*  --   --  8.5* 7.9*  HCT  --    < >  --    < > 30.2*   < > 27.1*  --   --  25.3* 23.9*  PLT  --    < >  --   --  255  --  229  --   --  260 260  APTT 49*  --  72*  --   --   --   --   --   --   --   --   HEPARINUNFRC 0.20*  --  0.35  --   --   --   --    < > 0.40 0.31 0.21*  CREATININE 1.17  --   --   --  1.29*  --  1.37*  --   --  1.97*  --    < > = values in this interval not displayed.    Estimated Creatinine Clearance: 27.3 mL/min (A) (by C-G formula based on SCr of 1.97 mg/dL (H)).   Medical History: Past Medical History:  Diagnosis Date   Anemia    Aortic insufficiency    Chest pain    H/O: GI bleed    Hyperlipidemia    Hypertension    Leg cramps    Stroke [facility])     Assessment: 64 YOM presenting with epigastric pain, on xarelto PTA for afib with last dose taken last week sometime per pt report  (previously switched from Eliquis to Xarelto for GI issues, however pt reports having more issues and stopping Xarelto a few days prior to admission which he attributed to the drug)  8/29 AM: Heparin level returned at 0.21 on 1250 units/hr (subtherapeutic). Per RN, no issues drawing the heparin lab (was drawn from same line-heparin paused for ~5 minutes and line flushed), no pauses in the infusion, or signs/symptoms of bleeding given drop in Hgb.  Goal of Therapy:  Heparin level 0.3-0.7 units/ml Monitor platelets by anticoagulation protocol: Yes   Plan:  Increase heparin gtt to 1400 units/hr 8h heparin level  Daily heparin level, CBC, s/s bleeding  Arabella Merles, PharmD. Clinical Pharmacist 03/01/2023 4:04 AM

## 2023-03-01 NOTE — Progress Notes (Signed)
   03/01/23 1420  BiPAP/CPAP/SIPAP  $ Non-Invasive Ventilator  Non-Invasive Vent Initial  $ Face Mask Large  Yes  BiPAP/CPAP/SIPAP Pt Type Adult  BiPAP/CPAP/SIPAP V60  Mask Type Full face mask  Set Rate 8 breaths/min  Respiratory Rate 28 breaths/min  Pressure Support 12 cmH20  PEEP 6 cmH20  FiO2 (%) 30 %  Flow Rate 0.9 lpm  Patient Home Equipment No  Auto Titrate No  Press High Alarm 25 cmH2O  Press Low Alarm 5 cmH2O  Oxygen Percent 30 %  BiPAP/CPAP /SiPAP Vitals  Pulse Rate 83  Resp 18  SpO2 100 %  Bilateral Breath Sounds Diminished;Rhonchi  MEWS Score/Color  MEWS Score 0  MEWS Score Color Green   Pt placed on BIPAP after obtaining ABG. Pt tolerating well at this time.

## 2023-03-01 NOTE — Progress Notes (Signed)
NAMEKieron Flynn, MRN:  [id number], DOB:  Sep 17, 1927, LOS: 4 ADMISSION DATE:  02/25/2023, CONSULTATION DATE: 8/26 REFERRING MD: Dr. Randol Kern, CHIEF COMPLAINT: Hypotension  History of Present Illness:  Patient is encephalopathic and/or intubated; therefore, history has been obtained from chart review.  89 year old male with past medical history as below, which is significant for atrial fibrillation on Eliquis, wheelchair-bound, HFpEF, and stroke who presented to [facility] emergency department on 8/25 with complaints of acute onset abdominal pain with no bowel movement for 3 days prior to presentation.  Also sounds like he had not been consistently taking his Eliquis.  Upon arrival to the emergency department the patient was noted to be in atrial fibrillation with RVR to the 120s.  Started on diltiazem infusion and placed on heparin drip.  CT of the abdomen and pelvis demonstrated small bowel obstruction and moderate ascites.  General surgery was consulted and recommended conservative management with NG tube decompression.  He was admitted to the hospitalist service.  In the a.m. hours of 8/26 the patient developed waning mental status and became nonverbal.  He also developed hypotension with systolic pressures in the 70s and desaturation to the 80s.  His abdomen became more firm and rigid and exquisitely tender to pain.  The surgical team was at bedside and determined the patient would likely require urgent surgery.  PCCM was consulted for hypotension and hypoxia.  Pertinent  Medical History   has a past medical history of Anemia, Aortic insufficiency, Chest pain, H/O: GI bleed, Hyperlipidemia, Hypertension, Leg cramps, and Stroke (HCC).   Significant Hospital Events: Including procedures, antibiotic start and stop dates in addition to other pertinent events   8/25 admit for SBO 8/26 acute abdomen, tx to ICU for pressors. PICC line placed. Underwent Ex lap and small bowel resection 8/27  Extubated 8/27  Interim History / Subjective:   Continues on TPN.  No acute events overnight.  Objective   Blood pressure 118/77, pulse 81, temperature 97.7 F (36.5 C), temperature source Axillary, resp. rate (!) 21, height 5\' 9"  (1.753 m), weight 99.7 kg, SpO2 96%.        Intake/Output Summary (Last 24 hours) at 03/01/2023 0755 Last data filed at 03/01/2023 0600 Gross per 24 hour  Intake 2410.09 ml  Output 750 ml  Net 1660.09 ml   Filed Weights   02/27/23 0443 02/28/23 0415 03/01/23 0344  Weight: 99.7 kg 99.7 kg 99.7 kg    Examination: Gen:      No acute distress HEENT:  EOMI, sclera anicteric Neck:     No masses; no thyromegaly Lungs:    Clear to auscultation bilaterally; normal respiratory effort CV:         Regular rate and rhythm; no murmurs Abd:   Abdominal wound dressing Ext:    No edema; adequate peripheral perfusion Skin:      Warm and dry; no rash Neuro: Awake, responsive  Lab/imaging reviewed Significant for potassium 3.4, BUN/creatinine 39/1.56, AST 25, ALT 18 Hemoglobin 7.9, platelets 260  Resolved Hospital Problem list     Assessment & Plan:  Small bowel obstruction s/p ex lap and small bowel resection.  Acute abdomen: Continue nasogastric tube to low intermittent suction Wound care TPN for nutrition Surgery is managing.   Septic shock Off pressors  Zosyn to cover for intraabdominal infection for 5 days  Acute respiratory failure secondary to aspiration Stable postextubation.  Wean down oxygen as tolerated  Atrial fibrillation with rapid ventricular response Continue  amiodarone drip.  Can change to p.o. amiodarone when cleared by surgery Heparin in lieu of home Eliquis.  Can change to p.o Eliquis when cleared by surgery  Chronic HFpEF Telemetry monitoring Echo reviewed with 40-45%. Moderate LVH,   Diabetes mellitus CBG monitoring and sliding scale insulin  Baseline debility: Wheelchair-bound Full code per family  History of  stroke Can resume home therapies when able to take p.o.  Stable for transfer out of ICU and to hospitalist service  Best Practice (right click and "Reselect all SmartList Selections" daily)   Diet/type: NPO DVT prophylaxis: systemic heparin GI prophylaxis: PPI Lines: Central line Foley:  Yes, and it is still needed Code Status:  full code Last date of multidisciplinary goals of care discussion [02/26/23]  Critical care time:    Chilton Greathouse MD [facility] See Amion for pager  If no response to pager , please call [phone] until 7pm After 7:00 pm call Elink  [phone] 03/01/2023, 7:55 AM

## 2023-03-01 NOTE — Progress Notes (Signed)
SLP Cancellation Note  Patient Details Name: Stephen Flynn MRN: [id number] DOB: 1928-04-27   Cancelled treatment:       Reason Eval/Treat Not Completed: Medical issues which prohibited therapy;Other (comment) (increased WOB, not clearing secretions, decreased alertness. SLP will follow when ready.)  Angela Nevin, MA, CCC-SLP Speech Therapy

## 2023-03-01 NOTE — Progress Notes (Signed)
ANTICOAGULATION CONSULT NOTE- Follow Up  Pharmacy Consult for heparin Indication: atrial fibrillation  Allergies  Allergen Reactions   Penicillin G     Other reaction(s): Drowsy   Penicillins Hives    Did it involve swelling of the face/tongue/throat, SOB, or low BP? No Did it involve sudden or severe rash/hives, skin peeling, or any reaction on the inside of your mouth or nose? yes Did you need to seek medical attention at a hospital or doctor's office? yes When did it last happen?  child     If all above answers are "NO", may proceed with cephalosporin use.    Patient Measurements: Height: 5\' 9"  (175.3 cm) Weight: 99.7 kg (219 lb 12.8 oz) IBW/kg (Calculated) : 70.7 Heparin Dosing Weight: 89kg  Vital Signs: Temp: 97.5 F (36.4 C) (08/29 1934) Temp Source: Oral (08/29 1934) BP: 139/102 (08/29 2300) Pulse Rate: 85 (08/29 2300)  Labs: Recent Labs    02/27/23 0311 02/27/23 1257 02/28/23 0331 03/01/23 0322 03/01/23 1302 03/01/23 2042 03/01/23 2238  HGB 9.0*  --  8.5* 7.9* 8.1* 10.9*  --   HCT 27.1*  --  25.3* 23.9* 24.8* 32.0*  --   PLT 229  --  260 260 264  --   --   HEPARINUNFRC  --    < > 0.31 0.21* <0.10*  --  0.19*  CREATININE 1.37*  --  1.97* 1.56*  --   --   --    < > = values in this interval not displayed.    Estimated Creatinine Clearance: 34.4 mL/min (A) (by C-G formula based on SCr of 1.56 mg/dL (H)).   Medical History: Past Medical History:  Diagnosis Date   Anemia    Aortic insufficiency    Chest pain    H/O: GI bleed    Hyperlipidemia    Hypertension    Leg cramps    Stroke [facility])     Assessment: 58 YOM presenting with epigastric pain, on xarelto PTA for afib with last dose taken last week sometime per pt report (previously switched from Eliquis to Xarelto for GI issues, however pt reports having more issues and stopping Xarelto a few days prior to admission which he attributed to the drug)  8/29 PM: Heparin level returned at 0.19 on 1400  units/hr (subtherapeutic). Per RN, no issues with the heparin running continuously or issues with site of PIV. No signs/symptoms of bleeding. Hgb and plts stable.  Goal of Therapy:  Heparin level 0.3-0.7 units/ml Monitor platelets by anticoagulation protocol: Yes   Plan:  Increase heparin at 1600 u/h Follow up heparin level in 8h Daily heparin level, CBC, s/s bleeding  Arabella Merles, PharmD. Clinical Pharmacist 03/01/2023 11:39 PM

## 2023-03-01 NOTE — Progress Notes (Signed)
Pt with increased work of breathing and unable to clear secretions, Pt encouraged to cough and clear, but unable to at this time. Pt more lethargic then earlier and needing stimulation to respond. NT suctioning performed, MD, and RT called to bedside. Transfer to [facility] canceled at this time and RT getting an ABG. Will continue to monitor and assess.

## 2023-03-01 NOTE — Progress Notes (Signed)
   03/01/23 2053  BiPAP/CPAP/SIPAP  BiPAP/CPAP/SIPAP Pt Type Adult  BiPAP/CPAP/SIPAP V60  Mask Type Full face mask  Mask Size Medium  Set Rate 8 breaths/min  Respiratory Rate 20 breaths/min  IPAP 12 cmH20  EPAP 6 cmH2O  FiO2 (%) 30 %  Minute Ventilation 13.8  Leak 36  Peak Inspiratory Pressure (PIP) 13  Tidal Volume (Vt) 690  Patient Home Equipment No  Auto Titrate No  Press High Alarm 25 cmH2O  Press Low Alarm 5 cmH2O  Nasal massage performed Yes  CPAP/SIPAP surface wiped down Yes   Pt asleep and comfortable on bipap.  RT will continue to monitor

## 2023-03-01 NOTE — Progress Notes (Signed)
Patient ID: Stephen Flynn, male   DOB: [birth year], 89 y.o.   MRN: [id number] [facility] Surgery Progress Note  3 Days Post-Op  Subjective: CC-  No complaints. Nothing recorded for NG output and 1 stool documented, but nurse not sure if this is accurate. Patient states he is passing some flatus.  Objective: Vital signs in last 24 hours: Temp:  [97.7 F (36.5 C)-98 F (36.7 C)] 97.7 F (36.5 C) (08/29 0755) Pulse Rate:  [79-106] 81 (08/29 0600) Resp:  [16-25] 21 (08/29 0600) BP: (101-149)/(67-102) 118/77 (08/29 0600) SpO2:  [93 %-100 %] 96 % (08/29 0600) Weight:  [99.7 kg] 99.7 kg (08/29 0344)    Intake/Output from previous day: 08/28 0701 - 08/29 0700 In: 2410.1 [I.V.:2060.1; IV Piggyback:350] Out: 750 [Urine:750] Intake/Output this shift: No intake/output data recorded.  PE: Gen:  Alert but a little drowsy, NAD Card: A fib, regular rate Pulm:  rate and effort normal on 2L [place], some upper airway rhonchi Abd: Soft, mild distension, appropriately tender, few bowel sounds heard, vac to midline with good seal  Lab Results:  Recent Labs    02/28/23 0331 03/01/23 0322  WBC 6.9 9.4  HGB 8.5* 7.9*  HCT 25.3* 23.9*  PLT 260 260   BMET Recent Labs    02/28/23 0331 03/01/23 0322  NA 138 138  K 4.0 3.4*  CL 107 105  CO2 23 25  GLUCOSE 188* 145*  BUN 34* 39*  CREATININE 1.97* 1.56*  CALCIUM 8.5* 8.8*   PT/INR No results for input(s): "LABPROT", "INR" in the last 72 hours. CMP     Component Value Date/Time   NA 138 03/01/2023 0322   K 3.4 (L) 03/01/2023 0322   CL 105 03/01/2023 0322   CO2 25 03/01/2023 0322   GLUCOSE 145 (H) 03/01/2023 0322   BUN 39 (H) 03/01/2023 0322   CREATININE 1.56 (H) 03/01/2023 0322   CALCIUM 8.8 (L) 03/01/2023 0322   PROT 5.6 (L) 03/01/2023 0322   ALBUMIN 2.5 (L) 03/01/2023 0322   AST 25 03/01/2023 0322   ALT 18 03/01/2023 0322   ALKPHOS 34 (L) 03/01/2023 0322   BILITOT 0.4 03/01/2023 0322   GFRNONAA 41 (L) 03/01/2023 0322    GFRAA >60 05/16/2019 1110   Lipase     Component Value Date/Time   LIPASE 16 02/25/2023 1433       Studies/Results: DG Abd 1 View  Result Date: 02/28/2023 CLINICAL DATA:  NG tube placement EXAM: ABDOMEN - 1 VIEW COMPARISON:  CT abdomen/pelvis dated 02/25/2023 FINDINGS: Enteric tube terminates at the GE junction. Side port in the distal esophagus. Advancement approximately 10 cm is suggested. Mild patchy lingular opacity.  Possible small left pleural effusion. IMPRESSION: Enteric tube terminates at the GE junction. Advancement approximately 10 cm is suggested. Electronically Signed   By: Charline Bills M.D.   On: 02/28/2023 10:23    Anti-infectives: Anti-infectives (From admission, onward)    Start     Dose/Rate Route Frequency Ordered Stop   03/01/23 0300  ceFEPIme (MAXIPIME) 2 g in sodium chloride 0.9 % 100 mL IVPB        2 g 200 mL/hr over 30 Minutes Intravenous Every 24 hours 02/28/23 1141 03/02/23 2359   02/26/23 1445  piperacillin-tazobactam (ZOSYN) IVPB 3.375 g  Status:  Discontinued        3.375 g 100 mL/hr over 30 Minutes Intravenous Every 8 hours 02/26/23 1436 02/26/23 1445   02/26/23 1445  ceFEPIme (MAXIPIME) 2 g in sodium chloride 0.9 %  100 mL IVPB  Status:  Discontinued        2 g 200 mL/hr over 30 Minutes Intravenous Every 12 hours 02/26/23 1446 02/28/23 1141   02/26/23 1445  metroNIDAZOLE (FLAGYL) IVPB 500 mg        500 mg 100 mL/hr over 60 Minutes Intravenous Every 12 hours 02/26/23 1446 03/02/23 2359   02/26/23 1015  meropenem (MERREM) 1 g in sodium chloride 0.9 % 100 mL IVPB        1 g 200 mL/hr over 30 Minutes Intravenous  Once 02/26/23 0917 02/26/23 1743        Assessment/Plan Small bowel obstruction involving a medial adhesion across the distal jejunum  -POD#3 s/p Exploratory laparotomy small bowel resection and primary anastomosis 8/26 Dr. Luisa Hart - approximately 120 cm of small bowel remaining  - Unsure if I&Os accurate (no NG output, 1 stool).  Check film. Continue NPO/NGT to LIWS and for now. Continue TPN - abdominal wound vac MWF  - mobilize, continue PT/OT - rec CIR when he can tolerate   FEN - IVF, NPO/NGT to LIWS, TPN VTE - heparin gtt ID - maxipime/flagyl 8/26>> continue at least 5 days postop Foley - ok to d/c from our standpoint   AKI - Cr down 1.56 and UOP improving ABL anemia - Hgb 7.9, slow drift down, monitor A fib RVR - On amiodarone drip, heparin gtt Wheelchair/walker bound HTN H/O CVA HLD Diastolic CHF DM    LOS: 4 days    Franne Forts, PA-C [facility] 03/01/2023, 9:00 AM Please see Amion for pager number during day hours 7:00am-4:30pm

## 2023-03-01 NOTE — Progress Notes (Signed)
PT Cancellation Note  Patient Details Name: Stephen Flynn MRN: [id number] DOB: Nov 24, 1927   Cancelled Treatment:    Reason Eval/Treat Not Completed: (P) Medical issues which prohibited therapy, from chart review pt with noted increased WOB and now on bipap. Will check back as schedule allows and pt appropriate to continue with PT POC.  Lenora Boys. PTA Acute Rehabilitation Services Office: [phone]   Catalina Antigua 03/01/2023, 3:14 PM

## 2023-03-02 ENCOUNTER — Inpatient Hospital Stay (HOSPITAL_COMMUNITY): Payer: 59

## 2023-03-02 DIAGNOSIS — K56609 Unspecified intestinal obstruction, unspecified as to partial versus complete obstruction: Secondary | ICD-10-CM | POA: Diagnosis not present

## 2023-03-02 DIAGNOSIS — E119 Type 2 diabetes mellitus without complications: Secondary | ICD-10-CM | POA: Diagnosis not present

## 2023-03-02 DIAGNOSIS — I4819 Other persistent atrial fibrillation: Secondary | ICD-10-CM | POA: Diagnosis not present

## 2023-03-02 LAB — GLUCOSE, CAPILLARY
Glucose-Capillary: 135 mg/dL — ABNORMAL HIGH (ref 70–99)
Glucose-Capillary: 134 mg/dL — ABNORMAL HIGH (ref 70–99)
Glucose-Capillary: 143 mg/dL — ABNORMAL HIGH (ref 70–99)
Glucose-Capillary: 152 mg/dL — ABNORMAL HIGH (ref 70–99)
Glucose-Capillary: 128 mg/dL — ABNORMAL HIGH (ref 70–99)
Glucose-Capillary: 127 mg/dL — ABNORMAL HIGH (ref 70–99)

## 2023-03-02 LAB — POCT I-STAT 7, (LYTES, BLD GAS, ICA,H+H)
Acid-base deficit: 2 mmol/L (ref 0.0–2.0)
Bicarbonate: 24.3 mmol/L (ref 20.0–28.0)
Calcium, Ion: 1.37 mmol/L (ref 1.15–1.40)
HCT: 27 % — ABNORMAL LOW (ref 39.0–52.0)
Hemoglobin: 9.2 g/dL — ABNORMAL LOW (ref 13.0–17.0)
O2 Saturation: 94 %
Patient temperature: 96.7
Potassium: 4 mmol/L (ref 3.5–5.1)
Sodium: 140 mmol/L (ref 135–145)
TCO2: 26 mmol/L (ref 22–32)
pCO2 arterial: 48.1 mmHg — ABNORMAL HIGH (ref 32–48)
pH, Arterial: 7.307 — ABNORMAL LOW (ref 7.35–7.45)
pO2, Arterial: 73 mmHg — ABNORMAL LOW (ref 83–108)

## 2023-03-02 LAB — PHOSPHORUS

## 2023-03-02 LAB — CBC
HCT: 24.2 % — ABNORMAL LOW (ref 39.0–52.0)
Hemoglobin: 8.1 g/dL — ABNORMAL LOW (ref 13.0–17.0)
MCH: 31.5 pg (ref 26.0–34.0)
MCHC: 33.5 g/dL (ref 30.0–36.0)
MCV: 94.2 fL (ref 80.0–100.0)
Platelets: 246 10*3/uL (ref 150–400)
RBC: 2.57 MIL/uL — ABNORMAL LOW (ref 4.22–5.81)
RDW: 15.6 % — ABNORMAL HIGH (ref 11.5–15.5)
WBC: 7.8 10*3/uL (ref 4.0–10.5)
nRBC: 0 % (ref 0.0–0.2)

## 2023-03-02 LAB — POCT I-STAT EG7
Acid-base deficit: 3 mmol/L — ABNORMAL HIGH (ref 0.0–2.0)
Bicarbonate: 23.3 mmol/L (ref 20.0–28.0)
Calcium, Ion: 1.32 mmol/L (ref 1.15–1.40)
HCT: 26 % — ABNORMAL LOW (ref 39.0–52.0)
Hemoglobin: 8.8 g/dL — ABNORMAL LOW (ref 13.0–17.0)
O2 Saturation: 56 %
Patient temperature: 97.4
Potassium: 3.9 mmol/L (ref 3.5–5.1)
Sodium: 139 mmol/L (ref 135–145)
TCO2: 25 mmol/L (ref 22–32)
pCO2, Ven: 43.5 mmHg — ABNORMAL LOW (ref 44–60)
pH, Ven: 7.334 (ref 7.25–7.43)
pO2, Ven: 30 mmHg — CL (ref 32–45)

## 2023-03-02 LAB — BASIC METABOLIC PANEL: Anion gap: 8 (ref 5–15)

## 2023-03-02 LAB — RENAL FUNCTION PANEL
Albumin: 2.4 g/dL — ABNORMAL LOW (ref 3.5–5.0)
Anion gap: 6 (ref 5–15)
BUN: 53 mg/dL — ABNORMAL HIGH (ref 8–23)
CO2: 27 mmol/L (ref 22–32)
Calcium: 9 mg/dL (ref 8.9–10.3)
Chloride: 106 mmol/L (ref 98–111)
Creatinine, Ser: 1.44 mg/dL — ABNORMAL HIGH (ref 0.61–1.24)
GFR, Estimated: 45 mL/min — ABNORMAL LOW (ref >60)
Glucose, Bld: 152 mg/dL — ABNORMAL HIGH (ref 70–99)
Phosphorus: 2.6 mg/dL (ref 2.5–4.6)
Potassium: 4 mmol/L (ref 3.5–5.1)
Sodium: 139 mmol/L (ref 135–145)

## 2023-03-02 LAB — HEPARIN LEVEL (UNFRACTIONATED)
Heparin Unfractionated: 0.34 [IU]/mL (ref 0.30–0.70)
Heparin Unfractionated: >1.1 [IU]/mL — ABNORMAL HIGH (ref 0.30–0.70)
Heparin Unfractionated: 0.33 [IU]/mL (ref 0.30–0.70)

## 2023-03-02 LAB — MAGNESIUM: Magnesium: 2.3 mg/dL (ref 1.7–2.4)

## 2023-03-02 MED ORDER — PROSOURCE TF20 ENFIT COMPATIBL EN LIQD
60.0000 mL | Freq: Every day | ENTERAL | Status: DC
  Administered 2023-03-02 – 2023-03-06 (×5): 60 mL
  Filled 2023-03-02 (×5): qty 60

## 2023-03-02 MED ORDER — TRAVASOL 10 % IV SOLN
INTRAVENOUS | Status: AC
  Filled 2023-03-02: qty 1044

## 2023-03-02 MED ORDER — OSMOLITE 1.5 CAL PO LIQD
1000.0000 mL | ORAL | Status: DC
  Administered 2023-03-02 – 2023-03-04 (×4): 1000 mL
  Filled 2023-03-02 (×3): qty 1000

## 2023-03-02 MED ORDER — TRAVASOL 10 % IV SOLN
INTRAVENOUS | Status: DC
  Filled 2023-03-02: qty 1044

## 2023-03-02 MED ORDER — LACTATED RINGERS IV BOLUS
500.0000 mL | Freq: Once | INTRAVENOUS | Status: AC
  Administered 2023-03-02: 500 mL via INTRAVENOUS

## 2023-03-02 MED ORDER — SODIUM PHOSPHATES 45 MMOLE/15ML IV SOLN
30.0000 mmol | Freq: Once | INTRAVENOUS | Status: AC
  Administered 2023-03-02: 30 mmol via INTRAVENOUS
  Filled 2023-03-02: qty 10

## 2023-03-02 NOTE — Progress Notes (Signed)
eLink Physician-Brief Progress Note Patient Name: Stephen Flynn DOB: 03-08-1928 MRN: [id number]   Date of Service  03/02/2023  HPI/Events of Note  Placed on BiPAP earlier today with respiratory failure and hypercapnia.    ABG with hypercapnia, pH 7.28  eICU Interventions  Repeat VBG in the morning     Intervention Category Intermediate Interventions: Respiratory distress - evaluation and management  Neyah Ellerman 03/02/2023, 2:41 AM

## 2023-03-02 NOTE — Progress Notes (Signed)
PT Cancellation Note  Patient Details Name: Stephen Flynn MRN: [id number] DOB: 12/12/1927   Cancelled Treatment:    Reason Eval/Treat Not Completed: (P) Medical issues which prohibited therapy, pt with continued lethargy and increased WOB, RN asking therapies to hold this date. Will check back as schedule allows to continue with PT POC.  Lenora Boys. PTA Acute Rehabilitation Services Office: [phone]    Catalina Antigua 03/02/2023, 12:25 PM

## 2023-03-02 NOTE — Progress Notes (Signed)
NAMEJaton Flynn, MRN:  [id number], DOB:  [birth year], LOS: 5 ADMISSION DATE:  02/25/2023, CONSULTATION DATE: 8/26 REFERRING MD: Dr. Randol Kern, CHIEF COMPLAINT: Hypotension  History of Present Illness:  Patient is encephalopathic and/or intubated; therefore, history has been obtained from chart review.  89 year old male with past medical history as below, which is significant for atrial fibrillation on Eliquis, wheelchair-bound, HFpEF, and stroke who presented to [facility] emergency department on 8/25 with complaints of acute onset abdominal pain with no bowel movement for 3 days prior to presentation.  Also sounds like he had not been consistently taking his Eliquis.  Upon arrival to the emergency department the patient was noted to be in atrial fibrillation with RVR to the 120s.  Started on diltiazem infusion and placed on heparin drip.  CT of the abdomen and pelvis demonstrated small bowel obstruction and moderate ascites.  General surgery was consulted and recommended conservative management with NG tube decompression.  He was admitted to the hospitalist service.  In the a.m. hours of 8/26 the patient developed waning mental status and became nonverbal.  He also developed hypotension with systolic pressures in the 70s and desaturation to the 80s.  His abdomen became more firm and rigid and exquisitely tender to pain.  The surgical team was at bedside and determined the patient would likely require urgent surgery.  PCCM was consulted for hypotension and hypoxia.  Pertinent  Medical History   has a past medical history of Anemia, Aortic insufficiency, Chest pain, H/O: GI bleed, Hyperlipidemia, Hypertension, Leg cramps, and Stroke (HCC).   Significant Hospital Events: Including procedures, antibiotic start and stop dates in addition to other pertinent events   8/25 admit for SBO 8/26 acute abdomen, tx to ICU for pressors. PICC line placed. Underwent Ex lap and small bowel resection 8/27  Extubated 8/27  Interim History / Subjective:  Became hypoxic and hypercapnic yesterday evening before transfer out of ICU, required BiPAP overnight Continue to have copious amount of thick oral secretions Remain afebrile  Objective   Blood pressure (!) 148/87, pulse 79, temperature (!) 96.7 F (35.9 C), temperature source Axillary, resp. rate 19, height 5\' 9"  (1.753 m), weight 102.4 kg, SpO2 99%.    FiO2 (%):  [30 %] 30 % Pressure Support:  [6 cmH20] 6 cmH20   Intake/Output Summary (Last 24 hours) at 03/02/2023 0927 Last data filed at 03/02/2023 0700 Gross per 24 hour  Intake 3196.15 ml  Output 700 ml  Net 2496.15 ml   Filed Weights   02/28/23 0415 03/01/23 0344 03/02/23 0500  Weight: 99.7 kg 99.7 kg 102.4 kg    Examination: General: Chronically ill-appearing elderly male, lying on the bed HEENT: [place]/AT, eyes anicteric.  moist mucus membranes Neuro: Awake, severely dysarthric, following simple commands Chest: Coarse breath sounds, no wheezes or rhonchi Heart: Regular rate and rhythm, no murmurs or gallops Abdomen: Soft, wound VAC in place, nondistended, bowel sounds present Skin: No rash   Lab/imaging reviewed Significant for potassium 3.9, BUN/creatinine 39/1.56, AST 25, ALT 18 Hemoglobin 8.1, platelets 246  Resolved Hospital Problem list   Septic shock  Assessment & Plan:  Small bowel obstruction s/p ex lap and small bowel resection.  Acute abdomen Patient pulled out his NG tube Appreciate general surgery follow-up Recommend starting trickle tube feed Continue TPN Continue wound VAC Wound care is following  Septic shock resolved Continue antibiotic to complete 5 days postop  Acute respiratory failure with hypoxia and hypercapnia secondary to aspiration Currently  on room air ABG was drawn which showed CO2 retention with pH 7.3 Closely watch, he may require to go back on BiPAP but unfortunately has a ton of oral secretions which make things difficult and  increase the risk of aspiration Closely monitor in ICU  Paroxysmal atrial fibrillation with rapid ventricular response Converted back to sinus rhythm Continue amiodarone infusion until cortrak is placed then will switch to Amio p.o. Continue IV heparin infusion for stroke prophylaxis   Chronic HFrEF Action fraction is 40 to 45% Moderate LVH Monitor intake and output He is euvolemic  Diabetes mellitus, controlled Hemoglobin A1c 6.5 CBG monitoring and sliding scale insulin with CBG goal 140-180  Baseline debility: Wheelchair-bound Full code per family  History of stroke Can resume home therapies when able to take p.o.  Best Practice (right click and "Reselect all SmartList Selections" daily)   Diet/type: NPO start trickle tube feeds.  On TPN DVT prophylaxis: systemic heparin GI prophylaxis: PPI Lines: Central line Foley:  Yes, and it is still needed Code Status:  full code Last date of multidisciplinary goals of care discussion [02/26/23]  The patient is critically ill due to small bowel obstruction s/p resection, acute respiratory failure with hypoxia and hypercapnia, aspiration pneumonia critical care was necessary to treat or prevent imminent or life-threatening deterioration.  Critical care was time spent personally by me on the following activities: development of treatment plan with patient and/or surrogate as well as nursing, discussions with consultants, evaluation of patient's response to treatment, examination of patient, obtaining history from patient or surrogate, ordering and performing treatments and interventions, ordering and review of laboratory studies, ordering and review of radiographic studies, pulse oximetry, re-evaluation of patient's condition and participation in multidisciplinary rounds.   During this encounter critical care time was devoted to patient care services described in this note for 35 minutes.     Cheri Fowler, MD [facility] See Amion for pager If no response to pager, please call [phone] until 7pm After 7pm, Please call E-link [phone]

## 2023-03-02 NOTE — Progress Notes (Signed)
Minimal urine output, MD notified and bolus given

## 2023-03-02 NOTE — Procedures (Signed)
Cortrak  Person Inserting Tube:  Kendell Bane C, RD Tube Type:  Cortrak - 43 inches Tube Size:  10 Tube Location:  Left nare Secured by: Bridle Technique Used to Measure Tube Placement:  Marking at nare/corner of mouth Cortrak Secured At:  92 cm   Cortrak Tube Team Note:  Consult received to place a Cortrak feeding tube.   X-ray is required, abdominal x-ray has been ordered by the Cortrak team. Please confirm tube placement before using the Cortrak tube.   If the tube becomes dislodged please keep the tube and contact the Cortrak team at [email or web address] for replacement.  If after hours and replacement cannot be delayed, place a NG tube and confirm placement with an abdominal x-ray.    Cammy Copa., RD, LDN, CNSC See AMiON for contact information

## 2023-03-02 NOTE — Progress Notes (Signed)
PHARMACY - TOTAL PARENTERAL NUTRITION CONSULT NOTE   Indication: Small bowel obstruction and Short bowel syndrome  Patient Measurements: Height: 5\' 9"  (175.3 cm) Weight: 102.4 kg (225 lb 12 oz) IBW/kg (Calculated) : 70.7   Body mass index is 33.34 kg/m. Usual Weight: 104 kg at admission  Assessment: Stephen Flynn admitted 8/25 for abdominal pain after eating breakfast, found with small bowel obstruction. Exlap performed 8/26, patient in continuity however large amount of bowel resected with ~120 cm remaining. Pharmacy consulted to initiate TPN.  Glucose / Insulin: BG 134 - 160, A1c 6.5; 14 SSI over 24h Electrolytes: Na 140, K 4.0, Cl 106, CO2 27, 8/28 iCa 1.37, CoCa 10.2 Mg 2.3, P 2.6 Renal: Scr 1.44 (b/l ~0.9), BUN 53, 0.3 ml/kg/h UOP Hepatic: 8/26 AST/ALT wnl, Tbili 0.6, Alkphos 31, Albumin 2.7 Intake / Output; MIVF: Net +9.3L GI Imaging: 8/29 Abd [place]: mild stool, no abnormal dilatation GI Surgeries / Procedures:  No procedures since initiation of TPN  Central access: PICC triple lumen TPN start date: 8/27  Nutritional Goals: Goal TPN rate is 75 mL/hr (provides 104.4 g of protein and 1832 kcals per day)  RD Assessment: Estimated Needs Total Energy Estimated Needs: 1800-2000 kcal/d Total Protein Estimated Needs: 90-105g/d Total Fluid Estimated Needs: 1.8L/d  Current Nutrition:  NPO and TPN  Plan:  Continue TPN at goal rate of 75 mL/hr at 1800 (provides 104.4 g protein, 1814.4 kcal), which will be 100% of goal needs Electrolytes in TPN: Na 50 mEq/L, K 40 mEq/L, remove Ca 8/30, Mg 5 mEq/L, and Phos 12 mmol/L. Cl:Ac 1:2 Add standard MVI and trace elements to TPN Continue Moderate q4h SSI and adjust as needed  Monitor TPN labs on Mon/Thurs, PRN  Supplement thiamine 100 mg IV x 5 days outside of TPN Supplement sodium phos 30 mmol IV x1 outside of TPN  Rutherford Nail, PharmD PGY2 Critical Care Pharmacy Resident 03/02/2023,9:13 AM

## 2023-03-02 NOTE — Progress Notes (Signed)
ANTICOAGULATION CONSULT NOTE- Follow Up  Pharmacy Consult for heparin Indication: atrial fibrillation  Allergies  Allergen Reactions   Penicillin G     Other reaction(s): Drowsy   Penicillins Hives    Did it involve swelling of the face/tongue/throat, SOB, or low BP? No Did it involve sudden or severe rash/hives, skin peeling, or any reaction on the inside of your mouth or nose? yes Did you need to seek medical attention at a hospital or doctor's office? yes When did it last happen?  child     If all above answers are "NO", may proceed with cephalosporin use.    Patient Measurements: Height: 5\' 9"  (175.3 cm) Weight: 102.4 kg (225 lb 12 oz) IBW/kg (Calculated) : 70.7 Heparin Dosing Weight: 89kg  Vital Signs: Temp: 97.1 F (36.2 C) (08/30 1935) Temp Source: Axillary (08/30 1935) BP: 116/80 (08/30 1800) Pulse Rate: 76 (08/30 1800)  Labs: Recent Labs    03/01/23 0322 03/01/23 1302 03/01/23 2042 03/02/23 0446 03/02/23 0538 03/02/23 0600 03/02/23 0800 03/02/23 0840 03/02/23 0920 03/02/23 1800 03/02/23 2018  HGB 7.9* 8.1*   < >  --  8.8* 8.1*  --   --  9.2*  --   --   HCT 23.9* 24.8*   < >  --  26.0* 24.2*  --   --  27.0*  --   --   PLT 260 264  --   --   --  246  --   --   --   --   --   HEPARINUNFRC 0.21* <0.10*   < >  --   --   --  0.34  --   --  >1.10* 0.33  CREATININE 1.56*  --   --  CONTAMINATED  --   --   --  1.44*  --   --   --    < > = values in this interval not displayed.    Estimated Creatinine Clearance: 37.8 mL/min (A) (by C-G formula based on SCr of 1.44 mg/dL (H)).   Medical History: Past Medical History:  Diagnosis Date   Anemia    Aortic insufficiency    Chest pain    H/O: GI bleed    Hyperlipidemia    Hypertension    Leg cramps    Stroke [facility])     Assessment: 46 YOM presenting with epigastric pain, on xarelto PTA for afib with last dose taken last week sometime per pt report (previously switched from Eliquis to Xarelto for GI issues,  however pt reports having more issues and stopping Xarelto a few days prior to admission which he attributed to the drug)  Heparin level >1.1 is supratherapeutic on 1600units/hr but likely erroneous level. Repeat level is at 0.33 and is therapeutic. Confirmed heparin is running at 1600 unit/hr.    Goal of Therapy:  Heparin level 0.3-0.7 units/ml Monitor platelets by anticoagulation protocol: Yes   Plan:  Continue heparin at 1600 u/h  Daily heparin level, CBC, s/s bleeding Patient pulled out NG tube, will f/u oral therapy once Cortrak placed  Alphia Moh, PharmD, BCPS, [facility] Clinical Pharmacist  Please check AMION for all [facility] Pharmacy phone numbers After 10:00 PM, call Main Pharmacy [phone]

## 2023-03-02 NOTE — Progress Notes (Addendum)
Nutrition Follow-up  DOCUMENTATION CODES:   Non-severe (moderate) malnutrition in context of chronic illness  INTERVENTION:  TPN, management per pharmacy Due to the length of remaining bowel pt is at high risk for short bowel syndrome and will likely require TPN for the remainder of his life. Initiate tube feeding via cortrak tube: Osmolite 1.5 at 50 ml/h (1200 ml per day) Start at trickles and hold rate. If ok to advance, increase by 10mL q12h to goal. Prosource TF20 60 ml 1x/d Provides 1880 kcal, 95 gm protein, 914 ml free water daily  NUTRITION DIAGNOSIS:   Moderate Malnutrition related to chronic illness as evidenced by mild fat depletion, severe muscle depletion. - remains applicable  GOAL:   Patient will meet greater than or equal to 90% of their needs - progressing, being met with TPN at goal  MONITOR:   I & O's, Vent status, Labs, Weight trends  REASON FOR ASSESSMENT:   Consult Enteral/tube feeding initiation and management (trickles)  ASSESSMENT:   Pt with hx of HTN, HLD, CVA, CHF, DM type 2, atrial fibrillation, and AAA presented to ED with abdominal pain. Imaging showed a SBO. Initially treated conservatively but ultimately developed hypotension and was transferred to ICU and then taken to OR for repair.  8/25 - admitted with SBO 8/26 - Op, exploratory laparotomy with bowel resection, Small bowel was resected from the distal jejunum to the distal ileum. ~120cm of remaining bowel. PICC placed 8/27 - TPN initiated, extubated 8/28 - wound vac placed 8/30 - cortrak placed (extreme distal stomach or proximal duodenum)   Pt resting in bed at the time of assessment. Cortrak just placed. No family is present at the bedside. RN reports pt still intermittently confused, more so than yesterday. Per surgery ok to trial trickle TF  TPN infusing at goal rate of 64mL/h (provides 104.4 g of protein and 1832 kcals per day). Pt has <200cm bowel remaining which puts him at risk  for short gut syndrome. Pt is at high risk for requiring TPN for the remainder of his life. Will have to carefully monitor when attempting to cut back on TPN infusion.    Intake/Output Summary (Last 24 hours) at 03/02/2023 1446 Last data filed at 03/02/2023 1400 Gross per 24 hour  Intake 2849.65 ml  Output 850 ml  Net 1999.65 ml  Net IO Since Admission: 9,597.99 mL [03/02/23 1446]  Nutritionally Relevant Medications: Scheduled Meds:  insulin aspart  0-15 Units Subcutaneous Q4H   pantoprazole   40 mg Intravenous Q24H   thiamine  100 mg Intravenous Daily   Continuous Infusions:  metronidazole Stopped (03/02/23 0526)   sodium phosphate 30 mmol in dextrose 5 % 250 mL infusion Stopped (03/02/23 1140)   TPN ADULT (ION) 75 mL/hr at 03/02/23 1400   Labs Reviewed: Creatinine 1.37 CBG ranges from 111-139 mg/dL over the last 24 hours HgbA1c 6.5%  NUTRITION - FOCUSED PHYSICAL EXAM: Flowsheet Row Most Recent Value  Orbital Region Mild depletion  Upper Arm Region Mild depletion  Thoracic and Lumbar Region No depletion  Buccal Region Unable to assess  Temple Region Mild depletion  Clavicle Bone Region Severe depletion  Clavicle and Acromion Bone Region Severe depletion  Scapular Bone Region Mild depletion  Dorsal Hand Unable to assess  Patellar Region Unable to assess  [edema]  Anterior Thigh Region Unable to assess  [edema]  Posterior Calf Region Unable to assess  [edema]  Edema (RD Assessment) Moderate  [BLE]  Hair Reviewed  Eyes Reviewed  Mouth Reviewed  [  missing teeth]  Skin Reviewed  Nails Reviewed   Diet Order:   Diet Order             Diet NPO time specified Except for: Ice Chips  Diet effective now                   EDUCATION NEEDS:  Not appropriate for education at this time  Skin:  Skin Assessment: Reviewed RN Assessment (surgical incision, midline)  Last BM:  8/30  Height:  Ht Readings from Last 1 Encounters:  02/26/23 5\' 9"  (1.753 m)    Weight:  Wt  Readings from Last 1 Encounters:  03/02/23 102.4 kg    Ideal Body Weight:  72.7 kg  BMI:  Body mass index is 33.34 kg/m.  Estimated Nutritional Needs:  Kcal:  1800-2000 kcal/d Protein:  90-105g/d Fluid:  1.8L/d    Greig Castilla, RD, LDN Clinical Dietitian RD pager # available in AMION  After hours/weekend pager # available in [facility]

## 2023-03-02 NOTE — Consult Note (Addendum)
WOC Nurse wound follow up Wound type: full thickness midline, surgical  Measurement: see 02/28/2023 note Wound bed:75% pink moist 25% scattered yellow fibrin/subcutaneous tissue  Drainage (amount, consistency, odor) minimal serosanguinous in cannister  Periwound: intact, mild dip at umbilicus  Dressing procedure/placement/frequency: Removed old NPWT dressing Cleansed wound with normal saline Filled upper portion of wound where suture is visible with small piece of Mepitel, 1 piece of black foam; half of a barrier ring flattened and placed at umbilicus to enhance seal  Sealed NPWT dressing at HG Patient received IV pain medication per bedside nurse prior to dressing change Patient tolerated procedure well  WOC nurse will continue to provide NPWT dressing changes M-W-F due to the complexity of the dressing change; next change Monday 03/05/2023.   Ordered 2 medium black foam kits for room at this visit.    Thank you,    Priscella Mann MSN, RN-BC, [employer] [phone]

## 2023-03-02 NOTE — Progress Notes (Signed)
[facility] Progress Note  4 Days Post-Op  Subjective: CC-  On Bipap BM yesterday. Abdominal xray yesterday normal bowel gas pattern  Objective: Vital signs in last 24 hours: Temp:  [96.7 F (35.9 C)-97.5 F (36.4 C)] 96.7 F (35.9 C) (08/30 0753) Pulse Rate:  [74-92] 79 (08/30 0600) Resp:  [16-26] 19 (08/30 0600) BP: (110-162)/(73-102) 132/86 (08/30 0600) SpO2:  [87 %-100 %] 98 % (08/30 0600) FiO2 (%):  [30 %] 30 % (08/29 2053) Weight:  [102.4 kg] 102.4 kg (08/30 0500) Last BM Date : 02/28/23  Intake/Output from previous day: 08/29 0701 - 08/30 0700 In: 3196.2 [I.V.:2546.2; IV Piggyback:650] Out: 700 [Urine:700] Intake/Output this shift: No intake/output data recorded.  PE: Gen:  Alert but drowsy, ill appearing Pulm:  Bipap Abd: soft, mild distension, nontender, vac to midline with good seal and trace serosanguinous fluid in canister,  Lab Results:  Recent Labs    03/01/23 1302 03/01/23 2042 03/02/23 0538 03/02/23 0600  WBC 9.5  --   --  7.8  HGB 8.1*   < > 8.8* 8.1*  HCT 24.8*   < > 26.0* 24.2*  PLT 264  --   --  246   < > = values in this interval not displayed.   BMET Recent Labs    03/01/23 0322 03/01/23 2042 03/02/23 0446 03/02/23 0538  NA 138   < > CONTAMINATED 139  K 3.4*   < > CONTAMINATED 3.9  CL 105  --  CONTAMINATED  --   CO2 25  --  CONTAMINATED  --   GLUCOSE 145*  --  CONTAMINATED  --   BUN 39*  --  CONTAMINATED  --   CREATININE 1.56*  --  CONTAMINATED  --   CALCIUM 8.8*  --  CONTAMINATED  --    < > = values in this interval not displayed.   PT/INR No results for input(s): "LABPROT", "INR" in the last 72 hours. CMP     Component Value Date/Time   NA 139 03/02/2023 0538   K 3.9 03/02/2023 0538   CL CONTAMINATED 03/02/2023 0446   CO2 CONTAMINATED 03/02/2023 0446   GLUCOSE CONTAMINATED 03/02/2023 0446   BUN CONTAMINATED 03/02/2023 0446   CREATININE CONTAMINATED 03/02/2023 0446   CALCIUM CONTAMINATED 03/02/2023 0446    PROT 5.6 (L) 03/01/2023 0322   ALBUMIN 2.5 (L) 03/01/2023 0322   AST 25 03/01/2023 0322   ALT 18 03/01/2023 0322   ALKPHOS 34 (L) 03/01/2023 0322   BILITOT 0.4 03/01/2023 0322   GFRNONAA NOT CALCULATED 03/02/2023 0446   GFRAA >60 05/16/2019 1110   Lipase     Component Value Date/Time   LIPASE 16 02/25/2023 1433       Studies/Results: DG Abd Portable 1V  Result Date: 03/01/2023 CLINICAL DATA:  No bowel movement since surgery 2 days ago.  Ileus. EXAM: PORTABLE ABDOMEN - 1 VIEW COMPARISON:  February 28, 2023. FINDINGS: The bowel gas pattern is normal. Mild amount of stool is noted in transverse colon. Distal tip of nasogastric tube is seen in proximal stomach. No radio-opaque calculi or other significant radiographic abnormality are seen. IMPRESSION: No abnormal bowel dilatation. Electronically Signed   By: Lupita Raider M.D.   On: 03/01/2023 10:11   DG Abd 1 View  Result Date: 02/28/2023 CLINICAL DATA:  NG tube placement EXAM: ABDOMEN - 1 VIEW COMPARISON:  CT abdomen/pelvis dated 02/25/2023 FINDINGS: Enteric tube terminates at the GE junction. Side port in the distal esophagus. Advancement approximately 10 cm  is suggested. Mild patchy lingular opacity.  Possible small left pleural effusion. IMPRESSION: Enteric tube terminates at the GE junction. Advancement approximately 10 cm is suggested. Electronically Signed   By: Charline Bills M.D.   On: 02/28/2023 10:23    Anti-infectives: Anti-infectives (From admission, onward)    Start     Dose/Rate Route Frequency Ordered Stop   03/01/23 1500  ceFEPIme (MAXIPIME) 2 g in sodium chloride 0.9 % 100 mL IVPB        2 g 200 mL/hr over 30 Minutes Intravenous Every 12 hours 03/01/23 1124 03/02/23 0356   03/01/23 0300  ceFEPIme (MAXIPIME) 2 g in sodium chloride 0.9 % 100 mL IVPB  Status:  Discontinued        2 g 200 mL/hr over 30 Minutes Intravenous Every 24 hours 02/28/23 1141 03/01/23 1124   02/26/23 1445  piperacillin-tazobactam (ZOSYN)  IVPB 3.375 g  Status:  Discontinued        3.375 g 100 mL/hr over 30 Minutes Intravenous Every 8 hours 02/26/23 1436 02/26/23 1445   02/26/23 1445  ceFEPIme (MAXIPIME) 2 g in sodium chloride 0.9 % 100 mL IVPB  Status:  Discontinued        2 g 200 mL/hr over 30 Minutes Intravenous Every 12 hours 02/26/23 1446 02/28/23 1141   02/26/23 1445  metroNIDAZOLE (FLAGYL) IVPB 500 mg        500 mg 100 mL/hr over 60 Minutes Intravenous Every 12 hours 02/26/23 1446 03/02/23 2359   02/26/23 1015  meropenem (MERREM) 1 g in sodium chloride 0.9 % 100 mL IVPB        1 g 200 mL/hr over 30 Minutes Intravenous  Once 02/26/23 0917 02/26/23 1743        Assessment/Plan Small bowel obstruction involving a medial adhesion across the distal jejunum  -POD#4 s/p Exploratory laparotomy small bowel resection and primary anastomosis 8/26 Dr. Luisa Hart - approximately 120 cm of small bowel remaining  - Having some bowel function and xray shows normal bowel gas pattern. He is on Bipap, not managing secretions and not ready for swallow eval. Discussed with primary team, ok to start trickle tube feedings from our standpoint. Continue TPN - abdominal wound vac MWF  - mobilize as able, PT/OT    FEN - IVF, NPO, NG clamped, TPN VTE - heparin gtt ID - maxipime/flagyl 8/26>> continue at least 5 days postop Foley - out 8/29 and voiding   AKI ABL anemia  A fib RVR - On amiodarone drip, heparin gtt Wheelchair/walker bound HTN H/O CVA HLD Diastolic CHF DM    LOS: 5 days    Franne Forts, [facility] 03/02/2023, 8:51 AM Please see Amion for pager number during day hours 7:00am-4:30pm

## 2023-03-02 NOTE — Progress Notes (Signed)
RN placed on BIPAP per CCM, mental status.

## 2023-03-02 NOTE — Progress Notes (Signed)
ANTICOAGULATION CONSULT NOTE- Follow Up  Pharmacy Consult for heparin Indication: atrial fibrillation  Allergies  Allergen Reactions   Penicillin G     Other reaction(s): Drowsy   Penicillins Hives    Did it involve swelling of the face/tongue/throat, SOB, or low BP? No Did it involve sudden or severe rash/hives, skin peeling, or any reaction on the inside of your mouth or nose? yes Did you need to seek medical attention at a hospital or doctor's office? yes When did it last happen?  child     If all above answers are "NO", may proceed with cephalosporin use.    Patient Measurements: Height: 5\' 9"  (175.3 cm) Weight: 102.4 kg (225 lb 12 oz) IBW/kg (Calculated) : 70.7 Heparin Dosing Weight: 89kg  Vital Signs: Temp: 96.7 F (35.9 C) (08/30 0753) Temp Source: Axillary (08/30 0753) BP: 148/87 (08/30 0835) Pulse Rate: 79 (08/30 0600)  Labs: Recent Labs    02/28/23 0331 03/01/23 0322 03/01/23 1302 03/01/23 2042 03/01/23 2238 03/02/23 0446 03/02/23 0538 03/02/23 0600 03/02/23 0800 03/02/23 0920  HGB 8.5* 7.9* 8.1*   < >  --   --  8.8* 8.1*  --  9.2*  HCT 25.3* 23.9* 24.8*   < >  --   --  26.0* 24.2*  --  27.0*  PLT 260 260 264  --   --   --   --  246  --   --   HEPARINUNFRC 0.31 0.21* <0.10*  --  0.19*  --   --   --  0.34  --   CREATININE 1.97* 1.56*  --   --   --  CONTAMINATED  --   --   --   --    < > = values in this interval not displayed.    CrCl cannot be calculated (This lab value cannot be used to calculate CrCl because it is not a number: CONTAMINATED).   Medical History: Past Medical History:  Diagnosis Date   Anemia    Aortic insufficiency    Chest pain    H/O: GI bleed    Hyperlipidemia    Hypertension    Leg cramps    Stroke [facility])     Assessment: 28 YOM presenting with epigastric pain, on xarelto PTA for afib with last dose taken last week sometime per pt report (previously switched from Eliquis to Xarelto for GI issues, however pt reports  having more issues and stopping Xarelto a few days prior to admission which he attributed to the drug)  8/30 0800: Heparin level returned at 0.34 on 1600 u/h, therapeutic, however patient pulled out line.  Goal of Therapy:  Heparin level 0.3-0.7 units/ml Monitor platelets by anticoagulation protocol: Yes   Plan:  Continue heparin at 1600 u/h Follow up heparin level in 8h from line being reattached, at 1800 Daily heparin level, CBC, s/s bleeding Patient pulled out NG tube, will f/u oral therapy once Cortrak placed  Rutherford Nail, PharmD PGY2 Critical Care Pharmacy Resident 03/02/2023 9:50 AM

## 2023-03-02 NOTE — Progress Notes (Signed)
SLP Cancellation Note  Patient Details Name: Stephen Flynn MRN: [id number] DOB: May 26, 1928   Cancelled treatment:       Reason Eval/Treat Not Completed: Medical issues which prohibited therapy.  Spoke with RN, patient not managing secretions, concern for aspiration of secretions while on Bipap. Plans to attempt to NT suction patient and obtain ABG. Will f/u next date.   Ferdinand Lango MA, CCC-SLP   Jaedynn Bohlken Meryl 03/02/2023, 8:35 AM

## 2023-03-02 NOTE — Progress Notes (Signed)
Heart Failure Navigator Progress Note  Assessed for Heart & Vascular TOC clinic readiness.  Patient does not meet criteria due to multiple co morbidities, lethargy  Navigator available for reassessment of patient.   Meredith Staggers, RN, BSN, [facility] Heart Failure Navigator Heart & Vascular Care Navigation Team

## 2023-03-02 NOTE — Progress Notes (Signed)
OT Cancellation Note  Patient Details Name: Stephen Flynn MRN: [id number] DOB: 04-04-1928   Cancelled Treatment:    Reason Eval/Treat Not Completed: Medical issues which prohibited therapy. On BiPAP and newly on heparin for a-fib. RN requesting hold. Domenica Fail, OTR/L [facility] Acute Rehabilitation Office: [phone]    Myrla Halsted 03/02/2023, 9:54 AM

## 2023-03-03 ENCOUNTER — Inpatient Hospital Stay (HOSPITAL_COMMUNITY): Payer: 59

## 2023-03-03 DIAGNOSIS — R6521 Severe sepsis with septic shock: Secondary | ICD-10-CM

## 2023-03-03 DIAGNOSIS — A419 Sepsis, unspecified organism: Secondary | ICD-10-CM | POA: Diagnosis not present

## 2023-03-03 DIAGNOSIS — I4819 Other persistent atrial fibrillation: Secondary | ICD-10-CM | POA: Diagnosis not present

## 2023-03-03 DIAGNOSIS — E440 Moderate protein-calorie malnutrition: Secondary | ICD-10-CM

## 2023-03-03 DIAGNOSIS — K56609 Unspecified intestinal obstruction, unspecified as to partial versus complete obstruction: Secondary | ICD-10-CM | POA: Diagnosis not present

## 2023-03-03 LAB — CBC
HCT: 23.1 % — ABNORMAL LOW (ref 39.0–52.0)
Hemoglobin: 7.6 g/dL — ABNORMAL LOW (ref 13.0–17.0)
MCH: 30.4 pg (ref 26.0–34.0)
MCHC: 32.9 g/dL (ref 30.0–36.0)
MCV: 92.4 fL (ref 80.0–100.0)
Platelets: 233 10*3/uL (ref 150–400)
RBC: 2.5 MIL/uL — ABNORMAL LOW (ref 4.22–5.81)
RDW: 15.8 % — ABNORMAL HIGH (ref 11.5–15.5)
WBC: 5.8 10*3/uL (ref 4.0–10.5)
nRBC: 0.3 % — ABNORMAL HIGH (ref 0.0–0.2)

## 2023-03-03 LAB — BASIC METABOLIC PANEL
Anion gap: 7 (ref 5–15)
BUN: 61 mg/dL — ABNORMAL HIGH (ref 8–23)
CO2: 23 mmol/L (ref 22–32)
Calcium: 8.8 mg/dL — ABNORMAL LOW (ref 8.9–10.3)
Chloride: 107 mmol/L (ref 98–111)
Creatinine, Ser: 1.4 mg/dL — ABNORMAL HIGH (ref 0.61–1.24)
GFR, Estimated: 47 mL/min — ABNORMAL LOW (ref >60)
Glucose, Bld: 175 mg/dL — ABNORMAL HIGH (ref 70–99)
Potassium: 4 mmol/L (ref 3.5–5.1)
Sodium: 137 mmol/L (ref 135–145)

## 2023-03-03 LAB — GLUCOSE, CAPILLARY
Glucose-Capillary: 121 mg/dL — ABNORMAL HIGH (ref 70–99)
Glucose-Capillary: 150 mg/dL — ABNORMAL HIGH (ref 70–99)
Glucose-Capillary: 148 mg/dL — ABNORMAL HIGH (ref 70–99)
Glucose-Capillary: 147 mg/dL — ABNORMAL HIGH (ref 70–99)
Glucose-Capillary: 136 mg/dL — ABNORMAL HIGH (ref 70–99)
Glucose-Capillary: 141 mg/dL — ABNORMAL HIGH (ref 70–99)

## 2023-03-03 LAB — PHOSPHORUS: Phosphorus: 3.6 mg/dL (ref 2.5–4.6)

## 2023-03-03 LAB — HEPARIN LEVEL (UNFRACTIONATED): Heparin Unfractionated: 0.35 [IU]/mL (ref 0.30–0.70)

## 2023-03-03 MED ORDER — APIXABAN 5 MG PO TABS
5.0000 mg | ORAL_TABLET | Freq: Two times a day (BID) | ORAL | Status: DC
  Administered 2023-03-03 – 2023-03-06 (×7): 5 mg
  Filled 2023-03-03 (×7): qty 1

## 2023-03-03 MED ORDER — THIAMINE MONONITRATE 100 MG PO TABS
100.0000 mg | ORAL_TABLET | Freq: Every day | ORAL | Status: AC
  Administered 2023-03-03: 100 mg
  Filled 2023-03-03: qty 1

## 2023-03-03 MED ORDER — LACTATED RINGERS IV BOLUS
500.0000 mL | Freq: Once | INTRAVENOUS | Status: AC
  Administered 2023-03-03: 500 mL via INTRAVENOUS

## 2023-03-03 MED ORDER — TRAVASOL 10 % IV SOLN
INTRAVENOUS | Status: AC
  Filled 2023-03-03: qty 1044

## 2023-03-03 MED ORDER — AMIODARONE HCL 200 MG PO TABS
200.0000 mg | ORAL_TABLET | Freq: Two times a day (BID) | ORAL | Status: DC
  Administered 2023-03-03 – 2023-03-05 (×5): 200 mg via ORAL
  Filled 2023-03-03 (×6): qty 1

## 2023-03-03 MED ORDER — IPRATROPIUM-ALBUTEROL 0.5-2.5 (3) MG/3ML IN SOLN
3.0000 mL | Freq: Three times a day (TID) | RESPIRATORY_TRACT | Status: DC
  Administered 2023-03-03 (×2): 3 mL via RESPIRATORY_TRACT
  Filled 2023-03-03 (×2): qty 3

## 2023-03-03 MED ORDER — IPRATROPIUM-ALBUTEROL 0.5-2.5 (3) MG/3ML IN SOLN
3.0000 mL | Freq: Four times a day (QID) | RESPIRATORY_TRACT | Status: DC
  Administered 2023-03-03 – 2023-03-09 (×25): 3 mL via RESPIRATORY_TRACT
  Filled 2023-03-03 (×24): qty 3

## 2023-03-03 MED ORDER — LACTATED RINGERS IV BOLUS: 500.0000 mL | Freq: Once | INTRAVENOUS | Status: DC

## 2023-03-03 NOTE — Evaluation (Addendum)
Clinical/Bedside Swallow Evaluation Patient Details  Name: Stephen Flynn MRN: [id number] Date of Birth: 10-26-1927  Today's Date: 03/03/2023 Time: SLP Start Time (ACUTE ONLY): 0844 SLP Stop Time (ACUTE ONLY): 0856 SLP Time Calculation (min) (ACUTE ONLY): 12 min  Past Medical History:  Past Medical History:  Diagnosis Date   Anemia    Aortic insufficiency    Chest pain    H/O: GI bleed    Hyperlipidemia    Hypertension    Leg cramps    Stroke [facility])    Past Surgical History:  Past Surgical History:  Procedure Laterality Date   BOWEL RESECTION N/A 02/26/2023   Procedure: SMALL BOWEL RESECTION;  Surgeon: Harriette Bouillon, MD;  Location: [facility] OR;  Service: General;  Laterality: N/A;   CARDIOVASCULAR STRESS TEST  09/04/2005   LAPAROTOMY N/A 02/26/2023   Procedure: EXPLORATORY LAPAROTOMY;  Surgeon: Harriette Bouillon, MD;  Location: [facility] OR;  Service: General;  Laterality: N/A;   US ECHOCARDIOGRAPHY  11/26/2007   ef 55-60%   HPI:  89 y/o male admitted 8/25 with abdominal pain, SOB with bowel obstruction. 8/26 developed hypotension and required ex-lap with small bowel resection and intubated. Extubated 8/27. PMhx: GIB, stroke, HTN, Afib, neuropathy, CHF, obesity    Assessment / Plan / Recommendation  Clinical Impression  Pt is presently not able to safely take po's from an awareness, alertness and medical standpoint. He is awake but periods of drowsiness. He did not follow commands nor elicit vocalizations.  Facial/labial musculature appeared symmetrical and he appears to be missing some dentition. Oral care provided. He exhibited delayed oral transit intermittently and held the last bite of puree that needed to be suctioned from oral cavity. Initiation of swallows were delayed needing verbal cues to swallow. At baseline he has mild increased work of breathing which increased mildly as po's given with audible respirations/secretions. He had delayed throat clears througout assessment with questionable  airway compromise. Recommend continue NPO and pt has Cortrak with continued ST determine ability to initiate po at bedside versus instrumental assessment.  ADDENDUM: As SLP documenting, pt's sats fell to 70's and he was placed back on Bipap   SLP Visit Diagnosis: Dysphagia, unspecified (R13.10)    Aspiration Risk  Moderate aspiration risk    Diet Recommendation NPO    Medication Administration: Via alternative means    Other  Recommendations Oral Care Recommendations: Oral care QID    Recommendations for follow up therapy are one component of a multi-disciplinary discharge planning process, led by the attending physician.  Recommendations may be updated based on patient status, additional functional criteria and insurance authorization.  Follow up Recommendations  (TBD)      Assistance Recommended at Discharge    Functional Status Assessment Patient has had a recent decline in their functional status and demonstrates the ability to make significant improvements in function in a reasonable and predictable amount of time.  Frequency and Duration min 2x/week  2 weeks       Prognosis Prognosis for improved oropharyngeal function: Good Barriers to Reach Goals: Cognitive deficits      Swallow Study   General Date of Onset: 02/26/23 HPI: 89 y/o male admitted 8/25 with abdominal pain, SOB with bowel obstruction. 8/26 developed hypotension and required ex-lap with small bowel resection and intubated. Extubated 8/27. PMhx: GIB, stroke, HTN, Afib, neuropathy, CHF, obesity Type of Study: Bedside Swallow Evaluation Previous Swallow Assessment:  (none) Diet Prior to this Study: NPO;Cortrak/Small bore NG tube Temperature Spikes Noted: No Respiratory Status: Nasal cannula  History of Recent Intubation: Yes Total duration of intubation (days):  (2) Date extubated: 02/27/23 Behavior/Cognition: Lethargic/Drowsy;Requires cueing;Cooperative Oral Cavity Assessment: Dry Oral Care Completed by  SLP: Yes Oral Cavity - Dentition: Missing dentition (missing scattered) Vision: Functional for self-feeding Self-Feeding Abilities: Needs assist Patient Positioning: Upright in bed Baseline Vocal Quality:  (did not verbalize) Volitional Cough: Cognitively unable to elicit Volitional Swallow: Unable to elicit    Oral/Motor/Sensory Function Overall Oral Motor/Sensory Function:  (did not follow commands)   Ice Chips Ice chips: Impaired Presentation: Spoon Oral Phase Functional Implications: Prolonged oral transit Pharyngeal Phase Impairments: Suspected delayed Swallow;Throat Clearing - Delayed   Thin Liquid Thin Liquid: Not tested    Nectar Thick Nectar Thick Liquid: Not tested   Honey Thick Honey Thick Liquid: Not tested   Puree Puree: Impaired Presentation: Spoon Oral Phase Functional Implications: Prolonged oral transit;Oral holding Pharyngeal Phase Impairments: Suspected delayed Swallow;Throat Clearing - Delayed   Solid     Solid: Not tested      Royce Macadamia 03/03/2023,9:13 AM

## 2023-03-03 NOTE — Progress Notes (Signed)
[facility] Progress Note  5 Days Post-Op  Subjective: CC-  On Bipap. More alert this morning. Denies n/v. No flatus or BM. TF running at 20cc/hr  Objective: Vital signs in last 24 hours: Temp:  [97.1 F (36.2 C)-98.3 F (36.8 C)] 97.5 F (36.4 C) (08/31 0742) Pulse Rate:  [67-83] 76 (08/31 0750) Resp:  [17-29] 23 (08/31 0750) BP: (109-149)/(75-101) 133/86 (08/31 0700) SpO2:  [91 %-100 %] 100 % (08/31 0750) FiO2 (%):  [30 %] 30 % (08/31 0750) Weight:  [105.3 kg] 105.3 kg (08/31 0500) Last BM Date : 03/02/23  Intake/Output from previous day: 08/30 0701 - 08/31 0700 In: 3838 [I.V.:2711.6; NG/GT:278.3; IV Piggyback:848.1] Out: 300 [Urine:300] Intake/Output this shift: No intake/output data recorded.  PE: Gen:  Alert, NAD, f/c HEENT: Cortrak present Card:  A fib, regular rate Pulm:  Bipap Abd: Soft, mild distension, mild left sided TTP without rebound or guarding, vac to midline with good seal, few bowel sounds heard  Lab Results:  Recent Labs    03/02/23 0600 03/02/23 0920 03/03/23 0325  WBC 7.8  --  5.8  HGB 8.1* 9.2* 7.6*  HCT 24.2* 27.0* 23.1*  PLT 246  --  233   BMET Recent Labs    03/02/23 0840 03/02/23 0920 03/03/23 0325  NA 139 140 137  K 4.0 4.0 4.0  CL 106  --  107  CO2 27  --  23  GLUCOSE 152*  --  175*  BUN 53*  --  61*  CREATININE 1.44*  --  1.40*  CALCIUM 9.0  --  8.8*   PT/INR No results for input(s): "LABPROT", "INR" in the last 72 hours. CMP     Component Value Date/Time   NA 137 03/03/2023 0325   K 4.0 03/03/2023 0325   CL 107 03/03/2023 0325   CO2 23 03/03/2023 0325   GLUCOSE 175 (H) 03/03/2023 0325   BUN 61 (H) 03/03/2023 0325   CREATININE 1.40 (H) 03/03/2023 0325   CALCIUM 8.8 (L) 03/03/2023 0325   PROT 5.6 (L) 03/01/2023 0322   ALBUMIN 2.4 (L) 03/02/2023 0840   AST 25 03/01/2023 0322   ALT 18 03/01/2023 0322   ALKPHOS 34 (L) 03/01/2023 0322   BILITOT 0.4 03/01/2023 0322   GFRNONAA 47 (L) 03/03/2023 0325    GFRAA >60 05/16/2019 1110   Lipase     Component Value Date/Time   LIPASE 16 02/25/2023 1433       Studies/Results: DG CHEST PORT 1 VIEW  Result Date: 03/02/2023 CLINICAL DATA:  Respiratory failure. EXAM: PORTABLE CHEST 1 VIEW COMPARISON:  02/26/2023 FINDINGS: Interval removal of the ET tube and enteric tube. Stable right-sided PIC catheter with tip along the upper SVC brachiocephalic junction region. Enlarged cardiopericardial silhouette. Calcified and tortuous aorta. There is widening of the mediastinum. Underinflation. Persistent left retrocardiac opacity with interstitial changes. No pneumothorax. Overlapping cardiac leads. IMPRESSION: Interval removal of the ET tube and enteric tube. Underinflation with persistent left retrocardiac opacity. Enlarged heart with widening of the mediastinum as seen previously. Calcified and dilated aorta. PICC Electronically Signed   By: Karen Kays M.D.   On: 03/02/2023 11:24   DG Abd Portable 1V  Result Date: 03/02/2023 CLINICAL DATA:  NG tube placement EXAM: PORTABLE ABDOMEN - 1 VIEW limited for tube placement COMPARISON:  X-ray 03/01/2023 and older.  CT 02/25/2023 FINDINGS: Limited x-ray for tube placement has a Dobbhoff tube with tip extending into the right upper quadrant of the abdomen, likely along the extreme distal  stomach or proximal duodenum. There is gas along colon elsewhere in the abdomen as well as some air in what appears to be the stomach. Under penetrated radiograph. IMPRESSION: Limited x-ray. Tube in place with the tip overlying the distal stomach or proximal duodenum Electronically Signed   By: Karen Kays M.D.   On: 03/02/2023 11:22   DG Abd Portable 1V  Result Date: 03/01/2023 CLINICAL DATA:  No bowel movement since surgery 2 days ago.  Ileus. EXAM: PORTABLE ABDOMEN - 1 VIEW COMPARISON:  February 28, 2023. FINDINGS: The bowel gas pattern is normal. Mild amount of stool is noted in transverse colon. Distal tip of nasogastric tube is seen  in proximal stomach. No radio-opaque calculi or other significant radiographic abnormality are seen. IMPRESSION: No abnormal bowel dilatation. Electronically Signed   By: Lupita Raider M.D.   On: 03/01/2023 10:11    Anti-infectives: Anti-infectives (From admission, onward)    Start     Dose/Rate Route Frequency Ordered Stop   03/01/23 1500  ceFEPIme (MAXIPIME) 2 g in sodium chloride 0.9 % 100 mL IVPB        2 g 200 mL/hr over 30 Minutes Intravenous Every 12 hours 03/01/23 1124 03/02/23 0356   03/01/23 0300  ceFEPIme (MAXIPIME) 2 g in sodium chloride 0.9 % 100 mL IVPB  Status:  Discontinued        2 g 200 mL/hr over 30 Minutes Intravenous Every 24 hours 02/28/23 1141 03/01/23 1124   02/26/23 1445  piperacillin-tazobactam (ZOSYN) IVPB 3.375 g  Status:  Discontinued        3.375 g 100 mL/hr over 30 Minutes Intravenous Every 8 hours 02/26/23 1436 02/26/23 1445   02/26/23 1445  ceFEPIme (MAXIPIME) 2 g in sodium chloride 0.9 % 100 mL IVPB  Status:  Discontinued        2 g 200 mL/hr over 30 Minutes Intravenous Every 12 hours 02/26/23 1446 02/28/23 1141   02/26/23 1445  metroNIDAZOLE (FLAGYL) IVPB 500 mg        500 mg 100 mL/hr over 60 Minutes Intravenous Every 12 hours 02/26/23 1446 03/02/23 1719   02/26/23 1015  meropenem (MERREM) 1 g in sodium chloride 0.9 % 100 mL IVPB        1 g 200 mL/hr over 30 Minutes Intravenous  Once 02/26/23 0917 02/26/23 1743        Assessment/Plan Small bowel obstruction involving a medial adhesion across the distal jejunum  -POD#5 s/p Exploratory laparotomy small bowel resection and primary anastomosis 8/26 Dr. Luisa Hart - approximately 120 cm of small bowel remaining  - Tolerating tube feedings at 20cc/hr. No flatus or BM today, but also no n/v. Patient is more alert today and seems to answer questions appropriately, but will check another film to help determine if we will advance tube feedings. Ultimately will need swallow eval when ready to attempt PO  intake - continue TPN - abdominal wound vac MWF  - mobilize as able, PT/OT    FEN - IVF, NPO, TF at 20cc/hr, TPN VTE - heparin gtt ID - maxipime/flagyl 8/26>> continue at least 5 days postop Foley - out 8/29 and voiding   AKI ABL anemia A fib RVR - On amiodarone drip, heparin gtt Wheelchair/walker bound HTN H/O CVA HLD Diastolic CHF DM    LOS: 6 days    Franne Forts, [facility] 03/03/2023, 8:11 AM Please see Amion for pager number during day hours 7:00am-4:30pm

## 2023-03-03 NOTE — Progress Notes (Signed)
NAMEDemorris Flynn, MRN:  [id number], DOB:  1927/09/24, LOS: 6 ADMISSION DATE:  02/25/2023, CONSULTATION DATE: 8/26 REFERRING MD: Dr. Randol Flynn, CHIEF COMPLAINT: Hypotension  History of Present Illness:  Patient is encephalopathic and/or intubated; therefore, history has been obtained from chart review.  89 year old male with past medical history as below, which is significant for atrial fibrillation on Eliquis, wheelchair-bound, HFpEF, and stroke who presented to [facility] emergency department on 8/25 with complaints of acute onset abdominal pain with no bowel movement for 3 days prior to presentation.  Also sounds like he had not been consistently taking his Eliquis.  Upon arrival to the emergency department the patient was noted to be in atrial fibrillation with RVR to the 120s.  Started on diltiazem infusion and placed on heparin drip.  CT of the abdomen and pelvis demonstrated small bowel obstruction and moderate ascites.  General surgery was consulted and recommended conservative management with NG tube decompression.  He was admitted to the hospitalist service.  In the a.m. hours of 8/26 the patient developed waning mental status and became nonverbal.  He also developed hypotension with systolic pressures in the 70s and desaturation to the 80s.  His abdomen became more firm and rigid and exquisitely tender to pain.  The surgical team was at bedside and determined the patient would likely require urgent surgery.  PCCM was consulted for hypotension and hypoxia.  Pertinent  Medical History   has a past medical history of Anemia, Aortic insufficiency, Chest pain, H/O: GI bleed, Hyperlipidemia, Hypertension, Leg cramps, and Stroke (HCC).   Significant Hospital Events: Including procedures, antibiotic start and stop dates in addition to other pertinent events   8/25 admit for SBO 8/26 acute abdomen, tx to ICU for pressors. PICC line placed. Underwent Ex lap and small bowel resection 8/27  Extubated 8/27  Interim History / Subjective:  Patient required BiPAP yesterday as he was getting lethargic, ABG showed rising pCO2 Just came off of BiPAP on 2 L nasal cannula oxygen Continue to have moderate amount of oral secretions  He is only for uric but serum creatinine is stable  Objective   Blood pressure 133/86, pulse 76, temperature (!) 97.5 F (36.4 C), temperature source Axillary, resp. rate (!) 23, height 5\' 9"  (1.753 m), weight 105.3 kg, SpO2 100%.    FiO2 (%):  [30 %] 30 % Pressure Support:  [6 cmH20] 6 cmH20   Intake/Output Summary (Last 24 hours) at 03/03/2023 0757 Last data filed at 03/03/2023 0700 Gross per 24 hour  Intake 3837.95 ml  Output 300 ml  Net 3537.95 ml   Filed Weights   03/01/23 0344 03/02/23 0500 03/03/23 0500  Weight: 99.7 kg 102.4 kg 105.3 kg    Examination: General: Chronically ill-appearing elderly male, lying on the bed HEENT: [place]/AT, eyes anicteric.  moist mucus membranes Neuro: Awake, intermittently following commands, weak cough and gag Chest: Bilateral wheezes all over, no crackles Heart: Irregularly irregular, no murmurs or gallops Abdomen: Soft, wound VAC in place  skin: No rash  Lab/imaging reviewed Significant for Na 137, potassium 4, BUN/creatinine 61/1.4 Hemoglobin 7.6, platelets 233  Resolved Hospital Problem list   Septic shock  Assessment & Plan:  Small bowel obstruction s/p ex lap and small bowel resection.  Acute abdomen Appreciate general surgery follow-up Tolerating trickle tube feeds so far Continue TPN Continue wound VAC Wound care is following  Acute respiratory failure with hypoxia and hypercapnia secondary to aspiration Patient became lethargic, ABG showed pH  7.2, required BiPAP yesterday, afternoon till this morning Now BiPAP is off, he is more awake and following commands Currently on 2 L nasal cannula oxygen He has bilateral wheezes Will add DuoNeb every 8 hours Patient continued to aspirate slowly,  x-ray chest looks worse  Paroxysmal atrial fibrillation with rapid ventricular response Heart rate is controlled, went back into A-fib Switch amiodarone to p.o. Switch IV heparin infusion to apixaban for stroke prophylaxis  Chronic HFrEF Action fraction is 40 to 45% Moderate LVH Monitor intake and output He is euvolemic, urine output is low but his serum creatinine is stable, closely watch  AKI, likely due to dehydration His baseline serum creatinine is 0.7, trended up to 1.5 Received 500 cc of IV fluid yesterday with improvement in serum creatinine to 1.4 Urine output is low Will give LR 500 today  Diabetes mellitus, controlled Hemoglobin A1c 6.5 CBG monitoring and sliding scale insulin with CBG goal 140-180  Baseline debility: Wheelchair-bound Full code per family  History of stroke Can resume home therapies when able to take p.o.  Best Practice (right click and "Reselect all SmartList Selections" daily)   Diet/type: NPO start trickle tube feeds.  On TPN DVT prophylaxis: Apixaban GI prophylaxis: PPI Lines: Central line Foley:  Yes, and it is still needed Code Status:  full code Last date of multidisciplinary goals of care discussion [02/26/23]  The patient is critically ill due to small bowel obstruction s/p resection, acute respiratory failure with hypoxia and hypercapnia, aspiration pneumonia critical care was necessary to treat or prevent imminent or life-threatening deterioration.  Critical care was time spent personally by me on the following activities: development of treatment plan with patient and/or surrogate as well as nursing, discussions with consultants, evaluation of patient's response to treatment, examination of patient, obtaining history from patient or surrogate, ordering and performing treatments and interventions, ordering and review of laboratory studies, ordering and review of radiographic studies, pulse oximetry, re-evaluation of patient's condition and  participation in multidisciplinary rounds.   During this encounter critical care time was devoted to patient care services described in this note for 35 minutes.     Stephen Fowler, MD [facility] See Amion for pager If no response to pager, please call [phone] until 7pm After 7pm, Please call E-link [phone]

## 2023-03-03 NOTE — Progress Notes (Signed)
PHARMACY - TOTAL PARENTERAL NUTRITION CONSULT NOTE   Indication: Small bowel obstruction and Short bowel syndrome  Patient Measurements: Height: 5\' 9"  (175.3 cm) Weight: 105.3 kg (232 lb 2.3 oz) IBW/kg (Calculated) : 70.7   Body mass index is 34.28 kg/m. Usual Weight: 104 kg at admission  Assessment: 89 y/o M admitted 8/25 for abdominal pain after eating breakfast, found with small bowel obstruction. Exlap performed 8/26, patient in continuity however large amount of bowel resected with ~120 cm remaining. Pharmacy consulted to initiate TPN.  Glucose / Insulin: CBG <180, A1c 6.5; 13 units SSI over 24h Electrolytes: Na 137, K 4.0, 8/28 iCa 1.37, CoCa 10.2 Renal: Scr 1.40 (baseline Scr ~0.9), BUN 61,  Hepatic: 8/26 AST/ALT wnl, Tbili 0.6, Albumin 2.7 Intake / Output; MIVF: Net 12.6L positive, 0.1 ml/kg/hr UOP. LBM 8/29 GI Imaging: 8/29 Abd [place]: mild, no abnormal dilatation GI Surgeries / Procedures:  No procedures since initiation of TPN  Central access: PICC  TPN start date: 8/27  Nutritional Goals: Goal TPN rate is 75 mL/hr (provides 104.4 g of protein and 1832 kcals per day)  RD Assessment: Estimated Needs Total Energy Estimated Needs: 1800-2000 kcal/d Total Protein Estimated Needs: 90-105g/d Total Fluid Estimated Needs: 1.8L/d  Current Nutrition:  TPN + Osmolite 1.5 @ 20/hr  Plan:  Continue TPN at goal rate of 75 mL/hr at 1800 (provides 104.4 g protein, 1814.4 kcal), for 100% of estimated needs Electrolytes in TPN: Na 50 mEq/L, K 40 mEq/L, Ca 0 mEq/L, Mg 5 mEq/L, and Phos 12 mmol/L. Cl:Ac 1:2 Continue standard MVI and trace elements to TPN Continue Moderate q4h SSI and adjust as needed  Monitor TPN labs on Mon/Thurs, BMP tomorrow AM Supplement thiamine 100 mg IV x 5 days outside of TPN -F/u tube feeding tolerance for TPN weaning   Calton Dach, PharmD, BCCCP Clinical Pharmacist 03/03/2023 7:50 AM

## 2023-03-03 NOTE — Progress Notes (Signed)
eLink Physician-Brief Progress Note Patient Name: Stephen Flynn DOB: 06-05-1928 MRN: [id number]   Date of Service  03/03/2023  HPI/Events of Note  Family was at bedside, wanted to talk about the options of the patient moving forward.  They had inquired about comfort measures vs.  tracheostomy and spoke with them at length regarding these two options.  Also offered the option of DNR/DNI and no escalation of care.   eICU Interventions  At this time, the have voiced that they were leaning towards the option of pursuing a tracheostomy, if needed.  Will maintain full code for now.         [name] 03/03/2023, 7:50 PM

## 2023-03-03 NOTE — Progress Notes (Signed)
eLink Physician-Brief Progress Note Patient Name: Stephen Flynn DOB: 02-Dec-1927 MRN: [id number]   Date of Service  03/03/2023  HPI/Events of Note  Notified of oliguria with urine output of only 250cc tonight.   Patient is a 89/F with CHF EF 40-45%, with acute abdomen.  eICU Interventions  Continue to monitor for now.  Will hold off on giving more fluids.      Intervention Category Intermediate Interventions: Oliguria - evaluation and management  Larinda Buttery 03/03/2023, 2:22 AM

## 2023-03-03 NOTE — Progress Notes (Signed)
Trial off BIPAP. Placed on 4L [place]. Patient able to vocalize name and location.

## 2023-03-04 ENCOUNTER — Inpatient Hospital Stay (HOSPITAL_COMMUNITY): Payer: 59

## 2023-03-04 DIAGNOSIS — R6521 Severe sepsis with septic shock: Secondary | ICD-10-CM | POA: Diagnosis not present

## 2023-03-04 DIAGNOSIS — K56609 Unspecified intestinal obstruction, unspecified as to partial versus complete obstruction: Secondary | ICD-10-CM | POA: Diagnosis not present

## 2023-03-04 DIAGNOSIS — A419 Sepsis, unspecified organism: Secondary | ICD-10-CM | POA: Diagnosis not present

## 2023-03-04 DIAGNOSIS — J9601 Acute respiratory failure with hypoxia: Secondary | ICD-10-CM

## 2023-03-04 LAB — BASIC METABOLIC PANEL
Anion gap: 9 (ref 5–15)
BUN: 74 mg/dL — ABNORMAL HIGH (ref 8–23)
CO2: 21 mmol/L — ABNORMAL LOW (ref 22–32)
Calcium: 9.1 mg/dL (ref 8.9–10.3)
Chloride: 110 mmol/L (ref 98–111)
Creatinine, Ser: 1.63 mg/dL — ABNORMAL HIGH (ref 0.61–1.24)
GFR, Estimated: 39 mL/min — ABNORMAL LOW (ref >60)
Glucose, Bld: 139 mg/dL — ABNORMAL HIGH (ref 70–99)
Potassium: 4.5 mmol/L (ref 3.5–5.1)
Sodium: 140 mmol/L (ref 135–145)

## 2023-03-04 LAB — CBC
HCT: 23.1 % — ABNORMAL LOW (ref 39.0–52.0)
Hemoglobin: 7.4 g/dL — ABNORMAL LOW (ref 13.0–17.0)
MCH: 30.2 pg (ref 26.0–34.0)
MCHC: 32 g/dL (ref 30.0–36.0)
MCV: 94.3 fL (ref 80.0–100.0)
Platelets: 227 10*3/uL (ref 150–400)
RBC: 2.45 MIL/uL — ABNORMAL LOW (ref 4.22–5.81)
RDW: 16.2 % — ABNORMAL HIGH (ref 11.5–15.5)
WBC: 5.7 10*3/uL (ref 4.0–10.5)
nRBC: 0.4 % — ABNORMAL HIGH (ref 0.0–0.2)

## 2023-03-04 LAB — POCT I-STAT 7, (LYTES, BLD GAS, ICA,H+H)
Acid-base deficit: 8 mmol/L — ABNORMAL HIGH (ref 0.0–2.0)
Bicarbonate: 20.3 mmol/L (ref 20.0–28.0)
Calcium, Ion: 1.39 mmol/L (ref 1.15–1.40)
HCT: 31 % — ABNORMAL LOW (ref 39.0–52.0)
Hemoglobin: 10.5 g/dL — ABNORMAL LOW (ref 13.0–17.0)
O2 Saturation: 100 %
Patient temperature: 98
Potassium: 4.5 mmol/L (ref 3.5–5.1)
Sodium: 142 mmol/L (ref 135–145)
TCO2: 22 mmol/L (ref 22–32)
pCO2 arterial: 51.5 mmHg — ABNORMAL HIGH (ref 32–48)
pH, Arterial: 7.203 — ABNORMAL LOW (ref 7.35–7.45)
pO2, Arterial: 456 mmHg — ABNORMAL HIGH (ref 83–108)

## 2023-03-04 LAB — GLUCOSE, CAPILLARY
Glucose-Capillary: 135 mg/dL — ABNORMAL HIGH (ref 70–99)
Glucose-Capillary: 144 mg/dL — ABNORMAL HIGH (ref 70–99)
Glucose-Capillary: 136 mg/dL — ABNORMAL HIGH (ref 70–99)
Glucose-Capillary: 140 mg/dL — ABNORMAL HIGH (ref 70–99)
Glucose-Capillary: 149 mg/dL — ABNORMAL HIGH (ref 70–99)
Glucose-Capillary: 167 mg/dL — ABNORMAL HIGH (ref 70–99)

## 2023-03-04 MED ORDER — FUROSEMIDE 10 MG/ML IJ SOLN
40.0000 mg | Freq: Once | INTRAMUSCULAR | Status: AC
  Administered 2023-03-04: 40 mg via INTRAVENOUS
  Filled 2023-03-04: qty 4

## 2023-03-04 MED ORDER — MIDAZOLAM HCL 2 MG/2ML IJ SOLN
INTRAMUSCULAR | Status: AC
  Filled 2023-03-04: qty 2

## 2023-03-04 MED ORDER — TRAVASOL 10 % IV SOLN
INTRAVENOUS | Status: AC
  Filled 2023-03-04: qty 1044

## 2023-03-04 MED ORDER — FENTANYL CITRATE PF 50 MCG/ML IJ SOSY: 100.0000 ug | PREFILLED_SYRINGE | Freq: Once | INTRAMUSCULAR | Status: DC

## 2023-03-04 MED ORDER — ROCURONIUM BROMIDE 10 MG/ML (PF) SYRINGE
PREFILLED_SYRINGE | INTRAVENOUS | Status: AC
  Administered 2023-03-04: 100 mg via INTRAVENOUS
  Filled 2023-03-04: qty 10

## 2023-03-04 MED ORDER — PHENYLEPHRINE 80 MCG/ML (10ML) SYRINGE FOR IV PUSH (FOR BLOOD PRESSURE SUPPORT)
PREFILLED_SYRINGE | INTRAVENOUS | Status: AC
  Administered 2023-03-04: 800 ug
  Filled 2023-03-04: qty 10

## 2023-03-04 MED ORDER — ETOMIDATE 2 MG/ML IV SOLN: 20.0000 mg | Freq: Once | INTRAVENOUS | Status: AC

## 2023-03-04 MED ORDER — ROCURONIUM BROMIDE 50 MG/5ML IV SOLN
100.0000 mg | Freq: Once | INTRAVENOUS | Status: AC
  Filled 2023-03-04: qty 10

## 2023-03-04 MED ORDER — NOREPINEPHRINE 4 MG/250ML-% IV SOLN
INTRAVENOUS | Status: AC
  Administered 2023-03-04: 8 ug/min via INTRAVENOUS
  Filled 2023-03-04: qty 250

## 2023-03-04 MED ORDER — NOREPINEPHRINE 4 MG/250ML-% IV SOLN
0.0000 ug/min | INTRAVENOUS | Status: DC
  Administered 2023-03-06: 2 ug/min via INTRAVENOUS
  Filled 2023-03-04 (×2): qty 250

## 2023-03-04 MED ORDER — DOCUSATE SODIUM 50 MG/5ML PO LIQD
100.0000 mg | Freq: Two times a day (BID) | ORAL | Status: DC
  Administered 2023-03-04 – 2023-03-10 (×13): 100 mg
  Filled 2023-03-04 (×15): qty 10

## 2023-03-04 MED ORDER — FENTANYL CITRATE PF 50 MCG/ML IJ SOSY
PREFILLED_SYRINGE | INTRAMUSCULAR | Status: AC
  Filled 2023-03-04: qty 2

## 2023-03-04 MED ORDER — ETOMIDATE 2 MG/ML IV SOLN
INTRAVENOUS | Status: AC
  Administered 2023-03-04: 20 mg via INTRAVENOUS
  Filled 2023-03-04: qty 20

## 2023-03-04 MED ORDER — PHENYLEPHRINE 80 MCG/ML (10ML) SYRINGE FOR IV PUSH (FOR BLOOD PRESSURE SUPPORT)
PREFILLED_SYRINGE | INTRAVENOUS | Status: AC
  Filled 2023-03-04: qty 10

## 2023-03-04 MED ORDER — ORAL CARE MOUTH RINSE
15.0000 mL | OROMUCOSAL | Status: DC
  Administered 2023-03-04 – 2023-03-08 (×41): 15 mL via OROMUCOSAL

## 2023-03-04 MED ORDER — PHENYLEPHRINE 80 MCG/ML (10ML) SYRINGE FOR IV PUSH (FOR BLOOD PRESSURE SUPPORT)
80.0000 ug | PREFILLED_SYRINGE | Freq: Once | INTRAVENOUS | Status: DC | PRN
  Filled 2023-03-04: qty 10

## 2023-03-04 MED ORDER — SODIUM BICARBONATE 8.4 % IV SOLN
100.0000 meq | Freq: Once | INTRAVENOUS | Status: AC
  Administered 2023-03-04: 100 meq via INTRAVENOUS

## 2023-03-04 MED ORDER — ROCURONIUM BROMIDE 10 MG/ML (PF) SYRINGE
PREFILLED_SYRINGE | INTRAVENOUS | Status: AC
  Filled 2023-03-04: qty 10

## 2023-03-04 MED ORDER — MIDAZOLAM HCL 2 MG/2ML IJ SOLN: 2.0000 mg | Freq: Once | INTRAMUSCULAR | Status: DC

## 2023-03-04 MED ORDER — POLYETHYLENE GLYCOL 3350 17 G PO PACK
17.0000 g | PACK | Freq: Every day | ORAL | Status: DC
  Administered 2023-03-04 – 2023-03-10 (×7): 17 g
  Filled 2023-03-04 (×8): qty 1

## 2023-03-04 MED ORDER — CHLORHEXIDINE GLUCONATE 0.12 % MT SOLN
15.0000 mL | Freq: Once | OROMUCOSAL | Status: AC
  Administered 2023-03-04: 15 mL via OROMUCOSAL

## 2023-03-04 MED ORDER — DEXMEDETOMIDINE HCL IN NACL 400 MCG/100ML IV SOLN
0.0000 ug/kg/h | INTRAVENOUS | Status: DC
  Administered 2023-03-04: 0.4 ug/kg/h via INTRAVENOUS
  Administered 2023-03-04: 0.8 ug/kg/h via INTRAVENOUS
  Administered 2023-03-05: 0.4 ug/kg/h via INTRAVENOUS
  Administered 2023-03-05: 0.8 ug/kg/h via INTRAVENOUS
  Administered 2023-03-05: 0.7 ug/kg/h via INTRAVENOUS
  Administered 2023-03-06: 0.4 ug/kg/h via INTRAVENOUS
  Administered 2023-03-06 – 2023-03-08 (×3): 0.3 ug/kg/h via INTRAVENOUS
  Filled 2023-03-04 (×10): qty 100

## 2023-03-04 MED ORDER — ETOMIDATE 2 MG/ML IV SOLN
INTRAVENOUS | Status: AC
  Filled 2023-03-04: qty 20

## 2023-03-04 MED ORDER — ORAL CARE MOUTH RINSE: 15.0000 mL | OROMUCOSAL | Status: DC | PRN

## 2023-03-04 MED ORDER — SODIUM CHLORIDE 0.9 % IV SOLN
1.0000 g | Freq: Two times a day (BID) | INTRAVENOUS | Status: AC
  Administered 2023-03-04 – 2023-03-10 (×14): 1 g via INTRAVENOUS
  Filled 2023-03-04 (×13): qty 20

## 2023-03-04 MED ORDER — SODIUM BICARBONATE 8.4 % IV SOLN
INTRAVENOUS | Status: AC
  Filled 2023-03-04: qty 100

## 2023-03-04 NOTE — Procedures (Signed)
Bronchoscopy Procedure Note  Stephen Flynn  [id number]  19-Mar-1928  Date:03/04/23  Time:8:43 AM   Provider Performing:Amai Cappiello   Procedure(s):  Flexible bronchoscopy with bronchial alveolar lavage (40981) and Initial Therapeutic Aspiration of Tracheobronchial Tree (19147)  Indication(s) Acute respiratory failure with hypoxia/large volume aspiration  Consent Risks of the procedure as well as the alternatives and risks of each were explained to the patient and/or caregiver.  Consent for the procedure was obtained and is signed in the bedside chart  Anesthesia Etomidate and rocuronium   Time Out Verified patient identification, verified procedure, site/side was marked, verified correct patient position, special equipment/implants available, medications/allergies/relevant history reviewed, required imaging and test results available.   Sterile Technique Usual hand hygiene, masks, gowns, and gloves were used   Procedure Description Bronchoscope advanced through endotracheal tube and into airway.  Airways were examined down to subsegmental level with findings noted below.   Following diagnostic evaluation, BAL(s) performed in right lower lobe with normal saline and return of greenish pus fluid and Therapeutic aspiration performed in throughout the airway tree  Findings: Copious amount of tenacious greenish secretions noted throughout the respiratory tree likely due to aspiration, mucosa looked inflamed and right lower lobe   Complications/Tolerance None; patient tolerated the procedure well. Chest X-ray is needed post procedure.   EBL Minimal   Specimen(s) BAL

## 2023-03-04 NOTE — Progress Notes (Signed)
eLink Physician-Brief Progress Note Patient Name: Stephen Flynn DOB: Dec 10, 1927 MRN: [id number]   Date of Service  03/04/2023  HPI/Events of Note  Notified this AM that pt was unresponsive.  Opened eyes temporarily to sternal rub.  Attempts to obtain ABG was unsuccessful.  Pt had been compliant with bipap, with TV >566ml on current settings.  Bicarb had been 21 on AM labs.   Had spoken with family last night re: possible reintubation, which they were agreeable with.  Called wife, Stephen Flynn, this morning, and updated her of the events and possible intubation. She said to intubate if he were to require intubation.    eICU Interventions  Tried to suction pt.  When RT had taken the mask off, pt started to grimace, and open eyes.   He was able to clench his teeth and resist being suctioned.  He was able to show a cough and gag response.   Will hold off intubation for now.           Ulyssa Walthour M DELA CRUZ 03/04/2023, 6:46 AM

## 2023-03-04 NOTE — Progress Notes (Signed)
Patient was intubated, bronchoscopy was done which showed copious amount of greenish secretions, BAL was performed and sent to the lab  Postintubation ABG showed pH 7.19, pCO2 58, PaO2 456 and oxygen saturation 99%   Increased respiratory rate from 20 now to 26, decrease FiO2 to 50% and will give 2 A of bicarbonate   Cheri Fowler, MD

## 2023-03-04 NOTE — IPAL (Signed)
  Interdisciplinary Goals of Care Family Meeting   Date carried out: 03/04/2023  Location of the meeting: Bedside  Member's involved: Physician, Bedside Registered Nurse, and Family Member or next of kin  Durable Power of Attorney or acting medical decision maker: Larita Twiggs/Doris Hust    Discussion: We discussed goals of care for [employer] .  Clinical condition was updated to patient's family at bedside in detail, explained that he is slowly aspirating into his airway because he has no gag or cough reflex, with his current acute illness his function of swallowing also got malfunction.  He will remain at high risk of aspirating into airway, currently he is not responding even on BiPAP is hard to wake up.  I explained that there are 2 options from here 1.  Proceed with endotracheal intubation and proceeding with tracheostomy, if he can come off of ventilator that is well in good, if he cannot then he will stay in nursing home hooked up to ventilator 2.  Second option is to proceed with comfort care and focus on symptom management.  After discussion amongst family member they decided to proceed with endotracheal intubation, and deciding for tracheostomy and will be back after discussing with other family members by tomorrow.  He will remain full code with full scope of care  Code status:   Code Status: Full Code   Disposition: Continue current acute care    Cheri Fowler, MD  Pulmonary Critical Care See Amion for pager If no response to pager, please call [phone] until 7pm After 7pm, Please call E-link [phone]

## 2023-03-04 NOTE — Progress Notes (Signed)
BAL walked to lab

## 2023-03-04 NOTE — Progress Notes (Signed)
eLink Physician-Brief Progress Note Patient Name: Stephen Flynn DOB: 1928-06-04 MRN: [id number]   Date of Service  03/04/2023  HPI/Events of Note  Pt without UO over the past 6 hours.  CVP (measured from PICC) is 17.  Review of I/Os show pt to be +14.5L Cr 1.4, Bicarb 23  eICU Interventions  Will give one time dose of lasix 40mg  IV.  Will follow response.         [name] M DELA CRUZ 03/04/2023, 2:03 AM

## 2023-03-04 NOTE — Progress Notes (Addendum)
NAMEPao Flynn, MRN:  [id number], DOB:  01/27/1928, LOS: 7 ADMISSION DATE:  02/25/2023, CONSULTATION DATE: 8/26 REFERRING MD: Dr. Randol Flynn, CHIEF COMPLAINT: Hypotension  History of Present Illness:  Patient is encephalopathic and/or intubated; therefore, history has been obtained from chart review.  89 year old male with past medical history as below, which is significant for atrial fibrillation on Eliquis, wheelchair-bound, HFpEF, and stroke who presented to [facility] emergency department on 8/25 with complaints of acute onset abdominal pain with no bowel movement for 3 days prior to presentation.  Also sounds like he had not been consistently taking his Eliquis.  Upon arrival to the emergency department the patient was noted to be in atrial fibrillation with RVR to the 120s.  Started on diltiazem infusion and placed on heparin drip.  CT of the abdomen and pelvis demonstrated small bowel obstruction and moderate ascites.  General surgery was consulted and recommended conservative management with NG tube decompression.  He was admitted to the hospitalist service.  In the a.m. hours of 8/26 the patient developed waning mental status and became nonverbal.  He also developed hypotension with systolic pressures in the 70s and desaturation to the 80s.  His abdomen became more firm and rigid and exquisitely tender to pain.  The surgical team was at bedside and determined the patient would likely require urgent surgery.  PCCM was consulted for hypotension and hypoxia.  Pertinent  Medical History   has a past medical history of Anemia, Aortic insufficiency, Chest pain, H/O: GI bleed, Hyperlipidemia, Hypertension, Leg cramps, and Stroke (HCC).   Significant Hospital Events: Including procedures, antibiotic start and stop dates in addition to other pertinent events   8/25 admit for SBO 8/26 acute abdomen, tx to ICU for pressors. PICC line placed. Underwent Ex lap and small bowel resection 8/27  Extubated 8/27  Interim History / Subjective:  Patient required BiPAP overnight, he became obtunded this morning was not waking up on a sternal rub After discussion with patient's family they decided to proceed with endotracheal intubation He is afebrile  He is not making much urine, serum creatinine trended up  Objective   Blood pressure (!) 90/58, pulse 82, temperature 98 F (36.7 C), temperature source Axillary, resp. rate 18, height 5\' 9"  (1.753 m), weight 106.9 kg, SpO2 98%.    FiO2 (%):  [30 %] 30 % Pressure Support:  [6 cmH20] 6 cmH20   Intake/Output Summary (Last 24 hours) at 03/04/2023 0733 Last data filed at 03/04/2023 0700 Gross per 24 hour  Intake 3084.97 ml  Output 475 ml  Net 2609.97 ml   Filed Weights   03/02/23 0500 03/03/23 0500 03/04/23 0347  Weight: 102.4 kg 105.3 kg 106.9 kg    Examination: General: Crtitically ill-appearing male, lying on the bed, on BiPAP HEENT: [place]/AT, eyes anicteric.  Dry mucous membranes Neuro: Eyes closed, does not open, not following commands, not waking up on sternal rub, absent cough and gag Chest: Bilateral crackles all over, no wheezes or rhonchi Heart: Globally irregular, no murmurs or gallops Abdomen: Soft, nondistended, wound VAC in place Skin: No rash   Lab/imaging reviewed Significant for Na 140, potassium 4.5, BUN/creatinine 74/1.6 Hemoglobin 7.4, platelets 227  Resolved Hospital Problem list     Assessment & Plan:  Small bowel obstruction s/p ex lap and small bowel resection.  Acute abdomen Appreciate general surgery follow-up Continue tube feeding at 40 cc/h Continue TPN Continue wound VAC Wound care is following  Acute respiratory failure with  hypoxia and hypercapnia secondary to aspiration Sepsis with septic shock due to aspiration pneumonia Acute septic encephalopathy Patient became lethargic again overnight, requiring BiPAP, this morning he is found to be obtunded ABG showed pH 7.3 with pCO2 48 He is  visibly aspirating, has no gag and cough Spoke with patient's family, they decided to proceed with endotracheal intubation and aggressive care He is getting hypotensive, requiring vasopressor support Will proceed with endotracheal intubation and bronchoscopy Will start sedation with Precedex post intubation with RASS goal -1 Started on meropenem considering penicillin allergies Follow-up respiratory culture and adjust antibiotic accordingly  Paroxysmal atrial fibrillation with rapid ventricular response Heart rate is controlled, remain in A-fib Continue amiodarone p.o. Continue apixaban for stroke prophylaxis  Chronic HFrEF Action fraction is 40 to 45% Moderate LVH Monitor intake and output  AKI, likely due to dehydration His baseline serum creatinine is 0.7, his urine output is decreasing Serum creatinine trended up to 1.6 He received Lasix 40 mg x 1 Monitor intake and output Avoid nephrotoxic agent  Diabetes mellitus, controlled Hemoglobin A1c 6.5 CBG monitoring and sliding scale insulin with CBG goal 140-180  Baseline debility: Wheelchair-bound Full code per family  Moderate malnutrition On TPN and tube feeds Dietitian is following   Best Practice (right click and "Reselect all SmartList Selections" daily)   Diet/type: NPO  tube feeds.  On TPN DVT prophylaxis: Apixaban GI prophylaxis: PPI Lines: Central line Foley:  Yes, and it is still needed Code Status:  full code Last date of multidisciplinary goals of care discussion [03/04/23: Please Ipal note, basically to proceed with aggressive care including endotracheal intubation]  The patient is critically ill due to small bowel obstruction s/p resection, acute respiratory failure with hypoxia and hypercapnia, septic shock due to aspiration pneumonia critical care was necessary to treat or prevent imminent or life-threatening deterioration.  Critical care was time spent personally by me on the following activities:  development of treatment plan with patient and/or surrogate as well as nursing, discussions with consultants, evaluation of patient's response to treatment, examination of patient, obtaining history from patient or surrogate, ordering and performing treatments and interventions, ordering and review of laboratory studies, ordering and review of radiographic studies, pulse oximetry, re-evaluation of patient's condition and participation in multidisciplinary rounds.   During this encounter critical care time was devoted to patient care services described in this note for 49 minutes.     Cheri Fowler, MD [place] [facility] Critical [facility] See Amion for pager If no response to pager, please call [phone] until 7pm After 7pm, Please call E-link [phone]

## 2023-03-04 NOTE — Progress Notes (Signed)
[facility] Progress Note  6 Days Post-Op  Subjective: CC-  Reintubated this morning. Yesterday tolerated TF at 30cc/hr. No n/v. No BM.  Objective: Vital signs in last 24 hours: Temp:  [96.3 F (35.7 C)-98 F (36.7 C)] 98 F (36.7 C) (09/01 0700) Pulse Rate:  [75-105] 82 (09/01 0700) Resp:  [18-24] 18 (09/01 0700) BP: (71-132)/(53-107) 90/58 (09/01 0700) SpO2:  [88 %-100 %] 100 % (09/01 0745) FiO2 (%):  [30 %-100 %] 100 % (09/01 0745) Weight:  [106.9 kg] 106.9 kg (09/01 0347) Last BM Date : 03/02/23  Intake/Output from previous day: 08/31 0701 - 09/01 0700 In: 3085 [I.V.:1839.1; NG/GT:745.8; IV Piggyback:500] Out: 475 [Urine:400; Drains:75] Intake/Output this shift: No intake/output data recorded.  PE: Gen:  sedated on the vent HEENT: ETT, [place]rak present Card:  A fib, regular rate Pulm:  mechanically ventilated Abd: Soft, mild distension, few bowel sounds heard, vac to midline with good seal and scant serosanguinous fluid in canister  Lab Results:  Recent Labs    03/03/23 0325 03/04/23 0135  WBC 5.8 5.7  HGB 7.6* 7.4*  HCT 23.1* 23.1*  PLT 233 227   BMET Recent Labs    03/03/23 0325 03/04/23 0135  NA 137 140  K 4.0 4.5  CL 107 110  CO2 23 21*  GLUCOSE 175* 139*  BUN 61* 74*  CREATININE 1.40* 1.63*  CALCIUM 8.8* 9.1   PT/INR No results for input(s): "LABPROT", "INR" in the last 72 hours. CMP     Component Value Date/Time   NA 140 03/04/2023 0135   K 4.5 03/04/2023 0135   CL 110 03/04/2023 0135   CO2 21 (L) 03/04/2023 0135   GLUCOSE 139 (H) 03/04/2023 0135   BUN 74 (H) 03/04/2023 0135   CREATININE 1.63 (H) 03/04/2023 0135   CALCIUM 9.1 03/04/2023 0135   PROT 5.6 (L) 03/01/2023 0322   ALBUMIN 2.4 (L) 03/02/2023 0840   AST 25 03/01/2023 0322   ALT 18 03/01/2023 0322   ALKPHOS 34 (L) 03/01/2023 0322   BILITOT 0.4 03/01/2023 0322   GFRNONAA 39 (L) 03/04/2023 0135   GFRAA >60 05/16/2019 1110   Lipase     Component Value  Date/Time   LIPASE 16 02/25/2023 1433       Studies/Results: DG Abd Portable 1V  Result Date: 03/03/2023 CLINICAL DATA:  Ileus. EXAM: PORTABLE ABDOMEN - 1 VIEW COMPARISON:  Abdominal radiograph dated 03/02/2023. FINDINGS: The bowel gas pattern is normal. Free intraperitoneal air and air-fluid levels can not be excluded on the supine exam. An enteric tube terminates in the stomach. Degenerative changes are seen in the spine. IMPRESSION: Nonobstructive bowel gas pattern. Electronically Signed   By: Romona Curls M.D.   On: 03/03/2023 09:12   DG CHEST PORT 1 VIEW  Result Date: 03/02/2023 CLINICAL DATA:  Respiratory failure. EXAM: PORTABLE CHEST 1 VIEW COMPARISON:  02/26/2023 FINDINGS: Interval removal of the ET tube and enteric tube. Stable right-sided PIC catheter with tip along the upper SVC brachiocephalic junction region. Enlarged cardiopericardial silhouette. Calcified and tortuous aorta. There is widening of the mediastinum. Underinflation. Persistent left retrocardiac opacity with interstitial changes. No pneumothorax. Overlapping cardiac leads. IMPRESSION: Interval removal of the ET tube and enteric tube. Underinflation with persistent left retrocardiac opacity. Enlarged heart with widening of the mediastinum as seen previously. Calcified and dilated aorta. PICC Electronically Signed   By: Karen Kays M.D.   On: 03/02/2023 11:24   DG Abd Portable 1V  Result Date: 03/02/2023 CLINICAL DATA:  NG  tube placement EXAM: PORTABLE ABDOMEN - 1 VIEW limited for tube placement COMPARISON:  X-ray 03/01/2023 and older.  CT 02/25/2023 FINDINGS: Limited x-ray for tube placement has a Dobbhoff tube with tip extending into the right upper quadrant of the abdomen, likely along the extreme distal stomach or proximal duodenum. There is gas along colon elsewhere in the abdomen as well as some air in what appears to be the stomach. Under penetrated radiograph. IMPRESSION: Limited x-ray. Tube in place with the tip  overlying the distal stomach or proximal duodenum Electronically Signed   By: Karen Kays M.D.   On: 03/02/2023 11:22    Anti-infectives: Anti-infectives (From admission, onward)    Start     Dose/Rate Route Frequency Ordered Stop   03/01/23 1500  ceFEPIme (MAXIPIME) 2 g in sodium chloride 0.9 % 100 mL IVPB        2 g 200 mL/hr over 30 Minutes Intravenous Every 12 hours 03/01/23 1124 03/02/23 0356   03/01/23 0300  ceFEPIme (MAXIPIME) 2 g in sodium chloride 0.9 % 100 mL IVPB  Status:  Discontinued        2 g 200 mL/hr over 30 Minutes Intravenous Every 24 hours 02/28/23 1141 03/01/23 1124   02/26/23 1445  piperacillin-tazobactam (ZOSYN) IVPB 3.375 g  Status:  Discontinued        3.375 g 100 mL/hr over 30 Minutes Intravenous Every 8 hours 02/26/23 1436 02/26/23 1445   02/26/23 1445  ceFEPIme (MAXIPIME) 2 g in sodium chloride 0.9 % 100 mL IVPB  Status:  Discontinued        2 g 200 mL/hr over 30 Minutes Intravenous Every 12 hours 02/26/23 1446 02/28/23 1141   02/26/23 1445  metroNIDAZOLE (FLAGYL) IVPB 500 mg        500 mg 100 mL/hr over 60 Minutes Intravenous Every 12 hours 02/26/23 1446 03/02/23 1719   02/26/23 1015  meropenem (MERREM) 1 g in sodium chloride 0.9 % 100 mL IVPB        1 g 200 mL/hr over 30 Minutes Intravenous  Once 02/26/23 0917 02/26/23 1743        Assessment/Plan Small bowel obstruction involving a medial adhesion across the distal jejunum  -POD#6 s/p Exploratory laparotomy small bowel resection and primary anastomosis 8/26 Dr. Luisa Hart - approximately 120 cm of small bowel remaining  - Reintubated this morning. Discussed with primary, not currently on pressors. Ok to advance TF to 40cc/hr, if he requires pressors may need to hold these. Continue TPN. - abdominal wound vac MWF    FEN - IVF, NPO, TF at 40cc/hr, TPN VTE - heparin gtt ID - completed 5d postop.  Foley - out 8/29 and voiding   AKI ABL anemia A fib RVR - On amiodarone drip, heparin  gtt Wheelchair/walker bound HTN H/O CVA HLD Diastolic CHF DM    LOS: 7 days    Franne Forts, [facility] 03/04/2023, 8:29 AM Please see Amion for pager number during day hours 7:00am-4:30pm

## 2023-03-04 NOTE — Procedures (Signed)
Intubation Procedure Note  Stephen Flynn  [id number]  05-30-1928  Date:03/04/23  Time:8:42 AM   Provider Performing:Americo Vallery    Procedure: Intubation (31500)  Indication(s) Respiratory Failure  Consent Risks of the procedure as well as the alternatives and risks of each were explained to the patient and/or caregiver.  Consent for the procedure was obtained and is signed in the bedside chart   Anesthesia Etomidate and Rocuronium   Time Out Verified patient identification, verified procedure, site/side was marked, verified correct patient position, special equipment/implants available, medications/allergies/relevant history reviewed, required imaging and test results available.   Sterile Technique Usual hand hygeine, masks, and gloves were used   Procedure Description Patient positioned in bed supine.  Sedation given as noted above.  Patient was intubated with endotracheal tube using  MAC 4 .  View was Grade 2 only posterior commissure .  Number of attempts was 1.  Colorimetric CO2 detector was consistent with tracheal placement.   Complications/Tolerance None; patient tolerated the procedure well. Chest X-ray is ordered to verify placement.   EBL Minimal   Specimen(s) None

## 2023-03-04 NOTE — Progress Notes (Addendum)
PHARMACY - TOTAL PARENTERAL NUTRITION CONSULT NOTE   Indication: Small bowel obstruction and Short bowel syndrome  Patient Measurements: Height: 5\' 9"  (175.3 cm) Weight: 106.9 kg (235 lb 10.8 oz) IBW/kg (Calculated) : 70.7   Body mass index is 34.8 kg/m. Usual Weight: 104 kg at admission  Assessment: 89 y/o M admitted 8/25 for abdominal pain after eating breakfast, found with small bowel obstruction. Exlap performed 8/26, patient in continuity however large amount of bowel resected with ~120 cm remaining. Pharmacy consulted to initiate TPN.  Glucose / Insulin: CBG 136-147, A1c 6.5; 12 units SSI over 24h Electrolytes: Na 142, K 4.5, 8/28 iCa 1.37, CoCa 10.2, Cl 110, CO2 21 Renal: Scr 1.6 (baseline Scr ~0.9), BUN 74, UOP decreased 700 mL to 400 mL Hepatic: 8/26 AST/ALT wnl, Tbili 0.4, Albumin 2.4 Intake / Output; MIVF: Net +15L, 0.2 ml/kg/hr UOP. LBM 8/29 GI Imaging: 8/29 Abd [place]: mild, no abnormal dilatation GI Surgeries / Procedures:  No procedures since initiation of TPN  Central access: PICC  TPN start date: 8/27  Nutritional Goals: Goal TPN rate is 75 mL/hr (provides 104.4 g of protein and 1832 kcals per day)  RD Assessment: Estimated Needs Total Energy Estimated Needs: 1800-2000 kcal/d Total Protein Estimated Needs: 90-105g/d Total Fluid Estimated Needs: 1.8L/d  Current Nutrition:  TPN + Osmolite 1.5 @ 40/hr, advancing 10 mL q12h  Plan:  Continue TPN at goal rate of 75 mL/hr at 1800 (provides 104.4 g protein, 1814.4 kcal), for 100% of estimated needs Electrolytes in TPN: Na 50 mEq/L, decrease K 30 mEq/L (total 72 > 54 mEq), Ca 0 mEq/L, Mg 5 mEq/L, and Phos 12 mmol/L. Adj to maximize acetate Continue standard MVI and trace elements to TPN Continue Moderate q4h SSI and adjust as needed  Monitor TPN labs on Mon/Thurs, BMP tomorrow AM Completed thiamine 100 mg IV x 5 days Tolerating advancing tube feeds Discussed w/ surgery, wean TPN once patient has bowel  movement  Rutherford Nail, PharmD PGY2 Critical Care Pharmacy Resident 03/04/2023 10:21 AM

## 2023-03-04 DEATH — deceased

## 2023-03-05 ENCOUNTER — Inpatient Hospital Stay (HOSPITAL_COMMUNITY): Payer: 59

## 2023-03-05 DIAGNOSIS — J690 Pneumonitis due to inhalation of food and vomit: Secondary | ICD-10-CM

## 2023-03-05 DIAGNOSIS — J9601 Acute respiratory failure with hypoxia: Secondary | ICD-10-CM | POA: Diagnosis not present

## 2023-03-05 DIAGNOSIS — K56609 Unspecified intestinal obstruction, unspecified as to partial versus complete obstruction: Secondary | ICD-10-CM | POA: Diagnosis not present

## 2023-03-05 LAB — GLUCOSE, CAPILLARY
Glucose-Capillary: 164 mg/dL — ABNORMAL HIGH (ref 70–99)
Glucose-Capillary: 152 mg/dL — ABNORMAL HIGH (ref 70–99)
Glucose-Capillary: 161 mg/dL — ABNORMAL HIGH (ref 70–99)
Glucose-Capillary: 136 mg/dL — ABNORMAL HIGH (ref 70–99)
Glucose-Capillary: 126 mg/dL — ABNORMAL HIGH (ref 70–99)
Glucose-Capillary: 123 mg/dL — ABNORMAL HIGH (ref 70–99)

## 2023-03-05 LAB — PHOSPHORUS: Phosphorus: 3.2 mg/dL (ref 2.5–4.6)

## 2023-03-05 LAB — COMPREHENSIVE METABOLIC PANEL
ALT: 14 U/L (ref 0–44)
AST: 11 U/L — ABNORMAL LOW (ref 15–41)
Albumin: 2.2 g/dL — ABNORMAL LOW (ref 3.5–5.0)
Alkaline Phosphatase: 35 U/L — ABNORMAL LOW (ref 38–126)
Anion gap: 7 (ref 5–15)
BUN: 87 mg/dL — ABNORMAL HIGH (ref 8–23)
CO2: 21 mmol/L — ABNORMAL LOW (ref 22–32)
Calcium: 9 mg/dL (ref 8.9–10.3)
Chloride: 114 mmol/L — ABNORMAL HIGH (ref 98–111)
Creatinine, Ser: 1.71 mg/dL — ABNORMAL HIGH (ref 0.61–1.24)
GFR, Estimated: 37 mL/min — ABNORMAL LOW (ref >60)
Glucose, Bld: 181 mg/dL — ABNORMAL HIGH (ref 70–99)
Potassium: 4.1 mmol/L (ref 3.5–5.1)
Sodium: 142 mmol/L (ref 135–145)
Total Bilirubin: 0.4 mg/dL (ref 0.3–1.2)
Total Protein: 5.6 g/dL — ABNORMAL LOW (ref 6.5–8.1)

## 2023-03-05 LAB — TRIGLYCERIDES: Triglycerides: 35 mg/dL (ref <150)

## 2023-03-05 LAB — CBC
HCT: 22.2 % — ABNORMAL LOW (ref 39.0–52.0)
Hemoglobin: 7.5 g/dL — ABNORMAL LOW (ref 13.0–17.0)
MCH: 30.4 pg (ref 26.0–34.0)
MCHC: 33.8 g/dL (ref 30.0–36.0)
MCV: 89.9 fL (ref 80.0–100.0)
Platelets: 235 10*3/uL (ref 150–400)
RBC: 2.47 MIL/uL — ABNORMAL LOW (ref 4.22–5.81)
RDW: 15.9 % — ABNORMAL HIGH (ref 11.5–15.5)
WBC: 5.3 10*3/uL (ref 4.0–10.5)
nRBC: 0.4 % — ABNORMAL HIGH (ref 0.0–0.2)

## 2023-03-05 LAB — MAGNESIUM: Magnesium: 2.3 mg/dL (ref 1.7–2.4)

## 2023-03-05 MED ORDER — HYDROMORPHONE HCL 1 MG/ML IJ SOLN
0.5000 mg | INTRAMUSCULAR | Status: DC | PRN
  Administered 2023-03-05 – 2023-03-08 (×7): 0.5 mg via INTRAVENOUS
  Filled 2023-03-05 (×7): qty 0.5

## 2023-03-05 MED ORDER — TRAVASOL 10 % IV SOLN
INTRAVENOUS | Status: AC
  Filled 2023-03-05: qty 1044

## 2023-03-05 MED ORDER — BISACODYL 10 MG RE SUPP
10.0000 mg | Freq: Once | RECTAL | Status: AC
  Administered 2023-03-05: 10 mg via RECTAL
  Filled 2023-03-05: qty 1

## 2023-03-05 MED ORDER — AMIODARONE HCL 200 MG PO TABS
200.0000 mg | ORAL_TABLET | Freq: Two times a day (BID) | ORAL | Status: DC
  Administered 2023-03-05 – 2023-03-16 (×20): 200 mg
  Filled 2023-03-05 (×20): qty 1

## 2023-03-05 MED ORDER — SODIUM CHLORIDE 0.9 % IV SOLN: INTRAVENOUS | Status: DC | PRN

## 2023-03-05 NOTE — Progress Notes (Signed)
PT Cancellation Note  Patient Details Name: Bodee Por MRN: [id number] DOB: [birth year]   Cancelled Treatment:    Reason Eval/Treat Not Completed: Patient not medically ready (pt reintubated and not medically appropriate)   Ziare Cryder B Aishia Barkey 03/05/2023, 7:43 AM [name], PT Acute Rehabilitation Services Office: [phone]

## 2023-03-05 NOTE — Progress Notes (Signed)
SLP Cancellation Note  Patient Details Name: Stephen Flynn MRN: [id number] DOB: 11-08-1927   Cancelled treatment:        Attempted to see pt for PO trials.  Pt reintubated and requiring ventilator support.  Pt with medical decline since evaluation 8/31.  Pt with no gag or cough reflex observed by critical care yesterday with suspicion for ongoing aspiration of secretions. He required bronchoscopy with copious greenish secretions. Family is discussing whether to pursue tracheostomy.  SLP will follow for medical readiness for further assessment.   Kerrie Pleasure, MA, CCC-SLP Acute Rehabilitation Services Office: [phone] 03/05/2023, 9:00 AM

## 2023-03-05 NOTE — Progress Notes (Signed)
NAMEDquan Rossow, MRN:  [id number], DOB:  06-Jan-1928, LOS: 8 ADMISSION DATE:  02/25/2023, CONSULTATION DATE: 8/26 REFERRING MD: Dr. Randol Kern, CHIEF COMPLAINT: Hypotension  History of Present Illness:  Patient is encephalopathic and/or intubated; therefore, history has been obtained from chart review.  89 year old male with past medical history as below, which is significant for atrial fibrillation on Eliquis, wheelchair-bound, HFpEF, and stroke who presented to [facility] emergency department on 8/25 with complaints of acute onset abdominal pain with no bowel movement for 3 days prior to presentation.  Also sounds like he had not been consistently taking his Eliquis.  Upon arrival to the emergency department the patient was noted to be in atrial fibrillation with RVR to the 120s.  Started on diltiazem infusion and placed on heparin drip.  CT of the abdomen and pelvis demonstrated small bowel obstruction and moderate ascites.  General surgery was consulted and recommended conservative management with NG tube decompression.  He was admitted to the hospitalist service.  In the a.m. hours of 8/26 the patient developed waning mental status and became nonverbal.  He also developed hypotension with systolic pressures in the 70s and desaturation to the 80s.  His abdomen became more firm and rigid and exquisitely tender to pain.  The surgical team was at bedside and determined the patient would likely require urgent surgery.  PCCM was consulted for hypotension and hypoxia.  Pertinent  Medical History   has a past medical history of Anemia, Aortic insufficiency, Chest pain, H/O: GI bleed, Hyperlipidemia, Hypertension, Leg cramps, and Stroke (HCC).   Significant Hospital Events: Including procedures, antibiotic start and stop dates in addition to other pertinent events   8/25 admit for SBO 8/26 acute abdomen, tx to ICU for pressors. PICC line placed. Underwent Ex lap and small bowel resection 8/27  Extubated 8/27 Reintubated 9/1 9/1 bronchoscopy, BAL >>   Interim History / Subjective:  Progressive respiratory and mental status decline 9/1.  Discussion undertaken with patient's family by Dr. Merrily Pew and decision made to reintubate given concerns for chronic persistent aspiration as a cause for his decompensation Meropenem added 9/1 I/O+ 17.2 L total S Cr 1.71 < 1.63 < 1.40 Abdominal distention, tube feeding held this morning 9/2.  Remains on TPN Wound VAC changed this morning 9/2   Objective   Blood pressure 95/61, pulse 95, temperature 97.9 F (36.6 C), temperature source Axillary, resp. rate (!) 22, height 5\' 9"  (1.753 m), weight 106.9 kg, SpO2 100%.    Vent Mode: PRVC FiO2 (%):  [40 %-50 %] 40 % Set Rate:  [20 bmp-28 bmp] 28 bmp Vt Set:  [560 mL] 560 mL PEEP:  [5 cmH20] 5 cmH20 Plateau Pressure:  [17 cmH20-26 cmH20] 25 cmH20   Intake/Output Summary (Last 24 hours) at 03/05/2023 0857 Last data filed at 03/05/2023 0800 Gross per 24 hour  Intake 3492.16 ml  Output 1125 ml  Net 2367.16 ml   Filed Weights   03/02/23 0500 03/03/23 0500 03/04/23 0347  Weight: 102.4 kg 105.3 kg 106.9 kg    Examination: General: Critically ill-appearing thin man mechanically ventilated, comfortable HEENT: Oropharynx clear, ET tube in place, dry mucous membranes Neuro: Grimace with pain, opens eyes to voice.  Does not track, does not follow commands Chest: Scattered bilateral inspiratory crackles and rhonchi Heart: Irregularly irregular, distant, no murmur Abdomen: Wound VAC in place, nondistended, hypoactive bowel sounds Skin: No rash  Resolved Hospital Problem list     Assessment & Plan:  Small  bowel obstruction s/p ex lap and small bowel resection.  Acute abdomen -Appreciate CCS management -Following abdominal distention, TF held on 9/2 -Bowel regimen, has not had a bowel movement yet -Continue TPN -Wound VAC change today, appreciate CCS management  Acute respiratory failure with  hypoxia and hypercapnia secondary to aspiration pneumonia Sepsis with septic shock due to aspiration pneumonia Acute septic encephalopathy -Continue PRVC, 6 cc/kg -Empiric meropenem pending BAL culture data -Wean norepinephrine as able, remains on low-dose currently -Sedation as per PAD protocol, Precedex and fentanyl if needed -Given the persistent weakness, poor airway protection, will recommend early tracheostomy to family if plan of care is to continue aggressive support  Paroxysmal atrial fibrillation with rapid ventricular response Heart rate is controlled, remain in A-fib -Amiodarone as ordered -Eliquis as ordered  Chronic HFrEF Action fraction is 40 to 45% Moderate LVH -Follow I's/O on TPN, not in a position to push diuretics at this time given his hemodynamics  AKI, likely due to dehydration, sepsis -Follow BMP, urine output -Hold off on further diuresis at this time -Ensure adequate renal perfusion, avoid nephrotoxins and dose medications for renal function  Diabetes mellitus, controlled Hemoglobin A1c 6.5 -Continue CBG monitoring, SSI as per protocol  Baseline debility: Wheelchair-bound -Family desires full scope of care.  Certainly his baseline status will impact his ability to recover from this as well as his QOL going forward  Moderate malnutrition -Continue TPN -TF temporarily held, restart as per CCS recommendations -Appreciate dietitian assistance   Best Practice (right click and "Reselect all SmartList Selections" daily)   Diet/type: NPO  tube feeds held 9/2.  On TPN DVT prophylaxis: Apixaban GI prophylaxis: PPI Lines: Central line Foley:  Yes, and it is still needed Code Status:  full code Last date of multidisciplinary goals of care discussion [03/04/23: Please Ipal note, basically to proceed with aggressive care including endotracheal intubation]   Independent CC time: 33 minutes  Levy Pupa, MD, PhD 03/05/2023, 8:57 AM [place] [phone] or if no answer before 7:00PM call [phone] For any issues after 7:00PM please call eLink [phone]

## 2023-03-05 NOTE — Progress Notes (Signed)
[facility] Progress Note  7 Days Post-Op  Subjective: CC-  On the vent. TF at 40cc/hr. No bowel function.  Objective: Vital signs in last 24 hours: Temp:  [97.6 F (36.4 C)-98.6 F (37 C)] 97.9 F (36.6 C) (09/02 0821) Pulse Rate:  [59-236] 95 (09/02 0821) Resp:  [18-29] 22 (09/02 0821) BP: (75-172)/(12-101) 95/61 (09/02 0815) SpO2:  [95 %-100 %] 100 % (09/02 0821) FiO2 (%):  [40 %-50 %] 40 % (09/02 0725) Last BM Date :  (PTA)  Intake/Output from previous day: 09/01 0701 - 09/02 0700 In: 3431.7 [I.V.:2329.7; NG/GT:902; IV Piggyback:200] Out: 1125 [Urine:1125] Intake/Output this shift: Total I/O In: 135.5 [I.V.:95.5; NG/GT:40] Out: -   PE: Gen:  sedated on the vent HEENT: ETT, Cortrak present Card:  A fib, regular rate Pulm:  mechanically ventilated Abd: Soft, more distended, hypoactive bowel sounds, vac to midline with good seal and scant serosanguinous fluid in canister  Lab Results:  Recent Labs    03/04/23 0135 03/04/23 0901 03/05/23 0400  WBC 5.7  --  5.3  HGB 7.4* 10.5* 7.5*  HCT 23.1* 31.0* 22.2*  PLT 227  --  235   BMET Recent Labs    03/04/23 0135 03/04/23 0901 03/05/23 0400  NA 140 142 142  K 4.5 4.5 4.1  CL 110  --  114*  CO2 21*  --  21*  GLUCOSE 139*  --  181*  BUN 74*  --  87*  CREATININE 1.63*  --  1.71*  CALCIUM 9.1  --  9.0   PT/INR No results for input(s): "LABPROT", "INR" in the last 72 hours. CMP     Component Value Date/Time   NA 142 03/05/2023 0400   K 4.1 03/05/2023 0400   CL 114 (H) 03/05/2023 0400   CO2 21 (L) 03/05/2023 0400   GLUCOSE 181 (H) 03/05/2023 0400   BUN 87 (H) 03/05/2023 0400   CREATININE 1.71 (H) 03/05/2023 0400   CALCIUM 9.0 03/05/2023 0400   PROT 5.6 (L) 03/05/2023 0400   ALBUMIN 2.2 (L) 03/05/2023 0400   AST 11 (L) 03/05/2023 0400   ALT 14 03/05/2023 0400   ALKPHOS 35 (L) 03/05/2023 0400   BILITOT 0.4 03/05/2023 0400   GFRNONAA 37 (L) 03/05/2023 0400   GFRAA >60 05/16/2019 1110    Lipase     Component Value Date/Time   LIPASE 16 02/25/2023 1433       Studies/Results: DG CHEST PORT 1 VIEW  Result Date: 03/04/2023 CLINICAL DATA:  Acute respiratory failure. EXAM: PORTABLE CHEST 1 VIEW COMPARISON:  03/02/2023 FINDINGS: Endotracheal tube tip is 5.6 cm above the base of the carina. A feeding tube passes into the stomach although the distal tip position is not included on the film. Right PICC line tip overlies the innominate vein confluence. The cardio pericardial silhouette is enlarged. Low volume film with diffuse interstitial opacity. No right pleural effusion. Retrocardiac collapse/consolidation is similar to prior. Telemetry leads overlie the chest. IMPRESSION: 1. Low volume film with diffuse interstitial opacity, likely chronic but edema not excluded. 2. Retrocardiac collapse/consolidation, similar to prior. Electronically Signed   By: Kennith Center M.D.   On: 03/04/2023 09:22    Anti-infectives: Anti-infectives (From admission, onward)    Start     Dose/Rate Route Frequency Ordered Stop   03/04/23 1000  meropenem (MERREM) 1 g in sodium chloride 0.9 % 100 mL IVPB        1 g 200 mL/hr over 30 Minutes Intravenous Every 12 hours 03/04/23  9604 03/11/23 0959   03/01/23 1500  ceFEPIme (MAXIPIME) 2 g in sodium chloride 0.9 % 100 mL IVPB        2 g 200 mL/hr over 30 Minutes Intravenous Every 12 hours 03/01/23 1124 03/02/23 0356   03/01/23 0300  ceFEPIme (MAXIPIME) 2 g in sodium chloride 0.9 % 100 mL IVPB  Status:  Discontinued        2 g 200 mL/hr over 30 Minutes Intravenous Every 24 hours 02/28/23 1141 03/01/23 1124   02/26/23 1445  piperacillin-tazobactam (ZOSYN) IVPB 3.375 g  Status:  Discontinued        3.375 g 100 mL/hr over 30 Minutes Intravenous Every 8 hours 02/26/23 1436 02/26/23 1445   02/26/23 1445  ceFEPIme (MAXIPIME) 2 g in sodium chloride 0.9 % 100 mL IVPB  Status:  Discontinued        2 g 200 mL/hr over 30 Minutes Intravenous Every 12 hours 02/26/23  1446 02/28/23 1141   02/26/23 1445  metroNIDAZOLE (FLAGYL) IVPB 500 mg        500 mg 100 mL/hr over 60 Minutes Intravenous Every 12 hours 02/26/23 1446 03/02/23 1719   02/26/23 1015  meropenem (MERREM) 1 g in sodium chloride 0.9 % 100 mL IVPB        1 g 200 mL/hr over 30 Minutes Intravenous  Once 02/26/23 0917 02/26/23 1743        Assessment/Plan Small bowel obstruction involving a medial adhesion across the distal jejunum  -POD#7 s/p Exploratory laparotomy small bowel resection and primary anastomosis 8/26 Dr. Luisa Hart - approximately 120 cm of small bowel remaining  - Reintubated 9/1 - More distended today and no bowel function. Hold tube feedings. Check abdominal film, may need NG for decompression. Continue TPN - abdominal wound vac MWF    FEN - IVF, NPO, TPN VTE - eliquis ID - completed 5d postop. On merrem for possible aspiration pneumonia Foley - out 8/29 and voiding   AKI ABL anemia A fib RVR - On amiodarone drip, eliquis Wheelchair/walker bound HTN H/O CVA HLD Diastolic CHF DM    LOS: 8 days    Franne Forts, [facility] 03/05/2023, 8:47 AM Please see Amion for pager number during day hours 7:00am-4:30pm

## 2023-03-05 NOTE — Progress Notes (Addendum)
PHARMACY - TOTAL PARENTERAL NUTRITION CONSULT NOTE   Indication: Small bowel obstruction and Short bowel syndrome  Patient Measurements: Height: 5\' 9"  (175.3 cm) Weight: 106.9 kg (235 lb 10.8 oz) IBW/kg (Calculated) : 70.7   Body mass index is 34.8 kg/m. Usual Weight: 104 kg at admission  Assessment: 89 y/o M admitted 8/25 for abdominal pain after eating breakfast, found with small bowel obstruction. Exlap performed 8/26, patient in continuity however large amount of bowel resected with ~120 cm remaining. Pharmacy consulted to initiate TPN.  Glucose / Insulin: CBG 140 - 181, A1c 6.5; 12 units SSI over 24h Electrolytes: Na 142, K 4.1 (s/p furosemide 40 mg w/ increased UOP, worsening AKI, and decrease in TPN 9/1), 8/28 iCa 1.39, CoCa 10.4, Cl 114, CO2 21 Renal: Scr 1.71 (baseline Scr ~0.9), BUN 87 Hepatic: 9/2 AST/ALT wnl, Tbili 0.4, Albumin 2.2 Intake / Output; MIVF: Net +17.4L, 0.4 ml/kg/hr UOP. LBM 8/29 GI Imaging: 8/29 Abd [place]: mild, no abnormal dilatation 8/31 Abd [place]: nonobstructive bowel gas pattern 9/2 Abd [place]: no acute findings GI Surgeries / Procedures:  No procedures since initiation of TPN  Central access: PICC  TPN start date: 8/27  Nutritional Goals: Goal TPN rate is 75 mL/hr (provides 104.4 g of protein and 1832 kcals per day)  RD Assessment: Estimated Needs Total Energy Estimated Needs: 1800-2000 kcal/d Total Protein Estimated Needs: 90-105g/d Total Fluid Estimated Needs: 1.8L/d  Current Nutrition:  TPN, tube feeds are currently held per surgery  Plan:  Continue TPN at goal rate of 75 mL/hr at 1800 (provides 104.4 g protein, 1814.4 kcal), for 100% of estimated needs Electrolytes in TPN: Na 50 mEq/L, decrease K 20 mEq/L (total 36 mEq, serum level is above goal despite x1 lasix and increased UOP), Ca 0 mEq/L, Mg 5 mEq/L, and Phos 12 mmol/L. Maximize acetate Continue standard MVI and trace elements to TPN Continue Moderate q4h SSI and adjust as needed  Monitor  TPN labs on Mon/Thurs, BMP tomorrow AM Completed thiamine 100 mg IV x 5 days Tube feeds held with abdominal distension, f/u response to suppository Discussed w/ surgery, wean TPN once patient has bowel movement  Rutherford Nail, PharmD PGY2 Critical Care Pharmacy Resident 03/05/2023 9:45 AM

## 2023-03-05 NOTE — Consult Note (Signed)
WOC Nurse wound follow up Wound type:midline abdominal incision with NPWT (VAC) dressing.  Nonadherent to wound bed due to the presence of visible stitch in proximal end. Due to improved granulation, will stop Mepitel to wound bed.  Measurement: 13 cm x 3 cm x 2 cm  Wound bed: pale pink, some bleeding with care Drainage (amount, consistency, odor) minimal serosanguinous  Periwound: barrier ring over umbilicus and distal end to promote seal .  Abdomen is edematous, scrotum is edematous with foley in place.  Dressing procedure/placement/frequency: CLeanse with NS and pat dry.  1 piece black foam to wound bed.  Due to proximity to umbilicus, place 1/2 barrier ring over umbilicus and 1/2 ring at distal end. Cover with drape and change MOn.Wed.Fri.   Bedside RN to change dressing beginning Wednesday 03/07/23.  Will not follow at this time.  Please re-consult if needed.  Mike Gip MSN, RN, FNP-BC CWON Wound, Ostomy, Continence Nurse [facility] [phone] Pager [phone]

## 2023-03-06 DIAGNOSIS — K56609 Unspecified intestinal obstruction, unspecified as to partial versus complete obstruction: Secondary | ICD-10-CM | POA: Diagnosis not present

## 2023-03-06 DIAGNOSIS — J690 Pneumonitis due to inhalation of food and vomit: Secondary | ICD-10-CM | POA: Diagnosis not present

## 2023-03-06 DIAGNOSIS — J9601 Acute respiratory failure with hypoxia: Secondary | ICD-10-CM | POA: Diagnosis not present

## 2023-03-06 LAB — BASIC METABOLIC PANEL
Anion gap: 8 (ref 5–15)
BUN: 93 mg/dL — ABNORMAL HIGH (ref 8–23)
CO2: 21 mmol/L — ABNORMAL LOW (ref 22–32)
Calcium: 8.7 mg/dL — ABNORMAL LOW (ref 8.9–10.3)
Chloride: 110 mmol/L (ref 98–111)
Creatinine, Ser: 1.59 mg/dL — ABNORMAL HIGH (ref 0.61–1.24)
GFR, Estimated: 40 mL/min — ABNORMAL LOW (ref >60)
Glucose, Bld: 145 mg/dL — ABNORMAL HIGH (ref 70–99)
Potassium: 4.8 mmol/L (ref 3.5–5.1)
Sodium: 139 mmol/L (ref 135–145)

## 2023-03-06 LAB — CBC
HCT: 21.6 % — ABNORMAL LOW (ref 39.0–52.0)
Hemoglobin: 7.1 g/dL — ABNORMAL LOW (ref 13.0–17.0)
MCH: 30.7 pg (ref 26.0–34.0)
MCHC: 32.9 g/dL (ref 30.0–36.0)
MCV: 93.5 fL (ref 80.0–100.0)
Platelets: 227 10*3/uL (ref 150–400)
RBC: 2.31 MIL/uL — ABNORMAL LOW (ref 4.22–5.81)
RDW: 16.3 % — ABNORMAL HIGH (ref 11.5–15.5)
WBC: 7.9 10*3/uL (ref 4.0–10.5)
nRBC: 0.3 % — ABNORMAL HIGH (ref 0.0–0.2)

## 2023-03-06 LAB — GLUCOSE, CAPILLARY
Glucose-Capillary: 130 mg/dL — ABNORMAL HIGH (ref 70–99)
Glucose-Capillary: 122 mg/dL — ABNORMAL HIGH (ref 70–99)
Glucose-Capillary: 88 mg/dL (ref 70–99)
Glucose-Capillary: 130 mg/dL — ABNORMAL HIGH (ref 70–99)
Glucose-Capillary: 118 mg/dL — ABNORMAL HIGH (ref 70–99)
Glucose-Capillary: 149 mg/dL — ABNORMAL HIGH (ref 70–99)

## 2023-03-06 MED ORDER — FUROSEMIDE 10 MG/ML IJ SOLN
40.0000 mg | Freq: Once | INTRAMUSCULAR | Status: AC
  Administered 2023-03-06: 40 mg via INTRAVENOUS
  Filled 2023-03-06: qty 4

## 2023-03-06 MED ORDER — TRAVASOL 10 % IV SOLN
INTRAVENOUS | Status: AC
  Filled 2023-03-06: qty 1044

## 2023-03-06 MED ORDER — APIXABAN 2.5 MG PO TABS
2.5000 mg | ORAL_TABLET | Freq: Two times a day (BID) | ORAL | Status: DC
  Administered 2023-03-06: 2.5 mg
  Filled 2023-03-06: qty 1

## 2023-03-06 NOTE — Progress Notes (Signed)
[facility] Progress Note  8 Days Post-Op  Subjective: CC-  On the vent. Levo . Small BM with suppository yesterday, otherwise no bowel function.  Objective: Vital signs in last 24 hours: Temp:  [98.5 F (36.9 C)-100.3 F (37.9 C)] 99.5 F (37.5 C) (09/03 0748) Pulse Rate:  [84-132] 106 (09/03 0815) Resp:  [13-32] 15 (09/03 0815) BP: (79-134)/(56-101) 107/69 (09/03 0815) SpO2:  [86 %-100 %] 99 % (09/03 0815) FiO2 (%):  [40 %] 40 % (09/03 0800) Weight:  [110.5 kg] 110.5 kg (09/03 0500) Last BM Date : 03/05/23  Intake/Output from previous day: 09/02 0701 - 09/03 0700 In: 2425.2 [I.V.:2185.2; NG/GT:40; IV Piggyback:199.9] Out: 965 [Urine:965] Intake/Output this shift: Total I/O In: 90.2 [I.V.:90.2] Out: 150 [Urine:150]  PE: Gen: on the vent HEENT: ETT, Cortrak present Card:  A fib, mild tachycardia with rate low 100s Pulm:  mechanically ventilated Abd: Soft, mild distension, hypoactive bowel sounds, vac to midline with good seal and small amount of serosanguinous fluid in canister  Lab Results:  Recent Labs    03/05/23 0400 03/06/23 0314  WBC 5.3 7.9  HGB 7.5* 7.1*  HCT 22.2* 21.6*  PLT 235 227   BMET Recent Labs    03/05/23 0400 03/06/23 0314  NA 142 139  K 4.1 4.8  CL 114* 110  CO2 21* 21*  GLUCOSE 181* 145*  BUN 87* 93*  CREATININE 1.71* 1.59*  CALCIUM 9.0 8.7*   PT/INR No results for input(s): "LABPROT", "INR" in the last 72 hours. CMP     Component Value Date/Time   NA 139 03/06/2023 0314   K 4.8 03/06/2023 0314   CL 110 03/06/2023 0314   CO2 21 (L) 03/06/2023 0314   GLUCOSE 145 (H) 03/06/2023 0314   BUN 93 (H) 03/06/2023 0314   CREATININE 1.59 (H) 03/06/2023 0314   CALCIUM 8.7 (L) 03/06/2023 0314   PROT 5.6 (L) 03/05/2023 0400   ALBUMIN 2.2 (L) 03/05/2023 0400   AST 11 (L) 03/05/2023 0400   ALT 14 03/05/2023 0400   ALKPHOS 35 (L) 03/05/2023 0400   BILITOT 0.4 03/05/2023 0400   GFRNONAA 40 (L) 03/06/2023 0314    GFRAA >60 05/16/2019 1110   Lipase     Component Value Date/Time   LIPASE 16 02/25/2023 1433       Studies/Results: DG Abd Portable 1V  Result Date: 03/05/2023 CLINICAL DATA:  16109 Abdominal distension 60454 EXAM: PORTABLE ABDOMEN - 1 VIEW COMPARISON:  03/03/2023 FINDINGS: Feeding tube extends to the pylorus region. Stomach decompressed. Normal bowel gas pattern. No abnormal abdominal calcifications. Bilateral hip DJD. IMPRESSION: Feeding tube to the pylorus.  No acute findings. Electronically Signed   By: Corlis Leak M.D.   On: 03/05/2023 09:22   DG CHEST PORT 1 VIEW  Result Date: 03/04/2023 CLINICAL DATA:  Acute respiratory failure. EXAM: PORTABLE CHEST 1 VIEW COMPARISON:  03/02/2023 FINDINGS: Endotracheal tube tip is 5.6 cm above the base of the carina. A feeding tube passes into the stomach although the distal tip position is not included on the film. Right PICC line tip overlies the innominate vein confluence. The cardio pericardial silhouette is enlarged. Low volume film with diffuse interstitial opacity. No right pleural effusion. Retrocardiac collapse/consolidation is similar to prior. Telemetry leads overlie the chest. IMPRESSION: 1. Low volume film with diffuse interstitial opacity, likely chronic but edema not excluded. 2. Retrocardiac collapse/consolidation, similar to prior. Electronically Signed   By: Kennith Center M.D.   On: 03/04/2023 09:22    Anti-infectives:  Anti-infectives (From admission, onward)    Start     Dose/Rate Route Frequency Ordered Stop   03/04/23 1000  meropenem (MERREM) 1 g in sodium chloride 0.9 % 100 mL IVPB        1 g 200 mL/hr over 30 Minutes Intravenous Every 12 hours 03/04/23 0855 03/11/23 0959   03/01/23 1500  ceFEPIme (MAXIPIME) 2 g in sodium chloride 0.9 % 100 mL IVPB        2 g 200 mL/hr over 30 Minutes Intravenous Every 12 hours 03/01/23 1124 03/02/23 0356   03/01/23 0300  ceFEPIme (MAXIPIME) 2 g in sodium chloride 0.9 % 100 mL IVPB  Status:   Discontinued        2 g 200 mL/hr over 30 Minutes Intravenous Every 24 hours 02/28/23 1141 03/01/23 1124   02/26/23 1445  piperacillin-tazobactam (ZOSYN) IVPB 3.375 g  Status:  Discontinued        3.375 g 100 mL/hr over 30 Minutes Intravenous Every 8 hours 02/26/23 1436 02/26/23 1445   02/26/23 1445  ceFEPIme (MAXIPIME) 2 g in sodium chloride 0.9 % 100 mL IVPB  Status:  Discontinued        2 g 200 mL/hr over 30 Minutes Intravenous Every 12 hours 02/26/23 1446 02/28/23 1141   02/26/23 1445  metroNIDAZOLE (FLAGYL) IVPB 500 mg        500 mg 100 mL/hr over 60 Minutes Intravenous Every 12 hours 02/26/23 1446 03/02/23 1719   02/26/23 1015  meropenem (MERREM) 1 g in sodium chloride 0.9 % 100 mL IVPB        1 g 200 mL/hr over 30 Minutes Intravenous  Once 02/26/23 0917 02/26/23 1743        Assessment/Plan Small bowel obstruction involving a medial adhesion across the distal jejunum  -POD#8 s/p Exploratory laparotomy small bowel resection and primary anastomosis 8/26 Dr. Luisa Hart - approximately 120 cm of small bowel remaining  - Reintubated 9/1. On meropenem for aspiration pneumonia - Continue to hold tube feedings and await improved bowel function. Continue TPN - abdominal wound vac MWF    FEN - IVF, NPO, TPN VTE - eliquis ID - completed 5d postop. On merrem for aspiration pneumonia Foley - out 8/29 and voiding   AKI ABL anemia A fib RVR - On amiodarone drip, eliquis Wheelchair/walker bound HTN H/O CVA HLD Diastolic CHF DM    LOS: 9 days    Franne Forts, [facility] 03/06/2023, 8:33 AM Please see Amion for pager number during day hours 7:00am-4:30pm

## 2023-03-06 NOTE — Progress Notes (Signed)
[name], MRN:  [id number], DOB:  February 05, 1928, LOS: 9 ADMISSION DATE:  02/25/2023, CONSULTATION DATE: 8/26 REFERRING MD: Dr. Randol Kern, CHIEF COMPLAINT: Hypotension  History of Present Illness:  Patient is encephalopathic and/or intubated; therefore, history has been obtained from chart review.  89 year old male with past medical history as below, which is significant for atrial fibrillation on Eliquis, wheelchair-bound, HFpEF, and stroke who presented to [facility] emergency department on 8/25 with complaints of acute onset abdominal pain with no bowel movement for 3 days prior to presentation.  Also sounds like he had not been consistently taking his Eliquis.  Upon arrival to the emergency department the patient was noted to be in atrial fibrillation with RVR to the 120s.  Started on diltiazem infusion and placed on heparin drip.  CT of the abdomen and pelvis demonstrated small bowel obstruction and moderate ascites.  General surgery was consulted and recommended conservative management with NG tube decompression.  He was admitted to the hospitalist service.  In the a.m. hours of 8/26 the patient developed waning mental status and became nonverbal.  He also developed hypotension with systolic pressures in the 70s and desaturation to the 80s.  His abdomen became more firm and rigid and exquisitely tender to pain.  The surgical team was at bedside and determined the patient would likely require urgent surgery.  PCCM was consulted for hypotension and hypoxia.  Pertinent  Medical History   has a past medical history of Anemia, Aortic insufficiency, Chest pain, H/O: GI bleed, Hyperlipidemia, Hypertension, Leg cramps, and Stroke (HCC).   Significant Hospital Events: Including procedures, antibiotic start and stop dates in addition to other pertinent events   8/25 admit for SBO 8/26 acute abdomen, tx to ICU for pressors. PICC line placed. Underwent Ex lap and small bowel resection 8/27  Extubated 8/27 Reintubated 9/1 9/1 bronchoscopy, BAL >>   Interim History / Subjective:  Tolerated PSV yesterday 9/2 Precedex 0.4 Norepinephrine 2 S Cr 1.71 > 1.59 TF held on 9/2 I/O+ 18.5 L total   Objective   Blood pressure 105/80, pulse (!) 107, temperature 100.3 F (37.9 C), temperature source Axillary, resp. rate 20, height 5\' 9"  (1.753 m), weight 110.5 kg, SpO2 100%.    Vent Mode: PRVC FiO2 (%):  [40 %] 40 % Set Rate:  [14 bmp-28 bmp] 14 bmp Vt Set:  [560 mL] 560 mL PEEP:  [5 cmH20] 5 cmH20 Pressure Support:  [10 cmH20-12 cmH20] 10 cmH20 Plateau Pressure:  [15 cmH20-25 cmH20] 15 cmH20   Intake/Output Summary (Last 24 hours) at 03/06/2023 0721 Last data filed at 03/06/2023 0700 Gross per 24 hour  Intake 2425.17 ml  Output 965 ml  Net 1460.17 ml   Filed Weights   03/03/23 0500 03/04/23 0347 03/06/23 0500  Weight: 105.3 kg 106.9 kg 110.5 kg    Examination: General: Thin elderly man, no distress, ventilated HEENT: ET tube in place, oropharynx otherwise clear, dry mucous membranes Neuro: Opens eyes, some grimace with stimulation, does not follow commands Chest: Scattered bilateral inspiratory crackles, no wheeze Heart: Irregularly irregular, distant, no murmur Abdomen: Wound VAC in place, nondistended with hypoactive bowel sounds Skin: No rash  Resolved Hospital Problem list     Assessment & Plan:  Small bowel obstruction s/p ex lap and small bowel resection.  Acute abdomen -Appreciate CCS management -TF held on 9/2, following abdominal distention and for bowel movement on aggressive bowel regimen -Continue TPN for now -Wound VAC changes as per CCS plans  Acute respiratory failure with hypoxia and hypercapnia secondary to aspiration pneumonia Sepsis with septic shock due to aspiration pneumonia Acute septic encephalopathy -Continue PRVC, lung protective ventilator strategy -Okay for PSV as he can tolerate.  Largest barrier to extubation/progress appeared to  be secretion management and airway protection, cough strength. -Discussed with family 9/2 the potential impact, benefits of early tracheostomy if we are to continue aggressive support.  They are thinking about this -Continue empiric meropenem for presumed aspiration pneumonia.  Follow BAL culture results -Sedation as per PAD protocol, Precedex and intermittent Dilaudid -Wean norepinephrine as able  Paroxysmal atrial fibrillation with rapid ventricular response Heart rate is controlled, remain in A-fib -Continue amiodarone and Eliquis as ordered  Chronic HFrEF Action fraction is 40 to 45% Moderate LVH -Follow I's/O, would like to push diuresis if his renal function and hemodynamics will tolerate.  Single dose Lasix 40 today 9/3  AKI, likely due to dehydration, sepsis -Following BMP, urine output -Lasix x 1 on 9/3 -Renal dose medications, ensure adequate renal perfusion  Diabetes mellitus, controlled Hemoglobin A1c 6.5 -CBG monitoring and sliding scale insulin as per protocol -CBG goal <180  Baseline debility: Wheelchair-bound -Family desires full scope of care.  Certainly his baseline status will impact his ability to recover from this as well as his QOL going forward  Moderate malnutrition -Continue TPN -TF temporarily held, restart as per CCS recommendations -Appreciate dietitian assistance   Best Practice (right click and "Reselect all SmartList Selections" daily)   Diet/type: NPO  tube feeds held 9/2.  On TPN DVT prophylaxis: Apixaban GI prophylaxis: PPI Lines: Central line Foley:  Yes, and it is still needed Code Status:  full code Last date of multidisciplinary goals of care discussion [03/04/23: Please Ipal note, basically to proceed with aggressive care including endotracheal intubation.  Revisited CODE STATUS on 9/2, specifically with regard to ACLS or CPR if he were to decline.  Family is contemplating this]  Independent CC time: 25 minutes  Levy Pupa, MD,  PhD 03/06/2023, 7:21 AM [facility] [phone] or if no answer before 7:00PM call [phone] For any issues after 7:00PM please call eLink [phone]

## 2023-03-06 NOTE — Progress Notes (Signed)
PHARMACY - TOTAL PARENTERAL NUTRITION CONSULT NOTE   Indication: Small bowel obstruction and Short bowel syndrome  Patient Measurements: Height: 5\' 9"  (175.3 cm) Weight: 110.5 kg (243 lb 9.7 oz) IBW/kg (Calculated) : 70.7   Body mass index is 35.97 kg/m. Usual Weight: 104 kg at admission  Assessment: 89 y/o M admitted 8/25 for abdominal pain after eating breakfast, found with small bowel obstruction. Exlap performed 8/26, patient in continuity however large amount of bowel resected with ~120 cm remaining. Pharmacy consulted to initiate TPN.  Glucose / Insulin: CBG 120-160, A1c 6.5; 15 units SSI over 24h Electrolytes: Na 139, K 4.8, CoCa 10.1, Cl 110, CO2 21 Renal: Scr 1.59 (baseline Scr ~0.9), BUN 93 Hepatic: 9/2 AST/ALT wnl, Tbili 0.4, Albumin 2.2 Intake / Output; MIVF: UOP 0.4 mL/kg/hr, LBM 9/2 (Type 7) GI Imaging: 8/29 Abd [place]: mild, no abnormal dilatation 8/31 Abd [place]: nonobstructive bowel gas pattern 9/2 Abd [place]: no acute findings GI Surgeries / Procedures:  No procedures since initiation of TPN  Central access: PICC  TPN start date: 8/27  Nutritional Goals: Goal TPN rate is 75 mL/hr (provides 104.4 g of protein and 1832 kcals per day)  RD Assessment: Estimated Needs Total Energy Estimated Needs: 1800-2000 kcal/d Total Protein Estimated Needs: 90-105g/d Total Fluid Estimated Needs: 1.8L/d  Current Nutrition:  TPN, tube feeds are currently held per surgery  Plan:  Continue TPN at goal rate of 75 mL/hr at 1800 (provides 104.4 g protein, 1814.4 kcal), for 100% of estimated needs Electrolytes in TPN: Na 50 mEq/L, decr K to 5 mEq/L, Ca 0 mEq/L, Mg 5 mEq/L, and Phos 12 mmol/L. Maximize acetate Continue standard MVI and trace elements to TPN Continue Moderate q4h SSI and adjust as needed  Monitor TPN labs on Mon/Thurs, BMP tomorrow AM Completed thiamine 100 mg IV x 5 days F/u restarting TF pending good bowel movement Consider concentrating TPN pending diuresis  plans  Eldridge Scot, PharmD, BCCCP Clinical Pharmacist 03/06/2023, 9:09 AM

## 2023-03-06 NOTE — Progress Notes (Signed)
Nutrition Follow-up  DOCUMENTATION CODES:   Non-severe (moderate) malnutrition in context of chronic illness  INTERVENTION:  - Plan to continue TPN at this time. Remains at goal of 38mL/hr, meeting 100% of estimated needs.  -  TPN, management per pharmacy - Due to the length of remaining bowel pt is at high risk for short bowel syndrome and will likely require TPN for the remainder of his life.  - Plan to continue holding tube feeding at this time.  - Once able to restart tube feeds, recommend trickle feeds of Osmolite 1.5  - Goal TF: Osmolite 1.5 at 50 ml/h (1200 ml per day) - Once able to advance, recommend advancing by 10mL q12h to goal. - Prosource TF20 60 ml 1x/d - Provides 1880 kcal, 95 gm protein, 914 ml free water daily   NUTRITION DIAGNOSIS:   Moderate Malnutrition related to chronic illness as evidenced by mild fat depletion, severe muscle depletion. *ongoing  GOAL:   Patient will meet greater than or equal to 90% of their needs *met with TPN  MONITOR:   I & O's, Vent status, Labs, Weight trends  REASON FOR ASSESSMENT:   Consult Enteral/tube feeding initiation and management (trickles)  ASSESSMENT:   Pt with hx of HTN, HLD, CVA, CHF, DM type 2, atrial fibrillation, and AAA presented to ED with abdominal pain. Imaging showed a SBO. Initially treated conservatively but ultimately developed hypotension and was transferred to ICU and then taken to OR for repair.  8/25 - admitted with SBO 8/26 - Op, exploratory laparotomy with bowel resection, Small bowel was resected from the distal jejunum to the distal ileum. ~120cm of remaining bowel. PICC placed 8/27 - TPN initiated, extubated 8/28 - wound vac placed 8/30 - cortrak placed (extreme distal stomach or proximal duodenum); TF started at trickle rate 8/31 - TF increased to 21mL/hr 9/1 - Re-intubated; TF increased to 61mL/hr 9/2 - TF held due to distension and no bowel function  TF remains held at this time per  surgery. Plan to await improved bowel function.   Per RN on rounds, patient had a bowel movement this AM.  TPN infusing at goal rate of 68mL/h (provides 104.4 g of protein and 1814 kcals per day). Pt has <200cm bowel remaining which puts him at risk for short gut syndrome. Pt is at high risk for requiring TPN for the remainder of his life. Will have to carefully monitor when attempting to cut back on TPN infusion.    Son at bedside. Discussed TPN and plan to await return of bowel function prior to restarting tube feeds.     Admit weight: 229# Current weight: 243# I&O's: +17.9L since admit +Generalized edema, deep pitting  Medications reviewed and include: Colace, Miralax Precedex Levophed @ 80mcg/min  Labs reviewed:  Creatinine 1.59 HA1C 6.5 Blood Glucose 122-161 x24 hours Triglycerides 35 (as of 9/2)   Diet Order:   Diet Order             Diet NPO time specified  Diet effective now                   EDUCATION NEEDS:  Not appropriate for education at this time  Skin:  Skin Assessment: Reviewed RN Assessment (surgical incision, midline)  Last BM:  9/3  Height:  Ht Readings from Last 1 Encounters:  03/04/23 5\' 9"  (1.753 m)   Weight:  Wt Readings from Last 1 Encounters:  03/06/23 110.5 kg   Ideal Body Weight:  72.7 kg  BMI:  Body mass index is 35.97 kg/m.  Estimated Nutritional Needs:  Kcal:  1800-2000 kcal/d Protein:  90-105g/d Fluid:  1.8L/d    Shelle Iron RD, LDN For contact information, refer to [facility].

## 2023-03-07 ENCOUNTER — Inpatient Hospital Stay (HOSPITAL_COMMUNITY): Payer: 59

## 2023-03-07 DIAGNOSIS — R609 Edema, unspecified: Secondary | ICD-10-CM | POA: Diagnosis not present

## 2023-03-07 DIAGNOSIS — J9601 Acute respiratory failure with hypoxia: Secondary | ICD-10-CM

## 2023-03-07 DIAGNOSIS — J9602 Acute respiratory failure with hypercapnia: Secondary | ICD-10-CM | POA: Diagnosis not present

## 2023-03-07 LAB — BASIC METABOLIC PANEL
Anion gap: 6 (ref 5–15)
BUN: 90 mg/dL — ABNORMAL HIGH (ref 8–23)
CO2: 25 mmol/L (ref 22–32)
Calcium: 8.8 mg/dL — ABNORMAL LOW (ref 8.9–10.3)
Chloride: 112 mmol/L — ABNORMAL HIGH (ref 98–111)
Creatinine, Ser: 1.28 mg/dL — ABNORMAL HIGH (ref 0.61–1.24)
GFR, Estimated: 52 mL/min — ABNORMAL LOW (ref >60)
Glucose, Bld: 144 mg/dL — ABNORMAL HIGH (ref 70–99)
Potassium: 4.5 mmol/L (ref 3.5–5.1)
Sodium: 143 mmol/L (ref 135–145)

## 2023-03-07 LAB — CBC
HCT: 19.9 % — ABNORMAL LOW (ref 39.0–52.0)
HCT: 21.1 % — ABNORMAL LOW (ref 39.0–52.0)
Hemoglobin: 6.9 g/dL — CL (ref 13.0–17.0)
Hemoglobin: 7 g/dL — ABNORMAL LOW (ref 13.0–17.0)
MCH: 32.2 pg (ref 26.0–34.0)
MCH: 30.8 pg (ref 26.0–34.0)
MCHC: 34.7 g/dL (ref 30.0–36.0)
MCHC: 33.2 g/dL (ref 30.0–36.0)
MCV: 93 fL (ref 80.0–100.0)
MCV: 93 fL (ref 80.0–100.0)
Platelets: 210 10*3/uL (ref 150–400)
Platelets: 219 10*3/uL (ref 150–400)
RBC: 2.14 MIL/uL — ABNORMAL LOW (ref 4.22–5.81)
RBC: 2.27 MIL/uL — ABNORMAL LOW (ref 4.22–5.81)
RDW: 16.5 % — ABNORMAL HIGH (ref 11.5–15.5)
RDW: 16 % — ABNORMAL HIGH (ref 11.5–15.5)
WBC: 5.9 10*3/uL (ref 4.0–10.5)
WBC: 5.9 10*3/uL (ref 4.0–10.5)
nRBC: 0 % (ref 0.0–0.2)
nRBC: 0 % (ref 0.0–0.2)

## 2023-03-07 LAB — GLUCOSE, CAPILLARY
Glucose-Capillary: 128 mg/dL — ABNORMAL HIGH (ref 70–99)
Glucose-Capillary: 133 mg/dL — ABNORMAL HIGH (ref 70–99)
Glucose-Capillary: 134 mg/dL — ABNORMAL HIGH (ref 70–99)
Glucose-Capillary: 118 mg/dL — ABNORMAL HIGH (ref 70–99)
Glucose-Capillary: 127 mg/dL — ABNORMAL HIGH (ref 70–99)
Glucose-Capillary: 131 mg/dL — ABNORMAL HIGH (ref 70–99)

## 2023-03-07 LAB — MAGNESIUM: Magnesium: 2.3 mg/dL (ref 1.7–2.4)

## 2023-03-07 MED ORDER — OSMOLITE 1.5 CAL PO LIQD
1000.0000 mL | ORAL | Status: DC
  Administered 2023-03-07: 1000 mL
  Filled 2023-03-07: qty 1000

## 2023-03-07 MED ORDER — TRAVASOL 10 % IV SOLN
INTRAVENOUS | Status: AC
  Filled 2023-03-07: qty 1044

## 2023-03-07 MED ORDER — APIXABAN 5 MG PO TABS
5.0000 mg | ORAL_TABLET | Freq: Two times a day (BID) | ORAL | Status: DC
  Administered 2023-03-07 (×2): 5 mg
  Filled 2023-03-07 (×2): qty 1

## 2023-03-07 NOTE — Progress Notes (Signed)
Upper extremity venous duplex completed. Please see CV Procedures for preliminary results.  Shona Simpson, RVT 03/07/23 2:32 PM

## 2023-03-07 NOTE — Consult Note (Addendum)
WOC Nurse wound follow up Wound type: full thickness surgical  Measurement: 13 cm x 3 cm x 1.5 cm Wound bed: pink moist  Drainage (amount, consistency, odor) minimal serosanguinous  Periwound: intact, cut and flattened 1/2 of a barrier ring to cover dip in umbilical area  Dressing procedure/placement/frequency: worked with Lequita Halt bedside RN to perform.   Removed old NPWT dressing Cleansed wound with normal saline Filled wound with 1 piece of black foam  Sealed NPWT dressing at HG Patient received IV  pain medication per bedside nurse prior to dressing change Patient tolerated procedure well  Ok for bedside nurse to change non-complex NPWT dressing M/W/F  as per previous order.  2 medium vac kits ordered for room.   Thank you,    Priscella Mann MSN, RN-BC, [employer] [phone]

## 2023-03-07 NOTE — Progress Notes (Signed)
PHARMACY - TOTAL PARENTERAL NUTRITION CONSULT NOTE   Indication: Small bowel obstruction and Short bowel syndrome  Patient Measurements: Height: 5\' 9"  (175.3 cm) Weight: 109 kg (240 lb 4.8 oz) IBW/kg (Calculated) : 70.7   Body mass index is 35.49 kg/m. Usual Weight: 104 kg at admission  Assessment: 89 y/o M admitted 8/25 for abdominal pain after eating breakfast, found with small bowel obstruction. Exlap performed 8/26, patient in continuity however large amount of bowel resected with ~120 cm remaining. Pharmacy consulted to initiate TPN.  Glucose / Insulin: CBG 88-149, A1c 6.5; 6 units SSI over 24h Electrolytes: Na 143, K 4.5, CoCa 10.2, Cl 112, CO2 25 Renal: Scr 1.28 (baseline Scr ~0.9), BUN 90 Hepatic: 9/2 AST/ALT wnl, Tbili 0.4, Albumin 2.2 Intake / Output; MIVF: UOP 1.1 mL/kg/hr, LBM 9/3 (Type 6) GI Imaging: 8/29 Abd [place]: mild, no abnormal dilatation 8/31 Abd [place]: nonobstructive bowel gas pattern 9/2 Abd [place]: no acute findings GI Surgeries / Procedures:  No procedures since initiation of TPN  Central access: PICC  TPN start date: 8/27  Nutritional Goals: Goal TPN rate is 75 mL/hr (provides 104.4 g of protein and 1832 kcals per day)  RD Assessment: Estimated Needs Total Energy Estimated Needs: 1800-2000 kcal/d Total Protein Estimated Needs: 90-105g/d Total Fluid Estimated Needs: 1.8L/d  Current Nutrition:  TPN 9/4: Re-trial trickle TF  Plan:  Continue TPN at goal rate of 75 mL/hr at 1800 (provides 104.4 g protein, 1814.4 kcal), for 100% of estimated needs Electrolytes in TPN: Na 50 mEq/L, K 5 mEq/L, Ca 0 mEq/L, Mg 5 mEq/L, and Phos 12 mmol/L. Maximize acetate Continue standard MVI and trace elements to TPN Continue Moderate q4h SSI and adjust as needed  Monitor TPN labs on Mon/Thurs, and as needed Completed thiamine 100 mg IV x 5 days F/u if able to tolerate trickle TF - may need to concentrate TPN if unable to escalate TF within reasonable timeframe  Eldridge Scot,  PharmD, BCCCP Clinical Pharmacist 03/07/2023, 9:42 AM

## 2023-03-07 NOTE — Progress Notes (Signed)
eLink Physician-Brief Progress Note Patient Name: Stephen Flynn DOB: 03-Sep-1927 MRN: [id number]   Date of Service  03/07/2023  HPI/Events of Note  89 year old male who presented to the ICU after small bowel obstruction and ex lap with small bowel resection complicated by acute hypoxic respiratory failure and shock thought to be secondary to sepsis from aspiration pneumonia.  Currently on 1 mcg of norepinephrine peripherally.  Hemoglobin downtrend from 7.1 -> 6.9 over 24 hours.  eICU Interventions  Continue observation.     Intervention Category Intermediate Interventions: Bleeding - evaluation and treatment with blood products  Nikitta Sobiech 03/07/2023, 5:07 AM

## 2023-03-07 NOTE — Progress Notes (Signed)
PT Cancellation Note  Patient Details Name: Stephen Flynn MRN: [id number] DOB: February 20, 1928   Cancelled Treatment:    Reason Eval/Treat Not Completed: Medical issues which prohibited therapy  Pt with doppler to r/o DVT UE pending. Additionally, still in ICU and intubated.  Will f/u after results.   Anise Salvo, PT Acute Rehab [facility] [phone]  Rayetta Humphrey 03/07/2023, 3:54 PM

## 2023-03-07 NOTE — Progress Notes (Signed)
NAMEAnselm Flynn, MRN:  [id number], DOB:  1927/10/26, LOS: 10 ADMISSION DATE:  02/25/2023, CONSULTATION DATE: 8/26 REFERRING MD: Dr. Randol Kern, CHIEF COMPLAINT: Hypotension  History of Present Illness:  Patient is encephalopathic and/or intubated; therefore, history has been obtained from chart review.  89 year old male with past medical history as below, which is significant for atrial fibrillation on Eliquis, wheelchair-bound, HFpEF, and stroke who presented to [facility] emergency department on 8/25 with complaints of acute onset abdominal pain with no bowel movement for 3 days prior to presentation.  Also sounds like he had not been consistently taking his Eliquis.  Upon arrival to the emergency department the patient was noted to be in atrial fibrillation with RVR to the 120s.  Started on diltiazem infusion and placed on heparin drip.  CT of the abdomen and pelvis demonstrated small bowel obstruction and moderate ascites.  General surgery was consulted and recommended conservative management with NG tube decompression.  He was admitted to the hospitalist service.  In the a.m. hours of 8/26 the patient developed waning mental status and became nonverbal.  He also developed hypotension with systolic pressures in the 70s and desaturation to the 80s.  His abdomen became more firm and rigid and exquisitely tender to pain.  The surgical team was at bedside and determined the patient would likely require urgent surgery.  PCCM was consulted for hypotension and hypoxia.  Pertinent  Medical History   has a past medical history of Anemia, Aortic insufficiency, Chest pain, H/O: GI bleed, Hyperlipidemia, Hypertension, Leg cramps, and Stroke (HCC).   Significant Hospital Events: Including procedures, antibiotic start and stop dates in addition to other pertinent events   8/25 admit for SBO 8/26 acute abdomen, tx to ICU for pressors. PICC line placed. Underwent Ex lap and small bowel  resection 8/27 Extubated 8/27 Reintubated 9/1 9/1 bronchoscopy, BAL >>   Interim History / Subjective:  He had a non-bloody bowel movement yesterday.  Tolerated PSV 9/3  Precedex 0.3 Norepinephrine 1  S Cr improved 1.59 > 1.28   Objective   Blood pressure 91/66, pulse 90, temperature 97.6 F (36.4 C), temperature source Oral, resp. rate 13, height 5\' 9"  (1.753 m), weight 109 kg, SpO2 97%.    Vent Mode: PSV;CPAP FiO2 (%):  [30 %-40 %] 30 % Set Rate:  [14 bmp] 14 bmp Vt Set:  [560 mL] 560 mL PEEP:  [5 cmH20] 5 cmH20 Pressure Support:  [8 cmH20] 8 cmH20 Plateau Pressure:  [17 cmH20] 17 cmH20   Intake/Output Summary (Last 24 hours) at 03/07/2023 1016 Last data filed at 03/07/2023 0900 Gross per 24 hour  Intake 1952.2 ml  Output 2325 ml  Net -372.8 ml   Filed Weights   03/04/23 0347 03/06/23 0500 03/07/23 0425  Weight: 106.9 kg 110.5 kg 109 kg    Examination: General: Thin elderly man, no distress, ventilated HEENT: ET tube in place, oropharynx otherwise clear, dry mucous membranes Neuro: Opens eyes, some grimace with stimulation and abdomen palpation, does not follow commands Chest: Scattered bilateral inspiratory crackles, no wheeze Heart: Irregularly irregular, distant, no murmur Abdomen: Wound VAC in place, nondistended with hypoactive bowel sounds MSK: Left upper extremity edema Skin: No rash. Two blisters on left arm   Resolved Hospital Problem list     Assessment & Plan:  Small bowel obstruction s/p ex lap and small bowel resection.  Acute abdomen -Appreciate CCS management -TF held on 9/2 due to abdominal distension and no bowel function, which  has now returned  -Continue TPN for now  -Wound VAC changes as per CCS plans  Acute respiratory failure with hypoxia and hypercapnia secondary to aspiration pneumonia Sepsis with septic shock due to aspiration pneumonia Acute septic encephalopathy CXR notable for persistent consolidation or atelectasis in left lower  lobe, stable left sided pleural effusion.  -Continue PSV as tolerated. Largest barrier to extubation/progress appeared to be secretion management and airway protection, cough strength  -Follow up with family about potential tracheostomy  -Continue empiric meropenem for presumed aspiration pneumonia.  Follow BAL culture results -Sedation as per PAD protocol, Precedex and intermittent Dilaudid -Wean norepinephrine as able  Anemia  Hgb borderline at 7.0 without evidence of bleeding. Has a history of iron deficiency anemia.  -May need a transfusion  -Follow CBC, type and screen ordered   Paroxysmal atrial fibrillation with rapid ventricular response Heart rate is controlled, remain in A-fib -Continue amiodarone and Eliquis as ordered  Chronic HFrEF Ejection fraction is 40 to 45% Moderate LVH -Follow I's/O, exam not notable for fluid overload today so holding diuresis   AKI, likely due to dehydration, sepsis -Following BMP, urine output -Renal dose medications, ensure adequate renal perfusion  Diabetes mellitus, controlled Hemoglobin A1c 6.5 -CBG monitoring and sliding scale insulin as per protocol -CBG goal <180  Baseline debility: Wheelchair-bound -Family desires full scope of care.  Certainly his baseline status will impact his ability to recover from this as well as his QOL going forward  Moderate malnutrition -Continue TPN -TF temporarily held, restart as per CCS recommendations -Appreciate dietitian assistance   Best Practice (right click and "Reselect all SmartList Selections" daily)   Diet/type: NPO  tube feeds held 9/2.  On TPN DVT prophylaxis: Apixaban GI prophylaxis: PPI Lines: Central line Foley:  Yes, and it is still needed Code Status:  full code Last date of multidisciplinary goals of care discussion [03/04/23: Please Ipal note, basically to proceed with aggressive care including endotracheal intubation.  Revisited CODE STATUS on 9/2, specifically with regard to  ACLS or CPR if he were to decline.  Family is contemplating this]  Tempie Hoist, MS4

## 2023-03-07 NOTE — Progress Notes (Signed)
OT Cancellation Note  Patient Details Name: Wick Mosely MRN: [id number] DOB: 1928-01-28   Cancelled Treatment:    Reason Eval/Treat Not Completed: Medical issues which prohibited therapy. Pt with doppler to r/o UE DVT results pending. Will follow up after results.   Tyler Deis, OTR/L [facility] Acute Rehabilitation Office: [phone]   Myrla Halsted 03/07/2023, 4:16 PM

## 2023-03-07 NOTE — Progress Notes (Signed)
[facility] Progress Note  9 Days Post-Op  Subjective: CC-  Alert on the vent. Levo turned off this AM. Large nonbloody BM yesterday.  Objective: Vital signs in last 24 hours: Temp:  [97.6 F (36.4 C)-99.1 F (37.3 C)] 97.6 F (36.4 C) (09/04 0831) Pulse Rate:  [65-118] 78 (09/04 0744) Resp:  [11-22] 19 (09/04 0744) BP: (86-149)/(60-109) 120/82 (09/04 0744) SpO2:  [96 %-100 %] 100 % (09/04 0744) FiO2 (%):  [30 %-40 %] 30 % (09/04 0744) Weight:  [962 kg] 109 kg (09/04 0425) Last BM Date : 03/06/23  Intake/Output from previous day: 09/03 0701 - 09/04 0700 In: 2124.1 [I.V.:1924.1; IV Piggyback:200.1] Out: 2925 [Urine:2875; Drains:50] Intake/Output this shift: Total I/O In: 86.8 [I.V.:86.8] Out: -   Vent: Vent Mode: PSV;CPAP FiO2 (%):  [30 %-40 %] 30 % Set Rate:  [14 bmp] 14 bmp Vt Set:  [560 mL] 560 mL PEEP:  [5 cmH20] 5 cmH20 Pressure Support:  [8 cmH20] 8 cmH20 Plateau Pressure:  [17 cmH20] 17 cmH20    PE: Gen: Alert, NAD, on the vent HEENT: ETT, Cortrak present and clamped Card:  A fib, regular rate Pulm:  mechanically ventilated Abd: Soft, mild distension, hypoactive bowel sounds, vac to midline with good seal and small amount of serosanguinous fluid in canister  Lab Results:  Recent Labs    03/07/23 0436 03/07/23 0512  WBC 5.9 5.9  HGB 6.9* 7.0*  HCT 19.9* 21.1*  PLT 210 219   BMET Recent Labs    03/06/23 0314 03/07/23 0512  NA 139 143  K 4.8 4.5  CL 110 112*  CO2 21* 25  GLUCOSE 145* 144*  BUN 93* 90*  CREATININE 1.59* 1.28*  CALCIUM 8.7* 8.8*   PT/INR No results for input(s): "LABPROT", "INR" in the last 72 hours. CMP     Component Value Date/Time   NA 143 03/07/2023 0512   K 4.5 03/07/2023 0512   CL 112 (H) 03/07/2023 0512   CO2 25 03/07/2023 0512   GLUCOSE 144 (H) 03/07/2023 0512   BUN 90 (H) 03/07/2023 0512   CREATININE 1.28 (H) 03/07/2023 0512   CALCIUM 8.8 (L) 03/07/2023 0512   PROT 5.6 (L) 03/05/2023 0400    ALBUMIN 2.2 (L) 03/05/2023 0400   AST 11 (L) 03/05/2023 0400   ALT 14 03/05/2023 0400   ALKPHOS 35 (L) 03/05/2023 0400   BILITOT 0.4 03/05/2023 0400   GFRNONAA 52 (L) 03/07/2023 0512   GFRAA >60 05/16/2019 1110   Lipase     Component Value Date/Time   LIPASE 16 02/25/2023 1433       Studies/Results: DG Chest Port 1 View  Result Date: 03/07/2023 CLINICAL DATA:  95284 with respiratory failure. EXAM: PORTABLE CHEST 1 VIEW COMPARISON:  Portable chest 03/04/2023 FINDINGS: 4:27 a.m. ETT tip is 2.6 cm from the carina. Feeding tube is well inside the stomach extending toward the gastric antrum but the tip is not seen. Right PICC terminates in the upper SVC. Stable cardiomegaly. Central vascular prominence noted without overt edema. There are low lung volumes. Continued consolidation or atelectasis in the left lower lobe. The lungs are otherwise clear. Small left pleural effusion also unchanged. Stable mediastinum with calcification in the aorta and tortuosity. No new osseous findings. Compare: Overall aeration seems unchanged. IMPRESSION: No significant interval change. Persistent consolidation or atelectasis in the left lower lobe. Stable small left pleural effusion. Stable cardiomegaly and central vascular prominence. Aortic atherosclerosis. Electronically Signed   By: Earlean Shawl.D.  On: 03/07/2023 07:03   DG Abd Portable 1V  Result Date: 03/05/2023 CLINICAL DATA:  95621 Abdominal distension [id number] EXAM: PORTABLE ABDOMEN - 1 VIEW COMPARISON:  03/03/2023 FINDINGS: Feeding tube extends to the pylorus region. Stomach decompressed. Normal bowel gas pattern. No abnormal abdominal calcifications. Bilateral hip DJD. IMPRESSION: Feeding tube to the pylorus.  No acute findings. Electronically Signed   By: Corlis Leak M.D.   On: 03/05/2023 09:22    Anti-infectives: Anti-infectives (From admission, onward)    Start     Dose/Rate Route Frequency Ordered Stop   03/04/23 1000  meropenem (MERREM) 1 g in  sodium chloride 0.9 % 100 mL IVPB        1 g 200 mL/hr over 30 Minutes Intravenous Every 12 hours 03/04/23 0855 03/11/23 0959   03/01/23 1500  ceFEPIme (MAXIPIME) 2 g in sodium chloride 0.9 % 100 mL IVPB        2 g 200 mL/hr over 30 Minutes Intravenous Every 12 hours 03/01/23 1124 03/02/23 0356   03/01/23 0300  ceFEPIme (MAXIPIME) 2 g in sodium chloride 0.9 % 100 mL IVPB  Status:  Discontinued        2 g 200 mL/hr over 30 Minutes Intravenous Every 24 hours 02/28/23 1141 03/01/23 1124   02/26/23 1445  piperacillin-tazobactam (ZOSYN) IVPB 3.375 g  Status:  Discontinued        3.375 g 100 mL/hr over 30 Minutes Intravenous Every 8 hours 02/26/23 1436 02/26/23 1445   02/26/23 1445  ceFEPIme (MAXIPIME) 2 g in sodium chloride 0.9 % 100 mL IVPB  Status:  Discontinued        2 g 200 mL/hr over 30 Minutes Intravenous Every 12 hours 02/26/23 1446 02/28/23 1141   02/26/23 1445  metroNIDAZOLE (FLAGYL) IVPB 500 mg        500 mg 100 mL/hr over 60 Minutes Intravenous Every 12 hours 02/26/23 1446 03/02/23 1719   02/26/23 1015  meropenem (MERREM) 1 g in sodium chloride 0.9 % 100 mL IVPB        1 g 200 mL/hr over 30 Minutes Intravenous  Once 02/26/23 0917 02/26/23 1743        Assessment/Plan Small bowel obstruction involving a medial adhesion across the distal jejunum  -POD#9 s/p Exploratory laparotomy small bowel resection and primary anastomosis 8/26 Dr. Luisa Hart - approximately 120 cm of small bowel remaining  - Reintubated 9/1. On meropenem for aspiration pneumonia.  - Restart trickle tube feedings today. Continue TPN - abdominal wound vac MWF    FEN - IVF, NPO, TPN, TF at 20cc/hr VTE - eliquis ID - completed 5d cefepime/flagyl postop on 8/30. Now on merrem for aspiration pneumonia Foley - present   AKI ABL anemia A fib RVR - On amiodarone, eliquis Wheelchair/walker bound HTN H/O CVA HLD Diastolic CHF DM      LOS: 10 days    Franne Forts, [facility]  Surgery 03/07/2023, 8:57 AM Please see Amion for pager number during day hours 7:00am-4:30pm

## 2023-03-08 ENCOUNTER — Inpatient Hospital Stay (HOSPITAL_COMMUNITY): Payer: 59

## 2023-03-08 DIAGNOSIS — J690 Pneumonitis due to inhalation of food and vomit: Secondary | ICD-10-CM | POA: Diagnosis not present

## 2023-03-08 DIAGNOSIS — K56609 Unspecified intestinal obstruction, unspecified as to partial versus complete obstruction: Secondary | ICD-10-CM | POA: Diagnosis not present

## 2023-03-08 LAB — COMPREHENSIVE METABOLIC PANEL
ALT: 18 U/L (ref 0–44)
AST: 21 U/L (ref 15–41)
Albumin: 2 g/dL — ABNORMAL LOW (ref 3.5–5.0)
Alkaline Phosphatase: 41 U/L (ref 38–126)
Anion gap: 8 (ref 5–15)
BUN: 89 mg/dL — ABNORMAL HIGH (ref 8–23)
CO2: 24 mmol/L (ref 22–32)
Calcium: 8.9 mg/dL (ref 8.9–10.3)
Chloride: 112 mmol/L — ABNORMAL HIGH (ref 98–111)
Creatinine, Ser: 1.16 mg/dL (ref 0.61–1.24)
GFR, Estimated: 59 mL/min — ABNORMAL LOW (ref >60)
Glucose, Bld: 144 mg/dL — ABNORMAL HIGH (ref 70–99)
Potassium: 4.5 mmol/L (ref 3.5–5.1)
Sodium: 144 mmol/L (ref 135–145)
Total Bilirubin: 0.9 mg/dL (ref 0.3–1.2)
Total Protein: 5.3 g/dL — ABNORMAL LOW (ref 6.5–8.1)

## 2023-03-08 LAB — PREPARE RBC (CROSSMATCH)

## 2023-03-08 LAB — HEMOGLOBIN AND HEMATOCRIT, BLOOD
HCT: 23.4 % — ABNORMAL LOW (ref 39.0–52.0)
Hemoglobin: 7.6 g/dL — ABNORMAL LOW (ref 13.0–17.0)

## 2023-03-08 LAB — GLUCOSE, CAPILLARY
Glucose-Capillary: 135 mg/dL — ABNORMAL HIGH (ref 70–99)
Glucose-Capillary: 147 mg/dL — ABNORMAL HIGH (ref 70–99)
Glucose-Capillary: 147 mg/dL — ABNORMAL HIGH (ref 70–99)
Glucose-Capillary: 116 mg/dL — ABNORMAL HIGH (ref 70–99)
Glucose-Capillary: 131 mg/dL — ABNORMAL HIGH (ref 70–99)
Glucose-Capillary: 130 mg/dL — ABNORMAL HIGH (ref 70–99)

## 2023-03-08 LAB — APTT: aPTT: 69 s — ABNORMAL HIGH (ref 24–36)

## 2023-03-08 LAB — MAGNESIUM: Magnesium: 2.4 mg/dL (ref 1.7–2.4)

## 2023-03-08 LAB — PHOSPHORUS: Phosphorus: 4.2 mg/dL (ref 2.5–4.6)

## 2023-03-08 MED ORDER — TRANEXAMIC ACID FOR INHALATION
500.0000 mg | Freq: Three times a day (TID) | RESPIRATORY_TRACT | Status: AC
  Administered 2023-03-08 – 2023-03-09 (×3): 500 mg via RESPIRATORY_TRACT
  Filled 2023-03-08 (×3): qty 10

## 2023-03-08 MED ORDER — FENTANYL CITRATE PF 50 MCG/ML IJ SOSY: 200.0000 ug | PREFILLED_SYRINGE | Freq: Once | INTRAMUSCULAR | Status: DC

## 2023-03-08 MED ORDER — TRACE MINERALS CU-MN-SE-ZN 300-55-60-3000 MCG/ML IV SOLN
INTRAVENOUS | Status: AC
  Filled 2023-03-08: qty 691.2

## 2023-03-08 MED ORDER — ORAL CARE MOUTH RINSE
15.0000 mL | OROMUCOSAL | Status: DC
  Administered 2023-03-08 – 2023-03-23 (×158): 15 mL via OROMUCOSAL

## 2023-03-08 MED ORDER — ROCURONIUM BROMIDE 50 MG/5ML IV SOLN: 100.0000 mg | Freq: Once | INTRAVENOUS | Status: DC

## 2023-03-08 MED ORDER — MIDAZOLAM HCL 2 MG/2ML IJ SOLN: 5.0000 mg | Freq: Once | INTRAMUSCULAR | Status: DC

## 2023-03-08 MED ORDER — PROSOURCE TF20 ENFIT COMPATIBL EN LIQD
60.0000 mL | Freq: Every day | ENTERAL | Status: DC
  Administered 2023-03-09 – 2023-03-21 (×10): 60 mL
  Filled 2023-03-08 (×11): qty 60

## 2023-03-08 MED ORDER — ETOMIDATE 2 MG/ML IV SOLN: 20.0000 mg | Freq: Once | INTRAVENOUS | Status: DC

## 2023-03-08 MED ORDER — HEPARIN (PORCINE) 25000 UT/250ML-% IV SOLN
850.0000 [IU]/h | INTRAVENOUS | Status: DC
  Administered 2023-03-08 – 2023-03-10 (×3): 1300 [IU]/h via INTRAVENOUS
  Administered 2023-03-11: 950 [IU]/h via INTRAVENOUS
  Filled 2023-03-08 (×4): qty 250

## 2023-03-08 MED ORDER — LIDOCAINE-EPINEPHRINE 1 %-1:100000 IJ SOLN: 20.0000 mL | Freq: Once | INTRAMUSCULAR | Status: DC

## 2023-03-08 MED ORDER — ORAL CARE MOUTH RINSE: 15.0000 mL | OROMUCOSAL | Status: DC | PRN

## 2023-03-08 MED ORDER — SODIUM CHLORIDE 0.9% IV SOLUTION: Freq: Once | INTRAVENOUS | Status: AC

## 2023-03-08 MED ORDER — OSMOLITE 1.5 CAL PO LIQD
1000.0000 mL | ORAL | Status: DC
  Administered 2023-03-08 – 2023-03-19 (×7): 1000 mL
  Filled 2023-03-08 (×15): qty 1000

## 2023-03-08 MED ORDER — HYDROMORPHONE HCL 1 MG/ML IJ SOLN: 0.5000 mg | Freq: Once | INTRAMUSCULAR | Status: DC

## 2023-03-08 NOTE — Evaluation (Signed)
Physical Therapy Re-Evaluation Patient Details Name: Stephen Flynn MRN: [id number] DOB: 12-03-1927 Today's Date: 03/08/2023  History of Present Illness  89 y/o male admitted 8/25 with abdominal pain, SOB with bowel obstruction. 8/26 developed hypotension and required ex-lap with small bowel resection, remained intubated post op. Extubated 8/27. Reintubated 9/1. PMhx: GIB, stroke, HTN, Afib, neuropathy, CHF, obesity  Clinical Impression  Pt admitted with above diagnosis. Pt re-evaluated and goals revised today as goals unmet due to pt with medical setbacks and re-intubation. Pt was able to sit EOB x 15 min with +2 max to min assist today.  Still feel that pt would benefit from > 3hours therapy a day post acute. Pt currently with functional limitations due to the deficits listed below (see PT Problem List). Pt will benefit from acute skilled PT to increase their independence and safety with mobility to allow discharge.           If plan is discharge home, recommend the following: Two people to help with walking and/or transfers;Two people to help with bathing/dressing/bathroom;Assist for transportation;Assistance with cooking/housework   Can travel by private vehicle        Equipment Recommendations Other (comment) (TBD)  Recommendations for Other Services  Rehab consult    Functional Status Assessment Patient has had a recent decline in their functional status and demonstrates the ability to make significant improvements in function in a reasonable and predictable amount of time.     Precautions / Restrictions Precautions Precautions: Fall;Other (comment) Precaution Comments: NG tube, wound vac, intubated Restrictions Weight Bearing Restrictions: No      Mobility  Bed Mobility Overal bed mobility: Needs Assistance Bed Mobility: Rolling, Sidelying to Sit, Sit to Supine Rolling: Total assist, +2 for physical assistance, +2 for safety/equipment Sidelying to sit: Total assist, +2 for  physical assistance, +2 for safety/equipment   Sit to supine: Total assist, +2 for physical assistance, +2 for safety/equipment   General bed mobility comments: Pt with good initation with cues for sequencing. Utilizing log roll to optimize comfort. Overall performing <25% but good participation and effort throughout    Transfers                   General transfer comment: NT    Ambulation/Gait                  Stairs            Wheelchair Mobility     Tilt Bed    Modified Rankin (Stroke Patients Only)       Balance Overall balance assessment: Needs assistance Sitting-balance support: Feet supported, Bilateral upper extremity supported, Single extremity supported Sitting balance-Leahy Scale: Poor Sitting balance - Comments: Initally max A progressing to very short bouts of min A due to need for heavy cueing to maintain. L posterior lean throughout with pt good initiation, however, to use L elbow for weightbearing. Postural control: Posterior lean, Left lateral lean                                   Pertinent Vitals/Pain Pain Assessment Pain Assessment: CPOT Faces Pain Scale: No hurt Facial Expression: Grimacing Body Movements: Absence of movements Muscle Tension: Relaxed Compliance with ventilator (intubated pts.): N/A Vocalization (extubated pts.): N/A CPOT Total: 2 Pain Location: abdomen, BLE Pain Descriptors / Indicators: Discomfort, Sore Pain Intervention(s): Limited activity within patient's tolerance, Monitored during session, Repositioned    Home Living  Family/patient expects to be discharged to:: Private residence Living Arrangements: Spouse/significant other Available Help at Discharge: Family;Available PRN/intermittently Type of Home: Apartment Home Access: Elevator       Home Layout: One level Home Equipment: Warden/ranger (2 wheels);Cane - single point;Tub bench;BSC/3in1;Grab bars -  tub/shower;Lift chair      Prior Function Prior Level of Function : Needs assist             Mobility Comments: pt uses cane, RW, and wheelchair based on how he feels. Sounds like most recently has been using wheelchair frequently. Propels wheelchair with LLE. Pt has been sleeping in lift chair. ADLs Comments: wife assists with donning socks, in/out shower, and cooking     Extremity/Trunk Assessment   Upper Extremity Assessment Upper Extremity Assessment: Defer to OT evaluation RUE Deficits / Details: Able to lift RUE off bed and perform at least ~25 degrees shoulder flexion. Good grip strength reaching for therapist during bed mobility and EOB LUE Deficits / Details: Significant edema in LUE esp forearm and hand with two large (1+in blisters on dorsum of forearm). Pt with some generalized weakness and able to perform full digit flexion and extension into fist with increased time and several repetitions to achieve. Repositioned with pillows under and in chair position at end of session to reduce edema.    Lower Extremity Assessment Lower Extremity Assessment: RLE deficits/detail;LLE deficits/detail RLE Deficits / Details: grossly 2-/5 LLE Deficits / Details: grossly 2-/5    Cervical / Trunk Assessment Cervical / Trunk Assessment: Kyphotic  Communication   Communication Communication:  (low voice, jumbled words, difficult to understand at times.)  Cognition Arousal: Alert Behavior During Therapy: Flat affect Overall Cognitive Status: Difficult to assess Area of Impairment: Attention, Following commands, Safety/judgement, Problem solving                   Current Attention Level: Sustained   Following Commands: Follows one step commands with increased time, Follows one step commands inconsistently (needing up to mod cues for following commands and attention) Safety/Judgement: Decreased awareness of deficits, Decreased awareness of safety Awareness: Emergent Problem  Solving: Requires verbal cues, Requires tactile cues General Comments: Following commands quickly initially; more distracted and needing more cues as session progressed requiring up to mod cues to follow commands and attend to therapist. Pt pleasant and agreeable to session by way of head nod and with 80%+ accuracy of use of head nods to communicate. pt requiring cues for sequencing bed mobiltiy and to sustain engagement in sitting balance EOB.        General Comments General comments (skin integrity, edema, etc.): ventilated via oral intubation at 30% FiO2 with PEEP 5    Exercises General Exercises - Lower Extremity Long Arc Quad: AAROM, Both, 5 reps, Seated   Assessment/Plan    PT Assessment Patient needs continued PT services  PT Problem List Decreased strength;Decreased balance;Pain;Decreased mobility;Decreased activity tolerance       PT Treatment Interventions Functional mobility training;Balance training;Patient/family education;Gait training;Therapeutic activities;Therapeutic exercise;Cognitive remediation    PT Goals (Current goals can be found in the Care Plan section)  Acute Rehab PT Goals Patient Stated Goal: home PT Goal Formulation: With patient/family Time For Goal Achievement: 03/22/23 Potential to Achieve Goals: Good    Frequency Min 1X/week     Co-evaluation PT/OT/SLP Co-Evaluation/Treatment: Yes Reason for Co-Treatment: Complexity of the patient's impairments (multi-system involvement);For patient/therapist safety PT goals addressed during session: Mobility/safety with mobility  AM-PAC PT "6 Clicks" Mobility  Outcome Measure Help needed turning from your back to your side while in a flat bed without using bedrails?: A Lot Help needed moving from lying on your back to sitting on the side of a flat bed without using bedrails?: Total Help needed moving to and from a bed to a chair (including a wheelchair)?: Total Help needed standing up from a chair  using your arms (e.g., wheelchair or bedside chair)?: Total Help needed to walk in hospital room?: Total Help needed climbing 3-5 steps with a railing? : Total 6 Click Score: 7    End of Session Equipment Utilized During Treatment: Oxygen Activity Tolerance: Patient limited by fatigue Patient left: in bed;with call bell/phone within reach;with bed alarm set Nurse Communication: Mobility status PT Visit Diagnosis: Other abnormalities of gait and mobility (R26.89);Pain;Muscle weakness (generalized) (M62.81) Pain - Right/Left: Right Pain - part of body: Leg    Time: 1137-1205 PT Time Calculation (min) (ACUTE ONLY): 28 min   Charges:     PT Treatments $Therapeutic Activity: 8-22 mins PT General Charges $$ ACUTE PT VISIT: 1 Visit         Seiya Silsby M,PT Acute Rehab Services [phone]   Bevelyn Buckles 03/08/2023, 1:46 PM

## 2023-03-08 NOTE — Progress Notes (Addendum)
eLink Physician-Brief Progress Note Patient Name: Stephen Flynn DOB: [birth year] MRN: [id number]   Date of Service  03/08/2023  HPI/Events of Note  89 year old male who presented to the ICU after small bowel obstruction and ex lap with small bowel resection complicated by acute hypoxic respiratory failure and shock thought to be secondary to sepsis from aspiration pneumonia.   Experiencing excessive pain despite Dilaudid every 4 hours.  Requesting additional pain meds  eICU Interventions  Additional dose of Dilaudid x 1.   0643 -0.6, minimal drop from 7.0.  Will add 1 unit PRBC.  Intervention Category Intermediate Interventions: Pain - evaluation and management  Wynelle Dreier 03/08/2023, 6:07 AM

## 2023-03-08 NOTE — Progress Notes (Signed)
Occupational Therapy Treatment Patient Details Name: Stephen Flynn MRN: [id number] DOB: Mar 31, 1928 Today's Date: 03/08/2023   History of present illness 89 y/o male admitted 8/25 with abdominal pain, SOB with bowel obstruction. 8/26 developed hypotension and required ex-lap with small bowel resection, remained intubated post op. Extubated 8/27. Reintubated 9/1. PMhx: GIB, stroke, HTN, Afib, neuropathy, CHF, obesity   OT comments  Pt intubated since last session; RN agreeable to re-eval. Goals updated according to current functional level. Pt performing bed mobility with total A +2 and sitting EOB with max A -min A for assist/cueing to maintain midline. Pt with good initiation throughout. Pt following one step commands with up to mod cues. Able to communicate simple yes/no with head nod. Will continue to follow. Continue to recommend intensive inpatient rehabilitation once medically ready.       If plan is discharge home, recommend the following:  Two people to help with walking and/or transfers;Two people to help with bathing/dressing/bathroom;Assistance with cooking/housework;Assistance with feeding;Direct supervision/assist for medications management;Direct supervision/assist for financial management;Assist for transportation;Help with stairs or ramp for entrance   Equipment Recommendations  Other (comment) (defer)    Recommendations for Other Services      Precautions / Restrictions Precautions Precautions: Fall;Other (comment) Precaution Comments: NG tube, wound vac, intubated Restrictions Weight Bearing Restrictions: No       Mobility Bed Mobility Overal bed mobility: Needs Assistance Bed Mobility: Rolling, Sidelying to Sit, Sit to Supine Rolling: Total assist, +2 for physical assistance, +2 for safety/equipment Sidelying to sit: Total assist, +2 for physical assistance, +2 for safety/equipment   Sit to supine: Total assist, +2 for physical assistance, +2 for safety/equipment    General bed mobility comments: Pt with good initation with cues for sequencing. Utilizing log roll to optimize comfort. Overall performing <25% but good participation and effort throughout    Transfers                   General transfer comment: NT     Balance Overall balance assessment: Needs assistance Sitting-balance support: Feet supported, Bilateral upper extremity supported, Single extremity supported Sitting balance-Leahy Scale: Poor Sitting balance - Comments: Initally max A progressing to very short bouts of min A due to need for heavy cueing to maintain. L posterior lean throughout with pt good initiation, however, to use L elbow for weightbearing. Postural control: Posterior lean, Left lateral lean                                 ADL either performed or assessed with clinical judgement   ADL Overall ADL's : Needs assistance/impaired Eating/Feeding: NPO   Grooming: Bed level;Sitting;Maximal assistance                                      Extremity/Trunk Assessment Upper Extremity Assessment Upper Extremity Assessment: Generalized weakness;RUE deficits/detail;LUE deficits/detail (Not formally assessed) RUE Deficits / Details: Able to lift RUE off bed and perform at least ~25 degrees shoulder flexion. Good grip strength reaching for therapist during bed mobility and EOB LUE Deficits / Details: Significant edema in LUE esp forearm and hand with two large (1+in blisters on dorsum of forearm). Pt with some generalized weakness and able to perform full digit flexion and extension into fist with increased time and several repetitions to achieve. Repositioned with pillows under and in chair position at  end of session to reduce edema.   Lower Extremity Assessment Lower Extremity Assessment: Defer to PT evaluation        Vision   Additional Comments: No visual report; making good eye contact   Perception     Praxis      Cognition  Arousal: Alert Behavior During Therapy: Flat affect Overall Cognitive Status: Difficult to assess Area of Impairment: Attention, Following commands, Safety/judgement, Problem solving                   Current Attention Level: Sustained   Following Commands: Follows one step commands with increased time, Follows one step commands inconsistently (needing up to mod cues for following commands and attention) Safety/Judgement: Decreased awareness of deficits, Decreased awareness of safety   Problem Solving: Requires verbal cues, Requires tactile cues General Comments: Following commands quickly initially; more distracted and needing more cues as session progressed requiring up to mod cues to follow commands and attend to therapist. Pt pleasant and agreeable to session by way of head nod and with 80%+ accuracy of use of head nods to communicate. pt requiring cues for sequencing bed mobiltiy and to sustain engagement in sitting balance EOB.        Exercises      Shoulder Instructions       General Comments ventilated via oral intubation at 30% FiO2 with PEEP 5    Pertinent Vitals/ Pain       Pain Assessment Pain Assessment: CPOT Facial Expression: Grimacing Compliance with ventilator (intubated pts.): Tolerating ventilator or movement (ver minimal coughing during OT/PT session) CPOT Total: 2 Pain Location: abdomen, BLE Pain Descriptors / Indicators: Discomfort, Sore Pain Intervention(s): Limited activity within patient's tolerance, Monitored during session  Home Living                                          Prior Functioning/Environment              Frequency  Min 2X/week        Progress Toward Goals  OT Goals(current goals can now be found in the care plan section)  Progress towards OT goals: Not progressing toward goals - comment (intubated since last session; reassessment needed)  Acute Rehab OT Goals Patient Stated Goal: unable,  intubated orally. Did nod that he wanted to get to EOB and sit up OT Goal Formulation: With patient Time For Goal Achievement: 03/22/23 Potential to Achieve Goals: Good  Plan      Co-evaluation                 AM-PAC OT "6 Clicks" Daily Activity     Outcome Measure   Help from another person eating meals?: Total Help from another person taking care of personal grooming?: A Lot Help from another person toileting, which includes using toliet, bedpan, or urinal?: Total Help from another person bathing (including washing, rinsing, drying)?: Total Help from another person to put on and taking off regular upper body clothing?: Total Help from another person to put on and taking off regular lower body clothing?: Total 6 Click Score: 7    End of Session    OT Visit Diagnosis: Unsteadiness on feet (R26.81);Muscle weakness (generalized) (M62.81);Other symptoms and signs involving cognitive function   Activity Tolerance Patient tolerated treatment well   Patient Left in bed;with call bell/phone within reach;with bed alarm set;with nursing/sitter in room  Nurse Communication Mobility status        Time: 1610-9604 OT Time Calculation (min): 28 min  Charges: OT General Charges $OT Visit: 1 Visit OT Evaluation $OT Re-eval: 1 Re-eval  Tyler Deis, OTR/L [facility] Acute Rehabilitation Office: [phone]   Myrla Halsted 03/08/2023, 1:06 PM

## 2023-03-08 NOTE — Progress Notes (Signed)
NAMECyruss Stauffer, MRN:  [id number], DOB:  1928/06/23, LOS: 11 ADMISSION DATE:  02/25/2023, CONSULTATION DATE: 8/26 REFERRING MD: Dr. Randol Kern, CHIEF COMPLAINT: Hypotension  History of Present Illness:  Patient is encephalopathic and/or intubated; therefore, history has been obtained from chart review.  89 year old male with past medical history as below, which is significant for atrial fibrillation on Eliquis, wheelchair-bound, HFpEF, and stroke who presented to [facility] emergency department on 8/25 with complaints of acute onset abdominal pain with no bowel movement for 3 days prior to presentation.  Also sounds like he had not been consistently taking his Eliquis.  Upon arrival to the emergency department the patient was noted to be in atrial fibrillation with RVR to the 120s.  Started on diltiazem infusion and placed on heparin drip.  CT of the abdomen and pelvis demonstrated small bowel obstruction and moderate ascites.  General surgery was consulted and recommended conservative management with NG tube decompression.  He was admitted to the hospitalist service.  In the a.m. hours of 8/26 the patient developed waning mental status and became nonverbal.  He also developed hypotension with systolic pressures in the 70s and desaturation to the 80s.  His abdomen became more firm and rigid and exquisitely tender to pain.  The surgical team was at bedside and determined the patient would likely require urgent surgery.  PCCM was consulted for hypotension and hypoxia.  Pertinent  Medical History   has a past medical history of Anemia, Aortic insufficiency, Chest pain, H/O: GI bleed, Hyperlipidemia, Hypertension, Leg cramps, and Stroke (HCC).   Significant Hospital Events: Including procedures, antibiotic start and stop dates in addition to other pertinent events   8/25 admit for SBO 8/26 acute abdomen, tx to ICU for pressors. PICC line placed. Underwent Ex lap and small bowel  resection 8/27 Extubated 8/27 Reintubated 9/1 9/1 bronchoscopy, BAL >> few yeast, ?contaminant.   Interim History / Subjective:  Tolerated PSV 9/4 Off Precedex at this time  Norepinephrine 2 S Cr improved 1.28 > 1.16 Concerns of urinary retention with post void residual of 221 mL. His foley was removed yesterday.   Objective   Blood pressure 97/62, pulse 74, temperature 97.6 F (36.4 C), temperature source Oral, resp. rate 12, height 5\' 9"  (1.753 m), weight 109 kg, SpO2 100%.      Vent Mode: PSV;CPAP FiO2 (%):  [30 %] 30 % Set Rate:  [14 bmp] 14 bmp Vt Set:  [560 mL] 560 mL PEEP:  [5 cmH20] 5 cmH20 Pressure Support:  [5 cmH20-8 cmH20] 5 cmH20   Intake/Output Summary (Last 24 hours) at 03/08/2023 0924 Last data filed at 03/08/2023 0800 Gross per 24 hour  Intake 2697.17 ml  Output 1608 ml  Net 1089.17 ml   Filed Weights   03/07/23 0425 03/08/23 0500 03/08/23 0830  Weight: 109 kg 109 kg 109 kg    Examination: General: Thin elderly man, no distress, ventilated HEENT: ET tube in place, oropharynx otherwise clear, dry mucous membranes Neuro: Opens eyes, some grimace with stimulation, does not follow commands Chest: Scattered bilateral inspiratory crackles, no wheeze Heart: Irregularly irregular, distant, no murmur Abdomen: Wound VAC in place, nondistended with hypoactive bowel sounds Extremities: Left upper extremity edema. 1+ pitting edema bilateral lower extremities  Skin: No rash. Two serous blisters on left arm   Resolved Hospital Problem list     Assessment & Plan:  Small bowel obstruction s/p ex lap and small bowel resection.  Acute abdomen -Appreciate CCS management -  TF reinitiated @20mL /hr on 9/4  -Continue TPN for now  -Wound VAC changes as per CCS plans  Acute respiratory failure with hypoxia and hypercapnia secondary to aspiration pneumonia Sepsis with septic shock due to aspiration pneumonia Acute septic encephalopathy -Continue PSV as tolerated. Largest  barrier to extubation/progress appeared to be secretion management and airway protection, cough strength  -Plan to proceed with tracheostomy on 9/9.   - Hold Eliquis pre-procedure   - Start heparin  -Continue empiric meropenem for presumed aspiration pneumonia.   -Few yeast growing on BAL culture - possibly a contaminant. Will continue to follow culture  -Sedation as per PAD protocol, Precedex and intermittent Dilaudid -Off norepinephrine at this time   Anemia  Hgb 6.6 without evidence of bleeding. Has a history of iron deficiency anemia.  -1 unit pRBC given 9/5  -Follow CBC  Paroxysmal atrial fibrillation with rapid ventricular response Heart rate is controlled, remain in A-fib -Continue amiodarone and Eliquis as ordered  Chronic HFrEF Ejection fraction is 40 to 45% Moderate LVH -Follow I's/O, exam notable for 1+ lower extremity edema  -Given need for blood transfusion and borderline MAP of 68-69, will hold off diuresis   AKI, likely due to dehydration, sepsis -Following BMP, urine output -Bladder scan q8h, may need in and out cath  -Renal dose medications, ensure adequate renal perfusion  Diabetes mellitus, controlled Hemoglobin A1c 6.5 -CBG monitoring and sliding scale insulin as per protocol -CBG goal <180  Baseline debility: Wheelchair-bound -Family desires full scope of care.  Certainly his baseline status will impact his ability to recover from this as well as his QOL going forward  Moderate malnutrition -Continue TPN -TF reinitiated @20  mL/hr, continue to titrate -Appreciate dietitian assistance  Best Practice (right click and "Reselect all SmartList Selections" daily)   Diet/type: NPO  trickle feeds @20mL /hr. On TPN DVT prophylaxis: Heparin GI prophylaxis: PPI Lines: Central line Foley:  N/A Code Status:  full code Last date of multidisciplinary goals of care discussion [03/07/23: family updated, discussed options of short term support and one way extubation  vs long term support and tracheostomy. Family opted to proceed with tracheostomy]  Tempie Hoist, MS4

## 2023-03-08 NOTE — Progress Notes (Addendum)
eLink Physician-Brief Progress Note Patient Name: Stephen Flynn DOB: 1928/03/02 MRN: [id number]   Date of Service  03/08/2023  HPI/Events of Note  89 year old male who presented to the ICU after small bowel obstruction and ex lap with small bowel resection complicated by acute hypoxic respiratory failure and shock thought to be secondary to sepsis from aspiration pneumonia.   Having more frank red blood coming up with suctioning.  Eliquis was held in anticipation of potential tracheostomy Monday.  On a heparin infusion    Intermittent cough/vent dyssynchrony but appropriate saturations  eICU Interventions  Limited endotracheal suctioning if possible.  Reassess endotracheal tube positioning with repeat chest x-ray  TXA inhaled for 24 hours     Intervention Category Intermediate Interventions: Bleeding - evaluation and treatment with blood products  [name] 03/08/2023, 8:58 PM

## 2023-03-08 NOTE — Progress Notes (Signed)
Nutrition Follow-up  DOCUMENTATION CODES:   Non-severe (moderate) malnutrition in context of chronic illness  INTERVENTION:  - Plan to continue TPN at this time. Remains at goal of 51mL/hr, meeting 100% of estimated needs.  -  TPN, management per pharmacy - Due to the length of remaining bowel pt is at high risk for short bowel syndrome and will likely require TPN for the remainder of his life.   - Per Surgery, can start advancing tube feeds towards goal as tolerated.  Osmolite 1.5 at 50 ml/h (1200 ml per day) - Increase to 39mL/hr and then being advancing by 10mL q12h to goal as tolerated Prosource TF20 60 ml 1x/d - Provides 1880 kcal, 95 gm protein, 914 ml free water daily   NUTRITION DIAGNOSIS:   Moderate Malnutrition related to chronic illness as evidenced by mild fat depletion, severe muscle depletion. *ongoing  GOAL:   Patient will meet greater than or equal to 90% of their needs *met with TPN  MONITOR:   I & O's, Vent status, Labs, Weight trends  REASON FOR ASSESSMENT:   Consult Enteral/tube feeding initiation and management (gradual advancement as tolerated)  ASSESSMENT:   Pt with hx of HTN, HLD, CVA, CHF, DM type 2, atrial fibrillation, and AAA presented to ED with abdominal pain. Imaging showed a SBO. Initially treated conservatively but ultimately developed hypotension and was transferred to ICU and then taken to OR for repair.  8/25 - admitted with SBO 8/26 - Op, exploratory laparotomy with bowel resection, Small bowel was resected from the distal jejunum to the distal ileum. ~120cm of remaining bowel. PICC placed 8/27 - TPN initiated, extubated 8/28 - wound vac placed 8/30 - cortrak placed (extreme distal stomach or proximal duodenum); TF started at trickle rate 8/31 - TF increased to 6mL/hr 9/1 - Re-intubated; TF increased to 59mL/hr 9/2 - TF held due to distension and no bowel function 9/4 - TF started at trickle rate   Patient remains intubated but  off pressors.  TPN infusing at new goal rate of 49mL/h (provides 103 g of protein and 1814 kcals per day). Pt has <200cm bowel remaining which puts him at risk for short gut syndrome. Pt is at high risk for requiring TPN for the remainder of his life. Will have to carefully monitor when attempting to cut back on TPN infusion.    Per Surgery, can start increasing tube feeds towards goal as tolerated today. Plan for 10mL Q12H advancements. Discussed with RN.   Per CCM, plan for trach on 9/9.   Medications reviewed and include: Colace, Miralax Precedex  Labs reviewed:  HA1C 6.5 Blood Glucose 118-147 x24 hours Triglycerides 35 (as of 9/2)   Diet Order:   Diet Order     None       EDUCATION NEEDS:  Not appropriate for education at this time  Skin:  Skin Assessment: Reviewed RN Assessment (surgical incision, midline)  Last BM:  9/3  Height:  Ht Readings from Last 1 Encounters:  03/08/23 5\' 9"  (1.753 m)   Weight:  Wt Readings from Last 1 Encounters:  03/08/23 109 kg   Ideal Body Weight:  72.7 kg  BMI:  Body mass index is 35.49 kg/m.  Estimated Nutritional Needs:  Kcal:  1800-2000 kcal/d Protein:  90-105g/d Fluid:  1.8L/d    Shelle Iron RD, LDN For contact information, refer to [facility].

## 2023-03-08 NOTE — Progress Notes (Signed)
SLP Cancellation Note  Patient Details Name: Brynn Oswald MRN: [id number] DOB: 1927-09-13   Cancelled treatment:       Reason Eval/Treat Not Completed: Medical issues which prohibited therapy (remains on vent). Will f/u as able.    Mahala Menghini., M.A. CCC-SLP Acute Rehabilitation Services Office [phone]  Secure chat preferred  03/08/2023, 7:48 AM

## 2023-03-08 NOTE — Progress Notes (Signed)
[facility] Progress Note  10 Days Post-Op  Subjective: CC-  On the vent. Remains off pressors. Last BM 2 days ago. TF running at 20cc/hr and patient tolerating without n/v. Getting 1u PRBCs for hgb 6.6  Objective: Vital signs in last 24 hours: Temp:  [97.6 F (36.4 C)-98.6 F (37 C)] 98.6 F (37 C) (09/05 0754) Pulse Rate:  [65-116] 78 (09/05 0815) Resp:  [11-19] 12 (09/05 0815) BP: (76-158)/(53-116) 94/56 (09/05 0815) SpO2:  [76 %-100 %] 100 % (09/05 0815) FiO2 (%):  [30 %] 30 % (09/05 0721) Weight:  [109 kg] 109 kg (09/05 0500) Last BM Date : 03/06/23  Intake/Output from previous day: 09/04 0701 - 09/05 0700 In: 2764.2 [I.V.:1966.3; NG/GT:587.3; IV Piggyback:210.5] Out: 1618 [Urine:1518; Drains:100] Intake/Output this shift: No intake/output data recorded.  PE: Gen: NAD, on the vent HEENT: ETT, Cortrak  Card:  A fib, regular rate Pulm:  mechanically ventilated Abd: Soft, mild distension, few bowel sounds heard, mild left sided tenderness, vac to midline with good seal and serosanguinous fluid in canister  Lab Results:  Recent Labs    03/07/23 0512 03/08/23 0603  WBC 5.9 5.8  HGB 7.0* 6.6*  HCT 21.1* 20.4*  PLT 219 230   BMET Recent Labs    03/07/23 0512 03/08/23 0603  NA 143 144  K 4.5 4.5  CL 112* 112*  CO2 25 24  GLUCOSE 144* 144*  BUN 90* 89*  CREATININE 1.28* 1.16  CALCIUM 8.8* 8.9   PT/INR No results for input(s): "LABPROT", "INR" in the last 72 hours. CMP     Component Value Date/Time   NA 144 03/08/2023 0603   K 4.5 03/08/2023 0603   CL 112 (H) 03/08/2023 0603   CO2 24 03/08/2023 0603   GLUCOSE 144 (H) 03/08/2023 0603   BUN 89 (H) 03/08/2023 0603   CREATININE 1.16 03/08/2023 0603   CALCIUM 8.9 03/08/2023 0603   PROT 5.3 (L) 03/08/2023 0603   ALBUMIN 2.0 (L) 03/08/2023 0603   AST 21 03/08/2023 0603   ALT 18 03/08/2023 0603   ALKPHOS 41 03/08/2023 0603   BILITOT 0.9 03/08/2023 0603   GFRNONAA 59 (L) 03/08/2023 0603    GFRAA >60 05/16/2019 1110   Lipase     Component Value Date/Time   LIPASE 16 02/25/2023 1433       Studies/Results: VAS [place] UPPER EXTREMITY VENOUS DUPLEX  Result Date: 03/07/2023 UPPER VENOUS STUDY  Patient Name:  Stephen Flynn  Date of Exam:   03/07/2023 Medical Rec #: [id number]      Accession #:    [id number] Date of Birth: 1927/07/06       Patient Gender: M Patient Age:   89 years Exam Location:  [facility] Procedure:      VAS [place] UPPER EXTREMITY VENOUS DUPLEX Referring Phys: Levy Pupa --------------------------------------------------------------------------------  Indications: Edema Risk Factors: Immobility Surgery Laparotomy 02/26/23 obesity. Anticoagulation: Heparin. Limitations: Bandages and Limited range of motion. Comparison Study: No prior study Performing Technologist: Shona Simpson  Examination Guidelines: A complete evaluation includes B-mode imaging, spectral Doppler, color Doppler, and power Doppler as needed of all accessible portions of each vessel. Bilateral testing is considered an integral part of a complete examination. Limited examinations for reoccurring indications may be performed as noted.  Right Findings: +----------+------------+---------+-----------+----------+-------+ RIGHT     CompressiblePhasicitySpontaneousPropertiesSummary +----------+------------+---------+-----------+----------+-------+ IJV           Full       Yes       Yes                      +----------+------------+---------+-----------+----------+-------+  Subclavian    Full       Yes       Yes                      +----------+------------+---------+-----------+----------+-------+ Axillary      Full       Yes       Yes                      +----------+------------+---------+-----------+----------+-------+ Brachial      Full       Yes       Yes                      +----------+------------+---------+-----------+----------+-------+ Radial        Full       Yes        Yes                      +----------+------------+---------+-----------+----------+-------+ Ulnar         Full       Yes       Yes                      +----------+------------+---------+-----------+----------+-------+ Cephalic      Full       Yes       Yes                      +----------+------------+---------+-----------+----------+-------+ Basilic       Full       Yes       Yes                      +----------+------------+---------+-----------+----------+-------+  Left Findings: +----------+------------+---------+-----------+----------+---------------+ LEFT      CompressiblePhasicitySpontaneousProperties    Summary     +----------+------------+---------+-----------+----------+---------------+ IJV                      Yes       Yes              Patent by color +----------+------------+---------+-----------+----------+---------------+ Subclavian    Full       Yes       Yes                              +----------+------------+---------+-----------+----------+---------------+ Axillary      Full       Yes       Yes                              +----------+------------+---------+-----------+----------+---------------+ Brachial      Full       Yes       Yes                              +----------+------------+---------+-----------+----------+---------------+ Radial        Full       Yes       Yes                              +----------+------------+---------+-----------+----------+---------------+ Ulnar         Full       Yes  Yes                              +----------+------------+---------+-----------+----------+---------------+ Cephalic      Full       Yes       Yes                              +----------+------------+---------+-----------+----------+---------------+ Basilic       Full       Yes       Yes                              +----------+------------+---------+-----------+----------+---------------+  *See  table(s) above for measurements and observations.  Diagnosing physician: Heath Lark Electronically signed by Heath Lark on 03/07/2023 at 6:23:33 PM.    Final    DG Chest Port 1 View  Result Date: 03/07/2023 CLINICAL DATA:  [phone] with respiratory failure. EXAM: PORTABLE CHEST 1 VIEW COMPARISON:  Portable chest 03/04/2023 FINDINGS: 4:27 a.m. ETT tip is 2.6 cm from the carina. Feeding tube is well inside the stomach extending toward the gastric antrum but the tip is not seen. Right PICC terminates in the upper SVC. Stable cardiomegaly. Central vascular prominence noted without overt edema. There are low lung volumes. Continued consolidation or atelectasis in the left lower lobe. The lungs are otherwise clear. Small left pleural effusion also unchanged. Stable mediastinum with calcification in the aorta and tortuosity. No new osseous findings. Compare: Overall aeration seems unchanged. IMPRESSION: No significant interval change. Persistent consolidation or atelectasis in the left lower lobe. Stable small left pleural effusion. Stable cardiomegaly and central vascular prominence. Aortic atherosclerosis. Electronically Signed   By: Almira Bar M.D.   On: 03/07/2023 07:03    Anti-infectives: Anti-infectives (From admission, onward)    Start     Dose/Rate Route Frequency Ordered Stop   03/04/23 1000  meropenem (MERREM) 1 g in sodium chloride 0.9 % 100 mL IVPB        1 g 200 mL/hr over 30 Minutes Intravenous Every 12 hours 03/04/23 0855 03/11/23 0959   03/01/23 1500  ceFEPIme (MAXIPIME) 2 g in sodium chloride 0.9 % 100 mL IVPB        2 g 200 mL/hr over 30 Minutes Intravenous Every 12 hours 03/01/23 1124 03/02/23 0356   03/01/23 0300  ceFEPIme (MAXIPIME) 2 g in sodium chloride 0.9 % 100 mL IVPB  Status:  Discontinued        2 g 200 mL/hr over 30 Minutes Intravenous Every 24 hours 02/28/23 1141 03/01/23 1124   02/26/23 1445  piperacillin-tazobactam (ZOSYN) IVPB 3.375 g  Status:  Discontinued         3.375 g 100 mL/hr over 30 Minutes Intravenous Every 8 hours 02/26/23 1436 02/26/23 1445   02/26/23 1445  ceFEPIme (MAXIPIME) 2 g in sodium chloride 0.9 % 100 mL IVPB  Status:  Discontinued        2 g 200 mL/hr over 30 Minutes Intravenous Every 12 hours 02/26/23 1446 02/28/23 1141   02/26/23 1445  metroNIDAZOLE (FLAGYL) IVPB 500 mg        500 mg 100 mL/hr over 60 Minutes Intravenous Every 12 hours 02/26/23 1446 03/02/23 1719   02/26/23 1015  meropenem (MERREM) 1 g in sodium chloride 0.9 % 100 mL IVPB        1 g  200 mL/hr over 30 Minutes Intravenous  Once 02/26/23 8413 02/26/23 1743        Assessment/Plan Small bowel obstruction involving a medial adhesion across the distal jejunum  -POD#10 s/p Exploratory laparotomy small bowel resection and primary anastomosis 8/26 Dr. Luisa Hart - approximately 120 cm of small bowel remaining  - Reintubated 9/1. On meropenem for aspiration pneumonia.  - Gradually advance tube feedings today. Continue TPN - abdominal wound vac MWF    FEN - IVF, NPO, TPN, advance TF  VTE - eliquis ID - completed 5d cefepime/flagyl postop on 8/30. Now on merrem for aspiration pneumonia Foley - present   AKI ABL anemia A fib RVR - On amiodarone, eliquis Wheelchair/walker bound HTN H/O CVA HLD Diastolic CHF DM    LOS: 11 days    Franne Forts, [facility] 03/08/2023, 8:27 AM Please see Amion for pager number during day hours 7:00am-4:30pm

## 2023-03-08 NOTE — Progress Notes (Signed)
ANTICOAGULATION CONSULT NOTE - Initial Consult  Pharmacy Consult for Heparin Indication: atrial fibrillation  Allergies  Allergen Reactions   Penicillin G     Other reaction(s): Drowsy   Penicillins Hives    Did it involve swelling of the face/tongue/throat, SOB, or low BP? No Did it involve sudden or severe rash/hives, skin peeling, or any reaction on the inside of your mouth or nose? yes Did you need to seek medical attention at a hospital or doctor's office? yes When did it last happen?  child     If all above answers are "NO", may proceed with cephalosporin use.    Patient Measurements: Height: 5' 9.02" (175.3 cm) Weight: 109 kg (240 lb 4.8 oz) IBW/kg (Calculated) : 70.74 Heparin Dosing Weight: 95 kg  Vital Signs: Temp: 98.6 F (37 C) (09/05 0754) Temp Source: Axillary (09/05 0754) BP: 124/96 (09/05 0700) Pulse Rate: 74 (09/05 0719)  Labs: Recent Labs    03/06/23 0314 03/07/23 0436 03/07/23 0512 03/08/23 0603  HGB 7.1* 6.9* 7.0* 6.6*  HCT 21.6* 19.9* 21.1* 20.4*  PLT 227 210 219 230  CREATININE 1.59*  --  1.28* 1.16    Estimated Creatinine Clearance: 48.4 mL/min (by C-G formula based on SCr of 1.16 mg/dL).   Medical History: Past Medical History:  Diagnosis Date   Anemia    Aortic insufficiency    Chest pain    H/O: GI bleed    Hyperlipidemia    Hypertension    Leg cramps    Stroke [facility])     Assessment: 89 YOM s/p SBR on Eliqius for atrial fibrillation. Tracheostomy planned 9/9 - plan to hold Eliquis and start heparin until trach completed.  Will use aPTT until levels are correlating Will target low goal given borderline hemoglobin requiring intermittent transfusions  Goal of Therapy:  Heparin level 0.3-0.5 units/ml aPTT 66-85 seconds Monitor platelets by anticoagulation protocol: Yes   Plan:  Start heparin without bolus at 1300 units/hr at 1200 Check aPTT in 8 hours Daily CBC and heparin level/aPTT  Eldridge Scot, PharmD, BCCCP Clinical  Pharmacist 03/08/2023, 8:54 AM

## 2023-03-08 NOTE — Progress Notes (Signed)
PHARMACY - TOTAL PARENTERAL NUTRITION CONSULT NOTE   Indication: Small bowel obstruction and Short bowel syndrome  Patient Measurements: Height: 5\' 9"  (175.3 cm) Weight: 109 kg (240 lb 4.8 oz) IBW/kg (Calculated) : 70.7 TPN AdjBW (KG): 80.3 Body mass index is 35.49 kg/m. Usual Weight: 104 kg at admission  Assessment: 89 y/o M admitted 8/25 for abdominal pain after eating breakfast, found with small bowel obstruction. Exlap performed 8/26, patient in continuity however large amount of bowel resected with ~120 cm remaining. Pharmacy consulted to initiate TPN.  Glucose / Insulin: CBG 118-147, A1c 6.5; 6 units SSI over 24h Electrolytes: Na 144, K 4.5, CoCa 10.5, Cl 112, CO2 24 Renal: Scr 1.16 (baseline Scr ~0.9), BUN 89 Hepatic: 9/5 AST/ALT wnl, Tbili 0.9, Albumin 2, TG 35 Intake / Output; MIVF: UOP 0.6 mL/kg/hr, LBM 9/3 (Type 6) GI Imaging: 8/29 Abd [place]: mild, no abnormal dilatation 8/31 Abd [place]: nonobstructive bowel gas pattern 9/2 Abd [place]: no acute findings GI Surgeries / Procedures:  No procedures since initiation of TPN  Central access: PICC  TPN start date: 8/27  Nutritional Goals: Goal concentrated TPN rate is 60 mL/hr (provides 103.6 g of protein and 1814 kcals per day)  RD Assessment: Estimated Needs Total Energy Estimated Needs: 1800-2000 kcal/d Total Protein Estimated Needs: 90-105g/d Total Fluid Estimated Needs: 1.8L/d  Current Nutrition:  TPN 9/4: Re-trial trickle TF  Plan:  Change to concentrated TPN at goal rate of 60 mL/hr at 1800 (provides 103.7 g protein, 1814 kcal), for 100% of estimated needs Electrolytes in TPN: Na 65 mEq/L, K 6 mEq/L, Ca 0 mEq/L, Mg 6 mEq/L, and Phos 10 mmol/L (decreased). Maximize acetate Continue standard MVI and trace elements to TPN Continue Moderate q4h SSI and adjust as needed  Monitor TPN labs on Mon/Thurs, and as needed Completed thiamine 100 mg IV x 5 days F/u if able to tolerate increasing TF  Eldridge Scot, PharmD,  BCCCP Clinical Pharmacist 03/08/2023, 9:26 AM

## 2023-03-08 NOTE — Progress Notes (Signed)
SLP Cancellation Note  Patient Details Name: Stephen Flynn MRN: [id number] DOB: Jun 13, 1928   Cancelled treatment:       Reason Eval/Treat Not Completed: Medical issues which prohibited therapy. Spoke with nursing.  Pt is unable to wean from vent.  At present, plan is for tracheostomy tomorrow.  SLP will sign off at this time.  Please place orders for PMV and swallow assessment when pt is medically ready if tracheostomy placement moves forward.  Reach out to [facility] SLP group via secure chat or by phone at Acute Rehab Dept [phone], if any needs arise.   Kerrie Pleasure, MA, CCC-SLP Acute Rehabilitation Services Office: [phone] 03/08/2023, 8:21 AM

## 2023-03-08 NOTE — Progress Notes (Signed)
ANTICOAGULATION CONSULT NOTE -Follow-up Consult  Pharmacy Consult for Heparin Indication: atrial fibrillation  Allergies  Allergen Reactions   Penicillin G     Other reaction(s): Drowsy   Penicillins Hives    Did it involve swelling of the face/tongue/throat, SOB, or low BP? No Did it involve sudden or severe rash/hives, skin peeling, or any reaction on the inside of your mouth or nose? yes Did you need to seek medical attention at a hospital or doctor's office? yes When did it last happen?  child     If all above answers are "NO", may proceed with cephalosporin use.    Patient Measurements: Height: 5\' 9"  (175.3 cm) Weight: 109 kg (240 lb 4.8 oz) IBW/kg (Calculated) : 70.7 Heparin Dosing Weight: 95 kg  Vital Signs: Temp: 98.3 F (36.8 C) (09/05 1928) Temp Source: Oral (09/05 1928) BP: 86/61 (09/05 2130) Pulse Rate: 86 (09/05 2130)  Labs: Recent Labs    03/06/23 0314 03/07/23 0436 03/07/23 0512 03/08/23 0603 03/08/23 1330 03/08/23 2200  HGB 7.1* 6.9* 7.0* 6.6* 7.6*  --   HCT 21.6* 19.9* 21.1* 20.4* 23.4*  --   PLT 227 210 219 230  --   --   APTT  --   --   --   --   --  69*  CREATININE 1.59*  --  1.28* 1.16  --   --     Estimated Creatinine Clearance: 48.4 mL/min (by C-G formula based on SCr of 1.16 mg/dL).  Assessment: 89 YOM s/p SBR on Eliqius for atrial fibrillation. Tracheostomy planned 9/9 - plan to hold Eliquis and start heparin until trach completed. Will use aPTT until levels are correlating. Will target low goal given borderline hemoglobin requiring intermittent transfusions.  aPTT therapeutic at 69 on current rate of 1300 units/hr.  Noted frank blood with suctioning - limiting suctioning and TXA nebs ordered for 24 hrs.  No other overt bleeding noted. H/H low stable.   Goal of Therapy:  Heparin level 0.3-0.5 units/ml aPTT 66-85 seconds Monitor platelets by anticoagulation protocol: Yes   Plan:  Start heparin without bolus at 1300 units/hr at  1200 aPTT and CBC at 0500 Daily CBC and heparin level/aPTT  Link Snuffer, PharmD, BCPS, BCCCP Please refer to [facility] for [facility] numbers 03/08/2023, 10:36 PM

## 2023-03-09 DIAGNOSIS — K56609 Unspecified intestinal obstruction, unspecified as to partial versus complete obstruction: Secondary | ICD-10-CM | POA: Diagnosis not present

## 2023-03-09 DIAGNOSIS — J9601 Acute respiratory failure with hypoxia: Secondary | ICD-10-CM

## 2023-03-09 LAB — GLUCOSE, CAPILLARY
Glucose-Capillary: 135 mg/dL — ABNORMAL HIGH (ref 70–99)
Glucose-Capillary: 134 mg/dL — ABNORMAL HIGH (ref 70–99)
Glucose-Capillary: 116 mg/dL — ABNORMAL HIGH (ref 70–99)
Glucose-Capillary: 142 mg/dL — ABNORMAL HIGH (ref 70–99)
Glucose-Capillary: 89 mg/dL (ref 70–99)
Glucose-Capillary: 86 mg/dL (ref 70–99)

## 2023-03-09 LAB — CBC
HCT: 20.4 % — ABNORMAL LOW (ref 39.0–52.0)
HCT: 22.1 % — ABNORMAL LOW (ref 39.0–52.0)
Hemoglobin: 6.6 g/dL — CL (ref 13.0–17.0)
Hemoglobin: 7.1 g/dL — ABNORMAL LOW (ref 13.0–17.0)
MCH: 30.4 pg (ref 26.0–34.0)
MCH: 29.6 pg (ref 26.0–34.0)
MCHC: 32.4 g/dL (ref 30.0–36.0)
MCHC: 32.1 g/dL (ref 30.0–36.0)
MCV: 94 fL (ref 80.0–100.0)
MCV: 92.1 fL (ref 80.0–100.0)
Platelets: 230 10*3/uL (ref 150–400)
Platelets: 244 10*3/uL (ref 150–400)
RBC: 2.17 MIL/uL — ABNORMAL LOW (ref 4.22–5.81)
RBC: 2.4 MIL/uL — ABNORMAL LOW (ref 4.22–5.81)
RDW: 16.2 % — ABNORMAL HIGH (ref 11.5–15.5)
RDW: 16.4 % — ABNORMAL HIGH (ref 11.5–15.5)
WBC: 5.8 10*3/uL (ref 4.0–10.5)
WBC: 6.4 10*3/uL (ref 4.0–10.5)
nRBC: 0 % (ref 0.0–0.2)
nRBC: 0 % (ref 0.0–0.2)

## 2023-03-09 LAB — APTT
aPTT: >200 s (ref 24–36)
aPTT: 82 s — ABNORMAL HIGH (ref 24–36)

## 2023-03-09 LAB — BASIC METABOLIC PANEL
Anion gap: 6 (ref 5–15)
BUN: 87 mg/dL — ABNORMAL HIGH (ref 8–23)
CO2: 27 mmol/L (ref 22–32)
Calcium: 8.7 mg/dL — ABNORMAL LOW (ref 8.9–10.3)
Chloride: 110 mmol/L (ref 98–111)
Creatinine, Ser: 1.15 mg/dL (ref 0.61–1.24)
GFR, Estimated: 59 mL/min — ABNORMAL LOW (ref >60)
Glucose, Bld: 140 mg/dL — ABNORMAL HIGH (ref 70–99)
Potassium: 4.4 mmol/L (ref 3.5–5.1)
Sodium: 143 mmol/L (ref 135–145)

## 2023-03-09 LAB — BPAM RBC
Blood Product Expiration Date: 202409292359
ISSUE DATE / TIME: 202409050809
Unit Type and Rh: 5100

## 2023-03-09 LAB — TYPE AND SCREEN
ABO/RH(D): O POS
Antibody Screen: NEGATIVE
Unit division: 0

## 2023-03-09 LAB — MAGNESIUM: Magnesium: 2.4 mg/dL (ref 1.7–2.4)

## 2023-03-09 LAB — HEPARIN LEVEL (UNFRACTIONATED)
Heparin Unfractionated: >1.1 [IU]/mL — ABNORMAL HIGH (ref 0.30–0.70)
Heparin Unfractionated: >1.1 [IU]/mL — ABNORMAL HIGH (ref 0.30–0.70)

## 2023-03-09 LAB — CULTURE, RESPIRATORY W GRAM STAIN

## 2023-03-09 LAB — PHOSPHORUS: Phosphorus: 3.7 mg/dL (ref 2.5–4.6)

## 2023-03-09 MED ORDER — HYDROMORPHONE HCL 1 MG/ML IJ SOLN
0.5000 mg | Freq: Three times a day (TID) | INTRAMUSCULAR | Status: DC | PRN
  Administered 2023-03-12 – 2023-03-17 (×8): 0.5 mg via INTRAVENOUS
  Filled 2023-03-09 (×9): qty 0.5

## 2023-03-09 MED ORDER — FUROSEMIDE 10 MG/ML IJ SOLN
40.0000 mg | Freq: Every day | INTRAMUSCULAR | Status: DC
  Administered 2023-03-09 – 2023-03-12 (×4): 40 mg via INTRAVENOUS
  Filled 2023-03-09 (×5): qty 4

## 2023-03-09 NOTE — Progress Notes (Signed)
[facility] Progress Note  11 Days Post-Op  Subjective: CC-  Awake on the vent.  TF at 50cc/hr. No n/v. Nonbloody BM yesterday.  Objective: Vital signs in last 24 hours: Temp:  [97.6 F (36.4 C)-99.9 F (37.7 C)] 99.9 F (37.7 C) (09/06 0732) Pulse Rate:  [45-105] 89 (09/06 0739) Resp:  [11-21] 20 (09/06 0739) BP: (64-156)/(41-110) 99/64 (09/06 0445) SpO2:  [79 %-100 %] 99 % (09/06 0740) FiO2 (%):  [30 %] 30 % (09/06 0740) Weight:  [109 kg-110.5 kg] 110.5 kg (09/06 0500) Last BM Date : 03/08/23  Intake/Output from previous day: 09/05 0701 - 09/06 0700 In: 3180 [I.V.:1808.8; Blood:367.5; NG/GT:803.8; IV Piggyback:199.9] Out: 1530 [Urine:1400; Emesis/NG output:80; Drains:50] Intake/Output this shift: No intake/output data recorded.  PE: Gen: Alert, NAD, on the vent HEENT: ETT, Cortrak with TF running at 50cc/hr Card:  A fib, regular rate Pulm:  mechanically ventilated Abd: Soft, mild distension, few bowel sounds heard, some left sided tenderness without rebound or guarding, vac to midline with good seal and serosanguinous fluid in canister  Lab Results:  Recent Labs    03/08/23 0603 03/08/23 1330 03/09/23 0419  WBC 5.8  --  6.4  HGB 6.6* 7.6* 7.1*  HCT 20.4* 23.4* 22.1*  PLT 230  --  244   BMET Recent Labs    03/08/23 0603 03/09/23 0419  NA 144 143  K 4.5 4.4  CL 112* 110  CO2 24 27  GLUCOSE 144* 140*  BUN 89* 87*  CREATININE 1.16 1.15  CALCIUM 8.9 8.7*   PT/INR No results for input(s): "LABPROT", "INR" in the last 72 hours. CMP     Component Value Date/Time   NA 143 03/09/2023 0419   K 4.4 03/09/2023 0419   CL 110 03/09/2023 0419   CO2 27 03/09/2023 0419   GLUCOSE 140 (H) 03/09/2023 0419   BUN 87 (H) 03/09/2023 0419   CREATININE 1.15 03/09/2023 0419   CALCIUM 8.7 (L) 03/09/2023 0419   PROT 5.3 (L) 03/08/2023 0603   ALBUMIN 2.0 (L) 03/08/2023 0603   AST 21 03/08/2023 0603   ALT 18 03/08/2023 0603   ALKPHOS 41 03/08/2023 0603    BILITOT 0.9 03/08/2023 0603   GFRNONAA 59 (L) 03/09/2023 0419   GFRAA >60 05/16/2019 1110   Lipase     Component Value Date/Time   LIPASE 16 02/01/2023 1433       Studies/Results: DG CHEST PORT 1 VIEW  Result Date: 03/08/2023 CLINICAL DATA:  Endotracheally intubated. Shortness of breath and chest pain. EXAM: PORTABLE CHEST 1 VIEW COMPARISON:  Radiograph yesterday. FINDINGS: Endotracheal tube tip 3.1 cm from the carina. Right central line tip at the atrial brachiocephalic confluence. Weighted enteric tube tip below the diaphragm not included in the field of view. Cardiomegaly again seen. Tortuous thoracic aorta, grossly stable allowing for differences in positioning. Interstitial thickening suspicious for pulmonary edema. Dense retrocardiac opacity and probable left pleural effusion. No pneumothorax. IMPRESSION: 1. Endotracheal tube tip 3.1 cm from the carina. Right central line tip at the atrial brachiocephalic confluence. 2. Cardiomegaly with interstitial thickening suspicious for pulmonary edema. 3. Dense retrocardiac opacity and probable left pleural effusion. Electronically Signed   By: Narda Rutherford M.D.   On: 03/08/2023 23:48   VAS [place] UPPER EXTREMITY VENOUS DUPLEX  Result Date: 03/07/2023 UPPER VENOUS STUDY  Patient Name:  AISHA OBERMANN  Date of Exam:   03/07/2023 Medical Rec #: [id number]      Accession #:    [id number] Date of Birth:  July 24, 1927       Patient Gender: M Patient Age:   89 years Exam Location:  [facility] Procedure:      VAS [place] UPPER EXTREMITY VENOUS DUPLEX Referring Phys: Levy Pupa --------------------------------------------------------------------------------  Indications: Edema Risk Factors: Immobility Surgery Laparotomy 02/15/2023 obesity. Anticoagulation: Heparin. Limitations: Bandages and Limited range of motion. Comparison Study: No prior study Performing Technologist: Shona Simpson  Examination Guidelines: A complete evaluation includes B-mode imaging, spectral  Doppler, color Doppler, and power Doppler as needed of all accessible portions of each vessel. Bilateral testing is considered an integral part of a complete examination. Limited examinations for reoccurring indications may be performed as noted.  Right Findings: +----------+------------+---------+-----------+----------+-------+ RIGHT     CompressiblePhasicitySpontaneousPropertiesSummary +----------+------------+---------+-----------+----------+-------+ IJV           Full       Yes       Yes                      +----------+------------+---------+-----------+----------+-------+ Subclavian    Full       Yes       Yes                      +----------+------------+---------+-----------+----------+-------+ Axillary      Full       Yes       Yes                      +----------+------------+---------+-----------+----------+-------+ Brachial      Full       Yes       Yes                      +----------+------------+---------+-----------+----------+-------+ Radial        Full       Yes       Yes                      +----------+------------+---------+-----------+----------+-------+ Ulnar         Full       Yes       Yes                      +----------+------------+---------+-----------+----------+-------+ Cephalic      Full       Yes       Yes                      +----------+------------+---------+-----------+----------+-------+ Basilic       Full       Yes       Yes                      +----------+------------+---------+-----------+----------+-------+  Left Findings: +----------+------------+---------+-----------+----------+---------------+ LEFT      CompressiblePhasicitySpontaneousProperties    Summary     +----------+------------+---------+-----------+----------+---------------+ IJV                      Yes       Yes              Patent by color +----------+------------+---------+-----------+----------+---------------+ Subclavian    Full        Yes       Yes                              +----------+------------+---------+-----------+----------+---------------+ Axillary  Full       Yes       Yes                              +----------+------------+---------+-----------+----------+---------------+ Brachial      Full       Yes       Yes                              +----------+------------+---------+-----------+----------+---------------+ Radial        Full       Yes       Yes                              +----------+------------+---------+-----------+----------+---------------+ Ulnar         Full       Yes       Yes                              +----------+------------+---------+-----------+----------+---------------+ Cephalic      Full       Yes       Yes                              +----------+------------+---------+-----------+----------+---------------+ Basilic       Full       Yes       Yes                              +----------+------------+---------+-----------+----------+---------------+  *See table(s) above for measurements and observations.  Diagnosing physician: Heath Lark Electronically signed by Heath Lark on 03/07/2023 at 6:23:33 PM.    Final     Anti-infectives: Anti-infectives (From admission, onward)    Start     Dose/Rate Route Frequency Ordered Stop   03/04/23 1000  meropenem (MERREM) 1 g in sodium chloride 0.9 % 100 mL IVPB        1 g 200 mL/hr over 30 Minutes Intravenous Every 12 hours 03/04/23 0855 03/11/23 0959   03/01/23 1500  ceFEPIme (MAXIPIME) 2 g in sodium chloride 0.9 % 100 mL IVPB        2 g 200 mL/hr over 30 Minutes Intravenous Every 12 hours 03/01/23 1124 03/02/23 0356   03/01/23 0300  ceFEPIme (MAXIPIME) 2 g in sodium chloride 0.9 % 100 mL IVPB  Status:  Discontinued        2 g 200 mL/hr over 30 Minutes Intravenous Every 24 hours 02/28/23 1141 03/01/23 1124   03-17-2023 1445  piperacillin-tazobactam (ZOSYN) IVPB 3.375 g  Status:  Discontinued         3.375 g 100 mL/hr over 30 Minutes Intravenous Every 8 hours 03/17/23 1436 Mar 17, 2023 1445   03-17-2023 1445  ceFEPIme (MAXIPIME) 2 g in sodium chloride 0.9 % 100 mL IVPB  Status:  Discontinued        2 g 200 mL/hr over 30 Minutes Intravenous Every 12 hours 17-Mar-2023 1446 02/28/23 1141   2023-03-17 1445  metroNIDAZOLE (FLAGYL) IVPB 500 mg        500 mg 100 mL/hr over 60 Minutes Intravenous Every 12 hours 2023-03-17 1446 03/02/23 1719   2023-03-17 1015  meropenem (MERREM) 1 g in sodium chloride  0.9 % 100 mL IVPB        1 g 200 mL/hr over 30 Minutes Intravenous  Once 02/28/2023 8119 03/02/2023 1743        Assessment/Plan Small bowel obstruction involving a medial adhesion across the distal jejunum  -POD#1 s/p Exploratory laparotomy small bowel resection and primary anastomosis 8/26 Dr. Luisa Hart - approximately 120 cm of small bowel remaining  - Reintubated 9/1. On meropenem for aspiration pneumonia. Discussed with CCM, considering extubation and if he fails will trach on Monday - TF at goal. D/c TPN - abdominal wound vac MWF    FEN - IVF, NPO, TF  VTE - holding eliquis for trach, on heparin gtt ID - completed 5d cefepime/flagyl postop on 8/30. Now on merrem for aspiration pneumonia Foley - out and voiding   AKI - Cr normalized ABL anemia - Hgb stable at 7.1 A fib RVR - On amiodarone, eliquis Wheelchair/walker bound HTN H/O CVA HLD Diastolic CHF DM    LOS: 12 days    Franne Forts, [facility] 03/09/2023, 8:01 AM Please see Amion for pager number during day hours 7:00am-4:30pm

## 2023-03-09 NOTE — Consult Note (Signed)
WOC Nurse wound follow up; this visit made with Jettie Pagan, PA CCS  Wound type: full thickness surgical  Measurement: see 03/07/2023 note  Wound bed: red moist  Drainage (amount, consistency, odor) minimal serosanguinous  Periwound: intact, cut and flattened  half of a barrier ring to cover dip in umbilicus  Dressing procedure/placement/frequency: Removed old NPWT dressing Cleansed wound with normal saline Filled wound with 1 piece of black foam  Sealed NPWT dressing at HG Patient received IV pain medication per bedside nurse prior to dressing change Patient tolerated procedure well  WOC nurse will continue to provide NPWT dressing changes due to the complexity of the dressing change, next change Monday 03/12/2023.  2 NPWT black foam kits in room.   Thank you,    Priscella Mann MSN, RN-BC, [employer] [phone]

## 2023-03-09 NOTE — Procedures (Signed)
Extubation Procedure Note  Patient Details:   Name: Stephen Flynn DOB: 1927-11-21 MRN: [id number]   Airway Documentation:    Vent end date: 03/09/23 Vent end time: 1015   Evaluation  O2 sats: stable throughout Complications: No apparent complications Patient did tolerate procedure well. Bilateral Breath Sounds: Clear, Diminished   Yes  Patient extubated per order to 4L [place] with no apparent complications. Positive cuff leak was noted prior to extubation. Patient is alert and is able to weakly speak. Vitals are stable. RT will continue to monitor.   Stephen Flynn 03/09/2023, 10:25 AM

## 2023-03-09 NOTE — Progress Notes (Signed)
ANTICOAGULATION CONSULT NOTE  Pharmacy Consult for Heparin Indication: atrial fibrillation  Allergies  Allergen Reactions   Penicillin G     Other reaction(s): Drowsy   Penicillins Hives    Did it involve swelling of the face/tongue/throat, SOB, or low BP? No Did it involve sudden or severe rash/hives, skin peeling, or any reaction on the inside of your mouth or nose? yes Did you need to seek medical attention at a hospital or doctor's office? yes When did it last happen?  child     If all above answers are "NO", may proceed with cephalosporin use.    Patient Measurements: Height: 5' 9.02" (175.3 cm) Weight: 110.5 kg (243 lb 9.7 oz) IBW/kg (Calculated) : 70.74 Heparin Dosing Weight: 95 kg  Vital Signs: Temp: 99.9 F (37.7 C) (09/06 0732) Temp Source: Oral (09/06 0732) BP: 93/64 (09/06 0800) Pulse Rate: 111 (09/06 0800)  Labs: Recent Labs    03/07/23 0512 03/08/23 0603 03/08/23 1330 03/08/23 2200 03/09/23 0419 03/09/23 0605  HGB 7.0* 6.6* 7.6*  --  7.1*  --   HCT 21.1* 20.4* 23.4*  --  22.1*  --   PLT 219 230  --   --  244  --   APTT  --   --   --  69* >200* 82*  HEPARINUNFRC  --   --   --   --  >1.10* >1.10*  CREATININE 1.28* 1.16  --   --  1.15  --     Estimated Creatinine Clearance: 49.2 mL/min (by C-G formula based on SCr of 1.15 mg/dL).   Medical History: Past Medical History:  Diagnosis Date   Anemia    Aortic insufficiency    Chest pain    H/O: GI bleed    Hyperlipidemia    Hypertension    Leg cramps    Stroke [facility])     Assessment: 89 YOM s/p SBR on Eliqius for atrial fibrillation. Tracheostomy planned 9/9 - plan to hold Eliquis and start heparin until trach completed.  aPTT >200 on first check - STAT repeat resulted with aPTT 82 (therapeutic) and heparin level >1.1 as expected with recent Eliquis use. RN reports no increase in bleeding. Only suctioning when necessary.  Goal of Therapy:  Heparin level 0.3-0.5 units/ml aPTT 66-85  seconds Monitor platelets by anticoagulation protocol: Yes   Plan:  Continue heparin at 1300 units/hr Check aPTT/heparin level daily until correlating F/u ability to transition back to Eliquis after tracheostomy  Eldridge Scot, PharmD, BCCCP Clinical Pharmacist 03/09/2023, 8:45 AM

## 2023-03-09 NOTE — Progress Notes (Signed)
Stephen Flynn, MRN:  [id number], DOB:  1928-02-01, LOS: 12 ADMISSION DATE:  02/18/2023, CONSULTATION DATE: 2023/03/03 REFERRING MD: Dr. Randol Kern, CHIEF COMPLAINT: Hypotension  History of Present Illness:  Patient is encephalopathic and/or intubated; therefore, history has been obtained from chart review.  89 year old male with past medical history as below, which is significant for atrial fibrillation on Eliquis, wheelchair-bound, HFpEF, and stroke who presented to [facility] emergency department on 8/25 with complaints of acute onset abdominal pain with no bowel movement for 3 days prior to presentation.  Also sounds like he had not been consistently taking his Eliquis.  Upon arrival to the emergency department the patient was noted to be in atrial fibrillation with RVR to the 120s.  Started on diltiazem infusion and placed on heparin drip.  CT of the abdomen and pelvis demonstrated small bowel obstruction and moderate ascites.  General surgery was consulted and recommended conservative management with NG tube decompression.  He was admitted to the hospitalist service.  In the a.m. hours of 03-03-2023 the patient developed waning mental status and became nonverbal.  He also developed hypotension with systolic pressures in the 70s and desaturation to the 80s.  His abdomen became more firm and rigid and exquisitely tender to pain.  The surgical team was at bedside and determined the patient would likely require urgent surgery.  PCCM was consulted for hypotension and hypoxia.  Pertinent  Medical History   has a past medical history of Anemia, Aortic insufficiency, Chest pain, H/O: GI bleed, Hyperlipidemia, Hypertension, Leg cramps, and Stroke (HCC).   Significant Hospital Events: Including procedures, antibiotic start and stop dates in addition to other pertinent events   8/25 admit for SBO 2023/03/03 acute abdomen, tx to ICU for pressors. PICC line placed. Underwent Ex lap and small bowel  resection 8/27 Extubated 8/27 Reintubated 9/1 9/1 bronchoscopy, BAL >> few yeast, identification pending 9/5 1u pRBC for Hgb 6.6 without signs of bleeding   9/6 extubated   Interim History / Subjective:  Tolerated PSV 9/5 Off Precedex and Norepinephrine, is more awake S Cr stable 1.16 > 1.15 Had adequate urine output yesterday  Non-bloody BM yesterday on bowel regimen  Objective   Blood pressure 110/85, pulse (!) 106, temperature 99.9 F (37.7 C), temperature source Oral, resp. rate 20, height 5' 9.02" (1.753 m), weight 110.5 kg, SpO2 99%.      Vent Mode: PSV;CPAP FiO2 (%):  [30 %-36 %] 36 % Set Rate:  [14 bmp] 14 bmp Vt Set:  [560 mL] 560 mL PEEP:  [5 cmH20] 5 cmH20 Pressure Support:  [5 cmH20] 5 cmH20 Plateau Pressure:  [10 cmH20-24 cmH20] 10 cmH20   Intake/Output Summary (Last 24 hours) at 03/09/2023 1032 Last data filed at 03/09/2023 0913 Gross per 24 hour  Intake 3098.41 ml  Output 1930 ml  Net 1168.41 ml   Filed Weights   03/08/23 0500 03/08/23 0830 03/09/23 0500  Weight: 109 kg 109 kg 110.5 kg    Examination: General: Thin elderly man, no distress, ventilated HEENT: ET tube in place, oropharynx otherwise clear Neuro: Opens eyes, tracks, follows some commands Chest: Scattered bilateral inspiratory crackles, no wheeze Heart: Irregularly irregular, distant, no murmur Abdomen: Wound VAC in place, nondistended with hypoactive bowel sounds Extremities: Left upper extremity edema. 1+ pitting edema bilateral lower extremities. SCDs in place Skin: No rash. Two serous blisters on left arm   Resolved Hospital Problem list     Assessment & Plan:  Small bowel obstruction  s/p ex lap and small bowel resection.  Acute abdomen -Appreciate CCS management -TF at goal rate of @50mL /hr -D/c TPN -Wound VAC changes as per CCS plans  Acute respiratory failure with hypoxia and hypercapnia secondary to aspiration pneumonia Sepsis with septic shock due to aspiration  pneumonia Acute septic encephalopathy -Off precedex, more awake and able to follow some commands. Mild cough on command.  -Extubate today  -Monitor respiratory status, may need to proceed with tracheostomy if unable to maintain airway and clear secretions  -Continue to hold Eliquis in case tracheostomy is needed. Will continue heparin  -Continue empiric meropenem until 9/7 for presumed aspiration pneumonia -Few yeast growing on BAL culture, identification pending - possibly a contaminant. Will continue to follow culture   Anemia  Hgb 7.1 s/p 1u pRBC on 9/5. No evidence of bleeding. Has a history of iron deficiency anemia.  -Follow CBC  Paroxysmal atrial fibrillation with rapid ventricular response Heart rate is controlled, remain in A-fib -Continue amiodarone -Holding Eliquis pre-procedure  -Continue heparin  Chronic HFrEF Ejection fraction is 40 to 45% Moderate LVH -Follow I's/O, exam notable for 1+ lower extremity edema  -Start IV Lasix 40 mg daily   AKI, likely due to dehydration, sepsis -Cr stable at 1.15, adequate urine output  -Following BMP -Renal dose medications, ensure adequate renal perfusion  Bilateral upper extremity edema  Doppler US of bilateral upper extremities on 9/4 negative for DVT   Diabetes mellitus, controlled Hemoglobin A1c 6.5 -CBG monitoring and sliding scale insulin as per protocol -CBG goal <180  Baseline debility: Wheelchair-bound -Family desires full scope of care.  Certainly his baseline status will impact his ability to recover from this as well as his QOL going forward  Moderate malnutrition -D/c TPN -TF at goal rate @50  mL/hr, continue to titrate -Appreciate dietitian assistance  Best Practice (right click and "Reselect all SmartList Selections" daily)   Diet/type: NPO  TF @50mL /hr DVT prophylaxis: Heparin GI prophylaxis: PPI Lines: N/A Foley:  N/A Code Status:  full code Last date of multidisciplinary goals of care discussion  [03/07/23: family updated, discussed options of short term support and one way extubation vs long term support and tracheostomy. Family okay to proceed with tracheostomy if needed]  Tempie Hoist, MS4

## 2023-03-09 NOTE — Progress Notes (Signed)
PHARMACY - TOTAL PARENTERAL NUTRITION CONSULT NOTE   Indication: Small bowel obstruction and Short bowel syndrome  Patient Measurements: Height: 5' 9.02" (175.3 cm) Weight: 110.5 kg (243 lb 9.7 oz) IBW/kg (Calculated) : 70.74 TPN AdjBW (KG): 80.3 Body mass index is 35.96 kg/m. Usual Weight: 104 kg at admission  Assessment: 89 y/o M admitted 8/25 for abdominal pain after eating breakfast, found with small bowel obstruction. Exlap performed 8/26, patient in continuity however large amount of bowel resected with ~120 cm remaining. Pharmacy consulted to initiate TPN.  Glucose / Insulin: CBG 118-147, A1c 6.5; 6 units SSI over 24h Electrolytes: Na 144, K 4.4, CoCa 10.3, Cl 110, CO2 27 Renal: Scr 1.15 (baseline Scr ~0.9), BUN 87 Hepatic: 9/5 AST/ALT wnl, Tbili 0.9, Albumin 2, TG 35 Intake / Output; MIVF: UOP 0.5 mL/kg/hr, LBM 9/5 GI Imaging: 8/29 Abd [place]: mild, no abnormal dilatation 8/31 Abd [place]: nonobstructive bowel gas pattern 9/2 Abd [place]: no acute findings GI Surgeries / Procedures:  No procedures since initiation of TPN  Central access: PICC  TPN start date: 8/27  Nutritional Goals: Goal concentrated TPN rate is 60 mL/hr (provides 103.6 g of protein and 1814 kcals per day)  RD Assessment: Estimated Needs Total Energy Estimated Needs: 1800-2000 kcal/d Total Protein Estimated Needs: 90-105g/d Total Fluid Estimated Needs: 1.8L/d  Current Nutrition:  TPN 9/4: Re-trial trickle TF  Plan:  Stop TPN after tonight's bag per surgery as TF are at goal and tolerating Please re-consult pharmacy if needing to restart TPN  Eldridge Scot, PharmD, BCCCP Clinical Pharmacist 03/09/2023, 8:41 AM

## 2023-03-09 NOTE — Progress Notes (Addendum)
eLink Physician-Brief Progress Note Patient Name: Stephen Flynn DOB: October 29, 1927 MRN: [id number]   Date of Service  03/09/2023  HPI/Events of Note  89 year old male who presented to the ICU after small bowel obstruction and ex lap with small bowel resection complicated by acute hypoxic respiratory failure and shock thought to be secondary to sepsis from aspiration pneumonia.  Extubated today, has a weak cough and unable to expectorate secretions. Mentating about the same as prior. No hypoxemia  eICU Interventions  Although he is tachypneic, continue observation for now.  Can continue NT suctioning as needed for the time being.  No indication for ABG or ventilatory support at this time.     Intervention Category Intermediate Interventions: Respiratory distress - evaluation and management  [employer] 03/09/2023, 8:27 PM

## 2023-03-10 DIAGNOSIS — J9601 Acute respiratory failure with hypoxia: Secondary | ICD-10-CM | POA: Diagnosis not present

## 2023-03-10 LAB — GLUCOSE, CAPILLARY
Glucose-Capillary: 100 mg/dL — ABNORMAL HIGH (ref 70–99)
Glucose-Capillary: 106 mg/dL — ABNORMAL HIGH (ref 70–99)
Glucose-Capillary: 117 mg/dL — ABNORMAL HIGH (ref 70–99)
Glucose-Capillary: 121 mg/dL — ABNORMAL HIGH (ref 70–99)
Glucose-Capillary: 110 mg/dL — ABNORMAL HIGH (ref 70–99)

## 2023-03-10 LAB — BASIC METABOLIC PANEL
Anion gap: 9 (ref 5–15)
BUN: 86 mg/dL — ABNORMAL HIGH (ref 8–23)
CO2: 25 mmol/L (ref 22–32)
Calcium: 8.8 mg/dL — ABNORMAL LOW (ref 8.9–10.3)
Chloride: 111 mmol/L (ref 98–111)
Creatinine, Ser: 1.14 mg/dL (ref 0.61–1.24)
GFR, Estimated: 60 mL/min — ABNORMAL LOW (ref >60)
Glucose, Bld: 109 mg/dL — ABNORMAL HIGH (ref 70–99)
Potassium: 4.7 mmol/L (ref 3.5–5.1)
Sodium: 145 mmol/L (ref 135–145)

## 2023-03-10 LAB — PHOSPHORUS: Phosphorus: 4.1 mg/dL (ref 2.5–4.6)

## 2023-03-10 LAB — CBC
HCT: 23 % — ABNORMAL LOW (ref 39.0–52.0)
Hemoglobin: 7.3 g/dL — ABNORMAL LOW (ref 13.0–17.0)
MCH: 30.2 pg (ref 26.0–34.0)
MCHC: 31.7 g/dL (ref 30.0–36.0)
MCV: 95 fL (ref 80.0–100.0)
Platelets: 240 10*3/uL (ref 150–400)
RBC: 2.42 MIL/uL — ABNORMAL LOW (ref 4.22–5.81)
RDW: 16.4 % — ABNORMAL HIGH (ref 11.5–15.5)
WBC: 6.6 10*3/uL (ref 4.0–10.5)
nRBC: 0 % (ref 0.0–0.2)

## 2023-03-10 LAB — APTT
aPTT: 120 s — ABNORMAL HIGH (ref 24–36)
aPTT: 117 seconds — ABNORMAL HIGH (ref 24–36)
aPTT: 88 seconds — ABNORMAL HIGH (ref 24–36)

## 2023-03-10 LAB — MAGNESIUM: Magnesium: 2.4 mg/dL (ref 1.7–2.4)

## 2023-03-10 LAB — HEPARIN LEVEL (UNFRACTIONATED): Heparin Unfractionated: >1.1 [IU]/mL — ABNORMAL HIGH (ref 0.30–0.70)

## 2023-03-10 MED ORDER — SCOPOLAMINE 1 MG/3DAYS TD PT72
1.0000 | MEDICATED_PATCH | TRANSDERMAL | Status: DC
  Administered 2023-03-10 – 2023-03-16 (×3): 1.5 mg via TRANSDERMAL
  Filled 2023-03-10 (×3): qty 1

## 2023-03-10 NOTE — Progress Notes (Signed)
ANTICOAGULATION CONSULT NOTE  Pharmacy Consult for Heparin Indication: atrial fibrillation  Allergies  Allergen Reactions   Penicillin G     Other reaction(s): Drowsy   Penicillins Hives    Did it involve swelling of the face/tongue/throat, SOB, or low BP? No Did it involve sudden or severe rash/hives, skin peeling, or any reaction on the inside of your mouth or nose? yes Did you need to seek medical attention at a hospital or doctor's office? yes When did it last happen?  child     If all above answers are "NO", may proceed with cephalosporin use.    Patient Measurements: Height: 5' 9.02" (175.3 cm) Weight: 105.8 kg (233 lb 4 oz) IBW/kg (Calculated) : 70.74 Heparin Dosing Weight: 95 kg  Vital Signs: Temp: 99.1 F (37.3 C) (09/07 1957) Temp Source: Axillary (09/07 1957) BP: 135/86 (09/07 2100) Pulse Rate: 62 (09/07 2100)  Labs: Recent Labs    03/08/23 0603 03/08/23 1330 03/08/23 2200 03/09/23 0419 03/09/23 0605 03/10/23 0205 03/10/23 0839 03/10/23 1800  HGB 6.6* 7.6*  --  7.1*  --  7.3*  --   --   HCT 20.4* 23.4*  --  22.1*  --  23.0*  --   --   PLT 230  --   --  244  --  240  --   --   APTT  --   --    < > >200* 82* 120* 117* 88*  HEPARINUNFRC  --   --   --  >1.10* >1.10* >1.10*  --   --   CREATININE 1.16  --   --  1.15  --  1.14  --   --    < > = values in this interval not displayed.    Estimated Creatinine Clearance: 48.5 mL/min (by C-G formula based on SCr of 1.14 mg/dL).   Medical History: Past Medical History:  Diagnosis Date   Anemia    Aortic insufficiency    Chest pain    H/O: GI bleed    Hyperlipidemia    Hypertension    Leg cramps    Stroke [facility])     Assessment: 89 YOM s/p SBR on Eliqius for atrial fibrillation. Tracheostomy planned 9/9 - plan to hold Eliquis and start heparin until trach completed.  aPTT prolonged at 120 this AM - RN states paused heparin but did not flush. H/H stable. Plts wnl. No further bleeding with  suctioning. Heparin level >1.1 remains as expected with recent Eliquis use.  Stat repeat aPTT (with pausing and flushing) >>repeat still >117.   PM f/u > aPTT now just barely above goal range.  No overt bleeding or complications noted.    Goal of Therapy:  Heparin level 0.3-0.5 units/ml aPTT 66-85 seconds Monitor platelets by anticoagulation protocol: Yes   Plan:  Decrease heparin slightly to 950 units/hr Repeat aPTT, heparin level and CBC with morning labs.  Reece Leader, Colon Flattery, BCCP Clinical Pharmacist  03/10/2023 9:21 PM   [facility] pharmacy phone numbers are listed on [email or web address]

## 2023-03-10 NOTE — Progress Notes (Signed)
ANTICOAGULATION CONSULT NOTE  Pharmacy Consult for Heparin Indication: atrial fibrillation  Allergies  Allergen Reactions   Penicillin G     Other reaction(s): Drowsy   Penicillins Hives    Did it involve swelling of the face/tongue/throat, SOB, or low BP? No Did it involve sudden or severe rash/hives, skin peeling, or any reaction on the inside of your mouth or nose? yes Did you need to seek medical attention at a hospital or doctor's office? yes When did it last happen?  child     If all above answers are "NO", may proceed with cephalosporin use.    Patient Measurements: Height: 5' 9.02" (175.3 cm) Weight: 105.8 kg (233 lb 4 oz) IBW/kg (Calculated) : 70.74 Heparin Dosing Weight: 95 kg  Vital Signs: Temp: 97.8 F (36.6 C) (09/07 0342) Temp Source: Oral (09/07 0342) BP: 120/99 (09/07 0600) Pulse Rate: 77 (09/07 0600)  Labs: Recent Labs    03/08/23 0603 03/08/23 1330 03/08/23 2200 03/09/23 0419 03/09/23 0605 03/10/23 0205  HGB 6.6* 7.6*  --  7.1*  --  7.3*  HCT 20.4* 23.4*  --  22.1*  --  23.0*  PLT 230  --   --  244  --  240  APTT  --   --    < > >200* 82* 120*  HEPARINUNFRC  --   --   --  >1.10* >1.10* >1.10*  CREATININE 1.16  --   --  1.15  --  1.14   < > = values in this interval not displayed.    Estimated Creatinine Clearance: 48.5 mL/min (by C-G formula based on SCr of 1.14 mg/dL).   Medical History: Past Medical History:  Diagnosis Date   Anemia    Aortic insufficiency    Chest pain    H/O: GI bleed    Hyperlipidemia    Hypertension    Leg cramps    Stroke [facility])     Assessment: 89 YOM s/p SBR on Eliqius for atrial fibrillation. Tracheostomy planned 9/9 - plan to hold Eliquis and start heparin until trach completed.  aPTT prolonged at 120 this AM - RN states paused heparin but did not flush. H/H stable. Plts wnl. No further bleeding with suctioning. Heparin level >1.1 remains as expected with recent Eliquis use.  Stat repeat aPTT (with  pausing and flushing) >>repeat still >117.   Goal of Therapy:  Heparin level 0.3-0.5 units/ml aPTT 66-85 seconds Monitor platelets by anticoagulation protocol: Yes   Plan:  Hold heparin x30 minutes. Then reduce heparin at 1000 units/hr Check aPTT/heparin level daily until correlating F/u ability to transition back to Eliquis after tracheostomy  Link Snuffer, PharmD, BCPS, BCCCP Please refer to [facility] for [facility] Pharmacy numbers 03/10/2023, 7:03 AM

## 2023-03-10 NOTE — Progress Notes (Signed)
NAMEBreckon Flynn, MRN:  [id number], DOB:  Dec 11, 1927, LOS: 13 ADMISSION DATE:  2023/03/09, CONSULTATION DATE: 8/26 REFERRING MD: Dr. Randol Kern, CHIEF COMPLAINT: Hypotension  History of Present Illness:  Patient is encephalopathic and/or intubated; therefore, history has been obtained from chart review.  89 year old male with past medical history as below, which is significant for atrial fibrillation on Eliquis, wheelchair-bound, HFpEF, and stroke who presented to [facility] emergency department on 03-09-2023 with complaints of acute onset abdominal pain with no bowel movement for 3 days prior to presentation.  Also sounds like he had not been consistently taking his Eliquis.  Upon arrival to the emergency department the patient was noted to be in atrial fibrillation with RVR to the 120s.  Started on diltiazem infusion and placed on heparin drip.  CT of the abdomen and pelvis demonstrated small bowel obstruction and moderate ascites.  General surgery was consulted and recommended conservative management with NG tube decompression.  He was admitted to the hospitalist service.  In the a.m. hours of 8/26 the patient developed waning mental status and became nonverbal.  He also developed hypotension with systolic pressures in the 70s and desaturation to the 80s.  His abdomen became more firm and rigid and exquisitely tender to pain.  The surgical team was at bedside and determined the patient would likely require urgent surgery.  PCCM was consulted for hypotension and hypoxia.  Pertinent  Medical History   has a past medical history of Anemia, Aortic insufficiency, Chest pain, H/O: GI bleed, Hyperlipidemia, Hypertension, Leg cramps, and Stroke (HCC).   Significant Hospital Events: Including procedures, antibiotic start and stop dates in addition to other pertinent events   03/09/23 admit for SBO 8/26 acute abdomen, tx to ICU for pressors. PICC line placed. Underwent Ex lap and small bowel  resection 8/27 Extubated 8/27 Reintubated 9/1 9/1 bronchoscopy, BAL >> few yeast, identification pending 9/5 1u pRBC for Hgb 6.6 without signs of bleeding   9/6 extubated   Interim History / Subjective:   Extubated 9/6 Has required some NTS, poor secretion management S Cr 1.14 I/O+ 21 L total Off Precedex and norepinephrine  Objective   Blood pressure 117/82, pulse 88, temperature 99 F (37.2 C), temperature source Axillary, resp. rate 18, height 5' 9.02" (1.753 m), weight 105.8 kg, SpO2 99%.      FiO2 (%):  [30 %-36 %] 32 %   Intake/Output Summary (Last 24 hours) at 03/10/2023 0759 Last data filed at 03/10/2023 0600 Gross per 24 hour  Intake 2418.71 ml  Output 1820 ml  Net 598.71 ml   Filed Weights   03/08/23 0830 03/09/23 0500 03/10/23 0500  Weight: 109 kg 110.5 kg 105.8 kg    Examination: General: Thin chronically ill-appearing man laying in bed in no distress HEENT: NG tube in place.  He has upper airway secretions that sound like pooling saliva.  No stridor.  Weak voice  Neuro: Eyes are open, tracks, communicating but very weak.  Follows commands but unable to cough on command Chest: Mostly clear, some scattered bilateral aspiratory crackles Heart: Irregularly irregular, distant, no murmur Abdomen: Wound VAC in place, hypoactive bowel sounds Extremities: 1-2+ bilateral upper extremity edema Skin: 2 serous blisters on the left arm  Resolved Hospital Problem list     Assessment & Plan:  Small bowel obstruction s/p ex lap and small bowel resection.  Acute abdomen -Appreciate CCS management -Tolerating TF 50 cc/h -TPN discontinued 9/6 -Wound VAC changes as per CCS  Acute respiratory failure with hypoxia and hypercapnia secondary to aspiration pneumonia, impaired airway protection Sepsis with septic shock due to aspiration pneumonia Acute septic encephalopathy -Push pulmonary hygiene postextubation.  Still appears to be at risk -If he requires reintubation then  we will proceed with tracheostomy as airway protection is the principal barrier -Trial scopolamine patch to see if we can decrease his saliva/secretion burden -On heparin, holding Eliquis in case tracheostomy needed -Empiric meropenem ends on 9/7 for aspiration pneumonia  Anemia  Hgb 7.1 s/p 1u pRBC on 9/5. No evidence of bleeding. Has a history of iron deficiency anemia.  -Following CBC  Paroxysmal atrial fibrillation with rapid ventricular response Heart rate is controlled, remain in A-fib -Continue amiodarone -Continue heparin infusion, back to Eliquis once we are certain he does not need procedures  Chronic HFrEF Ejection fraction is 40 to 45% Moderate LVH -Scheduled Lasix restarted on 9/6, 40 mg daily.  Follow renal function, I/O, hemodynamics  AKI, likely due to dehydration, sepsis -Follow BMP, urine output -Renal dose medications  Bilateral upper extremity edema  -Doppler ultrasound reassuring -Diuresis  Diabetes mellitus, controlled Hemoglobin A1c 6.5 -CBG monitoring, sliding scale insulin as ordered -CBG goal 140-180  Baseline debility: Wheelchair-bound -Family confirms that patient desires full scope of care.  Certainly his baseline status will impact his ability to recover from this as well as his QOL going forward  Moderate malnutrition -TPN discontinued 9/6 -Continue tube feeding at goal  Best Practice (right click and "Reselect all SmartList Selections" daily)   Diet/type: NPO  TF @50mL /hr DVT prophylaxis: Heparin GI prophylaxis: PPI Lines: N/A Foley:  N/A Code Status:  full code Last date of multidisciplinary goals of care discussion [03/07/23: family updated, discussed options of short term support and one way extubation vs long term support and tracheostomy. Family okay to proceed with tracheostomy if needed]   Independent critical care time:  Levy Pupa, MD, PhD 03/10/2023, 7:59 AM [facility] [phone] or if no  answer before 7:00PM call [phone] For any issues after 7:00PM please call eLink [phone]

## 2023-03-10 NOTE — Progress Notes (Signed)
12 Days Post-Op   Subjective/Chief Complaint: Awake, alert   Objective: Vital signs in last 24 hours: Temp:  [97.7 F (36.5 C)-99 F (37.2 C)] 99 F (37.2 C) (09/07 0750) Pulse Rate:  [76-240] 88 (09/07 0719) Resp:  [16-23] 18 (09/07 0719) BP: (72-142)/(48-114) 117/82 (09/07 0700) SpO2:  [90 %-100 %] 99 % (09/07 0719) FiO2 (%):  [32 %-36 %] 32 % (09/07 0719) Weight:  [105.8 kg] 105.8 kg (09/07 0500) Last BM Date : 03/09/23  Intake/Output from previous day: 09/06 0701 - 09/07 0700 In: 2418.7 [I.V.:1018.7; NG/GT:1200; IV Piggyback:200.1] Out: 1820 [Urine:1770; Drains:50] Intake/Output this shift: No intake/output data recorded.  Ab soft nontender nondistended vac in place  Lab Results:  Recent Labs    03/09/23 0419 03/10/23 0205  WBC 6.4 6.6  HGB 7.1* 7.3*  HCT 22.1* 23.0*  PLT 244 240   BMET Recent Labs    03/09/23 0419 03/10/23 0205  NA 143 145  K 4.4 4.7  CL 110 111  CO2 27 25  GLUCOSE 140* 109*  BUN 87* 86*  CREATININE 1.15 1.14  CALCIUM 8.7* 8.8*   PT/INR No results for input(s): "LABPROT", "INR" in the last 72 hours. ABG No results for input(s): "PHART", "HCO3" in the last 72 hours.  Invalid input(s): "PCO2", "PO2"  Studies/Results: DG CHEST PORT 1 VIEW  Result Date: 03/08/2023 CLINICAL DATA:  Endotracheally intubated. Shortness of breath and chest pain. EXAM: PORTABLE CHEST 1 VIEW COMPARISON:  Radiograph yesterday. FINDINGS: Endotracheal tube tip 3.1 cm from the carina. Right central line tip at the atrial brachiocephalic confluence. Weighted enteric tube tip below the diaphragm not included in the field of view. Cardiomegaly again seen. Tortuous thoracic aorta, grossly stable allowing for differences in positioning. Interstitial thickening suspicious for pulmonary edema. Dense retrocardiac opacity and probable left pleural effusion. No pneumothorax. IMPRESSION: 1. Endotracheal tube tip 3.1 cm from the carina. Right central line tip at the atrial  brachiocephalic confluence. 2. Cardiomegaly with interstitial thickening suspicious for pulmonary edema. 3. Dense retrocardiac opacity and probable left pleural effusion. Electronically Signed   By: Narda Rutherford M.D.   On: 03/08/2023 23:48    Anti-infectives: Anti-infectives (From admission, onward)    Start     Dose/Rate Route Frequency Ordered Stop   03/04/23 1000  meropenem (MERREM) 1 g in sodium chloride 0.9 % 100 mL IVPB        1 g 200 mL/hr over 30 Minutes Intravenous Every 12 hours 03/04/23 0855 03/11/23 0959   03/01/23 1500  ceFEPIme (MAXIPIME) 2 g in sodium chloride 0.9 % 100 mL IVPB        2 g 200 mL/hr over 30 Minutes Intravenous Every 12 hours 03/01/23 1124 03/02/23 0356   03/01/23 0300  ceFEPIme (MAXIPIME) 2 g in sodium chloride 0.9 % 100 mL IVPB  Status:  Discontinued        2 g 200 mL/hr over 30 Minutes Intravenous Every 24 hours 02/28/23 1141 03/01/23 1124   02/04/2023 1445  piperacillin-tazobactam (ZOSYN) IVPB 3.375 g  Status:  Discontinued        3.375 g 100 mL/hr over 30 Minutes Intravenous Every 8 hours 02/04/2023 1436 02/05/2023 1445   02/06/2023 1445  ceFEPIme (MAXIPIME) 2 g in sodium chloride 0.9 % 100 mL IVPB  Status:  Discontinued        2 g 200 mL/hr over 30 Minutes Intravenous Every 12 hours 02/08/2023 1446 02/28/23 1141   02/12/2023 1445  metroNIDAZOLE (FLAGYL) IVPB 500 mg  500 mg 100 mL/hr over 60 Minutes Intravenous Every 12 hours 03/22/2023 1446 03/02/23 1719   March 22, 2023 1015  meropenem (MERREM) 1 g in sodium chloride 0.9 % 100 mL IVPB        1 g 200 mL/hr over 30 Minutes Intravenous  Once Mar 22, 2023 0917 03/22/2023 1743       Assessment/Plan: -POD#12 s/p Exploratory laparotomy small bowel resection and primary anastomosis 2023-03-22 Dr. Luisa Hart - approximately 120 cm of small bowel remaining  - extubated yesterday, a lot of secretions, concern for aspiration, no PO as of now due to this - TF at goal. - abdominal wound vac MWF    FEN - IVF, NPO, TF  VTE -  holding eliquis, on heparin gtt ID - completed 5d cefepime/flagyl postop on 8/30. Now on merrem for aspiration pneumonia Foley - out and voiding   AKI - Cr normalized ABL anemia - Hgb stable at 7.3 A fib RVR - On amiodarone, eliquis Wheelchair/walker bound HTN H/O CVA HLD Diastolic CHF DM    Stephen Flynn 03/10/2023

## 2023-03-11 ENCOUNTER — Inpatient Hospital Stay (HOSPITAL_COMMUNITY): Payer: 59

## 2023-03-11 DIAGNOSIS — J9601 Acute respiratory failure with hypoxia: Secondary | ICD-10-CM | POA: Diagnosis not present

## 2023-03-11 LAB — GLUCOSE, CAPILLARY
Glucose-Capillary: 101 mg/dL — ABNORMAL HIGH (ref 70–99)
Glucose-Capillary: 113 mg/dL — ABNORMAL HIGH (ref 70–99)
Glucose-Capillary: 119 mg/dL — ABNORMAL HIGH (ref 70–99)
Glucose-Capillary: 84 mg/dL (ref 70–99)
Glucose-Capillary: 85 mg/dL (ref 70–99)
Glucose-Capillary: 89 mg/dL (ref 70–99)
Glucose-Capillary: 95 mg/dL (ref 70–99)

## 2023-03-11 LAB — CBC
HCT: 22.8 % — ABNORMAL LOW (ref 39.0–52.0)
Hemoglobin: 7.2 g/dL — ABNORMAL LOW (ref 13.0–17.0)
MCH: 30.5 pg (ref 26.0–34.0)
MCHC: 31.6 g/dL (ref 30.0–36.0)
MCV: 96.6 fL (ref 80.0–100.0)
Platelets: 238 10*3/uL (ref 150–400)
RBC: 2.36 MIL/uL — ABNORMAL LOW (ref 4.22–5.81)
RDW: 16.3 % — ABNORMAL HIGH (ref 11.5–15.5)
WBC: 5.8 10*3/uL (ref 4.0–10.5)
nRBC: 0 % (ref 0.0–0.2)

## 2023-03-11 LAB — BASIC METABOLIC PANEL
Anion gap: 6 (ref 5–15)
BUN: 82 mg/dL — ABNORMAL HIGH (ref 8–23)
CO2: 25 mmol/L (ref 22–32)
Calcium: 8.9 mg/dL (ref 8.9–10.3)
Chloride: 116 mmol/L — ABNORMAL HIGH (ref 98–111)
Creatinine, Ser: 1.14 mg/dL (ref 0.61–1.24)
GFR, Estimated: 60 mL/min — ABNORMAL LOW (ref >60)
Glucose, Bld: 125 mg/dL — ABNORMAL HIGH (ref 70–99)
Potassium: 4.6 mmol/L (ref 3.5–5.1)
Sodium: 147 mmol/L — ABNORMAL HIGH (ref 135–145)

## 2023-03-11 LAB — APTT: aPTT: 83 s — ABNORMAL HIGH (ref 24–36)

## 2023-03-11 LAB — HEPARIN LEVEL (UNFRACTIONATED): Heparin Unfractionated: 0.64 [IU]/mL (ref 0.30–0.70)

## 2023-03-11 MED ORDER — FREE WATER
200.0000 mL | Freq: Four times a day (QID) | Status: DC
  Administered 2023-03-12: 200 mL

## 2023-03-11 NOTE — Progress Notes (Addendum)
NAMERingo Flynn, MRN:  [id number], DOB:  [birth year], LOS: 14 ADMISSION DATE:  02/01/2023, CONSULTATION DATE: March 11, 2023 REFERRING MD: Dr. Randol Kern, CHIEF COMPLAINT: Hypotension  History of Present Illness:  Patient is encephalopathic and/or intubated; therefore, history has been obtained from chart review.  89 year old male with past medical history as below, which is significant for atrial fibrillation on Eliquis, wheelchair-bound, HFpEF, and stroke who presented to [facility] emergency department on 8/25 with complaints of acute onset abdominal pain with no bowel movement for 3 days prior to presentation.  Also sounds like he had not been consistently taking his Eliquis.  Upon arrival to the emergency department the patient was noted to be in atrial fibrillation with RVR to the 120s.  Started on diltiazem infusion and placed on heparin drip.  CT of the abdomen and pelvis demonstrated small bowel obstruction and moderate ascites.  General surgery was consulted and recommended conservative management with NG tube decompression.  He was admitted to the hospitalist service.  In the a.m. hours of Mar 11, 2023 the patient developed waning mental status and became nonverbal.  He also developed hypotension with systolic pressures in the 70s and desaturation to the 80s.  His abdomen became more firm and rigid and exquisitely tender to pain.  The surgical team was at bedside and determined the patient would likely require urgent surgery.  PCCM was consulted for hypotension and hypoxia.  Pertinent  Medical History   has a past medical history of Anemia, Aortic insufficiency, Chest pain, H/O: GI bleed, Hyperlipidemia, Hypertension, Leg cramps, and Stroke (HCC).   Significant Hospital Events: Including procedures, antibiotic start and stop dates in addition to other pertinent events   8/25 admit for SBO March 11, 2023 acute abdomen, tx to ICU for pressors. PICC line placed. Underwent Ex lap and small bowel  resection 8/27 Extubated 8/27 Reintubated 9/1 9/1 bronchoscopy, BAL >> few yeast, identification pending 9/5 1u pRBC for Hgb 6.6 without signs of bleeding   9/6 extubated   Interim History / Subjective:  Core track tube was pulled back, tube feeds held Creatinine stable 1.14 I/O+ 21 L total 1 L nasal cannula Off infusions 2 bowel movements reported overnight    Objective   Blood pressure (!) 144/89, pulse 88, temperature 98.3 F (36.8 C), temperature source Axillary, resp. rate 19, height 5' 9.02" (1.753 m), weight 102.4 kg, SpO2 95%.      FiO2 (%):  [3 %-28 %] 24 %   Intake/Output Summary (Last 24 hours) at 03/11/2023 0839 Last data filed at 03/11/2023 0600 Gross per 24 hour  Intake 1555.69 ml  Output 1675 ml  Net -119.31 ml   Filed Weights   03/09/23 0500 03/10/23 0500 03/11/23 0411  Weight: 110.5 kg 105.8 kg 102.4 kg    Examination: General: Thin ill-appearing man, comfortable HEENT: NG tube in place but has been pulled back significantly.  Fewer secretions and a stronger voice Neuro: Eyes open, tracks, able to communicate.  Very weak cough Chest: Mostly clear, some bibasilar inspiratory crackles Heart: Irregularly irregular, distant, no murmur Abdomen: Wound VAC in place, hypoactive bowel sounds Extremities: 1+ lower extremity edema Skin: 2 serous blisters on his left arm  Resolved Hospital Problem list     Assessment & Plan:  Small bowel obstruction s/p ex lap and small bowel resection.  Acute abdomen -Appreciate CCS management -Tube feeds were held because his NG tube was malpositioned, seems to be tolerating -Off TPN -Wound VAC as per CCS recommendations, changing on MWF  Acute respiratory failure with hypoxia and hypercapnia secondary to aspiration pneumonia, impaired airway protection Sepsis with septic shock due to aspiration pneumonia Acute septic encephalopathy -Pushing pulmonary hygiene postextubation, still some risk.  He is managing saliva and  secretions poorly with some discoordination and weak cough -If he required reintubation then would proceed with tracheostomy based on discussions with family -Secretions may be a little bit better with addition of scopolamine patch on 9/7 Disease -Meropenem ended on 9/7  Anemia  Hgb 7.1 s/p 1u pRBC on 9/5. No evidence of bleeding. Has a history of iron deficiency anemia.  -Following CBC  Paroxysmal atrial fibrillation with rapid ventricular response Heart rate is controlled, remain in A-fib -Continue amiodarone -Continue heparin infusion, back to Eliquis once we are certain he does not need procedures, once his NG tube is back in good position  Chronic HFrEF Ejection fraction is 40 to 45% Moderate LVH -Tolerating scheduled Lasix 40 mg daily, consider increase given his total body volume overload.  Need to be careful with renal function, hemodynamics  AKI, likely due to dehydration, sepsis.  Improving -Follow urine output, BMP -Renal dose medications  Hypernatremia, in the setting of diuresis -Start low-dose free water per NG tube  Bilateral upper extremity edema  -Doppler ultrasound reassuring -Diuresis as tolerated  Diabetes mellitus, controlled Hemoglobin A1c 6.5 -CBG monitoring, sliding scale insulin as ordered -CBG goal 140-180  Baseline debility: Wheelchair-bound -Family confirms that patient desires full scope of care.  Certainly his baseline status will impact his ability to recover from this as well as his QOL going forward  Moderate malnutrition -TPN discontinued 9/6 -Tube feeds held on 9/8 due to change in position of his NG tube.  Will work on getting tube into postpyloric position on 9/9.  Okay to use the tube for free water  Best Practice (right click and "Reselect all SmartList Selections" daily)   Diet/type: NPO  TF @50mL /hr DVT prophylaxis: Heparin GI prophylaxis: PPI Lines: N/A Foley:  N/A Code Status:  full code Last date of multidisciplinary goals  of care discussion [03/07/23: family updated, discussed options of short term support and one way extubation vs long term support and tracheostomy. Family okay to proceed with tracheostomy if needed]   Independent critical care time: NA  Levy Pupa, MD, PhD 03/11/2023, 8:39 AM [facility] [phone] or if no answer before 7:00PM call [phone] For any issues after 7:00PM please call eLink [phone]

## 2023-03-11 NOTE — Progress Notes (Signed)
Patient pulled cortrak from 92cm now at 73. Tube feeds stopped. MD notified and abd xray ordered.

## 2023-03-11 NOTE — Progress Notes (Signed)
13 Days Post-Op   Subjective/Chief Complaint: Cortrak pulled back, feeds off, otherwise no issues,having bowel function   Objective: Vital signs in last 24 hours: Temp:  [98.1 F (36.7 C)-99.1 F (37.3 C)] 98.3 F (36.8 C) (09/08 0742) Pulse Rate:  [62-90] 88 (09/08 0745) Resp:  [15-20] 19 (09/08 0630) BP: (100-152)/(73-110) 144/89 (09/08 0630) SpO2:  [89 %-100 %] 95 % (09/08 0630) FiO2 (%):  [3 %-28 %] 24 % (09/08 0745) Weight:  [102.4 kg] 102.4 kg (09/08 0411) Last BM Date : 03/09/23  Intake/Output from previous day: 09/07 0701 - 09/08 0700 In: 1618.3 [I.V.:316.6; NG/GT:1101.7; IV Piggyback:200.1] Out: 1775 [Urine:1750; Drains:25] Intake/Output this shift: No intake/output data recorded.  Abd soft vac in place  Lab Results:  Recent Labs    03/10/23 0205 03/11/23 0200  WBC 6.6 5.8  HGB 7.3* 7.2*  HCT 23.0* 22.8*  PLT 240 238   BMET Recent Labs    03/10/23 0205 03/11/23 0200  NA 145 147*  K 4.7 4.6  CL 111 116*  CO2 25 25  GLUCOSE 109* 125*  BUN 86* 82*  CREATININE 1.14 1.14  CALCIUM 8.8* 8.9   PT/INR No results for input(s): "LABPROT", "INR" in the last 72 hours. ABG No results for input(s): "PHART", "HCO3" in the last 72 hours.  Invalid input(s): "PCO2", "PO2"  Studies/Results: DG Abd Portable 1V  Result Date: 03/11/2023 CLINICAL DATA:  89 year old male status post nasogastric tube placement. EXAM: PORTABLE ABDOMEN - 1 VIEW COMPARISON:  Abdominal radiograph 03/05/2023. FINDINGS: Small bore feeding tube in position with tip in the proximal stomach. No nasogastric tube identified. Visualized bowel-gas pattern is unremarkable. IMPRESSION: Tip of feeding tube in the proximal stomach. Electronically Signed   By: Trudie Reed M.D.   On: 03/11/2023 07:20    Anti-infectives: Anti-infectives (From admission, onward)    Start     Dose/Rate Route Frequency Ordered Stop   03/04/23 1000  meropenem (MERREM) 1 g in sodium chloride 0.9 % 100 mL IVPB        1  g 200 mL/hr over 30 Minutes Intravenous Every 12 hours 03/04/23 0855 03/10/23 2132   03/01/23 1500  ceFEPIme (MAXIPIME) 2 g in sodium chloride 0.9 % 100 mL IVPB        2 g 200 mL/hr over 30 Minutes Intravenous Every 12 hours 03/01/23 1124 03/02/23 0356   03/01/23 0300  ceFEPIme (MAXIPIME) 2 g in sodium chloride 0.9 % 100 mL IVPB  Status:  Discontinued        2 g 200 mL/hr over 30 Minutes Intravenous Every 24 hours 02/28/23 1141 03/01/23 1124   03/01/2023 1445  piperacillin-tazobactam (ZOSYN) IVPB 3.375 g  Status:  Discontinued        3.375 g 100 mL/hr over 30 Minutes Intravenous Every 8 hours 03/03/2023 1436 03/03/2023 1445   02/27/2023 1445  ceFEPIme (MAXIPIME) 2 g in sodium chloride 0.9 % 100 mL IVPB  Status:  Discontinued        2 g 200 mL/hr over 30 Minutes Intravenous Every 12 hours 02/28/2023 1446 02/28/23 1141   02/22/2023 1445  metroNIDAZOLE (FLAGYL) IVPB 500 mg        500 mg 100 mL/hr over 60 Minutes Intravenous Every 12 hours 02/07/2023 1446 03/02/23 1719   02/28/2023 1015  meropenem (MERREM) 1 g in sodium chloride 0.9 % 100 mL IVPB        1 g 200 mL/hr over 30 Minutes Intravenous  Once 02/16/2023 0917 02/23/2023 1743  Assessment/Plan: -POD#13 s/p Exploratory laparotomy small bowel resection and primary anastomosis 8/26 Dr. Luisa Hart - approximately 120 cm of small bowel remaining, if starts having a lot of bms may need immodium at some point - secretions better, prior  concern for aspiration, no PO until ready from CCM standpoint - TF off now, likely wait until tube postpyloric unless ok with CCM to feed in stomach - abdominal wound vac MWF    FEN - IVF, NPO, TF  VTE - holding eliquis, on heparin gtt ID - completed 5d cefepime/flagyl postop on 8/30. Now on merrem for aspiration pneumonia   AKI - Cr normalized ABL anemia - Hgb stable at 7.3 A fib RVR - On amiodarone, eliquis Wheelchair/walker bound HTN H/O CVA HLD Diastolic CHF DM   Stephen Flynn 03/11/2023

## 2023-03-11 NOTE — Plan of Care (Signed)
  Problem: Coping: Goal: Ability to adjust to condition or change in health will improve Outcome: Progressing   Problem: Fluid Volume: Goal: Ability to maintain a balanced intake and output will improve Outcome: Progressing   Problem: Health Behavior/Discharge Planning: Goal: Ability to identify and utilize available resources and services will improve Outcome: Progressing   Problem: Health Behavior/Discharge Planning: Goal: Ability to manage health-related needs will improve Outcome: Progressing   Problem: Metabolic: Goal: Ability to maintain appropriate glucose levels will improve Outcome: Progressing   Problem: Nutritional: Goal: Maintenance of adequate nutrition will improve Outcome: Progressing   Problem: Nutritional: Goal: Progress toward achieving an optimal weight will improve Outcome: Progressing   Problem: Skin Integrity: Goal: Risk for impaired skin integrity will decrease Outcome: Progressing

## 2023-03-11 NOTE — Progress Notes (Signed)
ANTICOAGULATION CONSULT NOTE  Pharmacy Consult for Heparin Indication: atrial fibrillation  Allergies  Allergen Reactions   Penicillin G     Other reaction(s): Drowsy   Penicillins Hives    Did it involve swelling of the face/tongue/throat, SOB, or low BP? No Did it involve sudden or severe rash/hives, skin peeling, or any reaction on the inside of your mouth or nose? yes Did you need to seek medical attention at a hospital or doctor's office? yes When did it last happen?  child     If all above answers are "NO", may proceed with cephalosporin use.    Patient Measurements: Height: 5' 9.02" (175.3 cm) Weight: 102.4 kg (225 lb 12 oz) IBW/kg (Calculated) : 70.74 Heparin Dosing Weight: 95 kg  Vital Signs: Temp: 98.1 F (36.7 C) (09/08 0414) Temp Source: Axillary (09/08 0414) BP: 144/89 (09/08 0630) Pulse Rate: 82 (09/08 0630)  Labs: Recent Labs    03/09/23 0419 03/09/23 0605 03/10/23 0205 03/10/23 0839 03/10/23 1800 03/11/23 0200 03/11/23 0415  HGB 7.1*  --  7.3*  --   --  7.2*  --   HCT 22.1*  --  23.0*  --   --  22.8*  --   PLT 244  --  240  --   --  238  --   APTT >200* 82* 120* 117* 88*  --  83*  HEPARINUNFRC >1.10* >1.10* >1.10*  --   --   --  0.64  CREATININE 1.15  --  1.14  --   --  1.14  --     Estimated Creatinine Clearance: 47.8 mL/min (by C-G formula based on SCr of 1.14 mg/dL).   Medical History: Past Medical History:  Diagnosis Date   Anemia    Aortic insufficiency    Chest pain    H/O: GI bleed    Hyperlipidemia    Hypertension    Leg cramps    Stroke [facility])     Assessment: Stephen Flynn s/p SBR on Eliqius for atrial fibrillation. Tracheostomy planned 9/9 - plan to hold Eliquis and start heparin until trach completed.  aPTT therapeutic for goal. Heparin level starting to trend down though still not fully correlating. Hgb low-stable. Platelets within normal limits.   Goal of Therapy:  Heparin level 0.3-0.5 units/ml aPTT 66-85 seconds Monitor  platelets by anticoagulation protocol: Yes   Plan:  Continue heparin slightly to 950 units/hr Repeat aPTT, heparin level and CBC with morning labs.  Link Snuffer, PharmD, BCPS, BCCCP Please refer to [facility] for [facility] Pharmacy numbers  03/11/2023 7:08 AM

## 2023-03-12 ENCOUNTER — Inpatient Hospital Stay (HOSPITAL_COMMUNITY): Payer: 59

## 2023-03-12 DIAGNOSIS — J9601 Acute respiratory failure with hypoxia: Secondary | ICD-10-CM | POA: Diagnosis not present

## 2023-03-12 DIAGNOSIS — K56609 Unspecified intestinal obstruction, unspecified as to partial versus complete obstruction: Secondary | ICD-10-CM | POA: Diagnosis not present

## 2023-03-12 LAB — BASIC METABOLIC PANEL
Anion gap: 6 (ref 5–15)
BUN: 70 mg/dL — ABNORMAL HIGH (ref 8–23)
CO2: 28 mmol/L (ref 22–32)
Calcium: 9.2 mg/dL (ref 8.9–10.3)
Chloride: 116 mmol/L — ABNORMAL HIGH (ref 98–111)
Creatinine, Ser: 1.06 mg/dL (ref 0.61–1.24)
GFR, Estimated: >60 mL/min (ref >60)
Glucose, Bld: 87 mg/dL (ref 70–99)
Potassium: 4.3 mmol/L (ref 3.5–5.1)
Sodium: 150 mmol/L — ABNORMAL HIGH (ref 135–145)

## 2023-03-12 LAB — GLUCOSE, CAPILLARY
Glucose-Capillary: 76 mg/dL (ref 70–99)
Glucose-Capillary: 75 mg/dL (ref 70–99)
Glucose-Capillary: 85 mg/dL (ref 70–99)
Glucose-Capillary: 61 mg/dL — ABNORMAL LOW (ref 70–99)
Glucose-Capillary: 101 mg/dL — ABNORMAL HIGH (ref 70–99)
Glucose-Capillary: 62 mg/dL — ABNORMAL LOW (ref 70–99)

## 2023-03-12 LAB — CBC
HCT: 24 % — ABNORMAL LOW (ref 39.0–52.0)
Hemoglobin: 7.4 g/dL — ABNORMAL LOW (ref 13.0–17.0)
MCH: 30.6 pg (ref 26.0–34.0)
MCHC: 30.8 g/dL (ref 30.0–36.0)
MCV: 99.2 fL (ref 80.0–100.0)
Platelets: 240 10*3/uL (ref 150–400)
RBC: 2.42 MIL/uL — ABNORMAL LOW (ref 4.22–5.81)
RDW: 16.3 % — ABNORMAL HIGH (ref 11.5–15.5)
WBC: 4.5 10*3/uL (ref 4.0–10.5)
nRBC: 0 % (ref 0.0–0.2)

## 2023-03-12 LAB — HEPARIN LEVEL (UNFRACTIONATED)
Heparin Unfractionated: 0.57 [IU]/mL (ref 0.30–0.70)
Heparin Unfractionated: 0.38 [IU]/mL (ref 0.30–0.70)

## 2023-03-12 LAB — APTT: aPTT: 90 s — ABNORMAL HIGH (ref 24–36)

## 2023-03-12 MED ORDER — FREE WATER
200.0000 mL | Status: DC
  Administered 2023-03-12 – 2023-03-15 (×14): 200 mL

## 2023-03-12 MED ORDER — HEPARIN (PORCINE) 25000 UT/250ML-% IV SOLN
1350.0000 [IU]/h | INTRAVENOUS | Status: DC
  Administered 2023-03-12 – 2023-03-13 (×2): 850 [IU]/h via INTRAVENOUS
  Administered 2023-03-14: 1000 [IU]/h via INTRAVENOUS
  Administered 2023-03-15: 1100 [IU]/h via INTRAVENOUS
  Administered 2023-03-17 – 2023-03-19 (×2): 1350 [IU]/h via INTRAVENOUS
  Filled 2023-03-12 (×8): qty 250

## 2023-03-12 MED ORDER — DEXTROSE 50 % IV SOLN
12.5000 g | INTRAVENOUS | Status: AC
  Administered 2023-03-12: 12.5 g via INTRAVENOUS

## 2023-03-12 MED ORDER — DEXTROSE 50 % IV SOLN
INTRAVENOUS | Status: AC
  Filled 2023-03-12: qty 50

## 2023-03-12 MED ORDER — APIXABAN 5 MG PO TABS: 5.0000 mg | ORAL_TABLET | Freq: Two times a day (BID) | ORAL | Status: DC

## 2023-03-12 NOTE — Progress Notes (Signed)
Inpatient Rehab Admissions Coordinator:   Per therapy recommendations, patient was screened for CIR candidacy by Clemens Catholic, MS, CCC-SLP . At this time, Pt. is not at a level to tolerate the intensity of CIR; however,   Pt. may have potential to progress to becoming a potential CIR candidate, so CIR admissions team will follow and monitor for progress and participation with therapies and place consult order if Pt. appears to be an appropriate candidate. Please contact me with any questions.    Clemens Catholic, [place], [place] Admissions Coordinator  [phone] ([place]) [phone] (office)

## 2023-03-12 NOTE — Progress Notes (Addendum)
ANTICOAGULATION CONSULT NOTE  Pharmacy Consult for Heparin Indication: atrial fibrillation  Allergies  Allergen Reactions   Penicillin G     Other reaction(s): Drowsy   Penicillins Hives    Did it involve swelling of the face/tongue/throat, SOB, or low BP? No Did it involve sudden or severe rash/hives, skin peeling, or any reaction on the inside of your mouth or nose? yes Did you need to seek medical attention at a hospital or doctor's office? yes When did it last happen?  child     If all above answers are "NO", may proceed with cephalosporin use.    Patient Measurements: Height: 5' 9.02" (175.3 cm) Weight: 103.2 kg (227 lb 8.2 oz) IBW/kg (Calculated) : 70.74 Heparin Dosing Weight: 95 kg  Vital Signs: Temp: 98.1 F (36.7 C) (09/09 0759) Temp Source: Axillary (09/09 0759) BP: 123/88 (09/09 0700) Pulse Rate: 87 (09/09 0700)  Labs: Recent Labs    03/10/23 0205 03/10/23 0839 03/10/23 1800 03/11/23 0200 03/11/23 0415 03/12/23 0430  HGB 7.3*  --   --  7.2*  --  7.4*  HCT 23.0*  --   --  22.8*  --  24.0*  PLT 240  --   --  238  --  240  APTT 120*   < > 88*  --  83* 90*  HEPARINUNFRC >1.10*  --   --   --  0.64 0.57  CREATININE 1.14  --   --  1.14  --  1.06   < > = values in this interval not displayed.    Estimated Creatinine Clearance: 51.5 mL/min (by C-G formula based on SCr of 1.06 mg/dL).   Medical History: Past Medical History:  Diagnosis Date   Anemia    Aortic insufficiency    Chest pain    H/O: GI bleed    Hyperlipidemia    Hypertension    Leg cramps    Stroke [facility])     Assessment: 89 YOM s/p SBR on Eliqius for atrial fibrillation. Tracheostomy planned 9/9 - plan to hold Eliquis and start heparin until trach completed.  aPTT slightly above goal 90. Heparin level slightly above goal at 0.57. Levels are correlating - will follow heparin levels from here on.   Hgb low-stable. Platelets within normal limits.  *Trying to advance feeding tube post  pyloric due to reflex - unable to give apixaban post pyloric due to decrease absorption. Will continue heparin for now.   Goal of Therapy:  Heparin level 0.3-0.5 units/ml aPTT 66-85 seconds Monitor platelets by anticoagulation protocol: Yes   Plan:  Reduce heparin slightly to 850 units/hr Recheck heparin level in 6 hrs Repeat aPTT, heparin level and CBC with morning labs.  Link Snuffer, PharmD, BCPS, BCCCP Please refer to [facility] for [facility] Pharmacy numbers  03/12/2023 9:01 AM   Addendum: Clarification received- tube to remain in stomach. Ok to transition to [employer].   Plan: Stop Heparin when give first dose of Eliquis.  Start Eliquis 5mg  po BID.  Monitor CBC and for signs and symptoms of bleeding.    Further Addendum: Surgery requesting post-pyloric placement. Only 120 cm small bowel and decreased absorption of Eliquis in small bowel.   Plan: Continue IV Heparin at 850 units/hr. RN has not yet stopped. No Eliquis given.  Check heparin level in 6 hours.

## 2023-03-12 NOTE — Progress Notes (Signed)
Occupational Therapy Treatment Patient Details Name: Stephen Flynn MRN: [id number] DOB: 11-13-1927 Today's Date: 03/12/2023   History of present illness 89 y/o male admitted 8/25 with abdominal pain, SOB with bowel obstruction. 8/26 developed hypotension and required ex-lap with small bowel resection, remained intubated post op. Extubated 8/27. Reintubated 9/1. PMhx: GIB, stroke, HTN, Afib, neuropathy, CHF, obesity   OT comments  Pt is not making functional progress towards their acute OT goals; pt required more assist for cognition, attention and communication. Pt seen with PT to safely address functional mobility and ROM. Overall he remains total A +2 for rolling L&R and session was completed with pt in chair position in bed. Pt with significant L inattention and required max stimulation/cues to briefly attend to the L environment. Pt also followed <10% of visual cues, 0 verbal cues and no accurate functional communication with mumbled unintelligible speech throughout. He was resistive to passive BUE movement, but moved the RUE to internal cues (scratching nose). Pt's LUE is extremely edematous and pt only moving extremity minimally. OT to continue to follow acutely to facilitate progress towards established goals. Pt will continue to benefit from intensive inpatient follow up therapy, >3 hours/day after discharge with hopes pt will progress acutely.  RN made aware of acute difference in attention and communication as compared to evaluation.        If plan is discharge home, recommend the following:  Two people to help with walking and/or transfers;Two people to help with bathing/dressing/bathroom;Assistance with cooking/housework;Assistance with feeding;Direct supervision/assist for medications management;Direct supervision/assist for financial management;Assist for transportation;Help with stairs or ramp for entrance   Equipment Recommendations  Other (comment)       Precautions / Restrictions  Precautions Precautions: Fall;Other (comment) Precaution Comments: cortrak, wound vac Restrictions Weight Bearing Restrictions: No       Mobility Bed Mobility Overal bed mobility: Needs Assistance Bed Mobility: Rolling Rolling: Total assist Sidelying to sit: Total assist, +2 for physical assistance, +2 for safety/equipment       General bed mobility comments: poor initiation of movement    Transfers                   General transfer comment: NT         ADL either performed or assessed with clinical judgement   ADL Overall ADL's : Needs assistance/impaired     Grooming: Total assistance;Bed level Grooming Details (indicate cue type and reason): attmpted face washing, pt resistant to movement. Unable to reach L/R UEs to face                               General ADL Comments: pt remains total A for ADLs at bed level - impiared cognition, L inattention, weakness    Extremity/Trunk Assessment Upper Extremity Assessment Upper Extremity Assessment: RUE deficits/detail;LUE deficits/detail RUE Deficits / Details: resistive to most movement. moves to internal stim only; attempted to reach to scratch nose but unable to lift fully against gravity. good strength when resisting MMT assessment - seemingly painful? RUE Coordination: decreased fine motor;decreased gross motor LUE Deficits / Details: Extremely edematous, poor attention to L. minimal movement against gravity LUE Coordination: decreased fine motor;decreased gross motor   Lower Extremity Assessment Lower Extremity Assessment: Defer to PT evaluation        Vision   Vision Assessment?: Vision impaired- to be further tested in functional context Additional Comments: unable to assess   Perception Perception Perception: Not  tested   Praxis Praxis Praxis: Not tested    Cognition Arousal: Alert Behavior During Therapy: Flat affect Overall Cognitive Status: Difficult to assess                   General Comments VSS on RA, skin tear noted on L forearm    Pertinent Vitals/ Pain       Pain Assessment Pain Assessment: Faces Faces Pain Scale: Hurts little more Pain Location: abdomen, BUE with movement Pain Descriptors / Indicators: Discomfort, Sore Pain Intervention(s): Limited activity within patient's tolerance, Monitored during session         Frequency  Min 1X/week        Progress Toward Goals  OT Goals(current goals can now be found in the care plan section)  Progress towards OT goals: Not progressing toward goals - comment (increased assist for cog and attention)  Acute Rehab OT Goals Patient Stated Goal: unable to state OT Goal Formulation: With patient Time For Goal Achievement: 03/22/23 Potential to Achieve Goals: Good ADL Goals Pt Will Perform Grooming: with contact guard assist;sitting Pt Will Perform Upper Body Bathing: with contact guard assist;sitting Pt Will Perform Lower Body Bathing: with max assist;sitting/lateral leans;sit to/from stand Pt Will Transfer to Toilet: with max assist;with +2 assist Additional ADL Goal #1: Pt will perform bed mobility with min A as a precursor to ADL Additional ADL Goal #2: Pt will follow all one step commands without need for additional cueing.  Plan      Co-evaluation    PT/OT/SLP Co-Evaluation/Treatment: Yes Reason for Co-Treatment: Complexity of the patient's impairments (multi-system involvement);For patient/therapist safety   OT goals addressed during session: ADL's and self-care;Strengthening/ROM      AM-PAC OT "6 Clicks" Daily Activity     Outcome Measure   Help from another person eating meals?: Total Help from another person taking care of personal grooming?: Total Help from another person toileting, which includes using toliet, bedpan, or urinal?: Total Help from another person bathing (including washing, rinsing, drying)?: Total Help from another person to put on and taking off regular  upper body clothing?: Total Help from another person to put on and taking off regular lower body clothing?: Total 6 Click Score: 6    End of Session    OT Visit Diagnosis: Unsteadiness on feet (R26.81);Muscle weakness (generalized) (M62.81);Other symptoms and signs involving cognitive function   Activity Tolerance Patient tolerated treatment well   Patient Left in bed;with call bell/phone within reach;with bed alarm set   Nurse Communication Mobility status;Other (comment) (recommend head scan due to drastic change in cognitive status, communication and L inattention)        Time: 5366-4403 OT Time Calculation (min): 30 min  Charges: OT General Charges $OT Visit: 1 Visit OT Treatments $Therapeutic Activity: 8-22 mins  Derenda Mis, OTR/L Acute Rehabilitation Services Office [phone] Secure Chat Communication Preferred   [name] 03/12/2023, 11:07 AM

## 2023-03-12 NOTE — Progress Notes (Signed)
Physical Therapy Treatment Patient Details Name: Stephen Flynn MRN: [id number] DOB: [birth year] Today's Date: 03/12/2023   History of Present Illness 89 y/o male admitted 8/25 with abdominal pain, SOB with bowel obstruction. 8/26 developed hypotension and required ex-lap with small bowel resection, remained intubated post op. Extubated 8/27. Reintubated 9/1. PMhx: GIB, stroke, HTN, Afib, neuropathy, CHF, obesity    PT Comments  Pt with slow/poor progress towards acute goals this session, requiring increased assist for all mobility, cognition, attention and communication. Pt seen with OT for focus on progression of functional mobility and ROM with pt requiring total A+2 to roll R/L and unable to follow commands this session to progress to EOB/OOB mobility. Pt with fair tolerance for positioning in chair position in bed, however pt with poor tolerance for LE ROM exercises due to pain and poor initiation. Pt with noted L inattention throughout session needing max cues to attend/look to L throughout session. Current plan remains appropriate to address deficits and maximize functional independence and decrease caregiver burden. Pt continues to benefit from skilled PT services to progress toward functional mobility goals.    RN made aware of acute difference in attention and communication as compared to evaluation.     If plan is discharge home, recommend the following: Two people to help with walking and/or transfers;Two people to help with bathing/dressing/bathroom;Assist for transportation;Assistance with cooking/housework   Can travel by private vehicle        Equipment Recommendations  Other (comment) (TBD)    Recommendations for Other Services       Precautions / Restrictions Precautions Precautions: Fall;Other (comment) Precaution Comments: cortrak, wound vac Restrictions Weight Bearing Restrictions: No     Mobility  Bed Mobility Overal bed mobility: Needs Assistance Bed Mobility:  Rolling Rolling: Total assist Sidelying to sit: Total assist, +2 for physical assistance, +2 for safety/equipment       General bed mobility comments: poor initiation of movement    Transfers                   General transfer comment: NT    Ambulation/Gait                   Stairs             Wheelchair Mobility     Tilt Bed    Modified Rankin (Stroke Patients Only)       Balance                                            Cognition Arousal: Alert Behavior During Therapy: Flat affect Overall Cognitive Status: Difficult to assess                                          Exercises General Exercises - Lower Extremity Ankle Circles/Pumps: AROM, Both, 5 reps, Supine Heel Slides: PROM, AAROM, Both, 5 reps, Supine Hip ABduction/ADduction: PROM, Both, 5 reps, Supine    General Comments General comments (skin integrity, edema, etc.): VSS on 1L O2, skin tear noted on L forearm      Pertinent Vitals/Pain Pain Assessment Pain Assessment: Faces Faces Pain Scale: Hurts little more Pain Location: abdomen, BUE with movement Pain Descriptors / Indicators: Discomfort, Sore Pain Intervention(s): Monitored during session, Limited activity within patient's  tolerance    Home Living                          Prior Function            PT Goals (current goals can now be found in the care plan section) Acute Rehab PT Goals PT Goal Formulation: With patient/family Time For Goal Achievement: 03/22/23 Progress towards PT goals: Not progressing toward goals - comment (cognition)    Frequency    Min 1X/week      PT Plan      Co-evaluation PT/OT/SLP Co-Evaluation/Treatment: Yes Reason for Co-Treatment: Complexity of the patient's impairments (multi-system involvement);For patient/therapist safety PT goals addressed during session: Mobility/safety with mobility;Strengthening/ROM OT goals addressed  during session: ADL's and self-care;Strengthening/ROM      AM-PAC PT "6 Clicks" Mobility   Outcome Measure  Help needed turning from your back to your side while in a flat bed without using bedrails?: Total Help needed moving from lying on your back to sitting on the side of a flat bed without using bedrails?: Total Help needed moving to and from a bed to a chair (including a wheelchair)?: Total Help needed standing up from a chair using your arms (e.g., wheelchair or bedside chair)?: Total Help needed to walk in hospital room?: Total Help needed climbing 3-5 steps with a railing? : Total 6 Click Score: 6    End of Session Equipment Utilized During Treatment: Oxygen Activity Tolerance: Patient limited by fatigue;Other (comment) (limited by cognition) Patient left: in bed;with call bell/phone within reach;with bed alarm set Nurse Communication: Mobility status PT Visit Diagnosis: Other abnormalities of gait and mobility (R26.89);Pain;Muscle weakness (generalized) (M62.81) Pain - Right/Left: Right Pain - part of body: Leg     Time: 1610-9604 PT Time Calculation (min) (ACUTE ONLY): 30 min  Charges:    $Therapeutic Activity: 8-22 mins PT General Charges $$ ACUTE PT VISIT: 1 Visit                     Aarav Burgett R. PTA Acute Rehabilitation Services Office: [phone]   Catalina Antigua 03/12/2023, 1:02 PM

## 2023-03-12 NOTE — Progress Notes (Signed)
Nutrition Follow-up  DOCUMENTATION CODES:  Non-severe (moderate) malnutrition in context of chronic illness  INTERVENTION:  Restart tube feeding via cortrak tube: Osmolite 1.5 at 50 ml/h (1200 ml per day) Restart at 34mL/hr and then being advancing by 10mL q12h to goal Prosource TF20 60 ml 1x/d Provides 1880 kcal, 95 gm protein, 914 ml free water daily Due to the length of remaining bowel pt is at high risk for short bowel syndrome and will likely require TPN for the remainder of his life. Will monitor for signs of malabsorptive state  NUTRITION DIAGNOSIS:  Moderate Malnutrition related to chronic illness as evidenced by mild fat depletion, severe muscle depletion. -ongoing  GOAL:  Patient will meet greater than or equal to 90% of their needs - progressing, TF to be restarted when tube advanced  MONITOR:  I & O's, Vent status, Labs, Weight trends  REASON FOR ASSESSMENT:  Consult Enteral/tube feeding initiation and management (gradual advancement as tolerated)  ASSESSMENT:  Pt with hx of HTN, HLD, CVA, CHF, DM type 2, atrial fibrillation, and AAA presented to ED with abdominal pain. Imaging showed a SBO. Initially treated conservatively but ultimately developed hypotension and was transferred to ICU and then taken to OR for repair.  8/25 - admitted with SBO 8/26 - Op, exploratory laparotomy with bowel resection, Small bowel was resected from the distal jejunum to the distal ileum. ~120cm of remaining bowel. PICC placed 8/27 - TPN initiated, extubated 8/28 - wound vac placed 8/30 - cortrak placed (extreme distal stomach or proximal duodenum); TF started at trickle rate 8/31 - TF increased to 71mL/hr 9/1 - Re-intubated; TF increased to 86mL/hr 9/2 - TF held due to distension and no bowel function 9/4 - TF started at trickle rate  9/6 - extubated  Patient working with therapy at the time of assessment. Discussed in rounds, RN reports that pt pulled cortrak tube from 93cm to 63cm  yesterday. XR shows that tube is still in the proximal stomach. Surgery team requesting that tube be advanced post-pyloric due to pt's respiratory status. Cortrak order placed. Discussed with surgery team that prior to TF being held because of distention, tube has never been post-pyloric and pt was tolerating feeds at goal. CCM ok with gastric feeds. Surgery team still requests tube be advanced post-pyloric.   Pt has very little absorptive surface left in his small bowel (120cm of bowel remaining) and would benefit from tube being as proximal in the GI tract as possible. Pt at high risk for short gut syndrome. TPN discontinued but pt is at high risk for requiring parenteral support. Will monitor labs for signs of malabsorption.   Intake/Output Summary (Last 24 hours) at 03/12/2023 1401 Last data filed at 03/12/2023 1300 Gross per 24 hour  Intake 390.95 ml  Output 2010 ml  Net -1619.05 ml  Net IO Since Admission: 18,453.65 mL [03/12/23 1401]  Nutritionally Relevant Medications: Scheduled Meds:  docusate  100 mg Per Tube BID   feeding supplement (PROSource TF20)  60 mL Per Tube Daily   free water  200 mL Per Tube Q4H   insulin aspart  0-15 Units Subcutaneous Q4H   polyethylene glycol  17 g Per Tube Daily   Continuous Infusions:  feeding supplement (OSMOLITE 1.5 CAL) Stopped (03/11/23 0326)   Labs Reviewed: Na 150, chloride 116 BUN 70 CBG ranges from 75-119 mg/dL over the last 24 hours HgbA1c 6.5  Diet Order:   Diet Order     None  EDUCATION NEEDS:  Not appropriate for education at this time  Skin:  Skin Assessment: Reviewed RN Assessment (surgical incision, midline)  Last BM:  9/8 - type 6  Height:  Ht Readings from Last 1 Encounters:  03/09/23 5' 9.02" (1.753 m)   Weight:  Wt Readings from Last 1 Encounters:  03/12/23 103.2 kg   Ideal Body Weight:  72.7 kg  BMI:  Body mass index is 33.58 kg/m.  Estimated Nutritional Needs:  Kcal:  1800-2000 kcal/d Protein:   90-105g/d Fluid:  1.8L/d   Stephen Flynn, RD, LDN Clinical Dietitian RD pager # available in AMION  After hours/weekend pager # available in [facility]

## 2023-03-12 NOTE — Progress Notes (Signed)
ANTICOAGULATION CONSULT NOTE  Pharmacy Consult for Heparin Indication: atrial fibrillation  Allergies  Allergen Reactions   Penicillin G     Other reaction(s): Drowsy   Penicillins Hives    Did it involve swelling of the face/tongue/throat, SOB, or low BP? No Did it involve sudden or severe rash/hives, skin peeling, or any reaction on the inside of your mouth or nose? yes Did you need to seek medical attention at a hospital or doctor's office? yes When did it last happen?  child     If all above answers are "NO", may proceed with cephalosporin use.    Patient Measurements: Height: 5' 9.02" (175.3 cm) Weight: 103.2 kg (227 lb 8.2 oz) IBW/kg (Calculated) : 70.74 Heparin Dosing Weight: 95 kg  Vital Signs: Temp: 98.2 F (36.8 C) (09/09 1113) Temp Source: Axillary (09/09 1113) BP: 132/98 (09/09 1100) Pulse Rate: 79 (09/09 1417)  Labs: Recent Labs    03/10/23 0205 03/10/23 0839 03/10/23 1800 03/11/23 0200 03/11/23 0415 03/12/23 0430 03/12/23 1756  HGB 7.3*  --   --  7.2*  --  7.4*  --   HCT 23.0*  --   --  22.8*  --  24.0*  --   PLT 240  --   --  238  --  240  --   APTT 120*   < > 88*  --  83* 90*  --   HEPARINUNFRC >1.10*  --   --   --  0.64 0.57 0.38  CREATININE 1.14  --   --  1.14  --  1.06  --    < > = values in this interval not displayed.    Estimated Creatinine Clearance: 51.5 mL/min (by C-G formula based on SCr of 1.06 mg/dL).   Medical History: Past Medical History:  Diagnosis Date   Anemia    Aortic insufficiency    Chest pain    H/O: GI bleed    Hyperlipidemia    Hypertension    Leg cramps    Stroke [facility])     Assessment: 89 YOM s/p SBR on Eliqius for atrial fibrillation. Tracheostomy planned 9/9 - plan to hold Eliquis and start heparin until trach completed.  Heparin level therapeutic this PM at 0.37  Goal of Therapy:  Heparin level 0.3-0.5 units/ml aPTT 66-85 seconds Monitor platelets by anticoagulation protocol: Yes   Plan:  Continue  heparin at  850 units/hr Heparin level and CBC with morning labs. Planning Eliquis   Thank you Okey Regal, PharmD   03/12/2023 7:34 PM

## 2023-03-12 NOTE — Progress Notes (Signed)
14 Days Post-Op   Subjective/Chief Complaint: NAEO.  TF remain off as cortrak was accidentally pulled back into the stomach over weekend, need to be advanced post-pyloric.  Getting resp therapy currently, VAC just changed by [facility] RN.  Per bedside RN patient has having regular bowel function but does not believe he is having constant BMs/diarrhea.    Objective: Vital signs in last 24 hours: Temp:  [96.9 F (36.1 C)-99 F (37.2 C)] 98.1 F (36.7 C) (09/09 0759) Pulse Rate:  [71-88] 87 (09/09 0700) Resp:  [12-24] 13 (09/09 0700) BP: (102-136)/(58-98) 123/88 (09/09 0700) SpO2:  [87 %-100 %] 100 % (09/09 0700) Weight:  [103.2 kg] 103.2 kg (09/09 0500) Last BM Date : 03/11/23  Intake/Output from previous day: 09/08 0701 - 09/09 0700 In: 477.4 [I.V.:477.4] Out: 2310 [Urine:2260; Drains:50] Intake/Output this shift: No intake/output data recorded. Alert, NAD Cortrak in R nare. Clamped. Abd soft vac in place - per WOC RN wound is shallow and beefy red.   Lab Results:  Recent Labs    03/11/23 0200 03/12/23 0430  WBC 5.8 4.5  HGB 7.2* 7.4*  HCT 22.8* 24.0*  PLT 238 240   BMET Recent Labs    03/11/23 0200 03/12/23 0430  NA 147* 150*  K 4.6 4.3  CL 116* 116*  CO2 25 28  GLUCOSE 125* 87  BUN 82* 70*  CREATININE 1.14 1.06  CALCIUM 8.9 9.2   PT/INR No results for input(s): "LABPROT", "INR" in the last 72 hours. ABG No results for input(s): "PHART", "HCO3" in the last 72 hours.  Invalid input(s): "PCO2", "PO2"  Studies/Results: DG Abd Portable 1V  Result Date: 03/11/2023 CLINICAL DATA:  89 year old male status post nasogastric tube placement. EXAM: PORTABLE ABDOMEN - 1 VIEW COMPARISON:  Abdominal radiograph 03/05/2023. FINDINGS: Small bore feeding tube in position with tip in the proximal stomach. No nasogastric tube identified. Visualized bowel-gas pattern is unremarkable. IMPRESSION: Tip of feeding tube in the proximal stomach. Electronically Signed   By: Trudie Reed M.D.   On: 03/11/2023 07:20    Anti-infectives: Anti-infectives (From admission, onward)    Start     Dose/Rate Route Frequency Ordered Stop   03/04/23 1000  meropenem (MERREM) 1 g in sodium chloride 0.9 % 100 mL IVPB        1 g 200 mL/hr over 30 Minutes Intravenous Every 12 hours 03/04/23 0855 03/10/23 2132   03/01/23 1500  ceFEPIme (MAXIPIME) 2 g in sodium chloride 0.9 % 100 mL IVPB        2 g 200 mL/hr over 30 Minutes Intravenous Every 12 hours 03/01/23 1124 03/02/23 0356   03/01/23 0300  ceFEPIme (MAXIPIME) 2 g in sodium chloride 0.9 % 100 mL IVPB  Status:  Discontinued        2 g 200 mL/hr over 30 Minutes Intravenous Every 24 hours 02/28/23 1141 03/01/23 1124   02/17/2023 1445  piperacillin-tazobactam (ZOSYN) IVPB 3.375 g  Status:  Discontinued        3.375 g 100 mL/hr over 30 Minutes Intravenous Every 8 hours 02/02/2023 1436 02/20/2023 1445   02/19/2023 1445  ceFEPIme (MAXIPIME) 2 g in sodium chloride 0.9 % 100 mL IVPB  Status:  Discontinued        2 g 200 mL/hr over 30 Minutes Intravenous Every 12 hours 02/09/2023 1446 02/28/23 1141   02/03/2023 1445  metroNIDAZOLE (FLAGYL) IVPB 500 mg        500 mg 100 mL/hr over 60 Minutes Intravenous Every 12 hours  02/14/2023 1446 03/02/23 1719   02/05/2023 1015  meropenem (MERREM) 1 g in sodium chloride 0.9 % 100 mL IVPB        1 g 200 mL/hr over 30 Minutes Intravenous  Once 02/27/2023 0917 02/12/2023 1743       Assessment/Plan: -POD#14 s/p Exploratory laparotomy small bowel resection and primary anastomosis 8/26 Dr. Luisa Hart - approximately 120 cm of small bowel remaining, if starts having a lot of bms may need immodium at some point - secretions better, prior concern for aspiration, no PO until ready from CCM standpoint - TF off now, cortrak team to re-advance today and then resume tube feeding. Spoke with bedside RN who plans to call coretrak team.  - abdominal wound vac MWF, may be able to D/C VAC soon, wound getting more superficial.    FEN  - IVF, NPO, TF once cortrak advanced; hypernatremia per primary team, Na 150 VTE - holding eliquis, on heparin gtt ID - completed 5d cefepime/flagyl postop on 8/30. Was on merrem for aspiration, stopped 9/7   AKI - Cr normalized ABL anemia - Hgb stable at 7.4 A fib RVR - On amiodarone, eliquis Wheelchair/walker bound HTN H/O CVA HLD Diastolic CHF DM   Adam Phenix PA-C 03/12/2023

## 2023-03-12 NOTE — Progress Notes (Signed)
NAMEQusai Flynn, MRN:  [id number], DOB:  Nov 14, 1927, LOS: 15 ADMISSION DATE:  02/15/2023, CONSULTATION DATE: 03-03-2023 REFERRING MD: Dr. Randol Kern, CHIEF COMPLAINT: Hypotension  History of Present Illness:  Patient is encephalopathic and/or intubated; therefore, history has been obtained from chart review.  89 year old male with past medical history as below, which is significant for atrial fibrillation on Eliquis, wheelchair-bound, HFpEF, and stroke who presented to [facility] emergency department on 8/25 with complaints of acute onset abdominal pain with no bowel movement for 3 days prior to presentation.  Also sounds like he had not been consistently taking his Eliquis.  Upon arrival to the emergency department the patient was noted to be in atrial fibrillation with RVR to the 120s.  Started on diltiazem infusion and placed on heparin drip.  CT of the abdomen and pelvis demonstrated small bowel obstruction and moderate ascites.  General surgery was consulted and recommended conservative management with NG tube decompression.  He was admitted to the hospitalist service.  In the a.m. hours of Mar 03, 2023 the patient developed waning mental status and became nonverbal.  He also developed hypotension with systolic pressures in the 70s and desaturation to the 80s.  His abdomen became more firm and rigid and exquisitely tender to pain.  The surgical team was at bedside and determined the patient would likely require urgent surgery.  PCCM was consulted for hypotension and hypoxia.  Pertinent  Medical History   has a past medical history of Anemia, Aortic insufficiency, Chest pain, H/O: GI bleed, Hyperlipidemia, Hypertension, Leg cramps, and Stroke (HCC).   Significant Hospital Events: Including procedures, antibiotic start and stop dates in addition to other pertinent events   8/25 admit for SBO 03/03/2023 acute abdomen, tx to ICU for pressors. PICC line placed. Underwent Ex lap and small bowel  resection 8/27 Extubated 8/27 Reintubated 9/1 9/1 bronchoscopy, BAL >> few yeast, identification pending 9/5 1u pRBC for Hgb 6.6 without signs of bleeding   9/6 extubated   Interim History / Subjective:  No overnight issues Remain on 1 L nasal cannula oxygen Continues to have weak cough and gag   Objective   Blood pressure 123/88, pulse 87, temperature 98.1 F (36.7 C), temperature source Axillary, resp. rate 13, height 5' 9.02" (1.753 m), weight 103.2 kg, SpO2 100%.          Intake/Output Summary (Last 24 hours) at 03/12/2023 0904 Last data filed at 03/12/2023 0600 Gross per 24 hour  Intake 438.43 ml  Output 2050 ml  Net -1611.57 ml   Filed Weights   03/10/23 0500 03/11/23 0411 03/12/23 0500  Weight: 105.8 kg 102.4 kg 103.2 kg    Examination: General: Chronically ill-appearing male, lying on the bed HEENT: [place]/AT, eyes anicteric.  moist mucus membranes Neuro: Sleepy, opens eyes with vocal stimuli, following commands, very weak cough and gag Chest: Reduced air entry at the bases bilaterally, no wheezes or rhonchi Heart: Regular rate and rhythm, no murmurs or gallops Abdomen: Soft, nontender, wound VAC in place Skin: No rash  Labs and images reviewed  Resolved Hospital Problem list     Assessment & Plan:  Small bowel obstruction s/p ex lap and small bowel resection.  Acute abdomen, resolved Appreciate CCS follow-up Restart tube feeds Off TPN now Wound VAC as per CCS recommendations, changing on MWF  Acute respiratory failure with hypoxia and hypercapnia secondary to aspiration pneumonia Sepsis with septic shock due to aspiration pneumonia, resolved Acute septic encephalopathy, improving Patient was extubated on  9/6 Currently on 1 L nasal cannula oxygen Unfortunately he does not have a strong cough and gag Encourage incentive spirometry If he requires intubation again, we will proceed with tracheostomy based on discussion with patient's family Continue  scopolamine patch Completed antibiotic therapy with meropenem Mental status is slowly improving  Anemia, due to critical illness Hgb 7.1 s/p 1u pRBC on 9/5. No evidence of bleeding. Has a history of iron deficiency anemia.  Monitor H&H and transfuse if less than 7  Paroxysmal atrial fibrillation with rapid ventricular response Heart rate is controlled, remain in A-fib Continue amiodarone per tube Continue heparin infusion  Chronic HFrEF Ejection fraction is 40 to 45% Moderate LVH Continue Lasix 40 mg daily  AKI, likely due to dehydration, sepsis.  Improving Serum creatinine continue to improve Monitor intake and output Avoid nephrotoxic agent  Hypernatremia, in the setting of diuresis Increase free water flushes to 200 cc every 4 hours  Bilateral upper extremity edema  Continue diuresis  Diabetes mellitus, controlled Hemoglobin A1c 6.5 CBG monitoring, sliding scale insulin as ordered CBG goal 140-180  Baseline debility: Wheelchair-bound Family confirms that patient desires full scope of care.  Certainly his baseline status will impact his ability to recover from this as well as his QOL going forward  Moderate malnutrition TPN discontinued 9/6 Restart tube feeds  Best Practice (right click and "Reselect all SmartList Selections" daily)   Diet/type: NPO  TF DVT prophylaxis: Systemic heparin GI prophylaxis: N/A Lines: N/A Foley:  N/A Code Status:  full code Last date of multidisciplinary goals of care discussion [03/07/23: family updated, discussed options of short term support and one way extubation vs long term support and tracheostomy. Family okay to proceed with tracheostomy if needed]    Cheri Fowler, MD [facility] See Amion for pager If no response to pager, please call [phone] until 7pm After 7pm, Please call E-link [phone]

## 2023-03-12 NOTE — Consult Note (Addendum)
WOC Nurse follow-up consult Note: Reason for Consult: Abd Vac dressing changed. Pt is on the ventilator and was sleeping prior to the procedure and tolerated with minimal amt discomfort.  Applied one piece black foam and 1/2 barrier ring to wound edge to attempt to maintain a seal. Cont suction on at . Abd full thickness wound beefy red and moist, mod amt pink drainage in the cannister. Refer to previous progress notes for measurements.  Next dressing change to occur on Wed.  Thank-you,  Cammie Mcgee MSN, RN, CWOCN, [place], CNS [phone]

## 2023-03-12 NOTE — Procedures (Addendum)
Cortrak  Person Inserting Tube:  Early Steel T, RD Tube Type:  Cortrak - 55 inches Tube Size:  10 Tube Location:  Left nare Secured by: Bridle Technique Used to Measure Tube Placement:  Marking at nare/corner of mouth Cortrak Secured At:  105 cm   Cortrak Tube Team Note:  Consult received to re-advance a Cortrak feeding tube that had been pulled out some. Patient transferred rooms and stylet did not transfer with patient so unable to trace existing tube. Therefore, previous Cortrak tube removed and a new Cortrak tube placed.   X-ray is required, abdominal x-ray has been ordered by the Cortrak team. Please confirm tube placement before using the Cortrak tube.   If the tube becomes dislodged please keep the tube and contact the Cortrak team at [email or web address] for replacement.  If after hours and replacement cannot be delayed, place a NG tube and confirm placement with an abdominal x-ray.    Shelle Iron RD, LDN For contact information, refer to [facility].

## 2023-03-13 DIAGNOSIS — I482 Chronic atrial fibrillation, unspecified: Secondary | ICD-10-CM

## 2023-03-13 DIAGNOSIS — K56609 Unspecified intestinal obstruction, unspecified as to partial versus complete obstruction: Secondary | ICD-10-CM | POA: Diagnosis not present

## 2023-03-13 DIAGNOSIS — Z7189 Other specified counseling: Secondary | ICD-10-CM | POA: Diagnosis not present

## 2023-03-13 DIAGNOSIS — I420 Dilated cardiomyopathy: Secondary | ICD-10-CM | POA: Diagnosis not present

## 2023-03-13 DIAGNOSIS — J9601 Acute respiratory failure with hypoxia: Secondary | ICD-10-CM | POA: Diagnosis not present

## 2023-03-13 DIAGNOSIS — Z515 Encounter for palliative care: Secondary | ICD-10-CM

## 2023-03-13 LAB — CBC
HCT: 24.3 % — ABNORMAL LOW (ref 39.0–52.0)
Hemoglobin: 7.4 g/dL — ABNORMAL LOW (ref 13.0–17.0)
MCH: 29.7 pg (ref 26.0–34.0)
MCHC: 30.5 g/dL (ref 30.0–36.0)
MCV: 97.6 fL (ref 80.0–100.0)
Platelets: 251 10*3/uL (ref 150–400)
RBC: 2.49 MIL/uL — ABNORMAL LOW (ref 4.22–5.81)
RDW: 16.1 % — ABNORMAL HIGH (ref 11.5–15.5)
WBC: 4.3 10*3/uL (ref 4.0–10.5)
nRBC: 0 % (ref 0.0–0.2)

## 2023-03-13 LAB — GLUCOSE, CAPILLARY
Glucose-Capillary: 101 mg/dL — ABNORMAL HIGH (ref 70–99)
Glucose-Capillary: 104 mg/dL — ABNORMAL HIGH (ref 70–99)
Glucose-Capillary: 106 mg/dL — ABNORMAL HIGH (ref 70–99)
Glucose-Capillary: 109 mg/dL — ABNORMAL HIGH (ref 70–99)
Glucose-Capillary: 131 mg/dL — ABNORMAL HIGH (ref 70–99)
Glucose-Capillary: 135 mg/dL — ABNORMAL HIGH (ref 70–99)
Glucose-Capillary: 98 mg/dL (ref 70–99)

## 2023-03-13 LAB — HEPARIN LEVEL (UNFRACTIONATED): Heparin Unfractionated: 0.35 [IU]/mL (ref 0.30–0.70)

## 2023-03-13 NOTE — Progress Notes (Signed)
Patient sleeping to do flutter valve

## 2023-03-13 NOTE — Consult Note (Signed)
Consultation Note Date: 03/13/2023   Patient Name: Stephen Flynn  DOB: 1927/09/20  MRN: [id number]  Age / Sex: 89 y.o., male  PCP: Stephen Corn, MD Referring Physician: Glade Lloyd, MD  Reason for Consultation: Establishing goals of care  HPI/Patient Profile: 89 y.o. male  with past medical history of persistent A-fib on Eliquis, diastolic CHF, CVA, BPH, DM-2, thoracic AAA and debility with wheelchair dependence, poorly controlled hypertension admitted on 02/11/2023 with acute abdominal pain.   In the ED, patient was in A-fib with RVR and CT abdomen and pelvis showed small bowel obstruction, moderate ascites and BPH.  Initially was treated with conservative management and NG tube decompression for small bowel obstruction, however on 8/26 he required pressor support and underwent exploratory laparotomy and small bowel resection.  Echo showed EF of 40 to 45%.  Patient was extubated on 8/27 then reintubated on 9/1.  Most recently extubated on 9/6. PCCM has discussed possible tracheostomy with patient's family. PMT has been consulted to assist with goals of care conversation on hospital day 16 after transfer to the ICU.  Clinical Assessment and Goals of Care:  I have reviewed medical records including EPIC notes, labs and imaging, assessed the patient and then had a phone conversation with patient's wife Stephen Flynn to discuss diagnosis prognosis, GOC, EOL wishes, disposition and options.  I introduced Palliative Medicine as specialized medical care for people living with serious illness. It focuses on providing relief from the symptoms and stress of a serious illness. The goal is to improve quality of life for both the patient and the family.  We discussed a brief life review of the patient and then focused on their current illness.   I attempted to elicit values and goals of care important to the patient.    Medical  History Review and Understanding:  Provided update on patient's acute illness in the context of his chronic comorbidities.  Social History: Patient's wife reports that he could talk occasionally at baseline, though he was very hard to understand.  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  They have 1 daughter and 2 sons.  1 son has passed away unfortunately.  Functional and Nutritional State: Patient is wheelchair-bound and very debilitated.  Advance Directives: A detailed discussion regarding advanced directives was had.  Patient's wife states he has never completed advanced directives and they have never discussed his end-of-life wishes or care preferences.  Code Status: Concepts specific to code status, artifical feeding and hydration, and rehospitalization were considered and discussed.  Patient's wife cannot recall her conversation with the children about CODE STATUS.  She does recall tracheostomy conversations.  Discussion: We discussed the course of patient's hospitalization and his current status today.  She is curious about how his wound looks and his oxygen needs.  Patient's wife shares that their entire family prefer to discuss decisions together and the children are willing to coordinate to be present if necessary.  The most recent time they all met was while he was in the ICU and a potential tracheostomy was being discussed.  While she is unsure of patient's wishes on this, she knows from her father's experience that he would never want something like this.  She is currently waiting for her daughter to get off work to come visit and we will request her daughter reach out to PMT to discuss goals of care and treatment options.  She is very appreciative of the emotional support.   Discussed the importance of  continued conversation with family and the medical providers regarding overall plan of care and treatment options, ensuring decisions are within the context of the  patient's values and GOCs.   Questions and concerns were addressed.  Hard Choices booklet left for review. The family was encouraged to call with questions or concerns.  PMT will continue to support holistically.   SUMMARY OF RECOMMENDATIONS   -Continue full code/full scope treatment -Patient's wife leans on their 3 living children to make decisions on patient's half together -She remains very hopeful for his improvement -Ongoing goals of care discussions pending clinical course -Psychosocial and emotional support provided -PMT will continue to follow and support   Prognosis:  Poor  Discharge Planning: To Be Determined      Primary Diagnoses: Present on Admission:  SBO (small bowel obstruction) (HCC)  Persistent atrial fibrillation with RVR (HCC)  Chronic diastolic heart failure (HCC)  BPH (benign prostatic hyperplasia)  G E R D  Hyperlipidemia  Thoracic aortic aneurysm without rupture [facility])   Physical Exam Vitals and nursing note reviewed.  Constitutional:      Appearance: He is ill-appearing.     Interventions: Nasal cannula in place.     Comments: Appears uncomfortable. 1L [place]  Cardiovascular:     Rate and Rhythm: Normal rate.  Pulmonary:     Effort: Pulmonary effort is normal. No respiratory distress.  Skin:    General: Skin is warm and dry.  Neurological:     Mental Status: He is disoriented.  Psychiatric:        Cognition and Memory: Cognition is impaired.     Vital Signs: BP 131/86 (BP Location: Right Leg)   Pulse 72   Temp 97.8 F (36.6 C) (Axillary)   Resp 14   Ht 5' 9.02" (1.753 m)   Wt 109.6 kg   SpO2 99%   BMI 35.67 kg/m  Pain Scale: Faces POSS *See Group Information*: S-Acceptable,Sleep, easy to arouse Pain Score: 2    SpO2: SpO2: 99 % O2 Device:SpO2: 99 % O2 Flow Rate: .O2 Flow Rate (L/min): 1 L/min   Palliative Assessment/Data: 30% (on tube feeds)      Total time: I spent 75 minutes in the care of the patient today in the  above activities and documenting the encounter.    Stephen Flynn Stephen Salles, PA-C  Palliative Medicine Team Team phone # [phone]  Thank you for allowing the Palliative Medicine Team to assist in the care of this patient. Please utilize secure chat with additional questions, if there is no response within 30 minutes please call the above phone number.  Palliative Medicine Team providers are available by phone from 7am to 7pm daily and can be reached through the team cell phone.  Should this patient require assistance outside of these hours, please call the patient's attending physician.

## 2023-03-13 NOTE — Consult Note (Signed)
Cardiology Consultation   Patient ID: Stephen Flynn MRN: [id number]; DOB: 1927/09/26  Admit date: 02/01/2023 Date of Consult: 03/13/2023  PCP:  Creola Corn, MD   [facility] Providers Cardiologist:  None  - Has previously been seen by the Afib clinic    Patient Profile:   Stephen Flynn is a 89 y.o. male with a hx of persistent atrial fibrillation on eliquis, HTN, BPH, type 2 DM, thoracic aortic aneurysm, diastolic CHF, wheelchair bound, moderate malnutrition, history of CVA with right sided weakness who is being seen 03/13/2023 for the evaluation of CHF, afib at the request of Dr. Hanley Ben.  History of Present Illness:   Stephen Flynn is a 89 year old male with above past medical history. Per chart review, patient had been seen in the ED in 08/2022 after he fell out of his wheelchair while reaching for something. He had hit his head when he fell, and ct head did not show any acute or traumatic change. EKG showed atrial fibrillation. At first, it was thought that afib was new. However, documentation from the [place] revealed that patient had been in afib in 06/2022 but declined anticoagulation at that time. In the ED, patient was started on eliquis. His HR was well controlled without rate controlling medications. He was referred to the afib clinic. He was last seen by the afib clinic on 09/05/22 and was doing well at that time. He remained in rate controled afib. Was compliant with eliquis. He has otherwise been seeing a cardiologist at the [facility]  Patient presented to the ED on 8/25 complaining of abdominal pain and constipation. In the ED, he was noted to be in afib with HR in the 120s-130s. CT abd/pelvis showed small bowel obstruction, moderate ascites, and BPH. General surgery was consulted and recommended conservative management with NG tube decompression. Started on dilt infusion and IV heparin for afib. In the AM hours of 03/04/23, patient developed waning mental status and became nonverbal. Also  became hypotensive with SBP in the 70s and oxygen sats in the 80s. Required pressor support and was started on amiodarone for afib.. Abdomen was firm and exquisitely tender to pain. He underwent exploratory laparotomy and small bowel resection on March 04, 2023. Echocardiogram showed EF 40-45%, moderate LVH, normal RV function, mild aortic regurgitation.   Patient was extubated on 8/27 and taken off pressors on 8/27. Remained on TPN and nasogastric tube suction. Was treated with Zosyn to cover for intraabdominal infection. ON 8/29, attempted to move patient from the ICU but he became tachypneic and was started on BiPAP. Became oliguric and had foley placed. On 9/1, patient became unresponsive and required reintubation. Bronchoscopy was completed and showed copious amount of greenish secretions. There were concerns that chronic persistent aspiration was the cause of patient's decompensation. Started on meropenem. Again required pressors for low BP. On 9/5, patient had hemoglobin 6.6 and was transfused with 1 unit PRBCs. Patient was extubated on 9/6. TPN was discontinued on 9/6. Started on tube feeds.   On 9/10, patient was transferred out of the ICU and to the hospitalist service. Remained on IV hepain and amiodarone per tube, HR was well controlled. Cardiology consulted.    Past Medical History:  Diagnosis Date   Anemia    Aortic insufficiency    Chest pain    H/O: GI bleed    Hyperlipidemia    Hypertension    Leg cramps    Stroke [facility])     Past Surgical History:  Procedure Laterality Date  BOWEL RESECTION N/A 02/24/2023   Procedure: SMALL BOWEL RESECTION;  Surgeon: Harriette Bouillon, MD;  Location: [facility] OR;  Service: General;  Laterality: N/A;   CARDIOVASCULAR STRESS TEST  09/04/2005   LAPAROTOMY N/A 02/17/2023   Procedure: EXPLORATORY LAPAROTOMY;  Surgeon: Harriette Bouillon, MD;  Location: [facility] OR;  Service: General;  Laterality: N/A;   US ECHOCARDIOGRAPHY  11/26/2007   ef 55-60%     Home Medications:   Prior to Admission medications   Medication Sig Start Date End Date Taking? Authorizing Provider  amLODipine (NORVASC) 5 MG tablet Take 1 tablet by mouth daily.   Yes [provider]  XARELTO 20 MG TABS tablet Take 20 mg by mouth daily with supper. 01/09/23  Yes [provider]    Inpatient Medications: Scheduled Meds:  amiodarone  200 mg Per Tube BID   Chlorhexidine Gluconate Cloth  6 each Topical Daily   docusate  100 mg Per Tube BID   feeding supplement (PROSource TF20)  60 mL Per Tube Daily   free water  200 mL Per Tube Q4H   mouth rinse  15 mL Mouth Rinse Q2H   polyethylene glycol  17 g Per Tube Daily   scopolamine  1 patch Transdermal Q72H   sodium chloride flush  10-40 mL Intracatheter Q12H   Continuous Infusions:  sodium chloride 10 mL/hr at 03/12/23 0934   feeding supplement (OSMOLITE 1.5 CAL) 1,000 mL (03/12/23 2131)   heparin 850 Units/hr (03/12/23 1116)   PRN Meds: sodium chloride, HYDROmorphone (DILAUDID) injection, ondansetron **OR** ondansetron (ZOFRAN) IV, mouth rinse, sodium chloride flush  Allergies:    Allergies  Allergen Reactions   Penicillin G     Other reaction(s): Drowsy   Penicillins Hives    Did it involve swelling of the face/tongue/throat, SOB, or low BP? No Did it involve sudden or severe rash/hives, skin peeling, or any reaction on the inside of your mouth or nose? yes Did you need to seek medical attention at a hospital or doctor's office? yes When did it last happen?  child     If all above answers are "NO", may proceed with cephalosporin use.    Social History:   Social History   Socioeconomic History   Marital status: Married    Spouse name: Not on file   Number of children: Not on file   Years of education: Not on file   Highest education level: Not on file  Occupational History   Not on file  Tobacco Use   Smoking status: Former   Smokeless tobacco: Never  Substance and Sexual Activity   Alcohol use: No    Drug use: No   Sexual activity: Not on file  Other Topics Concern   Not on file  Social History Narrative   Not on file   Social Determinants of Health   Financial Resource Strain: Not on file  Food Insecurity: No Food Insecurity (03/13/2023)   Hunger Vital Sign    Worried About Running Out of Food in the Last Year: Never true    Ran Out of Food in the Last Year: Never true  Transportation Needs: No Transportation Needs (03/13/2023)   PRAPARE - Administrator, Civil Service (Medical): No    Lack of Transportation (Non-Medical): No  Physical Activity: Not on file  Stress: Not on file  Social Connections: Not on file  Intimate Partner Violence: Not At Risk (03/13/2023)   Humiliation, Afraid, Rape, and Kick questionnaire    Fear of Current  or Ex-Partner: No    Emotionally Abused: No    Physically Abused: No    Sexually Abused: No    Family History:   History reviewed. No pertinent family history.   ROS:  Please see the history of present illness.  All other ROS reviewed and negative.     Physical Exam/Data:   Vitals:   03/13/23 0412 03/13/23 0416 03/13/23 0759 03/13/23 1131  BP:   (!) 141/90 131/86  Pulse:  76 71 72  Resp:   18 14  Temp:  97.8 F (36.6 C) 97.9 F (36.6 C) 97.8 F (36.6 C)  TempSrc: Oral Oral Axillary Axillary  SpO2: 95%  100% 99%  Weight: 109.6 kg     Height:        Intake/Output Summary (Last 24 hours) at 03/13/2023 1402 Last data filed at 03/13/2023 0553 Gross per 24 hour  Intake 1404.39 ml  Output 600 ml  Net 804.39 ml      03/13/2023    4:12 AM 03/12/2023    5:00 AM 03/11/2023    4:11 AM  Last 3 Weights  Weight (lbs) 241 lb 10 oz 227 lb 8.2 oz 225 lb 12 oz  Weight (kg) 109.6 kg 103.2 kg 102.4 kg     Body mass index is 35.67 kg/m.  General:  Elderly male, confused. Wearing mittens/soft restraints  HEENT: normal Neck: no JVD Cardiac:  normal S1, S2; Irregular rate and rhythm; no murmur Lungs:  crackles throughout. Breathing  unlabored  Abd: soft, patient tenses on light palpation  Ext: no edema in BLE  Musculoskeletal:  No deformities, BUE and BLE strength normal and equal Skin: warm and dry  Neuro:  Patient does not answer questions. Does not speak, but moans in response to stimulation   EKG:  The EKG was personally reviewed and demonstrates:  EKG from 8/25 showed afib with HR 123 BPM  Telemetry:  Telemetry was personally reviewed and demonstrates: Atrial fibrillation with HR in the 70s-90s. Rare PVCs  Relevant CV Studies:  Echocardiogram 2023-03-28   1. Left ventricular ejection fraction, by estimation, is 40 to 45%. The  left ventricle has mildly decreased function. The left ventricle  demonstrates global hypokinesis. There is moderate concentric left  ventricular hypertrophy. Left ventricular  diastolic function could not be evaluated.   2. Right ventricular systolic function is normal. The right ventricular  size is normal. There is normal pulmonary artery systolic pressure. The  estimated right ventricular systolic pressure is 31.7 mmHg.   3. Left atrial size was mildly dilated.   4. The mitral valve is degenerative. Trivial mitral valve regurgitation.  No evidence of mitral stenosis.   5. The aortic valve is tricuspid. Aortic valve regurgitation is mild.  Aortic valve sclerosis/calcification is present, without any evidence of  aortic stenosis. Aortic valve area, by VTI measures 2.28 cm. Aortic valve  mean gradient measures 5.0 mmHg.  Aortic valve Vmax measures 1.41 m/s.   6. The inferior vena cava is normal in size with greater than 50%  respiratory variability, suggesting right atrial pressure of 3 mmHg.   7. Ascending aorta measurements are within normal limits for age when  indexed to body surface area.   Laboratory Data:  High Sensitivity Troponin:   Recent Labs  Lab 03/03/2023 1433  TROPONINIHS 13     Chemistry Recent Labs  Lab 03/08/23 0603 03/09/23 0419 03/10/23 0205  03/11/23 0200 03/12/23 0430  NA 144 143 145 147* 150*  K 4.5 4.4 4.7 4.6  4.3  CL 112* 110 111 116* 116*  CO2 24 27 25 25 28   GLUCOSE 144* 140* 109* 125* 87  BUN 89* 87* 86* 82* 70*  CREATININE 1.16 1.15 1.14 1.14 1.06  CALCIUM 8.9 8.7* 8.8* 8.9 9.2  MG 2.4 2.4 2.4  --   --   GFRNONAA 59* 59* 60* 60* >60  ANIONGAP 8 6 9 6 6     Recent Labs  Lab 03/08/23 0603  PROT 5.3*  ALBUMIN 2.0*  AST 21  ALT 18  ALKPHOS 41  BILITOT 0.9   Lipids No results for input(s): "CHOL", "TRIG", "HDL", "LABVLDL", "LDLCALC", "CHOLHDL" in the last 168 hours.  Hematology Recent Labs  Lab 03/11/23 0200 03/12/23 0430 03/13/23 0655  WBC 5.8 4.5 4.3  RBC 2.36* 2.42* 2.49*  HGB 7.2* 7.4* 7.4*  HCT 22.8* 24.0* 24.3*  MCV 96.6 99.2 97.6  MCH 30.5 30.6 29.7  MCHC 31.6 30.8 30.5  RDW 16.3* 16.3* 16.1*  PLT 238 240 251   Thyroid No results for input(s): "TSH", "FREET4" in the last 168 hours.  BNPNo results for input(s): "BNP", "PROBNP" in the last 168 hours.  DDimer No results for input(s): "DDIMER" in the last 168 hours.   Radiology/Studies:  DG Abd Portable 1V  Result Date: 03/12/2023 CLINICAL DATA:  Nasogastric tube placement EXAM: PORTABLE ABDOMEN - 1 VIEW COMPARISON:  03/11/2023 FINDINGS: Nasoenteric feeding tube is been advanced and is now seen looped within the gastric lumen with its tip in the expected region of the gastric antrum. Visualized abdominal gas pattern is nonobstructive. Pelvis excluded from view. No free intraperitoneal gas. IMPRESSION: 1. Nasoenteric feeding tube looped within the gastric lumen, tip in the expected region of the gastric antrum. Electronically Signed   By: Helyn Numbers M.D.   On: 03/12/2023 18:51   CT HEAD WO CONTRAST ( )  Result Date: 03/12/2023 CLINICAL DATA:  Neuro deficit, stroke suspected EXAM: CT HEAD WITHOUT CONTRAST TECHNIQUE: Contiguous axial images were obtained from the base of the skull through the vertex without intravenous contrast. RADIATION DOSE  REDUCTION: This exam was performed according to the departmental dose-optimization program which includes automated exposure control, adjustment of the mA and/or kV according to patient size and/or use of iterative reconstruction technique. COMPARISON:  Brain MRI 01/05/2023 FINDINGS: Brain: There is no acute intracranial hemorrhage, extra-axial fluid collection, or acute infarct. Parenchymal volume loss with prominence of the ventricular system and extra-axial CSF spaces is unchanged. The ventricles are stable in size. Patchy hypodensity in the supratentorial white matter consistent with underlying chronic small-vessel ischemic change is stable. The pituitary and suprasellar region are normal. There is no mass lesion. There is no mass effect or midline shift. Vascular: No hyperdense vessel or unexpected calcification. Skull: Normal. Negative for fracture or focal lesion. Sinuses/Orbits: There is mucosal thickening throughout the paranasal sinuses, improved since the prior study. There are new bilateral mastoid and left middle ear effusions, nonspecific. No obstructing nasopharyngeal lesion is seen. The globes and orbits are unremarkable. 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 IMPRESSION: 1. Stable noncontrast appearance of the brain with no acute intracranial pathology. 2. New bilateral mastoid effusions. Electronically Signed   By: Lesia Hausen M.D.   On: 03/12/2023 14:17   DG Abd Portable 1V  Result Date: 03/11/2023 CLINICAL DATA:  89 year old male status post nasogastric tube placement. EXAM: PORTABLE ABDOMEN - 1 VIEW COMPARISON:  Abdominal radiograph 03/05/2023. FINDINGS: Small bore feeding tube in position with tip in the proximal stomach. No nasogastric tube identified.  Visualized bowel-gas pattern is unremarkable. IMPRESSION: Tip of feeding tube in the proximal stomach. Electronically Signed   By: Trudie Reed M.D.   On: 03/11/2023 07:20     Assessment and Plan:   Permanent Atrial  Fibrillation  - First diagnosed with afib in 06/2022. Rate has been controlled without medications and he was asymptomatic, so there have been no attempts to convert him to NSR  - Per telemetry, HR very well controlled in the 70s-90s  - Now on amiodarone 200 mg BID per tube- OK to continue for rate control as patient is acutely ill/admitted. However, I do not expect he will need to be on amiodarone long term - Continue IV heparin until all procedures complete this admission- plan to transition back to eliquis prior to DC. CHADS-VASc 8   New cardiomyopathy with reduced EF  - Echocardiogram this admission showed EF 40-45% with moderate LVH, normal RV function. EF was previously 55-60% in 03/2019  - Suspect EF is reduced in the setting of acute illness- at the time of echo, patient had SBO and septic shock  - Patient not a candidate for ischemic evaluation due to advanced age, metabolic encephalopathy, recent small bowel resection - Palliative care has been consulted- follow up after palliative care discussion. If patient/family wants aggressive treatment, can add GDMT as BP and renal function tolerates   Otherwise per primary - SBO s/p ex lap and small bowel resection  - Acute respiratory failure with hypoxia and hypercapnia, septic shock, aspiration PNA - Acute metabolic encephalopathy   - AKI  - Hypernatremia - Type 2 DM  - Anemia of chronic disease, anemia due to critical illness  - Generalized deconditioning, wheelchair-bound  - Moderate malnutrition    Risk Assessment/Risk Scores:   New York Heart Association (NYHA) Functional Class NYHA Class unknown- patient unable to participate in interview   CHA2DS2-VASc Score = 8   This indicates a 10.8% annual risk of stroke. The patient's score is based upon: CHF History: 1 HTN History: 1 Diabetes History: 1 Stroke History: 2 Vascular Disease History: 1 Age Score: 2 Gender Score: 0    For questions or updates, please contact [facility] Please consult [email or web address] for contact info under    Signed, Jonita Albee, PA-C  03/13/2023 2:02 PM

## 2023-03-13 NOTE — Inpatient Diabetes Management (Signed)
Inpatient Diabetes Program Recommendations  AACE/ADA: New Consensus Statement on Inpatient Glycemic Control (2015)  Target Ranges:  Prepandial:   less than 140 mg/dL      Peak postprandial:   less than 180 mg/dL (1-2 hours)      Critically ill patients:  140 - 180 mg/dL   Lab Results  Component Value Date   GLUCAP 101 (H) 03/13/2023   HGBA1C 6.5 (H) 02/10/2023    Review of Glycemic Control  Latest Reference Range & Units 03/12/23 19:38 03/12/23 20:10 03/13/23 00:09 03/13/23 04:07 03/13/23 08:01  Glucose-Capillary 70 - 99 mg/dL 62 (L) 782 (H) 98 956 (H) 101 (H)   Diabetes history: DM  Outpatient Diabetes medications:  None Current orders for Inpatient glycemic control:  Novolog 0-15 units q 4 hours  Inpatient Diabetes Program Recommendations:    Please consider d/c of Novolog correction as patient does not appear to need.   Thanks,  Beryl Meager, RN, BC-ADM Inpatient Diabetes Coordinator Pager [phone]  (8a-5p)

## 2023-03-13 NOTE — Care Management Important Message (Signed)
Important Message  Patient Details  Name: Stephen Flynn MRN: [id number] Date of Birth: 07/01/1928   Medicare Important Message Given:  Yes     [name] 03/13/2023, 12:40 PM

## 2023-03-13 NOTE — Progress Notes (Signed)
ANTICOAGULATION CONSULT NOTE  Pharmacy Consult for Heparin Indication: atrial fibrillation  Allergies  Allergen Reactions   Penicillin G     Other reaction(s): Drowsy   Penicillins Hives    Did it involve swelling of the face/tongue/throat, SOB, or low BP? No Did it involve sudden or severe rash/hives, skin peeling, or any reaction on the inside of your mouth or nose? yes Did you need to seek medical attention at a hospital or doctor's office? yes When did it last happen?  child     If all above answers are "NO", may proceed with cephalosporin use.    Patient Measurements: Height: 5' 9.02" (175.3 cm) Weight: 109.6 kg (241 lb 10 oz) IBW/kg (Calculated) : 70.74 Heparin Dosing Weight: 95 kg  Vital Signs: Temp: 97.9 F (36.6 C) (09/10 0759) Temp Source: Axillary (09/10 0759) BP: 141/90 (09/10 0759) Pulse Rate: 71 (09/10 0759)  Labs: Recent Labs     0000 03/10/23 1800 03/11/23 0200 03/11/23 0415 03/11/23 0415 03/12/23 0430 03/12/23 1756 03/13/23 0655  HGB   < >  --  7.2*  --   --  7.4*  --  7.4*  HCT  --   --  22.8*  --   --  24.0*  --  24.3*  PLT  --   --  238  --   --  240  --  251  APTT  --  88*  --  83*  --  90*  --   --   HEPARINUNFRC  --   --   --  0.64   < > 0.57 0.38 0.35  CREATININE  --   --  1.14  --   --  1.06  --   --    < > = values in this interval not displayed.    Estimated Creatinine Clearance: 53.1 mL/min (by C-G formula based on SCr of 1.06 mg/dL).   Medical History: Past Medical History:  Diagnosis Date   Anemia    Aortic insufficiency    Chest pain    H/O: GI bleed    Hyperlipidemia    Hypertension    Leg cramps    Stroke [facility])     Assessment: 89 YOM s/p SBR on Eliqius for atrial fibrillation. Tracheostomy planned 9/9 - plan to hold Eliquis and start heparin until trach completed.  Heparin level therapeutic this AM at 0.35.  No overt bleeding or complications noted.  Goal of Therapy:  Heparin level 0.3-0.5 units/ml aPTT 66-85  seconds Monitor platelets by anticoagulation protocol: Yes   Plan:  Continue heparin at  850 units/hr Heparin level and CBC with morning labs. Planning Eliquis - please note if cortrak is moved to post-pyloric, Eliquis should not be given post-pylorically due to poor absorption.  Reece Leader, Colon Flattery, [facility] Clinical Pharmacist  03/13/2023 11:06 AM   [facility] pharmacy phone numbers are listed on [email or web address]

## 2023-03-13 NOTE — Progress Notes (Signed)
15 Days Post-Op   Subjective/Chief Complaint: Transferred out of ICU. Tube feeds resumed. Remains disoriented, head CT yesterday with no acute findings.   Objective: Vital signs in last 24 hours: Temp:  [97.8 F (36.6 C)-98.2 F (36.8 C)] 97.9 F (36.6 C) (09/10 0759) Pulse Rate:  [71-94] 71 (09/10 0759) Resp:  [17-18] 18 (09/10 0759) BP: (122-141)/(70-98) 141/90 (09/10 0759) SpO2:  [95 %-100 %] 100 % (09/10 0759) Weight:  [109.6 kg] 109.6 kg (09/10 0412) Last BM Date : 03/11/23  Intake/Output from previous day: 09/09 0701 - 09/10 0700 In: 1454.6 [I.V.:236.3; NG/GT:1218.3] Out: 1400 [Urine:1400] Intake/Output this shift: No intake/output data recorded. Alert, NAD Cortrak in place, tube feeds running at 30 ml/hr. Abdomen soft and nondistended. Wound vac in place on midline wound, serosanguinous fluid in canister.   Lab Results:  Recent Labs    03/12/23 0430 03/13/23 0655  WBC 4.5 4.3  HGB 7.4* 7.4*  HCT 24.0* 24.3*  PLT 240 251   BMET Recent Labs    03/11/23 0200 03/12/23 0430  NA 147* 150*  K 4.6 4.3  CL 116* 116*  CO2 25 28  GLUCOSE 125* 87  BUN 82* 70*  CREATININE 1.14 1.06  CALCIUM 8.9 9.2   PT/INR No results for input(s): "LABPROT", "INR" in the last 72 hours. ABG No results for input(s): "PHART", "HCO3" in the last 72 hours.  Invalid input(s): "PCO2", "PO2"  Studies/Results: DG Abd Portable 1V  Result Date: 03/12/2023 CLINICAL DATA:  Nasogastric tube placement EXAM: PORTABLE ABDOMEN - 1 VIEW COMPARISON:  03/11/2023 FINDINGS: Nasoenteric feeding tube is been advanced and is now seen looped within the gastric lumen with its tip in the expected region of the gastric antrum. Visualized abdominal gas pattern is nonobstructive. Pelvis excluded from view. No free intraperitoneal gas. IMPRESSION: 1. Nasoenteric feeding tube looped within the gastric lumen, tip in the expected region of the gastric antrum. Electronically Signed   By: Helyn Numbers M.D.   On:  03/12/2023 18:51   CT HEAD WO CONTRAST ( )  Result Date: 03/12/2023 CLINICAL DATA:  Neuro deficit, stroke suspected EXAM: CT HEAD WITHOUT CONTRAST TECHNIQUE: Contiguous axial images were obtained from the base of the skull through the vertex without intravenous contrast. RADIATION DOSE REDUCTION: This exam was performed according to the departmental dose-optimization program which includes automated exposure control, adjustment of the mA and/or kV according to patient size and/or use of iterative reconstruction technique. COMPARISON:  Brain MRI 01/05/2023 FINDINGS: Brain: There is no acute intracranial hemorrhage, extra-axial fluid collection, or acute infarct. Parenchymal volume loss with prominence of the ventricular system and extra-axial CSF spaces is unchanged. The ventricles are stable in size. Patchy hypodensity in the supratentorial white matter consistent with underlying chronic small-vessel ischemic change is stable. The pituitary and suprasellar region are normal. There is no mass lesion. There is no mass effect or midline shift. Vascular: No hyperdense vessel or unexpected calcification. Skull: Normal. Negative for fracture or focal lesion. Sinuses/Orbits: There is mucosal thickening throughout the paranasal sinuses, improved since the prior study. There are new bilateral mastoid and left middle ear effusions, nonspecific. No obstructing nasopharyngeal lesion is seen. The globes and orbits are unremarkable. 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 IMPRESSION: 1. Stable noncontrast appearance of the brain with no acute intracranial pathology. 2. New bilateral mastoid effusions. Electronically Signed   By: Lesia Hausen M.D.   On: 03/12/2023 14:17    Anti-infectives: Anti-infectives (From admission, onward)    Start  Dose/Rate Route Frequency Ordered Stop   03/04/23 1000  meropenem (MERREM) 1 g in sodium chloride 0.9 % 100 mL IVPB        1 g 200 mL/hr over 30 Minutes Intravenous  Every 12 hours 03/04/23 0855 03/10/23 2132   03/01/23 1500  ceFEPIme (MAXIPIME) 2 g in sodium chloride 0.9 % 100 mL IVPB        2 g 200 mL/hr over 30 Minutes Intravenous Every 12 hours 03/01/23 1124 03/02/23 0356   03/01/23 0300  ceFEPIme (MAXIPIME) 2 g in sodium chloride 0.9 % 100 mL IVPB  Status:  Discontinued        2 g 200 mL/hr over 30 Minutes Intravenous Every 24 hours 02/28/23 1141 03/01/23 1124   02/14/2023 1445  piperacillin-tazobactam (ZOSYN) IVPB 3.375 g  Status:  Discontinued        3.375 g 100 mL/hr over 30 Minutes Intravenous Every 8 hours 02/16/2023 1436 02/12/2023 1445   02/16/2023 1445  ceFEPIme (MAXIPIME) 2 g in sodium chloride 0.9 % 100 mL IVPB  Status:  Discontinued        2 g 200 mL/hr over 30 Minutes Intravenous Every 12 hours 02/12/2023 1446 02/28/23 1141   02/13/2023 1445  metroNIDAZOLE (FLAGYL) IVPB 500 mg        500 mg 100 mL/hr over 60 Minutes Intravenous Every 12 hours 02/24/2023 1446 03/02/23 1719   02/02/2023 1015  meropenem (MERREM) 1 g in sodium chloride 0.9 % 100 mL IVPB        1 g 200 mL/hr over 30 Minutes Intravenous  Once 02/24/2023 0917 02/08/2023 1743       Assessment/Plan: -POD#15 s/p Exploratory laparotomy small bowel resection and primary anastomosis 8/26 Dr. Luisa Hart - approximately 120 cm of small bowel remaining, if patient has a lot of diarrhea may need imodium. - secretions better, prior concern for aspiration, no PO intake until cleared by SLP. - Cortrak repositioned yesterday, on XR the tip is in the distal stomach, does not appear post-pyloric. Ok to continue feeds, but if any nausea or emesis will need to advance past the pylorus to minimize aspiration risk. - abdominal wound vac MWF   Fritzi Mandes MD 03/13/2023

## 2023-03-13 NOTE — Progress Notes (Addendum)
PROGRESS NOTE    Myrna Majid  ZOX:[id number] DOB: June 07, 1928 DOA: 02/14/2023 PCP: Creola Corn, MD   Brief Narrative:  89 year old male with history of persistent atrial fibrillation on Eliquis, wheelchair-bound, chronic diastolic heart failure, hypertension, unspecified CVA, BPH, diabetes mellitus type 2, thoracic AAA presented with worsening abdominal pain and no bowel movement 3 days prior to presentation.  On presentation, he was in A-fib with RVR requiring Cardizem and heparin drips.  CT of abdomen and pelvis showed small bowel obstruction and moderate ascites.  General surgery initially recommended conservative management with NG tube decompression; he was initially admitted to [facility] service.  Subsequently, he became confused/nonverbal with hypotension and desaturation with increased abdominal tenderness.  He was transferred to ICU.  He underwent exploratory laparotomy and small bowel resection on 02/28/2023; returned back to ICU intubated.  He was extubated on 02/27/2023.  He was reintubated on 03/04/2023.  He had bronchoscopy with BAL on 03/04/2023.  Received 1 unit packed red cells on 03/08/2023 for hemoglobin of 6.6.  He was extubated again on 03/09/2023. He was on TPN which has already been discontinued.  He is currently on cortrak feedings.  He was transferred to [facility] service from 03/13/2023 onwards.  Assessment & Plan:   Small bowel obstruction status post expiratory laparotomy and small bowel resection Acute abdomen, resolved -Had exploratory laparotomy and small bowel resection on 2023/02/28. -Off TPN; currently on cortrak feedings -General Surgery following: Wound/wound VAC care as per general surgery  Acute respiratory failure with hypoxia and hypercapnia Septic shock: Present on admission, resolved Aspiration pneumonia -Patient was intubated close to admission; extubated on 02/27/2023 but was reintubated on 03/04/2023.  He was extubated on 03/09/2023. -Currently on 1 L oxygen via nasal cannula.   Wean off as able. -Encourage incentive spirometry. -Care transferred from PCCM to [facility] service from 03/13/2023 onwards: As per PCCM, if he requires intubation again, PCCM will have to proceed with tracheostomy based on PCCM's prior discussion with family -Patient has completed antibiotic therapy with meropenem  Acute metabolic and septic encephalopathy -Waxing and waning mental status.  Still slow to respond.  Monitor. -Follow-up precautions.  Persistent A-fib with RVR -Currently on heparin drip.  Rate controlled.  On amiodarone via tube. -Consult cardiology  Acute systolic heart failure in a patient with history of chronic diastolic heart failure -Echo during this hospitalization showed EF of 40 to 45%.  Prior echo had shown EF of 55 to 60% on 03/22/2019. -Strict input or output.  Daily weights.  Fluid restriction.  Continue Lasix.  Cardiology evaluation.  AKI -Improved  Hypernatremia -Sodium 150 on 03/12/2023.  Sodium pending today.  Repeat a.m. labs.  Continue free water flushes through tube.  Diabetes mellitus type 2 -A1c 6.5.  Continue CBGs with SSI.  Anemia of chronic disease Anemia due to critical illness -Required 1 unit packed red cell transfusion on 03/08/2023 for hemoglobin of 6.6.  Hemoglobin 7.4 today.  Transfuse if hemoglobin is less than 7 or if there is any active bleeding.  Generalized deconditioning Baseline debility: Wheelchair-bound Goals of care -Overall prognosis is guarded to poor.  Currently listed as full code.  Consult palliative care for goals of care discussion  Moderate malnutrition -Follow dietitian/nutrition recommendations.  TPN discontinued on 03/09/2023. -Currently on cortrak feeds  Obesity -Outpatient follow-up  DVT prophylaxis: Heparin drip Code Status: Full Family Communication: None at bedside Disposition Plan: Status is: Inpatient Remains inpatient appropriate because: Of severity of illness  Consultants: PCCM/general  surgery  Procedures: As above  Antimicrobials:  Anti-infectives (From admission, onward)    Start     Dose/Rate Route Frequency Ordered Stop   03/04/23 1000  meropenem (MERREM) 1 g in sodium chloride 0.9 % 100 mL IVPB        1 g 200 mL/hr over 30 Minutes Intravenous Every 12 hours 03/04/23 0855 03/10/23 2132   03/01/23 1500  ceFEPIme (MAXIPIME) 2 g in sodium chloride 0.9 % 100 mL IVPB        2 g 200 mL/hr over 30 Minutes Intravenous Every 12 hours 03/01/23 1124 03/02/23 0356   03/01/23 0300  ceFEPIme (MAXIPIME) 2 g in sodium chloride 0.9 % 100 mL IVPB  Status:  Discontinued        2 g 200 mL/hr over 30 Minutes Intravenous Every 24 hours 02/28/23 1141 03/01/23 1124   03-27-23 1445  piperacillin-tazobactam (ZOSYN) IVPB 3.375 g  Status:  Discontinued        3.375 g 100 mL/hr over 30 Minutes Intravenous Every 8 hours March 27, 2023 1436 Mar 27, 2023 1445   Mar 27, 2023 1445  ceFEPIme (MAXIPIME) 2 g in sodium chloride 0.9 % 100 mL IVPB  Status:  Discontinued        2 g 200 mL/hr over 30 Minutes Intravenous Every 12 hours 03-27-23 1446 02/28/23 1141   03-27-23 1445  metroNIDAZOLE (FLAGYL) IVPB 500 mg        500 mg 100 mL/hr over 60 Minutes Intravenous Every 12 hours 03/27/2023 1446 03/02/23 1719   Mar 27, 2023 1015  meropenem (MERREM) 1 g in sodium chloride 0.9 % 100 mL IVPB        1 g 200 mL/hr over 30 Minutes Intravenous  Once 2023-03-27 0917 03/27/23 1743        Subjective: Patient seen and examined at bedside.  Awake, slow to respond, poor historian.  No seizures, agitation, vomiting reported.  Objective: Vitals:   03/13/23 0011 03/13/23 0412 03/13/23 0416 03/13/23 0759  BP: 122/70   (!) 141/90  Pulse:   76 71  Resp:    18  Temp: 97.8 F (36.6 C)  97.8 F (36.6 C) 97.9 F (36.6 C)  TempSrc: Oral Oral Oral Axillary  SpO2:  95%  100%  Weight:  109.6 kg    Height:        Intake/Output Summary (Last 24 hours) at 03/13/2023 0945 Last data filed at 03/13/2023 0553 Gross per 24 hour  Intake  1404.39 ml  Output 1400 ml  Net 4.39 ml   Filed Weights   03/11/23 0411 03/12/23 0500 03/13/23 0412  Weight: 102.4 kg 103.2 kg 109.6 kg    Examination:  General exam: Appears calm and comfortable.  Looks chronically ill and deconditioned.  On 1 L oxygen by nasal cannula. ENT: Cortrak tube present with ongoing feeding Respiratory system: Bilateral decreased breath sounds at bases with scattered crackles Cardiovascular system: S1 & S2 heard, Rate controlled Gastrointestinal system: Abdomen is obese, distended, soft and mildly tender.  Wound VAC present.   Extremities: No cyanosis, clubbing; bilateral lower extremity edema present  Central nervous system: Awake, slow to respond, speech is not that comprehensible.  No obvious focal deficits noted  skin: No rashes, lesions or ulcers Psychiatry: Currently not agitated.  Affect is flat.   Data Reviewed: I have personally reviewed following labs and imaging studies  CBC: Recent Labs  Lab 03/09/23 0419 03/10/23 0205 03/11/23 0200 03/12/23 0430 03/13/23 0655  WBC 6.4 6.6 5.8 4.5 4.3  HGB 7.1* 7.3* 7.2* 7.4* 7.4*  HCT 22.1* 23.0* 22.8* 24.0* 24.3*  MCV 92.1 95.0 96.6 99.2 97.6  PLT 244 240 238 240 251   Basic Metabolic Panel: Recent Labs  Lab 03/07/23 0512 03/08/23 0603 03/09/23 0419 03/10/23 0205 03/11/23 0200 03/12/23 0430  NA 143 144 143 145 147* 150*  K 4.5 4.5 4.4 4.7 4.6 4.3  CL 112* 112* 110 111 116* 116*  CO2 25 24 27 25 25 28   GLUCOSE 144* 144* 140* 109* 125* 87  BUN 90* 89* 87* 86* 82* 70*  CREATININE 1.28* 1.16 1.15 1.14 1.14 1.06  CALCIUM 8.8* 8.9 8.7* 8.8* 8.9 9.2  MG 2.3 2.4 2.4 2.4  --   --   PHOS  --  4.2 3.7 4.1  --   --    GFR: Estimated Creatinine Clearance: 53.1 mL/min (by C-G formula based on SCr of 1.06 mg/dL). Liver Function Tests: Recent Labs  Lab 03/08/23 0603  AST 21  ALT 18  ALKPHOS 41  BILITOT 0.9  PROT 5.3*  ALBUMIN 2.0*   No results for input(s): "LIPASE", "AMYLASE" in the last  168 hours. No results for input(s): "AMMONIA" in the last 168 hours. Coagulation Profile: No results for input(s): "INR", "PROTIME" in the last 168 hours. Cardiac Enzymes: No results for input(s): "CKTOTAL", "CKMB", "CKMBINDEX", "TROPONINI" in the last 168 hours. BNP (last 3 results) No results for input(s): "PROBNP" in the last 8760 hours. HbA1C: No results for input(s): "HGBA1C" in the last 72 hours. CBG: Recent Labs  Lab 03/12/23 1938 03/12/23 2010 03/13/23 0009 03/13/23 0407 03/13/23 0801  GLUCAP 62* 101* 98 104* 101*   Lipid Profile: No results for input(s): "CHOL", "HDL", "LDLCALC", "TRIG", "CHOLHDL", "LDLDIRECT" in the last 72 hours. Thyroid Function Tests: No results for input(s): "TSH", "T4TOTAL", "FREET4", "T3FREE", "THYROIDAB" in the last 72 hours. Anemia Panel: No results for input(s): "VITAMINB12", "FOLATE", "FERRITIN", "TIBC", "IRON", "RETICCTPCT" in the last 72 hours. Sepsis Labs: No results for input(s): "PROCALCITON", "LATICACIDVEN" in the last 168 hours.  Recent Results (from the past 240 hour(s))  Culture, Respiratory w Gram Stain     Status: None   Collection Time: 03/04/23  7:37 AM   Specimen: Tracheal Aspirate; Respiratory  Result Value Ref Range Status   Specimen Description TRACHEAL ASPIRATE  Final   Special Requests NONE  Final   Gram Stain   Final    RARE SQUAMOUS EPITHELIAL CELLS PRESENT MODERATE WBC PRESENT, PREDOMINANTLY PMN NO ORGANISMS SEEN Performed at [facility], [address] [place], [place] [address]    Culture FEW CANDIDA PARAPSILOSIS  Final   Report Status 03/09/2023 FINAL  Final         Radiology Studies: DG Abd Portable 1V  Result Date: 03/12/2023 CLINICAL DATA:  Nasogastric tube placement EXAM: PORTABLE ABDOMEN - 1 VIEW COMPARISON:  03/11/2023 FINDINGS: Nasoenteric feeding tube is been advanced and is now seen looped within the gastric lumen with its tip in the expected region of the gastric antrum. Visualized  abdominal gas pattern is nonobstructive. Pelvis excluded from view. No free intraperitoneal gas. IMPRESSION: 1. Nasoenteric feeding tube looped within the gastric lumen, tip in the expected region of the gastric antrum. Electronically Signed   By: Helyn Numbers M.D.   On: 03/12/2023 18:51   CT HEAD WO CONTRAST ( )  Result Date: 03/12/2023 CLINICAL DATA:  Neuro deficit, stroke suspected EXAM: CT HEAD WITHOUT CONTRAST TECHNIQUE: Contiguous axial images were obtained from the base of the skull through the vertex without intravenous contrast. RADIATION DOSE REDUCTION: This exam was performed according to the  departmental dose-optimization program which includes automated exposure control, adjustment of the mA and/or kV according to patient size and/or use of iterative reconstruction technique. COMPARISON:  Brain MRI 01/05/2023 FINDINGS: Brain: There is no acute intracranial hemorrhage, extra-axial fluid collection, or acute infarct. Parenchymal volume loss with prominence of the ventricular system and extra-axial CSF spaces is unchanged. The ventricles are stable in size. Patchy hypodensity in the supratentorial white matter consistent with underlying chronic small-vessel ischemic change is stable. The pituitary and suprasellar region are normal. There is no mass lesion. There is no mass effect or midline shift. Vascular: No hyperdense vessel or unexpected calcification. Skull: Normal. Negative for fracture or focal lesion. Sinuses/Orbits: There is mucosal thickening throughout the paranasal sinuses, improved since the prior study. There are new bilateral mastoid and left middle ear effusions, nonspecific. No obstructing nasopharyngeal lesion is seen. The globes and orbits are unremarkable. 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 IMPRESSION: 1. Stable noncontrast appearance of the brain with no acute intracranial pathology. 2. New bilateral mastoid effusions. Electronically Signed   By: Lesia Hausen M.D.    On: 03/12/2023 14:17        Scheduled Meds:  amiodarone  200 mg Per Tube BID   Chlorhexidine Gluconate Cloth  6 each Topical Daily   docusate  100 mg Per Tube BID   feeding supplement (PROSource TF20)  60 mL Per Tube Daily   free water  200 mL Per Tube Q4H   insulin aspart  0-15 Units Subcutaneous Q4H   mouth rinse  15 mL Mouth Rinse Q2H   polyethylene glycol  17 g Per Tube Daily   scopolamine  1 patch Transdermal Q72H   sodium chloride flush  10-40 mL Intracatheter Q12H   Continuous Infusions:  sodium chloride 10 mL/hr at 03/12/23 0934   feeding supplement (OSMOLITE 1.5 CAL) 1,000 mL (03/12/23 2131)   heparin 850 Units/hr (03/12/23 1116)          Glade Lloyd, MD [facility] 03/13/2023, 9:45 AM

## 2023-03-14 ENCOUNTER — Inpatient Hospital Stay (HOSPITAL_COMMUNITY): Payer: 59

## 2023-03-14 DIAGNOSIS — E440 Moderate protein-calorie malnutrition: Secondary | ICD-10-CM | POA: Diagnosis not present

## 2023-03-14 DIAGNOSIS — K56609 Unspecified intestinal obstruction, unspecified as to partial versus complete obstruction: Secondary | ICD-10-CM | POA: Diagnosis not present

## 2023-03-14 DIAGNOSIS — T17908A Unspecified foreign body in respiratory tract, part unspecified causing other injury, initial encounter: Secondary | ICD-10-CM

## 2023-03-14 DIAGNOSIS — J9601 Acute respiratory failure with hypoxia: Secondary | ICD-10-CM | POA: Diagnosis not present

## 2023-03-14 DIAGNOSIS — I4819 Other persistent atrial fibrillation: Secondary | ICD-10-CM | POA: Diagnosis not present

## 2023-03-14 LAB — COMPREHENSIVE METABOLIC PANEL
ALT: 21 U/L (ref 0–44)
AST: 20 U/L (ref 15–41)
Albumin: 2.4 g/dL — ABNORMAL LOW (ref 3.5–5.0)
Alkaline Phosphatase: 57 U/L (ref 38–126)
Anion gap: 5 (ref 5–15)
BUN: 60 mg/dL — ABNORMAL HIGH (ref 8–23)
CO2: 30 mmol/L (ref 22–32)
Calcium: 9.1 mg/dL (ref 8.9–10.3)
Chloride: 113 mmol/L — ABNORMAL HIGH (ref 98–111)
Creatinine, Ser: 1.15 mg/dL (ref 0.61–1.24)
GFR, Estimated: 59 mL/min — ABNORMAL LOW (ref >60)
Glucose, Bld: 120 mg/dL — ABNORMAL HIGH (ref 70–99)
Potassium: 4.1 mmol/L (ref 3.5–5.1)
Sodium: 148 mmol/L — ABNORMAL HIGH (ref 135–145)
Total Bilirubin: 0.5 mg/dL (ref 0.3–1.2)
Total Protein: 6.4 g/dL — ABNORMAL LOW (ref 6.5–8.1)

## 2023-03-14 LAB — CBC
HCT: 24.3 % — ABNORMAL LOW (ref 39.0–52.0)
Hemoglobin: 7.3 g/dL — ABNORMAL LOW (ref 13.0–17.0)
MCH: 29.8 pg (ref 26.0–34.0)
MCHC: 30 g/dL (ref 30.0–36.0)
MCV: 99.2 fL (ref 80.0–100.0)
Platelets: 245 10*3/uL (ref 150–400)
RBC: 2.45 MIL/uL — ABNORMAL LOW (ref 4.22–5.81)
RDW: 16.3 % — ABNORMAL HIGH (ref 11.5–15.5)
WBC: 4.6 10*3/uL (ref 4.0–10.5)
nRBC: 0 % (ref 0.0–0.2)

## 2023-03-14 LAB — GLUCOSE, CAPILLARY
Glucose-Capillary: 122 mg/dL — ABNORMAL HIGH (ref 70–99)
Glucose-Capillary: 109 mg/dL — ABNORMAL HIGH (ref 70–99)
Glucose-Capillary: 106 mg/dL — ABNORMAL HIGH (ref 70–99)
Glucose-Capillary: 114 mg/dL — ABNORMAL HIGH (ref 70–99)
Glucose-Capillary: 95 mg/dL (ref 70–99)

## 2023-03-14 LAB — BRAIN NATRIURETIC PEPTIDE: B Natriuretic Peptide: 563.9 pg/mL — ABNORMAL HIGH (ref 0.0–100.0)

## 2023-03-14 LAB — HEPARIN LEVEL (UNFRACTIONATED)
Heparin Unfractionated: 0.2 [IU]/mL — ABNORMAL LOW (ref 0.30–0.70)
Heparin Unfractionated: 0.2 [IU]/mL — ABNORMAL LOW (ref 0.30–0.70)

## 2023-03-14 LAB — PROCALCITONIN: Procalcitonin: <0.1 ng/mL

## 2023-03-14 LAB — MAGNESIUM: Magnesium: 2.1 mg/dL (ref 1.7–2.4)

## 2023-03-14 MED ORDER — ACETAMINOPHEN 500 MG PO TABS
1000.0000 mg | ORAL_TABLET | Freq: Three times a day (TID) | ORAL | Status: DC
  Administered 2023-03-14 – 2023-03-16 (×6): 1000 mg
  Filled 2023-03-14 (×6): qty 2

## 2023-03-14 MED ORDER — METOPROLOL TARTRATE 5 MG/5ML IV SOLN: 5.0000 mg | INTRAVENOUS | Status: DC | PRN

## 2023-03-14 MED ORDER — SODIUM CHLORIDE 0.9 % IV SOLN
1.0000 g | Freq: Every day | INTRAVENOUS | Status: DC
  Administered 2023-03-14: 1 g via INTRAVENOUS
  Filled 2023-03-14: qty 10

## 2023-03-14 MED ORDER — GUAIFENESIN 100 MG/5ML PO LIQD: 5.0000 mL | ORAL | Status: DC | PRN

## 2023-03-14 MED ORDER — SODIUM CHLORIDE 0.9 % IV SOLN
500.0000 mg | Freq: Every day | INTRAVENOUS | Status: DC
  Administered 2023-03-14: 500 mg via INTRAVENOUS
  Filled 2023-03-14: qty 5

## 2023-03-14 MED ORDER — FUROSEMIDE 10 MG/ML IJ SOLN
40.0000 mg | Freq: Once | INTRAMUSCULAR | Status: AC
  Administered 2023-03-14: 40 mg via INTRAVENOUS
  Filled 2023-03-14: qty 4

## 2023-03-14 MED ORDER — ACETAMINOPHEN 325 MG PO TABS: 650.0000 mg | ORAL_TABLET | Freq: Four times a day (QID) | ORAL | Status: DC | PRN

## 2023-03-14 MED ORDER — IPRATROPIUM-ALBUTEROL 0.5-2.5 (3) MG/3ML IN SOLN
3.0000 mL | RESPIRATORY_TRACT | Status: DC | PRN
  Administered 2023-03-14: 3 mL via RESPIRATORY_TRACT
  Filled 2023-03-14: qty 3

## 2023-03-14 MED ORDER — IPRATROPIUM BROMIDE 0.02 % IN SOLN
0.5000 mg | Freq: Two times a day (BID) | RESPIRATORY_TRACT | Status: DC
  Administered 2023-03-14 – 2023-03-23 (×20): 0.5 mg via RESPIRATORY_TRACT
  Filled 2023-03-14 (×19): qty 2.5

## 2023-03-14 MED ORDER — SODIUM CHLORIDE 0.9 % IV SOLN: INTRAVENOUS | Status: DC | PRN

## 2023-03-14 MED ORDER — SENNOSIDES-DOCUSATE SODIUM 8.6-50 MG PO TABS: 1.0000 | ORAL_TABLET | Freq: Every evening | ORAL | Status: DC | PRN

## 2023-03-14 MED ORDER — TRAZODONE HCL 50 MG PO TABS
50.0000 mg | ORAL_TABLET | Freq: Every evening | ORAL | Status: DC | PRN
  Administered 2023-03-15 – 2023-03-17 (×3): 50 mg via ORAL
  Filled 2023-03-14 (×3): qty 1

## 2023-03-14 MED ORDER — JUVEN PO PACK
1.0000 | PACK | Freq: Two times a day (BID) | ORAL | Status: DC
  Administered 2023-03-14 – 2023-03-21 (×14): 1
  Filled 2023-03-14 (×14): qty 1

## 2023-03-14 MED ORDER — HYDRALAZINE HCL 20 MG/ML IJ SOLN: 10.0000 mg | INTRAMUSCULAR | Status: DC | PRN

## 2023-03-14 MED ORDER — LEVALBUTEROL HCL 1.25 MG/0.5ML IN NEBU
1.2500 mg | INHALATION_SOLUTION | Freq: Two times a day (BID) | RESPIRATORY_TRACT | Status: DC
  Administered 2023-03-14 – 2023-03-23 (×17): 1.25 mg via RESPIRATORY_TRACT
  Filled 2023-03-14 (×23): qty 0.5

## 2023-03-14 NOTE — Progress Notes (Signed)
PROGRESS NOTE    Stephen Flynn  VHQ:[id number] DOB: Jul 16, 1927 DOA: 02/12/2023 PCP: Creola Corn, MD    Brief Narrative:  89 year old male with history of persistent atrial fibrillation on Eliquis, wheelchair-bound, chronic diastolic heart failure, hypertension, unspecified CVA, BPH, diabetes mellitus type 2, thoracic AAA presented with worsening abdominal pain and no bowel movement 3 days prior to presentation.  On presentation, he was in A-fib with RVR requiring Cardizem and heparin drips.  CT of abdomen and pelvis showed small bowel obstruction and moderate ascites.  General surgery initially recommended conservative management with NG tube decompression; he was initially admitted to [facility] service.  Subsequently, he became confused/nonverbal with hypotension and desaturation with increased abdominal tenderness.  He was transferred to ICU.  He underwent exploratory laparotomy and small bowel resection on 02/15/2023; returned back to ICU intubated.  He was extubated on 02/27/2023.  He was reintubated on 03/04/2023.  He had bronchoscopy with BAL on 03/04/2023.  Received 1 unit packed red cells on 03/08/2023 for hemoglobin of 6.6.  He was extubated again on 03/09/2023. He was on TPN which has already been discontinued.  He is currently on cortrak feedings.  He was transferred to [facility] service from 03/13/2023 onwards.   Assessment & Plan:   Small bowel obstruction status post expiratory laparotomy and small bowel resection -Status post expiratory laparotomy with bowel resection in 8/26.  Wound VAC and postop management per general surgery. -Currently on tube feeds   Acute respiratory failure with hypoxia and hypercapnia Septic shock: Present on admission, resolved Aspiration pneumonia -Concerns of fluid overload and aspiration.  Patient has now been extubated since 9/6.  Bronchodilators.  I-S/flutter valve.  Completed antibiotics meropenem (9/1 - 9/7) Possible aspiration again overnight, will repeat BNP/ProCal.  Hold further Abx for now.   Acute metabolic and septic encephalopathy -Waxing and waning mental status.  Still slow to respond.  Monitor. -Follow-up precautions.   Persistent A-fib with RVR -Cardiology team following.  Continue heparin, plans for Xarelto when able to take p.o.   Acute systolic heart failure in a patient with history of chronic diastolic heart failure, EF 45% Echo shows EF of 45%.  Plans for GDMT.  Add ARB when able   AKI Resolved   Hypernatremia Sodium improving, continue free water   Diabetes mellitus type 2 -A1c 6.5.  Continue CBGs with SSI.   Anemia of chronic disease Anemia due to critical illness -Required 1 unit packed red cell transfusion on 03/08/2023 for hemoglobin of 6.6.  Hemoglobin 7.3 today.  Transfuse if hemoglobin is less than 7 or if there is any active bleeding.   Generalized deconditioning Baseline debility: Wheelchair-bound Goals of care -Overall prognosis is guarded to poor.  Currently listed as full code.  Consult palliative care for goals of care discussion   Moderate malnutrition -Follow dietitian/nutrition recommendations.  TPN discontinued on 03/09/2023. -Currently on cortrak feeds   Obesity -Outpatient follow-up   DVT prophylaxis: Heparin drip Code Status: Full Family Communication: None at bedside Disposition Plan: Status is: Inpatient Remains inpatient appropriate because: Of severity of illness             Subjective: Seen at bedside.  No new complaints, drowsy.   Examination:  General exam: Appears calm and comfortable, 2 L nasal cannula Respiratory system: Some rhonchi at the bases bilaterally Cardiovascular system: S1 & S2 heard, RRR. No JVD, murmurs, rubs, gallops or clicks. No pedal edema. Gastrointestinal system: Abdomen is nondistended, soft and nontender. No organomegaly or masses felt. Normal bowel sounds  heard. Central nervous system: Alert and oriented to name Extremities: Symmetric 4 x 5  power. Skin: No rashes, lesions or ulcers Psychiatry: Judgement and insight appear poor         Objective: Vitals:   03/14/23 0727 03/14/23 0858 03/14/23 0900 03/14/23 1126  BP: 130/83   (!) 140/96  Pulse: 69  77 71  Resp: 20  18 20   Temp: 98.1 F (36.7 C)   98.1 F (36.7 C)  TempSrc: Oral   Axillary  SpO2: 95% 96% 96% 96%  Weight:      Height:        Intake/Output Summary (Last 24 hours) at 03/14/2023 1250 Last data filed at 03/14/2023 1127 Gross per 24 hour  Intake 0 ml  Output 1400 ml  Net -1400 ml   Filed Weights   03/12/23 0500 03/13/23 0412 03/14/23 0451  Weight: 103.2 kg 109.6 kg 110.5 kg    Scheduled Meds:  acetaminophen  1,000 mg Per Tube TID   amiodarone  200 mg Per Tube BID   Chlorhexidine Gluconate Cloth  6 each Topical Daily   docusate  100 mg Per Tube BID   feeding supplement (PROSource TF20)  60 mL Per Tube Daily   free water  200 mL Per Tube Q4H   ipratropium  0.5 mg Nebulization BID   levalbuterol  1.25 mg Nebulization BID   nutrition supplement (JUVEN)  1 packet Per Tube BID BM   mouth rinse  15 mL Mouth Rinse Q2H   polyethylene glycol  17 g Per Tube Daily   scopolamine  1 patch Transdermal Q72H   sodium chloride flush  10-40 mL Intracatheter Q12H   Continuous Infusions:  sodium chloride 10 mL/hr at 03/14/23 0654   sodium chloride 10 mL/hr at 03/14/23 0705   feeding supplement (OSMOLITE 1.5 CAL) Stopped (03/14/23 0425)   heparin 1,000 Units/hr (03/14/23 1001)    Nutritional status Signs/Symptoms: mild fat depletion, severe muscle depletion Interventions: Refer to RD note for recommendations Body mass index is 35.96 kg/m.  Data Reviewed:   CBC: Recent Labs  Lab 03/10/23 0205 03/11/23 0200 03/12/23 0430 03/13/23 0655 03/14/23 0620  WBC 6.6 5.8 4.5 4.3 4.6  HGB 7.3* 7.2* 7.4* 7.4* 7.3*  HCT 23.0* 22.8* 24.0* 24.3* 24.3*  MCV 95.0 96.6 99.2 97.6 99.2  PLT 240 238 240 251 245   Basic Metabolic Panel: Recent Labs  Lab  03/08/23 0603 03/09/23 0419 03/10/23 0205 03/11/23 0200 03/12/23 0430 03/14/23 0620  NA 144 143 145 147* 150* 148*  K 4.5 4.4 4.7 4.6 4.3 4.1  CL 112* 110 111 116* 116* 113*  CO2 24 27 25 25 28 30   GLUCOSE 144* 140* 109* 125* 87 120*  BUN 89* 87* 86* 82* 70* 60*  CREATININE 1.16 1.15 1.14 1.14 1.06 1.15  CALCIUM 8.9 8.7* 8.8* 8.9 9.2 9.1  MG 2.4 2.4 2.4  --   --  2.1  PHOS 4.2 3.7 4.1  --   --   --    GFR: Estimated Creatinine Clearance: 49.2 mL/min (by C-G formula based on SCr of 1.15 mg/dL). Liver Function Tests: Recent Labs  Lab 03/08/23 0603 03/14/23 0620  AST 21 20  ALT 18 21  ALKPHOS 41 57  BILITOT 0.9 0.5  PROT 5.3* 6.4*  ALBUMIN 2.0* 2.4*   No results for input(s): "LIPASE", "AMYLASE" in the last 168 hours. No results for input(s): "AMMONIA" in the last 168 hours. Coagulation Profile: No results for input(s): "INR", "PROTIME" in the  last 168 hours. Cardiac Enzymes: No results for input(s): "CKTOTAL", "CKMB", "CKMBINDEX", "TROPONINI" in the last 168 hours. BNP (last 3 results) No results for input(s): "PROBNP" in the last 8760 hours. HbA1C: No results for input(s): "HGBA1C" in the last 72 hours. CBG: Recent Labs  Lab 03/13/23 2017 03/13/23 2346 03/14/23 0417 03/14/23 0729 03/14/23 1124  GLUCAP 131* 135* 122* 109* 106*   Lipid Profile: No results for input(s): "CHOL", "HDL", "LDLCALC", "TRIG", "CHOLHDL", "LDLDIRECT" in the last 72 hours. Thyroid Function Tests: No results for input(s): "TSH", "T4TOTAL", "FREET4", "T3FREE", "THYROIDAB" in the last 72 hours. Anemia Panel: No results for input(s): "VITAMINB12", "FOLATE", "FERRITIN", "TIBC", "IRON", "RETICCTPCT" in the last 72 hours. Sepsis Labs: Recent Labs  Lab 03/14/23 0620  PROCALCITON <0.10    No results found for this or any previous visit (from the past 240 hour(s)).       Radiology Studies: DG Abd Portable 1V  Result Date: 03/14/2023 CLINICAL DATA:  [id number] Encounter for feeding tube  placement [id number] EXAM: PORTABLE ABDOMEN - 1 VIEW COMPARISON:  X-ray abdomen 03/12/2023 FINDINGS: Two consecutive radiographs of the abdomen with the most recent radiograph (10:47 a.m.) demonstrating enteric tube overlying the expected region of the third portion of the duodenum. (Interval retraction of the enteric tube from the x-ray abdomen 03/14/2023 10:07 a.m.). The bowel gas pattern is normal. No radio-opaque calculi or other significant radiographic abnormality are seen. IMPRESSION: 1. Two consecutive radiographs of the abdomen with the most recent radiograph (10:47 a.m.) demonstrating enteric tube overlying the expected region of the third portion of the duodenum. 2. Nonobstructive bowel gas pattern. Electronically Signed   By: Tish Frederickson M.D.   On: 03/14/2023 11:05   DG Abd Portable 1V  Result Date: 03/14/2023 CLINICAL DATA:  [id number] Encounter for feeding tube placement [id number] EXAM: PORTABLE ABDOMEN - 1 VIEW COMPARISON:  X-ray abdomen 03/12/2023 FINDINGS: Two consecutive radiographs of the abdomen with the most recent radiograph (10:47 a.m.) demonstrating enteric tube overlying the expected region of the third portion of the duodenum. (Interval retraction of the enteric tube from the x-ray abdomen 03/14/2023 10:07 a.m.). The bowel gas pattern is normal. No radio-opaque calculi or other significant radiographic abnormality are seen. IMPRESSION: 1. Two consecutive radiographs of the abdomen with the most recent radiograph (10:47 a.m.) demonstrating enteric tube overlying the expected region of the third portion of the duodenum. 2. Nonobstructive bowel gas pattern. Electronically Signed   By: Tish Frederickson M.D.   On: 03/14/2023 11:05   DG CHEST PORT 1 VIEW  Result Date: 03/14/2023 CLINICAL DATA:  Acute respiratory distress ,cough,congestion EXAM: PORTABLE CHEST 1 VIEW COMPARISON:  Chest x-ray 03/08/2023, CT chest plain 05/16/2019 FINDINGS: Right PICC tip overlying the expected region the superior  vena cava. Enteric tube courses below the hemidiaphragm with tip collimated off view. Interval removal of an endotracheal tube. Stable enlarged ascending thoracic aorta and aortic knob. Persistently enlarged cardiac silhouette. The heart and mediastinal contours are unchanged. Aortic calcification. Interval development of prominent hilar vasculature. Left hilar airspace opacity suggestive of prominent hilar vasculature versus developing consolidation. Increased interstitial markings. No pleural effusion. No pneumothorax. No acute osseous abnormality. IMPRESSION: 1. Interval removal of an endotracheal tube. 2. Persistently enlarged cardiac silhouette-underlying pericardial effusion not excluded. 3. Stable enlarged ascending thoracic aorta in aortic knob-limited radiographic evaluation of known aneurysmal ascending thoracic aorta. 4. Pulmonary edema. Superimposed infection/inflammation not excluded. Left hilar airspace opacity suggestive of prominent hilar vasculature versus developing consolidation. Electronically Signed   By: Normajean Glasgow.D.  On: 03/14/2023 06:17   DG Abd Portable 1V  Result Date: 03/12/2023 CLINICAL DATA:  Nasogastric tube placement EXAM: PORTABLE ABDOMEN - 1 VIEW COMPARISON:  03/11/2023 FINDINGS: Nasoenteric feeding tube is been advanced and is now seen looped within the gastric lumen with its tip in the expected region of the gastric antrum. Visualized abdominal gas pattern is nonobstructive. Pelvis excluded from view. No free intraperitoneal gas. IMPRESSION: 1. Nasoenteric feeding tube looped within the gastric lumen, tip in the expected region of the gastric antrum. Electronically Signed   By: Helyn Numbers M.D.   On: 03/12/2023 18:51   CT HEAD WO CONTRAST ( )  Result Date: 03/12/2023 CLINICAL DATA:  Neuro deficit, stroke suspected EXAM: CT HEAD WITHOUT CONTRAST TECHNIQUE: Contiguous axial images were obtained from the base of the skull through the vertex without intravenous  contrast. RADIATION DOSE REDUCTION: This exam was performed according to the departmental dose-optimization program which includes automated exposure control, adjustment of the mA and/or kV according to patient size and/or use of iterative reconstruction technique. COMPARISON:  Brain MRI 01/05/2023 FINDINGS: Brain: There is no acute intracranial hemorrhage, extra-axial fluid collection, or acute infarct. Parenchymal volume loss with prominence of the ventricular system and extra-axial CSF spaces is unchanged. The ventricles are stable in size. Patchy hypodensity in the supratentorial white matter consistent with underlying chronic small-vessel ischemic change is stable. The pituitary and suprasellar region are normal. There is no mass lesion. There is no mass effect or midline shift. Vascular: No hyperdense vessel or unexpected calcification. Skull: Normal. Negative for fracture or focal lesion. Sinuses/Orbits: There is mucosal thickening throughout the paranasal sinuses, improved since the prior study. There are new bilateral mastoid and left middle ear effusions, nonspecific. No obstructing nasopharyngeal lesion is seen. The globes and orbits are unremarkable. 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 IMPRESSION: 1. Stable noncontrast appearance of the brain with no acute intracranial pathology. 2. New bilateral mastoid effusions. Electronically Signed   By: Lesia Hausen M.D.   On: 03/12/2023 14:17           LOS: 17 days   Time spent= 35 mins    Miguel Rota, MD [facility]  If 7PM-7AM, please contact night-coverage  03/14/2023, 12:50 PM

## 2023-03-14 NOTE — Consult Note (Signed)
WOC Nurse wound follow up Wound type: surgical, midline  Measurement: 12cm x 2.0cm x 0.7cm  Wound bed:100% granulation; moist, pink Drainage (amount, consistency, odor) clean, pink, moist Periwound:intact  Dressing procedure/placement/frequency: Removed old NPWT dressing (1 pc black) Filled wound with 1____ piece of black foam  Sealed NPWT dressing at HG Patient received IV pain medication per bedside nurse prior to dressing change Patient tolerated procedure well  Ok for bedside nurse to change non-complex NPWT dressing M/W/F.    Re consult if needed, will not follow at this time. Thanks  Sylvi Rybolt [employer], RN,CWOCN, CNS, CWON-AP [phone])

## 2023-03-14 NOTE — Progress Notes (Addendum)
Possible aspiration.  Tube feeding stopped. Scheduled and as needed nebulizers  CXR . Interval removal of an endotracheal tube. 2. Persistently enlarged cardiac silhouette-underlying pericardial effusion not excluded. 3. Stable enlarged ascending thoracic aorta in aortic knob-limited radiographic evaluation of known aneurysmal ascending thoracic aorta. 4. Pulmonary edema. Superimposed infection/inflammation not excluded. Left hilar airspace opacity suggestive of prominent hilar vasculature versus developing consolidation.  Blood cultures collected, Rocephin and azithromycin ordered Lasix 40mg  IV x !

## 2023-03-14 NOTE — Progress Notes (Addendum)
Cortrak Tube Team Note:  Consult received to advance an existing Cortrak feeding tube post-pyloric.  RD reinserted stylet. Able to remove large amount of coiling/slack in stomach. Cortrak tube is now at 96 cm in the left nare.  ADDENDUM 1022: Cortrak appeared to fold back on itself in small bowel per x-ray. Tube slightly difficult to flush. RD readjusted tube. Tube is now at 89 cm in the left nare abd flushes without difficulty. Repeat abdominal x-ray obtained.  X-ray is required, abdominal x-ray has been ordered by the Cortrak team. Please confirm tube placement before using the Cortrak tube.   If the tube becomes dislodged please keep the tube and contact the Cortrak team at [email or web address] for replacement.  If after hours and replacement cannot be delayed, place a NG tube and confirm placement with an abdominal x-ray.    Mertie Clause, MS, RD, LDN Registered Dietitian II Please see AMiON for contact information.

## 2023-03-14 NOTE — Progress Notes (Signed)
Nutrition Follow-up  DOCUMENTATION CODES:  Non-severe (moderate) malnutrition in context of chronic illness  INTERVENTION:  Restart tube feeding via cortrak tube: Osmolite 1.5 at 50 ml/h (1200 ml per day) Prosource TF20 60 ml daily Provides 1880 kcal, 95 gm protein, 914 ml free water daily  1 packet Juven BID via Cortrak tube, each packet provides 95 calories, 2.5 grams of protein (collagen), and 9.8 grams of carbohydrate (3 grams sugar); also contains 7 grams of L-arginine and L-glutamine, 300 mg vitamin C, 15 mg vitamin E, 1.2 mcg vitamin B-12, 9.5 mg zinc, 200 mg calcium, and 1.5 g  Calcium Beta-hydroxy-Beta-methylbutyrate to support wound healing  Due to the length of remaining bowel, pt is at high risk for short bowel syndrome and may require life long TPN. Will monitor for signs of malabsorptive state  NUTRITION DIAGNOSIS:  Moderate Malnutrition related to chronic illness as evidenced by mild fat depletion, severe muscle depletion. -ongoing  GOAL:  Patient will meet greater than or equal to 90% of their needs - progressing, TF to meet all nutrition needs  MONITOR:  I & O's, Vent status, Labs, Weight trends  REASON FOR ASSESSMENT:  Consult Enteral/tube feeding initiation and management (gradual advancement as tolerated)  ASSESSMENT:  Pt with hx of HTN, HLD, CVA, CHF, DM type 2, atrial fibrillation, and AAA presented to ED with abdominal pain. Imaging showed a SBO. Initially treated conservatively but ultimately developed hypotension and was transferred to ICU and then taken to OR for repair.  8/25 - admitted with SBO 8/26 - Op, exploratory laparotomy with bowel resection, Small bowel was resected from the distal jejunum to the distal ileum. ~120cm of remaining bowel. PICC placed 8/27 - TPN initiated, extubated 8/28 - wound vac placed 8/30 - cortrak placed (extreme distal stomach or proximal duodenum); TF started at trickle rate 8/31 - TF increased to 56mL/hr 9/1 -  Re-intubated; TF increased to 7mL/hr 9/2 - TF held due to distension and no bowel function 9/4 - TF started at trickle rate  9/6 - extubated 9/11- cortrak advanced to post-pyloric due to reports of aspirating   Visited patient at bedside who was asleep. He had his cortrak tube adjusted to PP today. TF's have not been restarted yet. IV abx observed infusing alongside heparin.   RD informed family at bedside that RD is ordering Juven BID for wound healing. She reports that patient has been tolerating TF's as ordered.   RN to restart tube feeds. RD updated patient's RN on nutrition plan   Labs: Na 148, Glu 120, BUN 60 Meds: reviewed   Wt: admit 219#, current 227#?   I/O's: +85 ml (net cumulative)   Diet Order:   Diet Order     None       EDUCATION NEEDS:  Not appropriate for education at this time  Skin:  Skin Assessment: Reviewed RN Assessment (surgical incision, midline)  Last BM:  9/8 - type 6  Height:  Ht Readings from Last 1 Encounters:  03/09/23 5' 9.02" (1.753 m)   Weight:  Wt Readings from Last 1 Encounters:  03/14/23 110.5 kg   Ideal Body Weight:  72.7 kg  BMI:  Body mass index is 35.96 kg/m.  Estimated Nutritional Needs:  Kcal:  1800-2000 kcal/d Protein:  90-105g/d Fluid:  1.8L/d  Leodis Rains, RDN, LDN  Clinical Nutrition

## 2023-03-14 NOTE — Progress Notes (Signed)
16 Days Post-Op   Subjective/Chief Complaint: Possible aspiration overnight, coarse breath sounds and on 2L [place] this morning. Remains afebrile.   Tells me that he is in the hospital and his name is Stephen Flynn Denies pain currently   Objective: Vital signs in last 24 hours: Temp:  [97.6 F (36.4 C)-98.1 F (36.7 C)] 98.1 F (36.7 C) (09/11 0727) Pulse Rate:  [17-72] 69 (09/11 0727) Resp:  [14-20] 20 (09/11 0727) BP: (130-147)/(81-96) 130/83 (09/11 0727) SpO2:  [93 %-99 %] 95 % (09/11 0727) Weight:  [110.5 kg] 110.5 kg (09/11 0451) Last BM Date : 03/11/23  Intake/Output from previous day: 09/10 0701 - 09/11 0700 In: 0  Out: 600 [Urine:600] Intake/Output this shift: No intake/output data recorded. Alert, NAD Cortrak in place, tube feeds off Abdomen soft and nondistended. Wound vac in place on midline wound, serosanguinous fluid in canister.  Neuro: oriented to person and hospital, [facility] for me - sqeezed with his right hand  Lab Results:  Recent Labs    03/13/23 0655 03/14/23 0620  WBC 4.3 4.6  HGB 7.4* 7.3*  HCT 24.3* 24.3*  PLT 251 245   BMET Recent Labs    03/12/23 0430 03/14/23 0620  NA 150* 148*  K 4.3 4.1  CL 116* 113*  CO2 28 30  GLUCOSE 87 120*  BUN 70* 60*  CREATININE 1.06 1.15  CALCIUM 9.2 9.1   PT/INR No results for input(s): "LABPROT", "INR" in the last 72 hours. ABG No results for input(s): "PHART", "HCO3" in the last 72 hours.  Invalid input(s): "PCO2", "PO2"  Studies/Results: DG CHEST PORT 1 VIEW  Result Date: 03/14/2023 CLINICAL DATA:  Acute respiratory distress ,cough,congestion EXAM: PORTABLE CHEST 1 VIEW COMPARISON:  Chest x-ray 03/08/2023, CT chest plain 05/16/2019 FINDINGS: Right PICC tip overlying the expected region the superior vena cava. Enteric tube courses below the hemidiaphragm with tip collimated off view. Interval removal of an endotracheal tube. Stable enlarged ascending thoracic aorta and aortic knob. Persistently enlarged  cardiac silhouette. The heart and mediastinal contours are unchanged. Aortic calcification. Interval development of prominent hilar vasculature. Left hilar airspace opacity suggestive of prominent hilar vasculature versus developing consolidation. Increased interstitial markings. No pleural effusion. No pneumothorax. No acute osseous abnormality. IMPRESSION: 1. Interval removal of an endotracheal tube. 2. Persistently enlarged cardiac silhouette-underlying pericardial effusion not excluded. 3. Stable enlarged ascending thoracic aorta in aortic knob-limited radiographic evaluation of known aneurysmal ascending thoracic aorta. 4. Pulmonary edema. Superimposed infection/inflammation not excluded. Left hilar airspace opacity suggestive of prominent hilar vasculature versus developing consolidation. Electronically Signed   By: Tish Frederickson M.D.   On: 03/14/2023 06:17   DG Abd Portable 1V  Result Date: 03/12/2023 CLINICAL DATA:  Nasogastric tube placement EXAM: PORTABLE ABDOMEN - 1 VIEW COMPARISON:  03/11/2023 FINDINGS: Nasoenteric feeding tube is been advanced and is now seen looped within the gastric lumen with its tip in the expected region of the gastric antrum. Visualized abdominal gas pattern is nonobstructive. Pelvis excluded from view. No free intraperitoneal gas. IMPRESSION: 1. Nasoenteric feeding tube looped within the gastric lumen, tip in the expected region of the gastric antrum. Electronically Signed   By: Helyn Numbers M.D.   On: 03/12/2023 18:51   CT HEAD WO CONTRAST ( )  Result Date: 03/12/2023 CLINICAL DATA:  Neuro deficit, stroke suspected EXAM: CT HEAD WITHOUT CONTRAST TECHNIQUE: Contiguous axial images were obtained from the base of the skull through the vertex without intravenous contrast. RADIATION DOSE REDUCTION: This exam was performed according to the departmental  dose-optimization program which includes automated exposure control, adjustment of the mA and/or kV according to patient  size and/or use of iterative reconstruction technique. COMPARISON:  Brain MRI 01/05/2023 FINDINGS: Brain: There is no acute intracranial hemorrhage, extra-axial fluid collection, or acute infarct. Parenchymal volume loss with prominence of the ventricular system and extra-axial CSF spaces is unchanged. The ventricles are stable in size. Patchy hypodensity in the supratentorial white matter consistent with underlying chronic small-vessel ischemic change is stable. The pituitary and suprasellar region are normal. There is no mass lesion. There is no mass effect or midline shift. Vascular: No hyperdense vessel or unexpected calcification. Skull: Normal. Negative for fracture or focal lesion. Sinuses/Orbits: There is mucosal thickening throughout the paranasal sinuses, improved since the prior study. There are new bilateral mastoid and left middle ear effusions, nonspecific. No obstructing nasopharyngeal lesion is seen. The globes and orbits are unremarkable. 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 IMPRESSION: 1. Stable noncontrast appearance of the brain with no acute intracranial pathology. 2. New bilateral mastoid effusions. Electronically Signed   By: Lesia Hausen M.D.   On: 03/12/2023 14:17    Anti-infectives: Anti-infectives (From admission, onward)    Start     Dose/Rate Route Frequency Ordered Stop   03/14/23 0700  cefTRIAXone (ROCEPHIN) 1 g in sodium chloride 0.9 % 100 mL IVPB  Status:  Discontinued        1 g 200 mL/hr over 30 Minutes Intravenous Daily 03/14/23 0621 03/14/23 0759   03/14/23 0630  azithromycin (ZITHROMAX) 500 mg in sodium chloride 0.9 % 250 mL IVPB  Status:  Discontinued        500 mg 250 mL/hr over 60 Minutes Intravenous Daily 03/14/23 0621 03/14/23 0759   03/04/23 1000  meropenem (MERREM) 1 g in sodium chloride 0.9 % 100 mL IVPB        1 g 200 mL/hr over 30 Minutes Intravenous Every 12 hours 03/04/23 0855 03/10/23 2132   03/01/23 1500  ceFEPIme (MAXIPIME) 2 g in sodium  chloride 0.9 % 100 mL IVPB        2 g 200 mL/hr over 30 Minutes Intravenous Every 12 hours 03/01/23 1124 03/02/23 0356   03/01/23 0300  ceFEPIme (MAXIPIME) 2 g in sodium chloride 0.9 % 100 mL IVPB  Status:  Discontinued        2 g 200 mL/hr over 30 Minutes Intravenous Every 24 hours 02/28/23 1141 03/01/23 1124   03-03-23 1445  piperacillin-tazobactam (ZOSYN) IVPB 3.375 g  Status:  Discontinued        3.375 g 100 mL/hr over 30 Minutes Intravenous Every 8 hours 2023/03/03 1436 2023/03/03 1445   2023/03/03 1445  ceFEPIme (MAXIPIME) 2 g in sodium chloride 0.9 % 100 mL IVPB  Status:  Discontinued        2 g 200 mL/hr over 30 Minutes Intravenous Every 12 hours March 03, 2023 1446 02/28/23 1141   03/03/2023 1445  metroNIDAZOLE (FLAGYL) IVPB 500 mg        500 mg 100 mL/hr over 60 Minutes Intravenous Every 12 hours 03-Mar-2023 1446 03/02/23 1719   03/03/23 1015  meropenem (MERREM) 1 g in sodium chloride 0.9 % 100 mL IVPB        1 g 200 mL/hr over 30 Minutes Intravenous  Once 03-03-23 1610 03/03/23 1743       Assessment/Plan: -POD#16 s/p Exploratory laparotomy small bowel resection and primary anastomosis Mar 03, 2023 Dr. Luisa Hart - approximately 120 cm of small bowel remaining, if patient has a lot of diarrhea  may need imodium. - secretions better, concern for aspiration, no PO intake until cleared by SLP. - Cortrak needs to be advanced to post-pyloric position to minimize aspiration risk. Ok to continue feeds once tube is advanced. - abdominal wound vac MWF   Francine Graven Coco Sharpnack PA-C 03/14/2023

## 2023-03-14 NOTE — Progress Notes (Signed)
ANTICOAGULATION CONSULT NOTE  Pharmacy Consult for Heparin Indication: atrial fibrillation  Allergies  Allergen Reactions   Penicillin G     Other reaction(s): Drowsy   Penicillins Hives    Did it involve swelling of the face/tongue/throat, SOB, or low BP? No Did it involve sudden or severe rash/hives, skin peeling, or any reaction on the inside of your mouth or nose? yes Did you need to seek medical attention at a hospital or doctor's office? yes When did it last happen?  child     If all above answers are "NO", may proceed with cephalosporin use.    Patient Measurements: Height: 5' 9.02" (175.3 cm) Weight: 110.5 kg (243 lb 9.7 oz) IBW/kg (Calculated) : 70.74 Heparin Dosing Weight: 95 kg  Vital Signs: Temp: 98.1 F (36.7 C) (09/11 0727) Temp Source: Oral (09/11 0727) BP: 130/83 (09/11 0727) Pulse Rate: 77 (09/11 0900)  Labs: Recent Labs    03/12/23 0430 03/12/23 1756 03/13/23 0655 03/14/23 0620  HGB 7.4*  --  7.4* 7.3*  HCT 24.0*  --  24.3* 24.3*  PLT 240  --  251 245  APTT 90*  --   --   --   HEPARINUNFRC 0.57 0.38 0.35 0.20*  CREATININE 1.06  --   --  1.15    Estimated Creatinine Clearance: 49.2 mL/min (by C-G formula based on SCr of 1.15 mg/dL).   Medical History: Past Medical History:  Diagnosis Date   Anemia    Aortic insufficiency    Chest pain    H/O: GI bleed    Hyperlipidemia    Hypertension    Leg cramps    Stroke [facility])     Assessment: 89 YOM s/p SBR on Eliqius for atrial fibrillation. Tracheostomy planned 9/9 - plan to hold Eliquis and start heparin until trach completed.  -Heparin level subtherapeutic this AM at 0.20 on 850 units/hr; hg= 7.4 -plans noted to move cortrack to post-pyloric position   Goal of Therapy:  Heparin level 0.3-0.5 units/ml aPTT 66-85 seconds Monitor platelets by anticoagulation protocol: Yes   Plan:  -Increase heparin to 1000 units.hr -Heparin level in 8 hrs -Please note if cortrak is moved to post-pyloric,  Eliquis should not be given due to poor absorption.Would plan for warfarin   Harland German, PharmD Clinical Pharmacist **Pharmacist phone directory can now be found on [email or web address] ([email or web address]).  Listed under [facility].

## 2023-03-14 NOTE — Hospital Course (Addendum)
Brief Narrative:  89 year old male with history of persistent atrial fibrillation on Eliquis, wheelchair-bound, chronic diastolic heart failure, hypertension, unspecified CVA, BPH, diabetes mellitus type 2, thoracic AAA presented with worsening abdominal pain and no bowel movement 3 days prior to presentation.  On presentation, he was in A-fib with RVR requiring Cardizem and heparin drips.  CT of abdomen and pelvis showed small bowel obstruction and moderate ascites.  General surgery initially recommended conservative management with NG tube decompression; he was initially admitted to [facility] service.  Subsequently, he became confused/nonverbal with hypotension and desaturation with increased abdominal tenderness.  He was transferred to ICU.  He underwent exploratory laparotomy and small bowel resection on 03/03/2023; returned back to ICU intubated.  He was extubated on 02/27/2023.  He was reintubated on 03/04/2023.  He had bronchoscopy with BAL on 03/04/2023.  Received 1 unit packed red cells on 03/08/2023 for hemoglobin of 6.6.  He was extubated again on 03/09/2023. He was on TPN which has already been discontinued.  He is currently on cortrak feedings.  He was transferred to [facility] service from 03/13/2023 onwards.   Assessment & Plan:   Small bowel obstruction status post expiratory laparotomy and small bowel resection -Status post expiratory laparotomy with bowel resection in 8/26.  Wound VAC and postop management per general surgery. -Currently on tube feeds   Acute respiratory failure with hypoxia and hypercapnia Septic shock: Present on admission, resolved Aspiration pneumonia -Concerns of fluid overload and aspiration.  Patient has now been extubated since 9/6.  Bronchodilators.  I-S/flutter valve.  Completed antibiotics meropenem (9/1 - 9/7) Possible aspiration again overnight, will repeat BNP/ProCal. Hold further Abx for now.   Acute metabolic and septic encephalopathy -Waxing and waning mental status.   Still slow to respond.  Monitor. -Follow-up precautions.   Persistent A-fib with RVR -Cardiology team following.  Continue heparin, plans for Xarelto when able to take p.o.   Acute systolic heart failure in a patient with history of chronic diastolic heart failure, EF 45% Echo shows EF of 45%.  Plans for GDMT.  Add ARB when able   AKI Resolved   Hypernatremia Sodium improving, continue free water   Diabetes mellitus type 2 -A1c 6.5.  Continue CBGs with SSI.   Anemia of chronic disease Anemia due to critical illness -Required 1 unit packed red cell transfusion on 03/08/2023 for hemoglobin of 6.6.  Hemoglobin 7.3 today.  Transfuse if hemoglobin is less than 7 or if there is any active bleeding.   Generalized deconditioning Baseline debility: Wheelchair-bound Goals of care -Overall prognosis is guarded to poor.  Currently listed as full code.  Consult palliative care for goals of care discussion   Moderate malnutrition -Follow dietitian/nutrition recommendations.  TPN discontinued on 03/09/2023. -Currently on cortrak feeds   Obesity -Outpatient follow-up   DVT prophylaxis: Heparin drip Code Status: Full Family Communication: None at bedside Disposition Plan: Status is: Inpatient Remains inpatient appropriate because: Of severity of illness

## 2023-03-14 NOTE — Progress Notes (Signed)
Daily Progress Note   Patient Name: Stephen Flynn       Date: 03/14/2023 DOB: [birth year]  Age: 89 y.o. MRN#: [id number] Attending Physician: Miguel Rota, MD Primary Care Physician: Creola Corn, MD Admit Date: 02/15/2023  Reason for Consultation/Follow-up: Establishing goals of care  Subjective: Medical records reviewed including progress notes, labs, imaging. Patient assessed at the bedside.  He is resting and in no acute distress, occasionally grimaces.  His daughter-in-law [place] is present visiting.  Introduced to role of palliative care as an extra layer of support to patient's daughter-in-law, she is initially surprised by teams involvement.  We discussed her concern that patient may have unaddressed right hip pain, as this is a chronic issue for him.  Reviewed MAR and medications currently on board, discussing option of scheduling Tylenol given that patient cannot request pain medicines at this time.  Reviewed recent abdominal x-ray which showed enteric tube was overlying the expected region.  Patient's daughter-in-law notes her feeling that he would not likely find his current quality of life to be acceptable.  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  She has a good understanding the severity of his illness and that this may be his new baseline.  She is worried that patient's biological children do not fully grasp this.  I shared that I would continue attempts to coordinate a family meeting to provide support as they come to terms with the patient's prognosis and expected disease trajectory.  I then called patient's wife Stephen [place] to provide updates on overnight aspiration, providing my recommendation for another family meeting to discuss his care moving  forward.  She shares that her daughter is currently trying to coordinate with her sons, particularly her son who lives in [place], to be present.  She requested I call her daughter and she will try to visit as he is possible.    I attempted to call patient's daughter but was unable to reach and unable to leave a voicemail as it was not set up.  Questions and concerns addressed. PMT will continue to support holistically.   Length of Stay: 17   Physical Exam Vitals and nursing note reviewed.  Constitutional:      General: He is not in acute distress.    Appearance: He is ill-appearing.  Interventions: Nasal cannula in place.     Comments: 2L  Cardiovascular:     Rate and Rhythm: Normal rate.  Pulmonary:     Effort: Pulmonary effort is normal. No respiratory distress.  Psychiatric:        Cognition and Memory: Cognition is impaired.             Vital Signs: BP 130/83 (BP Location: Right Leg)   Pulse 77   Temp 98.1 F (36.7 C) (Oral)   Resp 18   Ht 5' 9.02" (1.753 m)   Wt 110.5 kg   SpO2 96%   BMI 35.96 kg/m  SpO2: SpO2: 96 % O2 Device: O2 Device: Nasal Cannula O2 Flow Rate: O2 Flow Rate (L/min): 2 L/min      Palliative Assessment/Data: 10% (tube feeds currently held)    Palliative Care Assessment & Plan   Patient Profile: 89 y.o. male  with past medical history of persistent A-fib on Eliquis, diastolic CHF, CVA, BPH, DM-2, thoracic AAA and debility with wheelchair dependence, poorly controlled hypertension admitted on 03/08/2023 with acute abdominal pain.    In the ED, patient was in A-fib with RVR and CT abdomen and pelvis showed small bowel obstruction, moderate ascites and BPH.  Initially was treated with conservative management and NG tube decompression for small bowel obstruction, however on 8/26 he required pressor support and underwent exploratory laparotomy and small bowel resection.  Echo showed EF of 40 to 45%.   Patient was extubated on 8/27 then  reintubated on 9/1.  Most recently extubated on 9/6. PCCM has discussed possible tracheostomy with patient's family. PMT has been consulted to assist with goals of care conversation on hospital day 16 after transfer to the ICU.  Assessment: Goals of care conversation Small bowel obstruction status post exploratory laparotomy and small bowel resection with primary anastomosis A-fib with RVR Acute respiratory failure with hypoxia and hypercapnia, improved Acute metabolic and septic encephalopathy  Recommendations/Plan: Continue full code/full scope treatment Continue efforts to coordinate a family meeting for goals of care discussion.  Unable to reach patient's daughter today and patient's wife has been deferring to her for coordination of meeting with all adult children Patient's daughter-in-law has a realistic understanding of the severity of patient's illness; she does not feel he would find his current quality of life to be scheduled.  She deferred to patient's wife and biological children Psychosocial and emotional support provided PMT will continue to follow and support   Prognosis: Poor  Discharge Planning: To Be Determined  Care plan was discussed with patient's daughter-in-law, patient's wife   Total time: I spent 50 minutes in the care of the patient today in the above activities and documenting the encounter.  MDM high         Shaylynne Lunt Jeni Salles, PA-C  Palliative Medicine Team Team phone # [phone]  Thank you for allowing the Palliative Medicine Team to assist in the care of this patient. Please utilize secure chat with additional questions, if there is no response within 30 minutes please call the above phone number.  Palliative Medicine Team providers are available by phone from 7am to 7pm daily and can be reached through the team cell phone.  Should this patient require assistance outside of these hours, please call the patient's attending physician.

## 2023-03-14 NOTE — Progress Notes (Signed)
Occupational Therapy Treatment Patient Details Name: Stephen Flynn MRN: [id number] DOB: 05/25/1928 Today's Date: 03/14/2023   History of present illness 89 y/o male admitted 8/25 with abdominal pain, SOB with bowel obstruction. 8/26 developed hypotension and required ex-lap with small bowel resection, remained intubated post op. Extubated 8/27. Reintubated 9/1-9/6. 9/9 with R head turn and decr cognition noted from therapist but MRI negative. PMhx: GIB, stroke, HTN, Afib, neuropathy, CHF, obesity   OT comments  Pt with slow progress toward established OT goals and OT recommending goal update next session if needed; continuing to require more assist for cognition, attention, following commands, communication than at re-eval. Pt required total A for all repositioning in bed and grooming at bed level. Pt with preference for R head turn and needing external assist to achieve midline cervical position with discomfort at midline. Pt following ~2 commands this session and needing repeated cues throughout with verbal responses to questions, but unintelligible language. Meaningful engagement in movement to internal stimuli (itching), but otherwise little goal directed action and often resistive to therapist facilitated AAROM. Limited visual engagement with therapist with visual location to name call and max cues but not sustaining eye contact with therapist. Continuing to recommend intensive inpatient rehabilitation.       If plan is discharge home, recommend the following:  Two people to help with walking and/or transfers;Two people to help with bathing/dressing/bathroom;Assistance with cooking/housework;Assistance with feeding;Direct supervision/assist for medications management;Direct supervision/assist for financial management;Assist for transportation;Help with stairs or ramp for entrance   Equipment Recommendations  Other (comment)    Recommendations for Other Services Rehab consult    Precautions /  Restrictions Precautions Precautions: Fall;Other (comment) Precaution Comments: cortrak, wound vac Restrictions Weight Bearing Restrictions: No       Mobility Bed Mobility Overal bed mobility: Needs Assistance Bed Mobility: Rolling Rolling: Total assist         General bed mobility comments: use of bed features to facilitate move to chair position. Once in chair position, poor tolerance of use of bed pad to facilitate upright positioning of pt with back off bed    Transfers                   General transfer comment: NT     Balance                                           ADL either performed or assessed with clinical judgement   ADL Overall ADL's : Needs assistance/impaired     Grooming: Total assistance;Bed level Grooming Details (indicate cue type and reason): washing face with pt grasping therapist arm                                    Extremity/Trunk Assessment Upper Extremity Assessment Upper Extremity Assessment: RUE deficits/detail;LUE deficits/detail RUE Deficits / Details: Resistive to most movement; seems to be painful. Internal stim brings about self directed action (feeling need to scratch L claviclular area), and pt able to do so, but then later unable to reach face. At beginning of session, willingly holding therapist's arm and engaged in washing face, but pushing away after wiping face 3x. RUE Coordination: decreased fine motor;decreased gross motor LUE Deficits / Details: Extremely edematous, poor attention to L. Most spontaneous movement occurred in pt attempt to reach  for therapist with cervical ROM to midline from R   Lower Extremity Assessment Lower Extremity Assessment: Defer to PT evaluation        Vision   Vision Assessment?: Vision impaired- to be further tested in functional context Additional Comments: Pt able to attend to therapist in previous sessions with short bouts of sustained attention;  focused visual attention with pt unable to attend for more than ~1 second after visually locating therapist face and doing so very inconsistently   Perception     Praxis      Cognition Arousal: Alert Behavior During Therapy: Flat affect Overall Cognitive Status: Difficult to assess                                 General Comments: Pt with poor command following this session, and intermittently seeming resistant to therapist guidance of movement.        Exercises Exercises: Other exercises Other Exercises Other Exercises: gently cervical rotation toward L to midline only with pt calling out in pain; able to tolerate stretch to midline but no further to L. Repositioned with towel roll under pillow to facilitate positioning closer to midline, however, pt still noted with ~10 degree cervical rotation to L Other Exercises: bil hands AAROM composite flexion/extension with pt intermittently resisting.    Shoulder Instructions       General Comments VSS.    Pertinent Vitals/ Pain       Pain Assessment Pain Assessment: Faces Faces Pain Scale: Hurts little more Pain Location: abdomen, BUE with movement Pain Descriptors / Indicators: Discomfort, Sore Pain Intervention(s): Limited activity within patient's tolerance, Monitored during session  Home Living                                          Prior Functioning/Environment              Frequency  Min 1X/week        Progress Toward Goals  OT Goals(current goals can now be found in the care plan section)  Progress towards OT goals: OT to reassess next treatment (slowly; Increased assist for cognition and attention; seems to be improved from last session, but limted)  Acute Rehab OT Goals Patient Stated Goal: pt unable to state OT Goal Formulation: With patient Time For Goal Achievement: 03/22/23 Potential to Achieve Goals: Good ADL Goals Pt Will Perform Grooming: with contact guard  assist;sitting Pt Will Perform Upper Body Bathing: with contact guard assist;sitting Pt Will Perform Lower Body Bathing: with max assist;sitting/lateral leans;sit to/from stand Pt Will Transfer to Toilet: with max assist;with +2 assist Additional ADL Goal #1: Pt will perform bed mobility with min A as a precursor to ADL Additional ADL Goal #2: Pt will follow all one step commands without need for additional cueing.  Plan      Co-evaluation                 AM-PAC OT "6 Clicks" Daily Activity     Outcome Measure   Help from another person eating meals?: Total Help from another person taking care of personal grooming?: Total Help from another person toileting, which includes using toliet, bedpan, or urinal?: Total Help from another person bathing (including washing, rinsing, drying)?: Total Help from another person to put on and taking off regular upper body clothing?:  Total Help from another person to put on and taking off regular lower body clothing?: Total 6 Click Score: 6    End of Session    OT Visit Diagnosis: Unsteadiness on feet (R26.81);Muscle weakness (generalized) (M62.81);Other symptoms and signs involving cognitive function   Activity Tolerance Patient tolerated treatment well   Patient Left in bed;with call bell/phone within reach;with bed alarm set   Nurse Communication Mobility status;Other (comment) (Continued discussion to make RN aware of significant change in attention, cognition, communication and L inattention; RN reporting scan has been performed, but that she would continue to monitor pt.)        Time: 1400-1425 OT Time Calculation (min): 25 min  Charges: OT General Charges $OT Visit: 1 Visit OT Treatments $Therapeutic Activity: 8-22 mins  Tyler Deis, OTR/L [facility] Acute Rehabilitation Office: [phone]   Myrla Halsted 03/14/2023, 3:57 PM

## 2023-03-14 NOTE — Progress Notes (Signed)
Patient was not clearing his secretions and not cooperating with mouth suctioning. RN contacted RT and Crosley, MD, advised pt does not sound good, ronchi throughout however last CXR was clear.   MD placed new orders

## 2023-03-14 NOTE — Progress Notes (Signed)
ANTICOAGULATION CONSULT NOTE  Pharmacy Consult for Heparin Indication: atrial fibrillation  Allergies  Allergen Reactions   Penicillin G     Other reaction(s): Drowsy   Penicillins Hives    Did it involve swelling of the face/tongue/throat, SOB, or low BP? No Did it involve sudden or severe rash/hives, skin peeling, or any reaction on the inside of your mouth or nose? yes Did you need to seek medical attention at a hospital or doctor's office? yes When did it last happen?  child     If all above answers are "NO", may proceed with cephalosporin use.    Patient Measurements: Height: 5' 9.02" (175.3 cm) Weight: 110.5 kg (243 lb 9.7 oz) IBW/kg (Calculated) : 70.74 Heparin Dosing Weight: 95 kg  Vital Signs: Temp: 98.1 F (36.7 C) (09/11 0727) Temp Source: Oral (09/11 0727) BP: 130/83 (09/11 0727) Pulse Rate: 77 (09/11 0900)  Labs: Recent Labs    03/12/23 0430 03/12/23 1756 03/13/23 0655 03/14/23 0620  HGB 7.4*  --  7.4* 7.3*  HCT 24.0*  --  24.3* 24.3*  PLT 240  --  251 245  APTT 90*  --   --   --   HEPARINUNFRC 0.57 0.38 0.35 0.20*  CREATININE 1.06  --   --  1.15    Estimated Creatinine Clearance: 49.2 mL/min (by C-G formula based on SCr of 1.15 mg/dL).   Medical History: Past Medical History:  Diagnosis Date   Anemia    Aortic insufficiency    Chest pain    H/O: GI bleed    Hyperlipidemia    Hypertension    Leg cramps    Stroke [facility])     Assessment: 89 YOM s/p SBR on Eliqius for atrial fibrillation. Tracheostomy planned 9/9 - plan to hold Eliquis and start heparin until trach completed.  -Heparin level subtherapeutic this AM at 0.20 on 850 units/hr; hg= 7.4; no infusion interruptions noted per RN -plans noted to move cortrack to post-pyloric position   Goal of Therapy:  Heparin level 0.3-0.5 units/ml aPTT 66-85 seconds Monitor platelets by anticoagulation protocol: Yes   Plan:  -Increase heparin to 1000 units.hr -Heparin level in 8 hrs -Please  note if cortrak is moved to post-pyloric, Eliquis should not be given due to poor absorption.Would plan for warfarin   Harland German, PharmD Clinical Pharmacist **Pharmacist phone directory can now be found on [email or web address] ([email or web address]).  Listed under [facility].

## 2023-03-15 DIAGNOSIS — Z7189 Other specified counseling: Secondary | ICD-10-CM | POA: Diagnosis not present

## 2023-03-15 DIAGNOSIS — K56609 Unspecified intestinal obstruction, unspecified as to partial versus complete obstruction: Secondary | ICD-10-CM | POA: Diagnosis not present

## 2023-03-15 DIAGNOSIS — Z515 Encounter for palliative care: Secondary | ICD-10-CM | POA: Diagnosis not present

## 2023-03-15 DIAGNOSIS — T17908D Unspecified foreign body in respiratory tract, part unspecified causing other injury, subsequent encounter: Secondary | ICD-10-CM

## 2023-03-15 DIAGNOSIS — A419 Sepsis, unspecified organism: Secondary | ICD-10-CM | POA: Diagnosis not present

## 2023-03-15 LAB — PROTIME-INR
INR: 1.4 — ABNORMAL HIGH (ref 0.8–1.2)
Prothrombin Time: 17 s — ABNORMAL HIGH (ref 11.4–15.2)

## 2023-03-15 LAB — CBC
HCT: 23.2 % — ABNORMAL LOW (ref 39.0–52.0)
Hemoglobin: 7.2 g/dL — ABNORMAL LOW (ref 13.0–17.0)
MCH: 30.9 pg (ref 26.0–34.0)
MCHC: 31 g/dL (ref 30.0–36.0)
MCV: 99.6 fL (ref 80.0–100.0)
Platelets: 219 10*3/uL (ref 150–400)
RBC: 2.33 MIL/uL — ABNORMAL LOW (ref 4.22–5.81)
RDW: 16.4 % — ABNORMAL HIGH (ref 11.5–15.5)
WBC: 3.8 10*3/uL — ABNORMAL LOW (ref 4.0–10.5)
nRBC: 0 % (ref 0.0–0.2)

## 2023-03-15 LAB — BASIC METABOLIC PANEL
Anion gap: 10 (ref 5–15)
BUN: 57 mg/dL — ABNORMAL HIGH (ref 8–23)
CO2: 30 mmol/L (ref 22–32)
Calcium: 9.4 mg/dL (ref 8.9–10.3)
Chloride: 112 mmol/L — ABNORMAL HIGH (ref 98–111)
Creatinine, Ser: 1.27 mg/dL — ABNORMAL HIGH (ref 0.61–1.24)
GFR, Estimated: 53 mL/min — ABNORMAL LOW (ref >60)
Glucose, Bld: 131 mg/dL — ABNORMAL HIGH (ref 70–99)
Potassium: 4.1 mmol/L (ref 3.5–5.1)
Sodium: 152 mmol/L — ABNORMAL HIGH (ref 135–145)

## 2023-03-15 LAB — GLUCOSE, CAPILLARY
Glucose-Capillary: 106 mg/dL — ABNORMAL HIGH (ref 70–99)
Glucose-Capillary: 109 mg/dL — ABNORMAL HIGH (ref 70–99)
Glucose-Capillary: 110 mg/dL — ABNORMAL HIGH (ref 70–99)
Glucose-Capillary: 110 mg/dL — ABNORMAL HIGH (ref 70–99)
Glucose-Capillary: 111 mg/dL — ABNORMAL HIGH (ref 70–99)
Glucose-Capillary: 116 mg/dL — ABNORMAL HIGH (ref 70–99)
Glucose-Capillary: 89 mg/dL (ref 70–99)

## 2023-03-15 LAB — PHOSPHORUS: Phosphorus: 3.8 mg/dL (ref 2.5–4.6)

## 2023-03-15 LAB — HEPARIN LEVEL (UNFRACTIONATED)
Heparin Unfractionated: 0.11 [IU]/mL — ABNORMAL LOW (ref 0.30–0.70)
Heparin Unfractionated: 0.61 [IU]/mL (ref 0.30–0.70)

## 2023-03-15 LAB — MAGNESIUM: Magnesium: 2.1 mg/dL (ref 1.7–2.4)

## 2023-03-15 MED ORDER — FREE WATER
300.0000 mL | Status: DC
  Administered 2023-03-15 – 2023-03-19 (×20): 300 mL

## 2023-03-15 NOTE — Progress Notes (Signed)
Daily Progress Note   Patient Name: Stephen Flynn       Date: 03/15/2023 DOB: [birth year]  Age: 89 y.o. MRN#: [id number] Attending Physician: Dorcas Carrow, MD Primary Care Physician: Creola Corn, MD Admit Date: 03-16-23  Reason for Consultation/Follow-up: Establishing goals of care  Subjective: Medical records reviewed including progress notes, labs, imaging. Patient assessed at the bedside.  He is sleeping comfortably.  Did not attempt to arouse in order to preserve comfort.  No family present during visit.  I called patient's daughter Lanae Crumbly to attempt to coordinate family meeting for goals of care discussions.  Introduced Palliative Medicine as specialized medical care for people living with serious illness. It focuses on providing relief from the symptoms and stress of a serious illness. The goal is to improve quality of life for both the patient and the family.   I recommended a family meeting to discuss patient's care moving forward, his care preferences and goals.  She notes that her mother mentioned something about this yesterday, asking if the family could urgently go visit him as soon as possible.  I clarified that he is currently stable, but he did have a concern for aspiration and continues to face complex medical decision making with risk for further complications.    Noted that he may even require lifelong TPN and shared my concern that he may be at his new baseline quality of life without further meaningful improvement.  Emphasized the importance of goals of care discussions, exploring whether this will be acceptable to the patient for a prolonged time.  Lanae Crumbly verbalized her understanding and appreciation of the clarification.  She will reach out to her brother this evening to  coordinate his availability, as the other 2 children can be present within 30 to 40 minutes.  She notes that they have to attend a funeral for her cousin this weekend, so that might not be the best time.  I encouraged that the sooner a family meeting can occur the better.  Provided with PMT contact information.  Questions and concerns addressed. PMT will continue to support holistically.   Length of Stay: 18  Physical Exam Vitals and nursing note reviewed.  Constitutional:      General: He is not in acute distress.    Appearance: He is ill-appearing.     Interventions: Nasal  cannula in place.     Comments: 2L  Cardiovascular:     Rate and Rhythm: Normal rate.  Pulmonary:     Effort: Pulmonary effort is normal. No respiratory distress.  Psychiatric:        Cognition and Memory: Cognition is impaired.            Vital Signs: BP (!) 141/91 (BP Location: Left Leg)   Pulse 73   Temp 98 F (36.7 C) (Axillary)   Resp 19   Ht 5' 9.02" (1.753 m)   Wt 106.7 kg   SpO2 98%   BMI 34.72 kg/m  SpO2: SpO2: 98 % O2 Device: O2 Device: Nasal Cannula O2 Flow Rate: O2 Flow Rate (L/min): 2 L/min      Palliative Assessment/Data: 30% (tube feeds)    Palliative Care Assessment & Plan   Patient Profile: 89 y.o. male  with past medical history of persistent A-fib on Eliquis, diastolic CHF, CVA, BPH, DM-2, thoracic AAA and debility with wheelchair dependence, poorly controlled hypertension admitted on 02/16/2023 with acute abdominal pain.    In the ED, patient was in A-fib with RVR and CT abdomen and pelvis showed small bowel obstruction, moderate ascites and BPH.  Initially was treated with conservative management and NG tube decompression for small bowel obstruction, however on Mar 15, 2023 he required pressor support and underwent exploratory laparotomy and small bowel resection.  Echo showed EF of 40 to 45%.   Patient was extubated on 8/27 then reintubated on 9/1.  Most recently extubated on 9/6. PCCM  has discussed possible tracheostomy with patient's family. PMT has been consulted to assist with goals of care conversation on hospital day 16 after transfer to the ICU.  Assessment: Goals of care conversation Small bowel obstruction status post exploratory laparotomy and small bowel resection with primary anastomosis A-fib with RVR Acute respiratory failure with hypoxia and hypercapnia, improved Acute metabolic and septic encephalopathy  Recommendations/Plan: Continue full code/full scope treatment Patient's daughter will coordinate a preferred date/time for a family meeting with all 3 adult children and patient's wife.  She notes this may not be feasible until next week PMT will await return call for coordination of family meeting   Prognosis: Poor  Discharge Planning: To Be Determined  Care plan was discussed with patient's daughter-in-law, patient's wife   MDM high         Chenee Munns Jeni Salles, PA-C  Palliative Medicine Team Team phone # [phone]  Thank you for allowing the Palliative Medicine Team to assist in the care of this patient. Please utilize secure chat with additional questions, if there is no response within 30 minutes please call the above phone number.  Palliative Medicine Team providers are available by phone from 7am to 7pm daily and can be reached through the team cell phone.  Should this patient require assistance outside of these hours, please call the patient's attending physician.

## 2023-03-15 NOTE — Progress Notes (Signed)
ANTICOAGULATION CONSULT NOTE-Follow Up  Pharmacy Consult for Heparin Indication: atrial fibrillation  Allergies  Allergen Reactions   Penicillin G     Other reaction(s): Drowsy   Penicillins Hives    Did it involve swelling of the face/tongue/throat, SOB, or low BP? No Did it involve sudden or severe rash/hives, skin peeling, or any reaction on the inside of your mouth or nose? yes Did you need to seek medical attention at a hospital or doctor's office? yes When did it last happen?  child     If all above answers are "NO", may proceed with cephalosporin use.    Patient Measurements: Height: 5' 9.02" (175.3 cm) Weight: 106.7 kg (235 lb 3.7 oz) IBW/kg (Calculated) : 70.74 Heparin Dosing Weight: 95 kg  Vital Signs: Temp: 97.4 F (36.3 C) (09/12 1118) Temp Source: Oral (09/12 1118) BP: 109/69 (09/12 1118) Pulse Rate: 75 (09/12 1118)  Labs: Recent Labs    03/13/23 0655 03/14/23 0620 03/14/23 1759 03/15/23 0404 03/15/23 1430  HGB 7.4* 7.3*  --  7.2*  --   HCT 24.3* 24.3*  --  23.2*  --   PLT 251 245  --  219  --   LABPROT  --   --   --  17.0*  --   INR  --   --   --  1.4*  --   HEPARINUNFRC 0.35 0.20* 0.20* 0.11* 0.61  CREATININE  --  1.15  --  1.27*  --     Estimated Creatinine Clearance: 43.7 mL/min (A) (by C-G formula based on SCr of 1.27 mg/dL (H)).   Medical History: Past Medical History:  Diagnosis Date   Anemia    Aortic insufficiency    Chest pain    H/O: GI bleed    Hyperlipidemia    Hypertension    Leg cramps    Stroke [facility])     Assessment: Stephen Flynn s/p SBR on Eliqius for atrial fibrillation. Tracheostomy planned 9/9 - plan to hold Eliquis and start heparin until trach completed.  PM heparin level 0.61  Goal of Therapy:  Heparin level 0.3-0.5 units/ml aPTT 66-85 seconds Monitor platelets by anticoagulation protocol: Yes   Plan:  -Decrease heparin to 1100 units / hr -Follow up AM labs -Cortrak is moved to post-pyloric, Eliquis should not be  given due to poor absorption.Would plan for warfarin  Thank you Okey Regal, PharmD 03/15/2023 3:29 PM

## 2023-03-15 NOTE — Progress Notes (Signed)
ANTICOAGULATION CONSULT NOTE-Follow Up  Pharmacy Consult for Heparin Indication: atrial fibrillation  Allergies  Allergen Reactions   Penicillin G     Other reaction(s): Drowsy   Penicillins Hives    Did it involve swelling of the face/tongue/throat, SOB, or low BP? No Did it involve sudden or severe rash/hives, skin peeling, or any reaction on the inside of your mouth or nose? yes Did you need to seek medical attention at a hospital or doctor's office? yes When did it last happen?  child     If all above answers are "NO", may proceed with cephalosporin use.    Patient Measurements: Height: 5' 9.02" (175.3 cm) Weight: 106.7 kg (235 lb 3.7 oz) IBW/kg (Calculated) : 70.74 Heparin Dosing Weight: 95 kg  Vital Signs: Temp: 97.6 F (36.4 C) (09/12 0334) Temp Source: Oral (09/12 0334) BP: 126/86 (09/12 0334) Pulse Rate: 70 (09/12 0334)  Labs: Recent Labs    03/13/23 0655 03/14/23 0620 03/14/23 1759 03/15/23 0404  HGB 7.4* 7.3*  --  7.2*  HCT 24.3* 24.3*  --  23.2*  PLT 251 245  --  219  LABPROT  --   --   --  17.0*  INR  --   --   --  1.4*  HEPARINUNFRC 0.35 0.20* 0.20* 0.11*  CREATININE  --  1.15  --  1.27*    Estimated Creatinine Clearance: 43.7 mL/min (A) (by C-G formula based on SCr of 1.27 mg/dL (H)).   Medical History: Past Medical History:  Diagnosis Date   Anemia    Aortic insufficiency    Chest pain    H/O: GI bleed    Hyperlipidemia    Hypertension    Leg cramps    Stroke [facility])     Assessment: 89 YOM s/p SBR on Eliqius for atrial fibrillation. Tracheostomy planned 9/9 - plan to hold Eliquis and start heparin until trach completed.  -Heparin level subtherapeutic this AM at 0.20 on 850 units/hr; hg= 7.4; no infusion interruptions noted per RN  9/13 AM: heparin level returned at 0.11 on 1000 units/hr (subtherapeutic). Per RN, no issues with the heparin infusion running continuously or signs/symptoms of bleeding. Hgb slow trend down to 7.2, plts  trending down. Cortrak has been moved to post-pyloric position  Goal of Therapy:  Heparin level 0.3-0.5 units/ml aPTT 66-85 seconds Monitor platelets by anticoagulation protocol: Yes   Plan:  -Increase heparin to 1150 units.hr -Heparin level in 8 hrs -Cortrak is moved to post-pyloric, Eliquis should not be given due to poor absorption.Would plan for warfarin  Arabella Merles, PharmD. Clinical Pharmacist 03/15/2023 5:32 AM

## 2023-03-15 NOTE — Evaluation (Signed)
Clinical/Bedside Swallow Evaluation Patient Details  Name: Mann Solow MRN: [id number] Date of Birth: [birth year]  Today's Date: 03/15/2023 Time: SLP Start Time (ACUTE ONLY): 1135 SLP Stop Time (ACUTE ONLY): 1150 SLP Time Calculation (min) (ACUTE ONLY): 15 min  Past Medical History:  Past Medical History:  Diagnosis Date   Anemia    Aortic insufficiency    Chest pain    H/O: GI bleed    Hyperlipidemia    Hypertension    Leg cramps    Stroke [facility])    Past Surgical History:  Past Surgical History:  Procedure Laterality Date   BOWEL RESECTION N/A 02/03/2023   Procedure: SMALL BOWEL RESECTION;  Surgeon: Harriette Bouillon, MD;  Location: [facility] OR;  Service: General;  Laterality: N/A;   CARDIOVASCULAR STRESS TEST  09/04/2005   LAPAROTOMY N/A 03/02/2023   Procedure: EXPLORATORY LAPAROTOMY;  Surgeon: Harriette Bouillon, MD;  Location: [facility] OR;  Service: General;  Laterality: N/A;   US ECHOCARDIOGRAPHY  11/26/2007   ef 55-60%   HPI:  89 y/o male admitted 8/25 with abdominal pain, SOB with bowel obstruction. 8/26 developed hypotension and required ex-lap with small bowel resection, remained intubated post op. Extubated 8/27. Reintubated 9/1-9/6. 9/9 with R head turn and decr cognition noted from therapist but MRI negative. PMhx: GIB, stroke, HTN, Afib, neuropathy, CHF, obesity    Assessment / Plan / Recommendation  Clinical Impression  Patient alert and cooperative, requiring moderate cueing due to confusion, moderate-severe dysarthria present. He present with evidence of dysphagia with aspiration characterized by cough before swallow likely due to delayed swallow initiation with thin liquids. Pureed solids consumed without overt evidence of aspiration however given length of time intubated across multiple intubations, and decreased cognitive status, recommend instrumental testing to determine least restrictive diet. Will plan for MBS next date. SLP Visit Diagnosis: Dysphagia, unspecified (R13.10)     Aspiration Risk       Diet Recommendation NPO    Medication Administration: Via alternative means    Other  Recommendations Oral Care Recommendations: Oral care QID    Recommendations for follow up therapy are one component of a multi-disciplinary discharge planning process, led by the attending physician.  Recommendations may be updated based on patient status, additional functional criteria and insurance authorization.  Follow up Recommendations  (TBD)         Functional Status Assessment Patient has had a recent decline in their functional status and demonstrates the ability to make significant improvements in function in a reasonable and predictable amount of time.    Swallow Study   General HPI: 89 y/o male admitted 8/25 with abdominal pain, SOB with bowel obstruction. 8/26 developed hypotension and required ex-lap with small bowel resection, remained intubated post op. Extubated 8/27. Reintubated 9/1-9/6. 9/9 with R head turn and decr cognition noted from therapist but MRI negative. PMhx: GIB, stroke, HTN, Afib, neuropathy, CHF, obesity Type of Study: Bedside Swallow Evaluation Previous Swallow Assessment: BSE in 2020 with normal swallow Diet Prior to this Study: NPO;Cortrak/Small bore NG tube Temperature Spikes Noted: No Respiratory Status: Nasal cannula History of Recent Intubation: Yes Total duration of intubation (days): 6 days (8/26-8/27, 9/1-9/6) Date extubated: 03/09/23 Behavior/Cognition: Alert;Cooperative;Pleasant mood;Confused;Requires cueing Oral Cavity Assessment: Within Functional Limits Oral Care Completed by SLP: Yes Oral Cavity - Dentition: Missing dentition Vision: Functional for self-feeding Self-Feeding Abilities: Needs assist Patient Positioning: Upright in bed Baseline Vocal Quality: Wet (dysarthric) Volitional Cough: Congested Volitional Swallow: Able to elicit    Oral/Motor/Sensory Function Overall Oral Motor/Sensory Function:  (  appears [facility]  although difficulty for patient to consistently follow commands)   Ice Chips Ice chips: Within functional limits Presentation: Spoon   Thin Liquid Thin Liquid: Impaired Presentation: Cup Oral Phase Impairments: Reduced labial seal Oral Phase Functional Implications: Right anterior spillage Pharyngeal  Phase Impairments: Multiple swallows;Suspected delayed Swallow;Throat Clearing - Immediate;Cough - Immediate;Cough - Delayed;Wet Vocal Quality    Nectar Thick Nectar Thick Liquid: Not tested   Honey Thick Honey Thick Liquid: Not tested   Puree Puree: Within functional limits   Solid     Solid: Not tested     Ferdinand Lango MA, CCC-SLP  Tanicka Bisaillon Meryl 03/15/2023,11:56 AM

## 2023-03-15 NOTE — Progress Notes (Signed)
17 Days Post-Op   Subjective/Chief Complaint: Alert. Oriented to person and hospital. Still with speech that is difficult to understand.  Tolerating TF.    Objective: Vital signs in last 24 hours: Temp:  [97.2 F (36.2 C)-98.1 F (36.7 C)] 98 F (36.7 C) (09/12 0758) Pulse Rate:  [70-80] 73 (09/12 0758) Resp:  [13-20] 19 (09/12 0758) BP: (116-141)/(74-96) 141/91 (09/12 0758) SpO2:  [96 %-100 %] 98 % (09/12 0758) FiO2 (%):  [28 %] 28 % (09/12 0749) Weight:  [106.7 kg] 106.7 kg (09/12 0500) Last BM Date : 03/14/23  Intake/Output from previous day: 09/11 0701 - 09/12 0700 In: 2870.4 [I.V.:772.9; NG/GT:2097.5] Out: 1250 [Urine:1200; Drains:50] Intake/Output this shift: No intake/output data recorded. Alert, NAD Cortrak in place, tube feeds running at 55 mL /hr Abdomen soft and nondistended. Wound vac in place on midline wound, serosanguinous fluid in canister.  Neuro: oriented to person and hospital, [facility]   Lab Results:  Recent Labs    03/14/23 0620 03/15/23 0404  WBC 4.6 3.8*  HGB 7.3* 7.2*  HCT 24.3* 23.2*  PLT 245 219   BMET Recent Labs    03/14/23 0620 03/15/23 0404  NA 148* 152*  K 4.1 4.1  CL 113* 112*  CO2 30 30  GLUCOSE 120* 131*  BUN 60* 57*  CREATININE 1.15 1.27*  CALCIUM 9.1 9.4   PT/INR Recent Labs    03/15/23 0404  LABPROT 17.0*  INR 1.4*   ABG No results for input(s): "PHART", "HCO3" in the last 72 hours.  Invalid input(s): "PCO2", "PO2"  Studies/Results: DG Abd Portable 1V  Result Date: 03/14/2023 CLINICAL DATA:  [id number] Encounter for feeding tube placement [id number] EXAM: PORTABLE ABDOMEN - 1 VIEW COMPARISON:  X-ray abdomen 03/12/2023 FINDINGS: Two consecutive radiographs of the abdomen with the most recent radiograph (10:47 a.m.) demonstrating enteric tube overlying the expected region of the third portion of the duodenum. (Interval retraction of the enteric tube from the x-ray abdomen 03/14/2023 10:07 a.m.). The bowel gas pattern is  normal. No radio-opaque calculi or other significant radiographic abnormality are seen. IMPRESSION: 1. Two consecutive radiographs of the abdomen with the most recent radiograph (10:47 a.m.) demonstrating enteric tube overlying the expected region of the third portion of the duodenum. 2. Nonobstructive bowel gas pattern. Electronically Signed   By: Tish Frederickson M.D.   On: 03/14/2023 11:05   DG Abd Portable 1V  Result Date: 03/14/2023 CLINICAL DATA:  [id number] Encounter for feeding tube placement [id number] EXAM: PORTABLE ABDOMEN - 1 VIEW COMPARISON:  X-ray abdomen 03/12/2023 FINDINGS: Two consecutive radiographs of the abdomen with the most recent radiograph (10:47 a.m.) demonstrating enteric tube overlying the expected region of the third portion of the duodenum. (Interval retraction of the enteric tube from the x-ray abdomen 03/14/2023 10:07 a.m.). The bowel gas pattern is normal. No radio-opaque calculi or other significant radiographic abnormality are seen. IMPRESSION: 1. Two consecutive radiographs of the abdomen with the most recent radiograph (10:47 a.m.) demonstrating enteric tube overlying the expected region of the third portion of the duodenum. 2. Nonobstructive bowel gas pattern. Electronically Signed   By: Tish Frederickson M.D.   On: 03/14/2023 11:05   DG CHEST PORT 1 VIEW  Result Date: 03/14/2023 CLINICAL DATA:  Acute respiratory distress ,cough,congestion EXAM: PORTABLE CHEST 1 VIEW COMPARISON:  Chest x-ray 03/08/2023, CT chest plain 05/16/2019 FINDINGS: Right PICC tip overlying the expected region the superior vena cava. Enteric tube courses below the hemidiaphragm with tip collimated off view. Interval removal of an endotracheal  tube. Stable enlarged ascending thoracic aorta and aortic knob. Persistently enlarged cardiac silhouette. The heart and mediastinal contours are unchanged. Aortic calcification. Interval development of prominent hilar vasculature. Left hilar airspace opacity suggestive of  prominent hilar vasculature versus developing consolidation. Increased interstitial markings. No pleural effusion. No pneumothorax. No acute osseous abnormality. IMPRESSION: 1. Interval removal of an endotracheal tube. 2. Persistently enlarged cardiac silhouette-underlying pericardial effusion not excluded. 3. Stable enlarged ascending thoracic aorta in aortic knob-limited radiographic evaluation of known aneurysmal ascending thoracic aorta. 4. Pulmonary edema. Superimposed infection/inflammation not excluded. Left hilar airspace opacity suggestive of prominent hilar vasculature versus developing consolidation. Electronically Signed   By: Tish Frederickson M.D.   On: 03/14/2023 06:17    Anti-infectives: Anti-infectives (From admission, onward)    Start     Dose/Rate Route Frequency Ordered Stop   03/14/23 0700  cefTRIAXone (ROCEPHIN) 1 g in sodium chloride 0.9 % 100 mL IVPB  Status:  Discontinued        1 g 200 mL/hr over 30 Minutes Intravenous Daily 03/14/23 0621 03/14/23 0759   03/14/23 0630  azithromycin (ZITHROMAX) 500 mg in sodium chloride 0.9 % 250 mL IVPB  Status:  Discontinued        500 mg 250 mL/hr over 60 Minutes Intravenous Daily 03/14/23 0621 03/14/23 0759   03/04/23 1000  meropenem (MERREM) 1 g in sodium chloride 0.9 % 100 mL IVPB        1 g 200 mL/hr over 30 Minutes Intravenous Every 12 hours 03/04/23 0855 03/10/23 2132   03/01/23 1500  ceFEPIme (MAXIPIME) 2 g in sodium chloride 0.9 % 100 mL IVPB        2 g 200 mL/hr over 30 Minutes Intravenous Every 12 hours 03/01/23 1124 03/02/23 0356   03/01/23 0300  ceFEPIme (MAXIPIME) 2 g in sodium chloride 0.9 % 100 mL IVPB  Status:  Discontinued        2 g 200 mL/hr over 30 Minutes Intravenous Every 24 hours 02/28/23 1141 03/01/23 1124   2023-03-06 1445  piperacillin-tazobactam (ZOSYN) IVPB 3.375 g  Status:  Discontinued        3.375 g 100 mL/hr over 30 Minutes Intravenous Every 8 hours March 06, 2023 1436 2023/03/06 1445   03/06/2023 1445  ceFEPIme  (MAXIPIME) 2 g in sodium chloride 0.9 % 100 mL IVPB  Status:  Discontinued        2 g 200 mL/hr over 30 Minutes Intravenous Every 12 hours Mar 06, 2023 1446 02/28/23 1141   March 06, 2023 1445  metroNIDAZOLE (FLAGYL) IVPB 500 mg        500 mg 100 mL/hr over 60 Minutes Intravenous Every 12 hours 2023-03-06 1446 03/02/23 1719   March 06, 2023 1015  meropenem (MERREM) 1 g in sodium chloride 0.9 % 100 mL IVPB        1 g 200 mL/hr over 30 Minutes Intravenous  Once 06-Mar-2023 0917 03/06/23 1743       Assessment/Plan: -POD#17 s/p Exploratory laparotomy small bowel resection and primary anastomosis 2023-03-06 Dr. Luisa Hart - approximately 120 cm of small bowel remaining, if patient has a lot of diarrhea may need imodium. - secretions better, concern for aspiration, no PO intake until cleared by SLP. Will order SLP swallow eval. Continue post-pyloric TF.  - abdominal wound vac MWF   Francine Graven Tamiah Dysart PA-C 03/15/2023

## 2023-03-15 NOTE — Progress Notes (Signed)
PROGRESS NOTE    Stephen Flynn  YNW:[id number] DOB: [birth year] DOA: 02/20/2023 PCP: Creola Corn, MD    Brief Narrative:  89 year old male with history of persistent atrial fibrillation on Eliquis, wheelchair-bound, chronic diastolic heart failure, hypertension, unspecified CVA, BPH, diabetes mellitus type 2, thoracic AAA presented with worsening abdominal pain and no bowel movement 3 days prior to presentation.  On presentation, he was in A-fib with RVR requiring Cardizem and heparin drips.  CT of abdomen and pelvis showed small bowel obstruction and moderate ascites.  General surgery initially recommended conservative management with NG tube decompression; he was initially admitted to [facility] service.  Subsequently, he became confused/nonverbal with hypotension and desaturation with increased abdominal tenderness.  He was transferred to ICU.  He underwent exploratory laparotomy and small bowel resection on 03/20/23; returned back to ICU intubated.  He was extubated on 02/27/2023.  He was reintubated on 03/04/2023.  He had bronchoscopy with BAL on 03/04/2023.  Received 1 unit packed red cells on 03/08/2023 for hemoglobin of 6.6.  He was extubated again on 03/09/2023. He was on TPN which has already been discontinued.  He is currently on cortrak feedings.  He was transferred to [facility] service from 03/13/2023 onwards. Patient remains in the hospital.  Recurrent aspiration.  Unable to eat.  Persistently lethargic and difficult to recover.   Assessment & Plan:   Small bowel obstruction status post expiratory laparotomy and small bowel resection -Status post expiratory laparotomy with bowel resection in 2023/03/20.  Wound VAC and postop management per general surgery. -Currently on tube feeds postpyloric.   Acute respiratory failure with hypoxia and hypercapnia Septic shock: Present on admission, resolved Aspiration pneumonia -Concerns of fluid overload and aspiration.  Patient has been now been extubated since 9/6.   Bronchodilators.  I-S/flutter valve(unable to participate on chest physiotherapy)  Completed antibiotics meropenem (9/1 - 9/7) 9/11, possible aspiration again overnight, procalcitonin less than 0.1.  Holding off on antibiotics.  Acute metabolic and septic encephalopathy -Waxing and waning mental status.  Still slow to respond.  Monitor. -Follow-up precautions.   Persistent A-fib with RVR -Cardiology team following.  Continue heparin, plans for Xarelto when able to take p.o. unsure when he can eat.  Rate controlled on amiodarone.   Acute systolic heart failure in a patient with history of chronic diastolic heart failure, EF 45% Echo shows EF of 45%.  Plans for GDMT.  Unable to tolerate.   AKI Resolved   Hypernatremia Sodium continues to go up.  Increase free water flushes to 300 mL every 4 hours.    Diabetes mellitus type 2 -A1c 6.5.  Continue CBGs with SSI.   Anemia of chronic disease Anemia due to critical illness -Required 1 unit packed red cell transfusion on 03/08/2023 for hemoglobin of 6.6.  Hemoglobin 7.3 today.  Transfuse if hemoglobin is less than 7 or if there is any active bleeding.   Generalized deconditioning Baseline debility: Wheelchair-bound Goals of care -Poor chances of recovery and meaningful rehab.  Palliative care following.  Currently remains full code.  Unsure patient will be able to eat soon. -Patient is appropriate for palliation and hospice if family agrees.   Moderate malnutrition -Follow dietitian/nutrition recommendations.  TPN discontinued on 03/09/2023. -Currently on cortrak feeds     DVT prophylaxis: Heparin drip Code Status: Full Family Communication: None at bedside Disposition Plan: Status is: Inpatient Remains inpatient appropriate because: Of severity of illness     Subjective:  Patient seen and examined.  No overnight events.  He is  trying to answer few questions but difficult to understand.  He does follow simple  commands.   Examination:  General: Sick looking.  Frail and Extremely debilitated. Cardiovascular: S1-S2 normal.  Regular rate rhythm. Respiratory: Bilateral conducted upper airway sounds.  Poor inspiratory efforts. Gastrointestinal: Soft.  Midline surgical incision with wound VAC.  Mildly tender along the incision site. Ext: No swelling. Neuro: Alert and awake.  He is oriented to his name. Musculoskeletal: Debilitated.  Poor muscle tone.       Objective: Vitals:   03/15/23 0500 03/15/23 0749 03/15/23 0758 03/15/23 1118  BP:   (!) 141/91 109/69  Pulse:  76 73 75  Resp:  18 19 19   Temp:   98 F (36.7 C) (!) 97.4 F (36.3 C)  TempSrc:   Axillary Oral  SpO2:  99% 98% 98%  Weight: 106.7 kg     Height:        Intake/Output Summary (Last 24 hours) at 03/15/2023 1200 Last data filed at 03/15/2023 0640 Gross per 24 hour  Intake 2870.4 ml  Output 450 ml  Net 2420.4 ml   Filed Weights   03/13/23 0412 03/14/23 0451 03/15/23 0500  Weight: 109.6 kg 110.5 kg 106.7 kg    Scheduled Meds:  acetaminophen  1,000 mg Per Tube TID   amiodarone  200 mg Per Tube BID   Chlorhexidine Gluconate Cloth  6 each Topical Daily   docusate  100 mg Per Tube BID   feeding supplement (PROSource TF20)  60 mL Per Tube Daily   free water  300 mL Per Tube Q4H   ipratropium  0.5 mg Nebulization BID   levalbuterol  1.25 mg Nebulization BID   nutrition supplement (JUVEN)  1 packet Per Tube BID BM   mouth rinse  15 mL Mouth Rinse Q2H   polyethylene glycol  17 g Per Tube Daily   scopolamine  1 patch Transdermal Q72H   sodium chloride flush  10-40 mL Intracatheter Q12H   Continuous Infusions:  sodium chloride Stopped (03/14/23 2139)   sodium chloride Stopped (03/14/23 2140)   feeding supplement (OSMOLITE 1.5 CAL) 50 mL/hr at 03/15/23 0640   heparin 1,150 Units/hr (03/15/23 0640)    Nutritional status Signs/Symptoms: mild fat depletion, severe muscle depletion Interventions: Refer to RD note for  recommendations Body mass index is 34.72 kg/m.  Data Reviewed:   CBC: Recent Labs  Lab 03/11/23 0200 03/12/23 0430 03/13/23 0655 03/14/23 0620 03/15/23 0404  WBC 5.8 4.5 4.3 4.6 3.8*  HGB 7.2* 7.4* 7.4* 7.3* 7.2*  HCT 22.8* 24.0* 24.3* 24.3* 23.2*  MCV 96.6 99.2 97.6 99.2 99.6  PLT 238 240 251 245 219   Basic Metabolic Panel: Recent Labs  Lab 03/09/23 0419 03/10/23 0205 03/11/23 0200 03/12/23 0430 03/14/23 0620 03/15/23 0404  NA 143 145 147* 150* 148* 152*  K 4.4 4.7 4.6 4.3 4.1 4.1  CL 110 111 116* 116* 113* 112*  CO2 27 25 25 28 30 30   GLUCOSE 140* 109* 125* 87 120* 131*  BUN 87* 86* 82* 70* 60* 57*  CREATININE 1.15 1.14 1.14 1.06 1.15 1.27*  CALCIUM 8.7* 8.8* 8.9 9.2 9.1 9.4  MG 2.4 2.4  --   --  2.1 2.1  PHOS 3.7 4.1  --   --   --  3.8   GFR: Estimated Creatinine Clearance: 43.7 mL/min (A) (by C-G formula based on SCr of 1.27 mg/dL (H)). Liver Function Tests: Recent Labs  Lab 03/14/23 0620  AST 20  ALT 21  ALKPHOS 57  BILITOT 0.5  PROT 6.4*  ALBUMIN 2.4*   No results for input(s): "LIPASE", "AMYLASE" in the last 168 hours. No results for input(s): "AMMONIA" in the last 168 hours. Coagulation Profile: Recent Labs  Lab 03/15/23 0404  INR 1.4*   Cardiac Enzymes: No results for input(s): "CKTOTAL", "CKMB", "CKMBINDEX", "TROPONINI" in the last 168 hours. BNP (last 3 results) No results for input(s): "PROBNP" in the last 8760 hours. HbA1C: No results for input(s): "HGBA1C" in the last 72 hours. CBG: Recent Labs  Lab 03/14/23 1958 03/15/23 0010 03/15/23 0337 03/15/23 0800 03/15/23 1117  GLUCAP 95 109* 116* 110* 111*   Lipid Profile: No results for input(s): "CHOL", "HDL", "LDLCALC", "TRIG", "CHOLHDL", "LDLDIRECT" in the last 72 hours. Thyroid Function Tests: No results for input(s): "TSH", "T4TOTAL", "FREET4", "T3FREE", "THYROIDAB" in the last 72 hours. Anemia Panel: No results for input(s): "VITAMINB12", "FOLATE", "FERRITIN", "TIBC",  "IRON", "RETICCTPCT" in the last 72 hours. Sepsis Labs: Recent Labs  Lab 03/14/23 0620  PROCALCITON <0.10    Recent Results (from the past 240 hour(s))  Culture, blood (Routine X 2) w Reflex to ID Panel     Status: None (Preliminary result)   Collection Time: 03/14/23  9:40 AM   Specimen: BLOOD LEFT HAND  Result Value Ref Range Status   Specimen Description BLOOD LEFT HAND  Final   Special Requests   Final    BOTTLES DRAWN AEROBIC AND ANAEROBIC Blood Culture results may not be optimal due to an inadequate volume of blood received in culture bottles   Culture   Final    NO GROWTH < 24 HOURS Performed at [facility], [address] [place], [place] [address]    Report Status PENDING  Incomplete  Culture, blood (Routine X 2) w Reflex to ID Panel     Status: None (Preliminary result)   Collection Time: 03/14/23  9:40 AM   Specimen: BLOOD LEFT HAND  Result Value Ref Range Status   Specimen Description BLOOD LEFT HAND  Final   Special Requests   Final    AEROBIC BOTTLE ONLY Blood Culture results may not be optimal due to an inadequate volume of blood received in culture bottles   Culture   Final    NO GROWTH < 24 HOURS Performed at [facility], [address] [place], [place] [address]    Report Status PENDING  Incomplete         Radiology Studies: DG Abd Portable 1V  Result Date: 03/14/2023 CLINICAL DATA:  [id number] Encounter for feeding tube placement [id number] EXAM: PORTABLE ABDOMEN - 1 VIEW COMPARISON:  X-ray abdomen 03/12/2023 FINDINGS: Two consecutive radiographs of the abdomen with the most recent radiograph (10:47 a.m.) demonstrating enteric tube overlying the expected region of the third portion of the duodenum. (Interval retraction of the enteric tube from the x-ray abdomen 03/14/2023 10:07 a.m.). The bowel gas pattern is normal. No radio-opaque calculi or other significant radiographic abnormality are seen. IMPRESSION: 1. Two consecutive radiographs of the  abdomen with the most recent radiograph (10:47 a.m.) demonstrating enteric tube overlying the expected region of the third portion of the duodenum. 2. Nonobstructive bowel gas pattern. Electronically Signed   By: Tish Frederickson M.D.   On: 03/14/2023 11:05   DG Abd Portable 1V  Result Date: 03/14/2023 CLINICAL DATA:  [id number] Encounter for feeding tube placement [id number] EXAM: PORTABLE ABDOMEN - 1 VIEW COMPARISON:  X-ray abdomen 03/12/2023 FINDINGS: Two consecutive radiographs of the abdomen with the most  recent radiograph (10:47 a.m.) demonstrating enteric tube overlying the expected region of the third portion of the duodenum. (Interval retraction of the enteric tube from the x-ray abdomen 03/14/2023 10:07 a.m.). The bowel gas pattern is normal. No radio-opaque calculi or other significant radiographic abnormality are seen. IMPRESSION: 1. Two consecutive radiographs of the abdomen with the most recent radiograph (10:47 a.m.) demonstrating enteric tube overlying the expected region of the third portion of the duodenum. 2. Nonobstructive bowel gas pattern. Electronically Signed   By: Tish Frederickson M.D.   On: 03/14/2023 11:05   DG CHEST PORT 1 VIEW  Result Date: 03/14/2023 CLINICAL DATA:  Acute respiratory distress ,cough,congestion EXAM: PORTABLE CHEST 1 VIEW COMPARISON:  Chest x-ray 03/08/2023, CT chest plain 05/16/2019 FINDINGS: Right PICC tip overlying the expected region the superior vena cava. Enteric tube courses below the hemidiaphragm with tip collimated off view. Interval removal of an endotracheal tube. Stable enlarged ascending thoracic aorta and aortic knob. Persistently enlarged cardiac silhouette. The heart and mediastinal contours are unchanged. Aortic calcification. Interval development of prominent hilar vasculature. Left hilar airspace opacity suggestive of prominent hilar vasculature versus developing consolidation. Increased interstitial markings. No pleural effusion. No pneumothorax. No  acute osseous abnormality. IMPRESSION: 1. Interval removal of an endotracheal tube. 2. Persistently enlarged cardiac silhouette-underlying pericardial effusion not excluded. 3. Stable enlarged ascending thoracic aorta in aortic knob-limited radiographic evaluation of known aneurysmal ascending thoracic aorta. 4. Pulmonary edema. Superimposed infection/inflammation not excluded. Left hilar airspace opacity suggestive of prominent hilar vasculature versus developing consolidation. Electronically Signed   By: Tish Frederickson M.D.   On: 03/14/2023 06:17           LOS: 18 days     Dorcas Carrow, MD [facility]  If 7PM-7AM, please contact night-coverage  03/15/2023, 12:00 PM

## 2023-03-15 NOTE — Progress Notes (Signed)
Physical Therapy Treatment Patient Details Name: Stephen Flynn MRN: [id number] DOB: 12-16-1927 Today's Date: 03/15/2023   History of Present Illness 89 y/o male admitted 8/25 with abdominal pain, SOB with bowel obstruction. 8/26 developed hypotension and required ex-lap with small bowel resection, remained intubated post op. Extubated 8/27. Reintubated 9/1-9/6. 9/9 with R head turn and decr cognition noted from therapist but MRI negative. PMhx: GIB, stroke, HTN, Afib, neuropathy, CHF, obesity    PT Comments  Received pt semi-reclined in bed asleep. Pt alert, but difficult to understanding mumbling with low tone of voice. Pt performed bed mobility with total A and use of bed features with max multimodal cues (verbal, visual, and tactile) for sequencing. Pt able to inconsistently follow one step commands while in supine, but when attempting to sit EOB was unable. Pt required x 3 attempts to sit EOB due to increased R knee pain and once sitting EOB required total A for static sitting balance and unable to follow cues to get feet on floor and began pushing posteriorly - returned to supine for safety and pt able to scoot himself to [facility] pulling on bedrail. Pt will benefit from continued rehab to address cognitive, strength, balance, and coordination deficits.    If plan is discharge home, recommend the following: Two people to help with walking and/or transfers;Two people to help with bathing/dressing/bathroom;Assist for transportation;Assistance with cooking/housework;Help with stairs or ramp for entrance;Supervision due to cognitive status   Can travel by private vehicle     No  Equipment Recommendations  Other (comment) (TBD)    Recommendations for Other Services Rehab consult;Other (comment) (rehab vs SNF depending on progress)     Precautions / Restrictions Precautions Precautions: Fall;Other (comment) Precaution Comments: cortrak, wound vac, mittens Restrictions Weight Bearing Restrictions:  No     Mobility  Bed Mobility Overal bed mobility: Needs Assistance Bed Mobility: Rolling, Supine to Sit, Sit to Supine Rolling: Total assist, Used rails   Supine to sit: Total assist, HOB elevated, Used rails Sit to supine: Total assist   General bed mobility comments: pt required x 3 trials to sit EOB due to pain in R knee. Required max multimodal cues for sequencing when rolling and hand over hand assist to grab onto bedrail. Once sitting, pt required total A for static sitting balance due to posterior lean. Unable to follow cues to scoot to EOB to get feet on floor and began pushing posteriorly - returned to supine due to safety concerns.    Transfers                   General transfer comment: unable to transfer OOB    Ambulation/Gait               General Gait Details: unable   Stairs             Wheelchair Mobility     Tilt Bed    Modified Rankin (Stroke Patients Only)       Balance Overall balance assessment: Needs assistance Sitting-balance support: Feet unsupported, Bilateral upper extremity supported Sitting balance-Leahy Scale: Zero Sitting balance - Comments: pt required total A for static sitting balance due to heavy posterior lean. Unable to follow cues for sequencing to sit safely on EOB; therefore returned to supine. Postural control: Posterior lean                                  Cognition Arousal:  Alert Behavior During Therapy: Flat affect Overall Cognitive Status: Difficult to assess Area of Impairment: Attention, Following commands, Safety/judgement, Problem solving                       Following Commands: Follows one step commands with increased time, Follows one step commands inconsistently       General Comments: Pt easily woken and alert. Pt able to follow simple one step commands inconsistantly while in supine, upon sitting EOB pt unable to follow simple one step commands, getting  distracted, and with poor attention.        Exercises      General Comments        Pertinent Vitals/Pain Pain Assessment Pain Assessment: Faces Faces Pain Scale: Hurts even more Pain Location: RLE with movement Pain Descriptors / Indicators: Discomfort, Sore, Grimacing, Moaning Pain Intervention(s): Limited activity within patient's tolerance, Monitored during session, Repositioned    Home Living                          Prior Function            PT Goals (current goals can now be found in the care plan section) Acute Rehab PT Goals Patient Stated Goal: home PT Goal Formulation: With patient/family Time For Goal Achievement: 03/22/23 Potential to Achieve Goals: Fair    Frequency    Min 1X/week      PT Plan      Co-evaluation              AM-PAC PT "6 Clicks" Mobility   Outcome Measure  Help needed turning from your back to your side while in a flat bed without using bedrails?: Total Help needed moving from lying on your back to sitting on the side of a flat bed without using bedrails?: Total Help needed moving to and from a bed to a chair (including a wheelchair)?: Total Help needed standing up from a chair using your arms (e.g., wheelchair or bedside chair)?: Total Help needed to walk in hospital room?: Total Help needed climbing 3-5 steps with a railing? : Total 6 Click Score: 6    End of Session Equipment Utilized During Treatment: Oxygen Activity Tolerance: Patient limited by fatigue;Other (comment);Patient limited by pain (impaired cognition) Patient left: in bed;with call bell/phone within reach;with bed alarm set Nurse Communication: Mobility status PT Visit Diagnosis: Other abnormalities of gait and mobility (R26.89);Pain;Muscle weakness (generalized) (M62.81) Pain - Right/Left: Right Pain - part of body: Leg     Time: 7616-0737 PT Time Calculation (min) (ACUTE ONLY): 15 min  Charges:    $Therapeutic Activity: 8-22  mins PT General Charges $$ ACUTE PT VISIT: 1 Visit                     Blima Rich PT, DPT [employer] [employer]ger 03/15/2023, 1:13 PM

## 2023-03-16 ENCOUNTER — Inpatient Hospital Stay (HOSPITAL_COMMUNITY): Payer: 59

## 2023-03-16 DIAGNOSIS — I482 Chronic atrial fibrillation, unspecified: Secondary | ICD-10-CM | POA: Diagnosis not present

## 2023-03-16 DIAGNOSIS — K56609 Unspecified intestinal obstruction, unspecified as to partial versus complete obstruction: Secondary | ICD-10-CM | POA: Diagnosis not present

## 2023-03-16 LAB — BASIC METABOLIC PANEL
Anion gap: 5 (ref 5–15)
BUN: 52 mg/dL — ABNORMAL HIGH (ref 8–23)
CO2: 29 mmol/L (ref 22–32)
Calcium: 8.9 mg/dL (ref 8.9–10.3)
Chloride: 115 mmol/L — ABNORMAL HIGH (ref 98–111)
Creatinine, Ser: 1.17 mg/dL (ref 0.61–1.24)
GFR, Estimated: 58 mL/min — ABNORMAL LOW (ref >60)
Glucose, Bld: 133 mg/dL — ABNORMAL HIGH (ref 70–99)
Potassium: 3.8 mmol/L (ref 3.5–5.1)
Sodium: 149 mmol/L — ABNORMAL HIGH (ref 135–145)

## 2023-03-16 LAB — GLUCOSE, CAPILLARY
Glucose-Capillary: 115 mg/dL — ABNORMAL HIGH (ref 70–99)
Glucose-Capillary: 117 mg/dL — ABNORMAL HIGH (ref 70–99)
Glucose-Capillary: 126 mg/dL — ABNORMAL HIGH (ref 70–99)
Glucose-Capillary: 113 mg/dL — ABNORMAL HIGH (ref 70–99)
Glucose-Capillary: 102 mg/dL — ABNORMAL HIGH (ref 70–99)

## 2023-03-16 LAB — CBC
HCT: 22.9 % — ABNORMAL LOW (ref 39.0–52.0)
Hemoglobin: 7 g/dL — ABNORMAL LOW (ref 13.0–17.0)
MCH: 30.7 pg (ref 26.0–34.0)
MCHC: 30.6 g/dL (ref 30.0–36.0)
MCV: 100.4 fL — ABNORMAL HIGH (ref 80.0–100.0)
Platelets: 202 10*3/uL (ref 150–400)
RBC: 2.28 MIL/uL — ABNORMAL LOW (ref 4.22–5.81)
RDW: 16.5 % — ABNORMAL HIGH (ref 11.5–15.5)
WBC: 3.3 10*3/uL — ABNORMAL LOW (ref 4.0–10.5)
nRBC: 0 % (ref 0.0–0.2)

## 2023-03-16 LAB — PREPARE RBC (CROSSMATCH)

## 2023-03-16 LAB — HEMOGLOBIN AND HEMATOCRIT, BLOOD
HCT: 26.1 % — ABNORMAL LOW (ref 39.0–52.0)
Hemoglobin: 7.9 g/dL — ABNORMAL LOW (ref 13.0–17.0)

## 2023-03-16 LAB — HEPARIN LEVEL (UNFRACTIONATED)
Heparin Unfractionated: 0.14 [IU]/mL — ABNORMAL LOW (ref 0.30–0.70)
Heparin Unfractionated: 0.19 [IU]/mL — ABNORMAL LOW (ref 0.30–0.70)

## 2023-03-16 LAB — MAGNESIUM: Magnesium: 2.1 mg/dL (ref 1.7–2.4)

## 2023-03-16 MED ORDER — ACETAMINOPHEN 500 MG PO TABS
1000.0000 mg | ORAL_TABLET | Freq: Three times a day (TID) | ORAL | Status: DC
  Administered 2023-03-16 – 2023-03-18 (×6): 1000 mg via ORAL
  Filled 2023-03-16 (×6): qty 2

## 2023-03-16 MED ORDER — SODIUM CHLORIDE 0.9% IV SOLUTION: Freq: Once | INTRAVENOUS | Status: AC

## 2023-03-16 MED ORDER — AMIODARONE HCL 200 MG PO TABS
200.0000 mg | ORAL_TABLET | Freq: Two times a day (BID) | ORAL | Status: DC
  Administered 2023-03-16 – 2023-03-18 (×4): 200 mg via ORAL
  Filled 2023-03-16 (×4): qty 1

## 2023-03-16 MED ORDER — LOPERAMIDE HCL 1 MG/7.5ML PO SUSP
2.0000 mg | Freq: Two times a day (BID) | ORAL | Status: DC
  Filled 2023-03-16: qty 15

## 2023-03-16 MED ORDER — LOPERAMIDE HCL 1 MG/7.5ML PO SUSP
2.0000 mg | Freq: Two times a day (BID) | ORAL | Status: DC
  Administered 2023-03-16 – 2023-03-18 (×4): 2 mg via ORAL
  Filled 2023-03-16 (×5): qty 15

## 2023-03-16 MED ORDER — BANATROL TF EN LIQD
60.0000 mL | Freq: Two times a day (BID) | ENTERAL | Status: DC
  Administered 2023-03-16 – 2023-03-21 (×11): 60 mL
  Filled 2023-03-16 (×11): qty 60

## 2023-03-16 MED ORDER — LOPERAMIDE HCL 2 MG PO CAPS: 2.0000 mg | ORAL_CAPSULE | Freq: Two times a day (BID) | ORAL | Status: DC

## 2023-03-16 NOTE — Progress Notes (Addendum)
18 Days Post-Op   Subjective/Chief Complaint: NAEO. Tolerating TF. Per RN patient is having continuous loose stools.  Cardiology at bedside, planning for diuresis. TRH at bedside, planning to give 1 u p RBC for hgb 7.0 today   Objective: Vital signs in last 24 hours: Temp:  [97.4 F (36.3 C)-98.8 F (37.1 C)] 97.6 F (36.4 C) (09/13 0409) Pulse Rate:  [62-78] 67 (09/13 0409) Resp:  [15-23] 15 (09/13 0409) BP: (109-131)/(69-87) 118/87 (09/13 0409) SpO2:  [96 %-100 %] 96 % (09/13 0409) Weight:  [106.7 kg] 106.7 kg (09/13 0409) Last BM Date : 03/15/23  Intake/Output from previous day: 09/12 0701 - 09/13 0700 In: 4087.6 [I.V.:295.9; NG/GT:3791.7] Out: 700 [Urine:700] Intake/Output this shift: No intake/output data recorded. Alert, NAD Cortrak in place, tube feeds running at 55 mL /hr Abdomen soft and nondistended. Wound vac in place on midline wound, serosanguinous fluid in canister.  Neuro: oriented to person and hospital, [facility]   Lab Results:  Recent Labs    03/15/23 0404 03/16/23 0410  WBC 3.8* 3.3*  HGB 7.2* 7.0*  HCT 23.2* 22.9*  PLT 219 202   BMET Recent Labs    03/15/23 0404 03/16/23 0410  NA 152* 149*  K 4.1 3.8  CL 112* 115*  CO2 30 29  GLUCOSE 131* 133*  BUN 57* 52*  CREATININE 1.27* 1.17  CALCIUM 9.4 8.9   PT/INR Recent Labs    03/15/23 0404  LABPROT 17.0*  INR 1.4*   ABG No results for input(s): "PHART", "HCO3" in the last 72 hours.  Invalid input(s): "PCO2", "PO2"  Studies/Results: DG Abd Portable 1V  Result Date: 03/14/2023 CLINICAL DATA:  [id number] Encounter for feeding tube placement [id number] EXAM: PORTABLE ABDOMEN - 1 VIEW COMPARISON:  X-ray abdomen 03/12/2023 FINDINGS: Two consecutive radiographs of the abdomen with the most recent radiograph (10:47 a.m.) demonstrating enteric tube overlying the expected region of the third portion of the duodenum. (Interval retraction of the enteric tube from the x-ray abdomen 03/14/2023 10:07 a.m.). The  bowel gas pattern is normal. No radio-opaque calculi or other significant radiographic abnormality are seen. IMPRESSION: 1. Two consecutive radiographs of the abdomen with the most recent radiograph (10:47 a.m.) demonstrating enteric tube overlying the expected region of the third portion of the duodenum. 2. Nonobstructive bowel gas pattern. Electronically Signed   By: Tish Frederickson M.D.   On: 03/14/2023 11:05   DG Abd Portable 1V  Result Date: 03/14/2023 CLINICAL DATA:  [id number] Encounter for feeding tube placement [id number] EXAM: PORTABLE ABDOMEN - 1 VIEW COMPARISON:  X-ray abdomen 03/12/2023 FINDINGS: Two consecutive radiographs of the abdomen with the most recent radiograph (10:47 a.m.) demonstrating enteric tube overlying the expected region of the third portion of the duodenum. (Interval retraction of the enteric tube from the x-ray abdomen 03/14/2023 10:07 a.m.). The bowel gas pattern is normal. No radio-opaque calculi or other significant radiographic abnormality are seen. IMPRESSION: 1. Two consecutive radiographs of the abdomen with the most recent radiograph (10:47 a.m.) demonstrating enteric tube overlying the expected region of the third portion of the duodenum. 2. Nonobstructive bowel gas pattern. Electronically Signed   By: Tish Frederickson M.D.   On: 03/14/2023 11:05    Anti-infectives: Anti-infectives (From admission, onward)    Start     Dose/Rate Route Frequency Ordered Stop   03/14/23 0700  cefTRIAXone (ROCEPHIN) 1 g in sodium chloride 0.9 % 100 mL IVPB  Status:  Discontinued        1 g 200 mL/hr over 30 Minutes  Intravenous Daily 03/14/23 0621 03/14/23 0759   03/14/23 0630  azithromycin (ZITHROMAX) 500 mg in sodium chloride 0.9 % 250 mL IVPB  Status:  Discontinued        500 mg 250 mL/hr over 60 Minutes Intravenous Daily 03/14/23 0621 03/14/23 0759   03/04/23 1000  meropenem (MERREM) 1 g in sodium chloride 0.9 % 100 mL IVPB        1 g 200 mL/hr over 30 Minutes Intravenous Every 12  hours 03/04/23 0855 03/10/23 2132   03/01/23 1500  ceFEPIme (MAXIPIME) 2 g in sodium chloride 0.9 % 100 mL IVPB        2 g 200 mL/hr over 30 Minutes Intravenous Every 12 hours 03/01/23 1124 03/02/23 0356   03/01/23 0300  ceFEPIme (MAXIPIME) 2 g in sodium chloride 0.9 % 100 mL IVPB  Status:  Discontinued        2 g 200 mL/hr over 30 Minutes Intravenous Every 24 hours 02/28/23 1141 03/01/23 1124   02/27/2023 1445  piperacillin-tazobactam (ZOSYN) IVPB 3.375 g  Status:  Discontinued        3.375 g 100 mL/hr over 30 Minutes Intravenous Every 8 hours 02/19/2023 1436 02/14/2023 1445   02/15/2023 1445  ceFEPIme (MAXIPIME) 2 g in sodium chloride 0.9 % 100 mL IVPB  Status:  Discontinued        2 g 200 mL/hr over 30 Minutes Intravenous Every 12 hours 02/04/2023 1446 02/28/23 1141   02/11/2023 1445  metroNIDAZOLE (FLAGYL) IVPB 500 mg        500 mg 100 mL/hr over 60 Minutes Intravenous Every 12 hours 02/22/2023 1446 03/02/23 1719   02/09/2023 1015  meropenem (MERREM) 1 g in sodium chloride 0.9 % 100 mL IVPB        1 g 200 mL/hr over 30 Minutes Intravenous  Once 02/13/2023 0917 02/12/2023 1743       Assessment/Plan: -POD#18 s/p Exploratory laparotomy small bowel resection and primary anastomosis 8/26 Dr. Luisa Hart - approximately 120 cm of small bowel remaining. He has started having diarrhea - Start banatrol 60 mL BID. start imodium 2 mg BID today per tube and titrate up as needed.  - appreciate SLP eval, going for DG swallow study today at 0900 - abdominal wound vac MWF >> likely D/C VAC on Monday    Adam Phenix PA-C 03/16/2023

## 2023-03-16 NOTE — Progress Notes (Signed)
ANTICOAGULATION CONSULT NOTE-Follow Up  Pharmacy Consult for Heparin Indication: atrial fibrillation  Allergies  Allergen Reactions   Penicillin G     Other reaction(s): Drowsy   Penicillins Hives    Did it involve swelling of the face/tongue/throat, SOB, or low BP? No Did it involve sudden or severe rash/hives, skin peeling, or any reaction on the inside of your mouth or nose? yes Did you need to seek medical attention at a hospital or doctor's office? yes When did it last happen?  child     If all above answers are "NO", may proceed with cephalosporin use.    Patient Measurements: Height: 5' 9.02" (175.3 cm) Weight: 106.7 kg (235 lb 3.7 oz) IBW/kg (Calculated) : 70.74 Heparin Dosing Weight: 95 kg  Vital Signs: Temp: 97.7 F (36.5 C) (09/13 1405) Temp Source: Oral (09/13 1405) BP: 96/59 (09/13 1405) Pulse Rate: 77 (09/13 1405)  Labs: Recent Labs    03/14/23 0620 03/14/23 1759 03/15/23 0404 03/15/23 1430 03/16/23 0410 03/16/23 1315  HGB 7.3*  --  7.2*  --  7.0*  --   HCT 24.3*  --  23.2*  --  22.9*  --   PLT 245  --  219  --  202  --   LABPROT  --   --  17.0*  --   --   --   INR  --   --  1.4*  --   --   --   HEPARINUNFRC 0.20*   < > 0.11* 0.61 0.14* 0.19*  CREATININE 1.15  --  1.27*  --  1.17  --    < > = values in this interval not displayed.    Estimated Creatinine Clearance: 47.5 mL/min (by C-G formula based on SCr of 1.17 mg/dL).   Medical History: Past Medical History:  Diagnosis Date   Anemia    Aortic insufficiency    Chest pain    H/O: GI bleed    Hyperlipidemia    Hypertension    Leg cramps    Stroke [facility])     Assessment: 89 YOM s/p SBR on Eliqius for atrial fibrillation. Tracheostomy planned 9/9 - plan to hold Eliquis and start heparin until trach completed.  Heparin level remains subtherapeutic (0.19) on infusion at 1200 units/hr. No issues with line or bleeding reported per RN. (Wonder if 0.61 heparin level 9/12 afternoon was  inaccurate)  Goal of Therapy:  Heparin level 0.3-0.5 units/ml aPTT 66-85 seconds Monitor platelets by anticoagulation protocol: Yes   Plan:  Increase heparin to 1350 units / hr Will f/u 8hr heparin level Daily heparin level and CBC.  Reece Leader, Colon Flattery, BCCP Clinical Pharmacist  03/16/2023 3:40 PM   [facility] pharmacy phone numbers are listed on [email or web address]

## 2023-03-16 NOTE — Progress Notes (Signed)
Subjective:  Denies SSCP, palpitations or Dyspnea Speech garbled hard to understand  Objective:  Vitals:   03/15/23 2044 03/15/23 2345 03/16/23 0409 03/16/23 0903  BP:  121/73 118/87   Pulse:  62 67 69  Resp: (!) 23 15 15 18   Temp:  97.6 F (36.4 C) 97.6 F (36.4 C)   TempSrc:  Oral Oral   SpO2: 100% 100% 96% 99%  Weight:   106.7 kg   Height:        Intake/Output from previous day:  Intake/Output Summary (Last 24 hours) at 03/16/2023 0924 Last data filed at 03/16/2023 0600 Gross per 24 hour  Intake 4087.55 ml  Output 700 ml  Net 3387.55 ml    Physical Exam:  Overweight black male [place] tube in place Post expl lab and bowel resection UE edema with dressing on left arm  Plus one bilateral edema No murmur Lungs clear anteriorly   Lab Results: Basic Metabolic Panel: Recent Labs    03/15/23 0404 03/16/23 0410  NA 152* 149*  K 4.1 3.8  CL 112* 115*  CO2 30 29  GLUCOSE 131* 133*  BUN 57* 52*  CREATININE 1.27* 1.17  CALCIUM 9.4 8.9  MG 2.1 2.1  PHOS 3.8  --    Liver Function Tests: Recent Labs    03/14/23 0620  AST 20  ALT 21  ALKPHOS 57  BILITOT 0.5  PROT 6.4*  ALBUMIN 2.4*   No results for input(s): "LIPASE", "AMYLASE" in the last 72 hours. CBC: Recent Labs    03/15/23 0404 03/16/23 0410  WBC 3.8* 3.3*  HGB 7.2* 7.0*  HCT 23.2* 22.9*  MCV 99.6 100.4*  PLT 219 202     Imaging: DG Abd Portable 1V  Result Date: 03/14/2023 CLINICAL DATA:  [id number] Encounter for feeding tube placement [id number] EXAM: PORTABLE ABDOMEN - 1 VIEW COMPARISON:  X-ray abdomen 03/12/2023 FINDINGS: Two consecutive radiographs of the abdomen with the most recent radiograph (10:47 a.m.) demonstrating enteric tube overlying the expected region of the third portion of the duodenum. (Interval retraction of the enteric tube from the x-ray abdomen 03/14/2023 10:07 a.m.). The bowel gas pattern is normal. No radio-opaque calculi or other significant radiographic abnormality  are seen. IMPRESSION: 1. Two consecutive radiographs of the abdomen with the most recent radiograph (10:47 a.m.) demonstrating enteric tube overlying the expected region of the third portion of the duodenum. 2. Nonobstructive bowel gas pattern. Electronically Signed   By: Tish Frederickson M.D.   On: 03/14/2023 11:05   DG Abd Portable 1V  Result Date: 03/14/2023 CLINICAL DATA:  [id number] Encounter for feeding tube placement [id number] EXAM: PORTABLE ABDOMEN - 1 VIEW COMPARISON:  X-ray abdomen 03/12/2023 FINDINGS: Two consecutive radiographs of the abdomen with the most recent radiograph (10:47 a.m.) demonstrating enteric tube overlying the expected region of the third portion of the duodenum. (Interval retraction of the enteric tube from the x-ray abdomen 03/14/2023 10:07 a.m.). The bowel gas pattern is normal. No radio-opaque calculi or other significant radiographic abnormality are seen. IMPRESSION: 1. Two consecutive radiographs of the abdomen with the most recent radiograph (10:47 a.m.) demonstrating enteric tube overlying the expected region of the third portion of the duodenum. 2. Nonobstructive bowel gas pattern. Electronically Signed   By: Tish Frederickson M.D.   On: 03/14/2023 11:05    Cardiac Studies:  ECG:  afib LAD no acute ST changes    Telemetry:  afib rates 60-85 bpm  Echo:  02/13/2023  1. Left ventricular ejection fraction, by  estimation, is 40 to 45%. The  left ventricle has mildly decreased function. The left ventricle  demonstrates global hypokinesis. There is moderate concentric left  ventricular hypertrophy. Left ventricular  diastolic function could not be evaluated.   2. Right ventricular systolic function is normal. The right ventricular  size is normal. There is normal pulmonary artery systolic pressure. The  estimated right ventricular systolic pressure is 31.7 mmHg.   3. Left atrial size was mildly dilated.   4. The mitral valve is degenerative. Trivial mitral valve regurgitation.   No evidence of mitral stenosis.   5. The aortic valve is tricuspid. Aortic valve regurgitation is mild.  Aortic valve sclerosis/calcification is present, without any evidence of  aortic stenosis. Aortic valve area, by VTI measures 2.28 cm. Aortic valve  mean gradient measures 5.0 mmHg.  Aortic valve Vmax measures 1.41 m/s.   6. The inferior vena cava is normal in size with greater than 50%  respiratory variability, suggesting right atrial pressure of 3 mmHg.   7. Ascending aorta measurements are within normal limits for age when  indexed to body surface area.   Medications:    sodium chloride   Intravenous Once   acetaminophen  1,000 mg Per Tube TID   amiodarone  200 mg Per Tube BID   Chlorhexidine Gluconate Cloth  6 each Topical Daily   feeding supplement (PROSource TF20)  60 mL Per Tube Daily   fiber supplement (BANATROL TF)  60 mL Per Tube BID   free water  300 mL Per Tube Q4H   ipratropium  0.5 mg Nebulization BID   levalbuterol  1.25 mg Nebulization BID   loperamide HCl  2 mg Per Tube BID   nutrition supplement (JUVEN)  1 packet Per Tube BID BM   mouth rinse  15 mL Mouth Rinse Q2H   scopolamine  1 patch Transdermal Q72H   sodium chloride flush  10-40 mL Intracatheter Q12H      sodium chloride Stopped (03/14/23 2139)   sodium chloride Stopped (03/14/23 2140)   feeding supplement (OSMOLITE 1.5 CAL) 50 mL/hr at 03/16/23 0542   heparin 1,200 Units/hr (03/16/23 0542)    Assessment/Plan:  Permanent Atrial Fibrillation  - First diagnosed with afib in 06/2022. Well rate controlled  - Now on amiodarone 200 mg BID per tube- OK to continue for rate control as patient is acutely ill/admitted. However, I do not expect he will need to be on amiodarone long term -  On heparin transition to eliquis prior to DC. CHADS-VASc 8    New cardiomyopathy with reduced EF  - Echocardiogram this admission showed EF 40-45% with moderate LVH, normal RV function. EF was previously 55-60% in 03/2019   - Suspect EF is reduced in the setting of acute illness- at the time of echo, patient had SBO and septic shock  - Patient not a candidate for ischemic evaluation due to advanced age, metabolic encephalopathy, recent small bowel resection - Palliative care has been consulted- follow up after palliative care discussion. If patient/family wants aggressive treatment, can add GDMT as BP and renal function tolerates    Otherwise per primary - SBO s/p ex lap and small bowel resection  - Acute respiratory failure with hypoxia and hypercapnia, septic shock, aspiration PNA - Acute metabolic encephalopathy   - AKI  - Hypernatremia - Type 2 DM  - Anemia of chronic disease, anemia due to critical illness  - Generalized deconditioning, wheelchair-bound  - Moderate malnutrition  Charlton Haws 03/16/2023, 9:24 AM

## 2023-03-16 NOTE — Evaluation (Signed)
Modified Barium Swallow Study  Patient Details  Name: Stephen Flynn MRN: [id number] Date of Birth: 12/08/1927  Today's Date: 03/16/2023  Modified Barium Swallow completed.  Full report located under Chart Review in the Imaging Section.  History of Present Illness 89 y/o male admitted 8/25 with abdominal pain, SOB with bowel obstruction. 8/26 developed hypotension and required ex-lap with small bowel resection, remained intubated post op. Extubated 8/27. Reintubated 9/1-9/6. 9/9 with R head turn and decr cognition noted from therapist but MRI negative. PMhx: GIB, stroke, HTN, Afib, neuropathy, CHF, obesity   Clinical Impression Pt's oropharyngeal swallow is very disorganized, with reduced bolus cohesion and repetitive lingual movements as boluses spill back toward his pyriform sinuses. Oral residue spills to his valleculae while he is try to get another bolus into his mouth. Pt has relatively improved pharyngeal function although with reduced anterior hyoid movement and near absent epiglottic deflection (question impact of cortrak). What becomes more concerning, is the retrograde flow that occurs back into the pharynx across the study. There appears to be an area in his cervical esophagus at which the barium collects (question outpouching but difficult to visualize around shadow from shoulder), and in between swallows it comes back up and fills his pyriform sinuses. Once his pyriform sinuses are full, as liquids spill posteriorly before the swallow, they end up being diverted into his airway and aspirated (PAS 7). Could consider starting with nectar thick liquids only, as he did not aspirate these even when backflow was noted, although there is likely to be risk across a meal with any consistency when considering not just backflow but also mentation and positioning. Discussed findings with MD. Given current POC, he recommends that pt remain NPO for now.  Factors that may increase risk of adverse event  in presence of aspiration [employer] Clearance Coots 2021): Poor general health and/or compromised immunity;Respiratory or GI disease;Reduced cognitive function;Limited mobility;Frail or deconditioned;Dependence for feeding and/or oral hygiene;Presence of tubes (ETT, trach, NG, etc.)  Swallow Evaluation Recommendations Recommendations: NPO Medication Administration: Via alternative means      Mahala Menghini., M.A. CCC-SLP Acute Rehabilitation Services Office [phone]  Secure chat preferred  03/16/2023,11:50 AM

## 2023-03-16 NOTE — Progress Notes (Signed)
PROGRESS NOTE    Stephen Flynn  ZOX:[id number] DOB: 02-May-1928 DOA: 2023-03-02 PCP: Creola Corn, MD    Brief Narrative:  89 year old male with history of persistent atrial fibrillation on Eliquis, wheelchair-bound, chronic diastolic heart failure, hypertension, unspecified CVA, BPH, diabetes mellitus type 2, thoracic AAA presented with worsening abdominal pain and no bowel movement 3 days prior to presentation.  On presentation, he was in A-fib with RVR requiring Cardizem and heparin drips.  CT of abdomen and pelvis showed small bowel obstruction and moderate ascites.  General surgery initially recommended conservative management with NG tube decompression; he was initially admitted to [facility] service.  Subsequently, he became confused/nonverbal with hypotension and desaturation with increased abdominal tenderness.  He was transferred to ICU.  He underwent exploratory laparotomy and small bowel resection on 02/05/2023; returned back to ICU intubated.  He was extubated on 02/27/2023.  He was reintubated on 03/04/2023.  He had bronchoscopy with BAL on 03/04/2023.  Received 1 unit packed red cells on 03/08/2023 for hemoglobin of 6.6.  He was extubated again on 03/09/2023. He was on TPN which has already been discontinued.  He is currently on cortrak feedings.  He was transferred to [facility] service from 03/13/2023 onwards.  Patient remains in the hospital.  Recurrent aspiration.  Unable to eat safely.    Assessment & Plan:   Small bowel obstruction status post expiratory laparotomy and small bowel resection -Status post expiratory laparotomy with bowel resection in 8/26.  Wound VAC and postop management per general surgery. -Currently on tube feeds postpyloric. -Started having diarrhea.  Flexi-Seal temporarily.  Imodium.  Banana flakes.   Acute respiratory failure with hypoxia and hypercapnia Septic shock: Present on admission, resolved Aspiration pneumonia -Concerns of fluid overload and aspiration.  Patient has  been now been extubated since 9/6.  Bronchodilators.  I-S/flutter valve(unable to participate on chest physiotherapy)  Completed antibiotics meropenem (9/1 - 9/7) 9/11, possible aspiration again overnight, procalcitonin less than 0.1.  Holding off on antibiotics. Respiratory status has been improving.  Unable to participate on chest.  Therapy.  Acute metabolic and septic encephalopathy -Waxing and waning mental status.  Still slow to respond.  Monitor. -Delirium precautions.  Mental status somehow improving now.   Persistent A-fib with RVR -Cardiology team following.  Continue heparin, plans for Xarelto when able to take p.o. unsure when he can eat.  Rate controlled on amiodarone.   Acute systolic heart failure in a patient with history of chronic diastolic heart failure, EF 45% Echo shows EF of 45%.  Plans for GDMT.  Unable to tolerate.   AKI Resolved   Hypernatremia Sodium was trending up.  Free water increased to 300 mL every 4 hours on 9/12.  Sodium trending down today.   Diabetes mellitus type 2 -A1c 6.5.  Continue CBGs with SSI.   Anemia of chronic disease Anemia due to critical illness -Required 1 unit packed red cell transfusion on 03/08/2023 for hemoglobin of 6.6.   Hemoglobin 7 today.   Will benefit with higher hemoglobin.  Will transfuse 1 unit of blood today.  Consented by patient's daughter.     Generalized deconditioning Baseline debility: Wheelchair-bound Goals of care -Poor chances of recovery and meaningful rehab.  Palliative care following.  Currently remains full code.  Unsure patient will be able to eat soon. -Patient is appropriate for palliation and hospice if family agrees.   Moderate malnutrition -Follow dietitian/nutrition recommendations.  TPN discontinued on 03/09/2023. -Currently on cortrak feeds -Speech swallow evaluation today, very poor study and  unable to safely swallow.  Continue to keep NPO.     DVT prophylaxis: Heparin drip Code Status:  Full Family Communication: Daughter called and updated on the phone.  Consent for blood transfusion taken. Disposition Plan: Status is: Inpatient Remains inpatient appropriate because: Of severity of illness     Subjective:  Patient seen and examined with cardiology and surgery providers.  Patient is able to answer appropriately few basic questions.  Difficult to understand due to garbled speech and upper airway sounds.  He does follow simple commands.  He denies any pain or difficulties. Telemetry monitor shows A-fib but mostly rate controlled.   Examination:  General: Sick looking.  Frail and Extremely debilitated. Cardiovascular: S1-S2 normal.  Regular rate rhythm. Respiratory: Bilateral conducted upper airway sounds.  Poor inspiratory efforts. Gastrointestinal: Soft.  Midline surgical incision with wound VAC.  Mildly tender along the incision site.  Bowel sound present. Ext: 1+ edema all extremities. Neuro: Alert and awake.  He is oriented to his name.  Follows simple commands.  Moves all extremities but very debilitated. Musculoskeletal: Debilitated.  Poor muscle tone.       Objective: Vitals:   03/16/23 0409 03/16/23 0903 03/16/23 1116 03/16/23 1146  BP: 118/87  124/81 118/84  Pulse: 67 69  72  Resp: 15 18 16 16   Temp: 97.6 F (36.4 C)  97.6 F (36.4 C) 97.6 F (36.4 C)  TempSrc: Oral  Oral Oral  SpO2: 96% 99% 98% 96%  Weight: 106.7 kg     Height:        Intake/Output Summary (Last 24 hours) at 03/16/2023 1238 Last data filed at 03/16/2023 0600 Gross per 24 hour  Intake 4087.55 ml  Output 700 ml  Net 3387.55 ml   Filed Weights   03/14/23 0451 03/15/23 0500 03/16/23 0409  Weight: 110.5 kg 106.7 kg 106.7 kg    Scheduled Meds:  acetaminophen  1,000 mg Oral TID   amiodarone  200 mg Oral BID   Chlorhexidine Gluconate Cloth  6 each Topical Daily   feeding supplement (PROSource TF20)  60 mL Per Tube Daily   fiber supplement (BANATROL TF)  60 mL Per Tube BID    free water  300 mL Per Tube Q4H   ipratropium  0.5 mg Nebulization BID   levalbuterol  1.25 mg Nebulization BID   loperamide HCl  2 mg Oral BID   nutrition supplement (JUVEN)  1 packet Per Tube BID BM   mouth rinse  15 mL Mouth Rinse Q2H   scopolamine  1 patch Transdermal Q72H   sodium chloride flush  10-40 mL Intracatheter Q12H   Continuous Infusions:  sodium chloride Stopped (03/14/23 2139)   sodium chloride Stopped (03/14/23 2140)   feeding supplement (OSMOLITE 1.5 CAL) 50 mL/hr at 03/16/23 0542   heparin 1,200 Units/hr (03/16/23 0542)    Nutritional status Signs/Symptoms: mild fat depletion, severe muscle depletion Interventions: Refer to RD note for recommendations Body mass index is 34.72 kg/m.  Data Reviewed:   CBC: Recent Labs  Lab 03/12/23 0430 03/13/23 0655 03/14/23 0620 03/15/23 0404 03/16/23 0410  WBC 4.5 4.3 4.6 3.8* 3.3*  HGB 7.4* 7.4* 7.3* 7.2* 7.0*  HCT 24.0* 24.3* 24.3* 23.2* 22.9*  MCV 99.2 97.6 99.2 99.6 100.4*  PLT 240 251 245 219 202   Basic Metabolic Panel: Recent Labs  Lab 03/10/23 0205 03/11/23 0200 03/12/23 0430 03/14/23 0620 03/15/23 0404 03/16/23 0410  NA 145 147* 150* 148* 152* 149*  K 4.7 4.6 4.3 4.1 4.1  3.8  CL 111 116* 116* 113* 112* 115*  CO2 25 25 28 30 30 29   GLUCOSE 109* 125* 87 120* 131* 133*  BUN 86* 82* 70* 60* 57* 52*  CREATININE 1.14 1.14 1.06 1.15 1.27* 1.17  CALCIUM 8.8* 8.9 9.2 9.1 9.4 8.9  MG 2.4  --   --  2.1 2.1 2.1  PHOS 4.1  --   --   --  3.8  --    GFR: Estimated Creatinine Clearance: 47.5 mL/min (by C-G formula based on SCr of 1.17 mg/dL). Liver Function Tests: Recent Labs  Lab 03/14/23 0620  AST 20  ALT 21  ALKPHOS 57  BILITOT 0.5  PROT 6.4*  ALBUMIN 2.4*   No results for input(s): "LIPASE", "AMYLASE" in the last 168 hours. No results for input(s): "AMMONIA" in the last 168 hours. Coagulation Profile: Recent Labs  Lab 03/15/23 0404  INR 1.4*   Cardiac Enzymes: No results for input(s):  "CKTOTAL", "CKMB", "CKMBINDEX", "TROPONINI" in the last 168 hours. BNP (last 3 results) No results for input(s): "PROBNP" in the last 8760 hours. HbA1C: No results for input(s): "HGBA1C" in the last 72 hours. CBG: Recent Labs  Lab 03/15/23 2029 03/15/23 2351 03/16/23 0428 03/16/23 0755 03/16/23 1219  GLUCAP 106* 89 115* 117* 126*   Lipid Profile: No results for input(s): "CHOL", "HDL", "LDLCALC", "TRIG", "CHOLHDL", "LDLDIRECT" in the last 72 hours. Thyroid Function Tests: No results for input(s): "TSH", "T4TOTAL", "FREET4", "T3FREE", "THYROIDAB" in the last 72 hours. Anemia Panel: No results for input(s): "VITAMINB12", "FOLATE", "FERRITIN", "TIBC", "IRON", "RETICCTPCT" in the last 72 hours. Sepsis Labs: Recent Labs  Lab 03/14/23 0620  PROCALCITON <0.10    Recent Results (from the past 240 hour(s))  Culture, blood (Routine X 2) w Reflex to ID Panel     Status: None (Preliminary result)   Collection Time: 03/14/23  9:40 AM   Specimen: BLOOD LEFT HAND  Result Value Ref Range Status   Specimen Description BLOOD LEFT HAND  Final   Special Requests   Final    BOTTLES DRAWN AEROBIC AND ANAEROBIC Blood Culture results may not be optimal due to an inadequate volume of blood received in culture bottles   Culture   Final    NO GROWTH 2 DAYS Performed at [facility], [address] [place], [place] [address]    Report Status PENDING  Incomplete  Culture, blood (Routine X 2) w Reflex to ID Panel     Status: None (Preliminary result)   Collection Time: 03/14/23  9:40 AM   Specimen: BLOOD LEFT HAND  Result Value Ref Range Status   Specimen Description BLOOD LEFT HAND  Final   Special Requests   Final    AEROBIC BOTTLE ONLY Blood Culture results may not be optimal due to an inadequate volume of blood received in culture bottles   Culture   Final    NO GROWTH 2 DAYS Performed at [facility], [address] [place], [place] [address]    Report Status PENDING   Incomplete         Radiology Studies: DG Swallowing Func-Speech Pathology  Result Date: 03/16/2023 Table formatting from the original result was not included. Modified Barium Swallow Study Patient Details Name: Garnie Blodgett MRN: [id number] Date of Birth: [birth year] Today's Date: 03/16/2023 HPI/PMH: HPI: 89 y/o male admitted 8/25 with abdominal pain, SOB with bowel obstruction. 8/26 developed hypotension and required ex-lap with small bowel resection, remained intubated post op. Extubated 8/27. Reintubated 9/1-9/6.  9/9 with R head turn and decr cognition noted from therapist but MRI negative. PMhx: GIB, stroke, HTN, Afib, neuropathy, CHF, obesity Clinical Impression: Clinical Impression: Pt's oropharyngeal swallow is very disorganized, with reduced bolus cohesion and repetitive lingual movements as boluses spill back toward his pyriform sinuses. Oral residue spills to his valleculae while he is try to get another bolus into his mouth. Pt has relatively improved pharyngeal function although with reduced anterior hyoid movement and near absent epiglottic deflection (question impact of cortrak). What becomes more concerning, is the retrograde flow that occurs back into the pharynx across the study. There appears to be an area in his cervical esophagus at which the barium collects (question outpouching but difficult to visualize around shadow from shoulder), and in between swallows it comes back up and fills his pyriform sinuses. Once his pyriform sinuses are full, as liquids spill posteriorly before the swallow, they end up being diverted into his airway and aspirated (PAS 7). Could consider starting with nectar thick liquids only, as he did not aspirate these even when backflow was noted, although there is likely to be risk across a meal with any consistency when considering not just backflow but also mentation and positioning. Discussed findings with MD. Given current POC, he recommends that pt remain NPO for  now. Factors that may increase risk of adverse event in presence of aspiration Rubye Oaks & Clearance Coots 2021): Factors that may increase risk of adverse event in presence of aspiration Rubye Oaks & Clearance Coots 2021): Poor general health and/or compromised immunity; Respiratory or GI disease; Reduced cognitive function; Limited mobility; Frail or deconditioned; Dependence for feeding and/or oral hygiene; Presence of tubes (ETT, trach, NG, etc.) Recommendations/Plan: Swallowing Evaluation Recommendations Swallowing Evaluation Recommendations Recommendations: NPO Medication Administration: Via alternative means Treatment Plan Treatment Plan Treatment recommendations: Therapy as outlined in treatment plan below Follow-up recommendations: Skilled nursing-short term rehab (<3 hours/day) Functional status assessment: Patient has had a recent decline in their functional status and/or demonstrates limited ability to make significant improvements in function in a reasonable and predictable amount of time. Treatment frequency: Min 2x/week Treatment duration: 2 weeks Interventions: Aspiration precaution training; Trials of upgraded texture/liquids; Patient/family education Recommendations Recommendations for follow up therapy are one component of a multi-disciplinary discharge planning process, led by the attending physician.  Recommendations may be updated based on patient status, additional functional criteria and insurance authorization. Assessment: Orofacial Exam: Orofacial Exam Oral Cavity - Dentition: Missing dentition Anatomy: Anatomy: Other (Comment) (see clinical impressions) Boluses Administered: Boluses Administered Boluses Administered: Thin liquids (Level 0); Mildly thick liquids (Level 2, nectar thick); Moderately thick liquids (Level 3, honey thick); Puree; Solid  Oral Impairment Domain: Oral Impairment Domain Lip Closure: Escape progressing to mid-chin Tongue control during bolus hold: Posterior escape of greater than half of  bolus Bolus preparation/mastication: Slow prolonged chewing/mashing with complete recollection Bolus transport/lingual motion: Repetitive/disorganized tongue motion Oral residue: Residue collection on oral structures Location of oral residue : Tongue; Palate Initiation of pharyngeal swallow : Pyriform sinuses  Pharyngeal Impairment Domain: Pharyngeal Impairment Domain Soft palate elevation: Trace column of contrast or air between SP and PW (difficult to see how far it escapes to) Laryngeal elevation: Complete superior movement of thyroid cartilage with complete approximation of arytenoids to epiglottic petiole Anterior hyoid excursion: Partial anterior movement Epiglottic movement: No inversion Laryngeal vestibule closure: Complete, no air/contrast in laryngeal vestibule Pharyngeal stripping wave : Present - complete Pharyngeal contraction (A/P view only): N/A Pharyngoesophageal segment opening: Partial distention/partial duration, partial obstruction of flow Tongue base  retraction: No contrast between tongue base and posterior pharyngeal wall (PPW) Pharyngeal residue: Collection of residue within or on pharyngeal structures Location of pharyngeal residue: Pyriform sinuses  Esophageal Impairment Domain: Esophageal Impairment Domain Esophageal clearance upright position: Esophageal retention with retrograde flow through the PES Pill: No data recorded Penetration/Aspiration Scale Score: Penetration/Aspiration Scale Score 1.  Material does not enter airway: Mildly thick liquids (Level 2, nectar thick); Moderately thick liquids (Level 3, honey thick); Puree; Solid 2.  Material enters airway, remains ABOVE vocal cords then ejected out: Solid 7.  Material enters airway, passes BELOW cords and not ejected out despite cough attempt by patient: Thin liquids (Level 0) Compensatory Strategies: No data recorded  General Information: Caregiver present: No  Diet Prior to this Study: NPO; Cortrak/Small bore NG tube   Temperature :  Normal   Respiratory Status: WFL   Supplemental O2: Nasal cannula   History of Recent Intubation: Yes  Behavior/Cognition: Alert; Cooperative; Pleasant mood; Confused; Requires cueing Self-Feeding Abilities: Dependent for feeding Baseline vocal quality/speech: Normal No data recorded Volitional Swallow: Able to elicit Exam Limitations: Limited visibility Goal Planning: Prognosis for improved oropharyngeal function: Fair Barriers to Reach Goals: Cognitive deficits; Other (Comment) (suspect underlying esophageal component) No data recorded Patient/Family Stated Goal: none stated Consulted and agree with results and recommendations: Pt unable/family or caregiver not available; Physician Pain: Pain Assessment Pain Assessment: Faces Faces Pain Scale: 0 Pain Location: RLE with movement Pain Descriptors / Indicators: Discomfort; Sore; Grimacing; Moaning Pain Intervention(s): Limited activity within patient's tolerance; Monitored during session; Repositioned End of Session: Start Time:SLP Start Time (ACUTE ONLY): 1011 Stop Time: SLP Stop Time (ACUTE ONLY): 1029 Time Calculation:SLP Time Calculation (min) (ACUTE ONLY): 18 min Charges: SLP Evaluations $ SLP Speech Visit: 1 Visit SLP Evaluations $BSS Swallow: 1 Procedure $MBS Swallow: 1 Procedure SLP visit diagnosis: SLP Visit Diagnosis: Dysphagia, pharyngoesophageal phase (R13.14); Dysphagia, oropharyngeal phase (R13.12) Past Medical History: Past Medical History: Diagnosis Date  Anemia   Aortic insufficiency   Chest pain   H/O: GI bleed   Hyperlipidemia   Hypertension   Leg cramps   Stroke [facility])  Past Surgical History: Past Surgical History: Procedure Laterality Date  BOWEL RESECTION N/A 02/17/2023  Procedure: SMALL BOWEL RESECTION;  Surgeon: Harriette Bouillon, MD;  Location: [facility] OR;  Service: General;  Laterality: N/A;  CARDIOVASCULAR STRESS TEST  09/04/2005  LAPAROTOMY N/A 02/21/2023  Procedure: EXPLORATORY LAPAROTOMY;  Surgeon: Harriette Bouillon, MD;  Location: [facility] OR;  Service:  General;  Laterality: N/A;  US ECHOCARDIOGRAPHY  11/26/2007  ef 55-60% Mahala Menghini., M.A. CCC-SLP Acute Rehabilitation Services Office [phone] Secure chat preferred 03/16/2023, 11:51 AM          LOS: 19 days   Total time spent: 35 minutes  Dorcas Carrow, MD [facility]  If 7PM-7AM, please contact night-coverage  03/16/2023, 12:38 PM

## 2023-03-16 NOTE — Progress Notes (Signed)
Wound vac dressing change as per order, pt tolerated the procedure well

## 2023-03-16 NOTE — Progress Notes (Signed)
ANTICOAGULATION CONSULT NOTE-Follow Up  Pharmacy Consult for Heparin Indication: atrial fibrillation  Allergies  Allergen Reactions   Penicillin G     Other reaction(s): Drowsy   Penicillins Hives    Did it involve swelling of the face/tongue/throat, SOB, or low BP? No Did it involve sudden or severe rash/hives, skin peeling, or any reaction on the inside of your mouth or nose? yes Did you need to seek medical attention at a hospital or doctor's office? yes When did it last happen?  child     If all above answers are "NO", may proceed with cephalosporin use.    Patient Measurements: Height: 5' 9.02" (175.3 cm) Weight: 106.7 kg (235 lb 3.7 oz) IBW/kg (Calculated) : 70.74 Heparin Dosing Weight: 95 kg  Vital Signs: Temp: 97.6 F (36.4 C) (09/13 0409) Temp Source: Oral (09/13 0409) BP: 118/87 (09/13 0409) Pulse Rate: 67 (09/13 0409)  Labs: Recent Labs    03/14/23 0620 03/14/23 1759 03/15/23 0404 03/15/23 1430 03/16/23 0410  HGB 7.3*  --  7.2*  --  7.0*  HCT 24.3*  --  23.2*  --  22.9*  PLT 245  --  219  --  202  LABPROT  --   --  17.0*  --   --   INR  --   --  1.4*  --   --   HEPARINUNFRC 0.20*   < > 0.11* 0.61 0.14*  CREATININE 1.15  --  1.27*  --   --    < > = values in this interval not displayed.    Estimated Creatinine Clearance: 43.7 mL/min (A) (by C-G formula based on SCr of 1.27 mg/dL (H)).   Medical History: Past Medical History:  Diagnosis Date   Anemia    Aortic insufficiency    Chest pain    H/O: GI bleed    Hyperlipidemia    Hypertension    Leg cramps    Stroke [facility])     Assessment: 89 YOM s/p SBR on Eliqius for atrial fibrillation. Tracheostomy planned 9/9 - plan to hold Eliquis and start heparin until trach completed.  Heparin level down to subtherapeutic (0.14) on infusion at 1100 units/hr. No issues with line or bleeding reported per RN. (Wonder if 0.61 heparin level 9/12 afternoon was inaccurate)  Goal of Therapy:  Heparin level  0.3-0.5 units/ml aPTT 66-85 seconds Monitor platelets by anticoagulation protocol: Yes   Plan:  Increase heparin to 1200 units / hr Will f/u 8hr heparin level  Christoper Fabian, PharmD, BCPS Please see amion for complete clinical pharmacist phone list 03/16/2023 4:54 AM

## 2023-03-17 DIAGNOSIS — T17908D Unspecified foreign body in respiratory tract, part unspecified causing other injury, subsequent encounter: Secondary | ICD-10-CM | POA: Diagnosis not present

## 2023-03-17 DIAGNOSIS — K56609 Unspecified intestinal obstruction, unspecified as to partial versus complete obstruction: Secondary | ICD-10-CM | POA: Diagnosis not present

## 2023-03-17 DIAGNOSIS — K219 Gastro-esophageal reflux disease without esophagitis: Secondary | ICD-10-CM | POA: Diagnosis not present

## 2023-03-17 DIAGNOSIS — J9601 Acute respiratory failure with hypoxia: Secondary | ICD-10-CM | POA: Diagnosis not present

## 2023-03-17 LAB — BASIC METABOLIC PANEL
Anion gap: 6 (ref 5–15)
BUN: 48 mg/dL — ABNORMAL HIGH (ref 8–23)
CO2: 29 mmol/L (ref 22–32)
Calcium: 8.8 mg/dL — ABNORMAL LOW (ref 8.9–10.3)
Chloride: 112 mmol/L — ABNORMAL HIGH (ref 98–111)
Creatinine, Ser: 1.12 mg/dL (ref 0.61–1.24)
GFR, Estimated: >60 mL/min (ref >60)
Glucose, Bld: 155 mg/dL — ABNORMAL HIGH (ref 70–99)
Potassium: 4.3 mmol/L (ref 3.5–5.1)
Sodium: 147 mmol/L — ABNORMAL HIGH (ref 135–145)

## 2023-03-17 LAB — GLUCOSE, CAPILLARY
Glucose-Capillary: 101 mg/dL — ABNORMAL HIGH (ref 70–99)
Glucose-Capillary: 108 mg/dL — ABNORMAL HIGH (ref 70–99)
Glucose-Capillary: 110 mg/dL — ABNORMAL HIGH (ref 70–99)
Glucose-Capillary: 115 mg/dL — ABNORMAL HIGH (ref 70–99)
Glucose-Capillary: 117 mg/dL — ABNORMAL HIGH (ref 70–99)
Glucose-Capillary: 127 mg/dL — ABNORMAL HIGH (ref 70–99)
Glucose-Capillary: 128 mg/dL — ABNORMAL HIGH (ref 70–99)

## 2023-03-17 LAB — HEPARIN LEVEL (UNFRACTIONATED)
Heparin Unfractionated: 0.32 [IU]/mL (ref 0.30–0.70)
Heparin Unfractionated: 0.34 [IU]/mL (ref 0.30–0.70)

## 2023-03-17 LAB — MAGNESIUM: Magnesium: 2 mg/dL (ref 1.7–2.4)

## 2023-03-17 NOTE — Progress Notes (Signed)
PROGRESS NOTE    Stephen Flynn  WUJ:[id number] DOB: [birth year] DOA: March 13, 2023 PCP: Creola Corn, MD    Brief Narrative:   89 year old male with past history of persistent atrial fibrillation on Eliquis, wheelchair-bound, chronic diastolic heart failure, hypertension, unspecified CVA, BPH, diabetes mellitus type 2, thoracic AAA presented to the hospital with worsening abdominal pain and no bowel movement 3 days prior to presentation.  In the ED he was noted to have A-fib with RVR requiring Cardizem and heparin drips.  CT of abdomen and pelvis showed small bowel obstruction and moderate ascites.  General surgery initially recommended conservative management with NG tube decompression but subsequently patient was,confused/nonverbal with hypotension and desaturation with increased abdominal tenderness.  He was then transferred to ICU.  He underwent exploratory laparotomy and small bowel resection on 02/27/2023; returned back to ICU intubated.  He was extubated on 02/27/2023.  He was reintubated on 03/04/2023.  He had bronchoscopy with BAL on 03/04/2023.  Received 1 unit packed red cells on 03/08/2023 for hemoglobin of 6.6.  He was extubated again on 03/09/2023. He was on TPN initially which has been discontinued and is currently on cortrak feedings.  He was transferred to [facility] service from 03/13/2023.  At this time, patient remains in the hospital.  Recurrent aspiration.  Unable to eat safely.    Assessment & Plan:   Small bowel obstruction status post expiratory laparotomy and small bowel resection -Status post expiratory laparotomy with bowel resection in 02/04/2023.  Wound VAC and postop management per general surgery.  Continue cortrak tube postpyloric feeding.  On Flexi-Seal Imodium.  Appears very weak and deconditioned.  Acute respiratory failure with hypoxia and hypercapnia Septic shock: Present on admission, resolved Aspiration pneumonia Patient was initially intubated and subsequently extubated on 9/6.   Continue bronchodilators, incentive spirometry, flutter valve if able to participate.  Patient has completed course of meropenem (9/1 - 9/7).  On 9/11, possible aspiration again but procalcitonin less than 0.1.  Holding off with antibiotic.  Continue chest therapy as tolerated.  Currently on room air  Acute metabolic and septic encephalopathy With waxing and waning mental status.  Very slow to respond and minimally verbal.   Persistent A-fib with RVR Rate controlled, on amiodarone.  On heparin drip.  Plan for Xarelto when p.o. able.   Acute systolic heart failure in a patient with history of chronic diastolic heart failure,  2D echocardiogram showing LV EF 45%.  Plan for GDMT but deficient oral intake.   AKI Resolved.  Latest creatinine of 1.1.   Hypernatremia Continue free water flushes.  Sodium level today at 147.   Diabetes mellitus type 2 POC glucose controlled.  Recent hemoglobin A1c 6.5.  Continue sliding scale insulin, Accu-Cheks, cortrak tube tube feeding   Anemia of chronic disease Anemia due to critical illness Patient received PRBC transfusion on 03/08/2023 for hemoglobin of 6.6 and received 1 more unit on 03/16/2023 for hemoglobin of 7.0..  Latest hemoglobin at 7.9.    Generalized deconditioning Baseline debility with Wheelchair-bound status. Palliative care on board, full code.  Overall poor prognosis with poor chances of recovery and meaningful rehab.  Will need ongoing goals of care discussion.   Moderate malnutrition  Nutrition Problem: Moderate Malnutrition Etiology: chronic illness Signs/Symptoms: mild fat depletion, severe muscle depletion Interventions: Refer to RD note for recommendations  Body mass index is 34.72 kg/m.  Patient was on TPN, discontinued on 03/09/2023 and currently on cortrak tube tube feeding.[name] Kitchen Speech follow-up on 03/16/2023 with unable to safely swallow and  recommend keep NPO.  Deconditioning debility.  Patient has been seen benefit PT and  recommend inpatient rehab.    DVT prophylaxis: Heparin drip  Code Status: Full  Family Communication: None at bedside today.  Disposition Plan: Likely to CIR as per PT evaluation. Status is: Inpatient  Remains inpatient appropriate because: Of severity of illness, pending clinical improvement, multiple comorbidities and profound weakness   Subjective:  Today, patient was seen and examined at bedside.  Patient is alert awake but minimally interactive.  Was able to move his leg on command but did not verbalize   Physical examination:  Body mass index is 34.72 kg/m.   General: Obese built, not in obvious distress, appears frail and debilitated, elderly male, cortrak tube tube in place. HENT:   No scleral pallor or icterus noted. Oral mucosa is moist.  Chest:  .  Diminished breath sounds bilaterally.  Coarse breath sounds noted CVS: S1 &S2 heard. No murmur.  Regular rate and rhythm. Abdomen: Soft, midline surgical incision with wound VAC, tenderness over the incision site bowel sounds are heard.   Extremities: No cyanosis, clubbing with trace edema, right upper extremity PICC line in place, bilateral upper extremity on mittens. Psych: Alert, awake and minimally interactive today, moves extremities, CNS:  No cranial nerve deficits.  Generalized weakness noted. Skin: Warm and dry.  Midline abdominal incision with dressing    Objective: Vitals:   03/16/23 1549 03/16/23 1950 03/16/23 2116 03/17/23 0808  BP: 120/81 114/81  127/81  Pulse: 70 66 73 71  Resp: 16 16 18 19   Temp: (!) 96.8 F (36 C) 97.6 F (36.4 C)  97.6 F (36.4 C)  TempSrc: Axillary Oral  Oral  SpO2: 94% 99% 92% 95%  Weight:      Height:        Intake/Output Summary (Last 24 hours) at 03/17/2023 0841 Last data filed at 03/17/2023 0612 Gross per 24 hour  Intake 4355.33 ml  Output 1500 ml  Net 2855.33 ml   Filed Weights   03/14/23 0451 03/15/23 0500 03/16/23 0409  Weight: 110.5 kg 106.7 kg 106.7 kg     Scheduled Meds:  acetaminophen  1,000 mg Oral TID   amiodarone  200 mg Oral BID   Chlorhexidine Gluconate Cloth  6 each Topical Daily   feeding supplement (PROSource TF20)  60 mL Per Tube Daily   fiber supplement (BANATROL TF)  60 mL Per Tube BID   free water  300 mL Per Tube Q4H   ipratropium  0.5 mg Nebulization BID   levalbuterol  1.25 mg Nebulization BID   loperamide HCl  2 mg Oral BID   nutrition supplement (JUVEN)  1 packet Per Tube BID BM   mouth rinse  15 mL Mouth Rinse Q2H   scopolamine  1 patch Transdermal Q72H   sodium chloride flush  10-40 mL Intracatheter Q12H   Continuous Infusions:  sodium chloride Stopped (03/14/23 2139)   sodium chloride Stopped (03/14/23 2140)   feeding supplement (OSMOLITE 1.5 CAL) 50 mL/hr at 03/16/23 0542   heparin 1,350 Units/hr (03/17/23 0612)    Nutritional status Signs/Symptoms: mild fat depletion, severe muscle depletion Interventions: Refer to RD note for recommendations Body mass index is 34.72 kg/m.  Data Reviewed:   CBC: Recent Labs  Lab 03/12/23 0430 03/13/23 0655 03/14/23 0620 03/15/23 0404 03/16/23 0410 03/16/23 1830  WBC 4.5 4.3 4.6 3.8* 3.3*  --   HGB 7.4* 7.4* 7.3* 7.2* 7.0* 7.9*  HCT 24.0* 24.3* 24.3* 23.2* 22.9* 26.1*  MCV 99.2 97.6 99.2 99.6 100.4*  --   PLT 240 251 245 219 202  --    Basic Metabolic Panel: Recent Labs  Lab 03/12/23 0430 03/14/23 0620 03/15/23 0404 03/16/23 0410 03/17/23 0606  NA 150* 148* 152* 149* 147*  K 4.3 4.1 4.1 3.8 4.3  CL 116* 113* 112* 115* 112*  CO2 28 30 30 29 29   GLUCOSE 87 120* 131* 133* 155*  BUN 70* 60* 57* 52* 48*  CREATININE 1.06 1.15 1.27* 1.17 1.12  CALCIUM 9.2 9.1 9.4 8.9 8.8*  MG  --  2.1 2.1 2.1 2.0  PHOS  --   --  3.8  --   --    GFR: Estimated Creatinine Clearance: 49.6 mL/min (by C-G formula based on SCr of 1.12 mg/dL). Liver Function Tests: Recent Labs  Lab 03/14/23 0620  AST 20  ALT 21  ALKPHOS 57  BILITOT 0.5  PROT 6.4*  ALBUMIN 2.4*    No results for input(s): "LIPASE", "AMYLASE" in the last 168 hours. No results for input(s): "AMMONIA" in the last 168 hours. Coagulation Profile: Recent Labs  Lab 03/15/23 0404  INR 1.4*   Cardiac Enzymes: No results for input(s): "CKTOTAL", "CKMB", "CKMBINDEX", "TROPONINI" in the last 168 hours. BNP (last 3 results) No results for input(s): "PROBNP" in the last 8760 hours. HbA1C: No results for input(s): "HGBA1C" in the last 72 hours. CBG: Recent Labs  Lab 03/16/23 1555 03/16/23 2032 03/17/23 0017 03/17/23 0412 03/17/23 0811  GLUCAP 113* 102* 108* 110* 127*   Lipid Profile: No results for input(s): "CHOL", "HDL", "LDLCALC", "TRIG", "CHOLHDL", "LDLDIRECT" in the last 72 hours. Thyroid Function Tests: No results for input(s): "TSH", "T4TOTAL", "FREET4", "T3FREE", "THYROIDAB" in the last 72 hours. Anemia Panel: No results for input(s): "VITAMINB12", "FOLATE", "FERRITIN", "TIBC", "IRON", "RETICCTPCT" in the last 72 hours. Sepsis Labs: Recent Labs  Lab 03/14/23 0620  PROCALCITON <0.10    Recent Results (from the past 240 hour(s))  Culture, blood (Routine X 2) w Reflex to ID Panel     Status: None (Preliminary result)   Collection Time: 03/14/23  9:40 AM   Specimen: BLOOD LEFT HAND  Result Value Ref Range Status   Specimen Description BLOOD LEFT HAND  Final   Special Requests   Final    BOTTLES DRAWN AEROBIC AND ANAEROBIC Blood Culture results may not be optimal due to an inadequate volume of blood received in culture bottles   Culture   Final    NO GROWTH 2 DAYS Performed at [facility], [address] [place], [place] [address]    Report Status PENDING  Incomplete  Culture, blood (Routine X 2) w Reflex to ID Panel     Status: None (Preliminary result)   Collection Time: 03/14/23  9:40 AM   Specimen: BLOOD LEFT HAND  Result Value Ref Range Status   Specimen Description BLOOD LEFT HAND  Final   Special Requests   Final    AEROBIC BOTTLE ONLY Blood  Culture results may not be optimal due to an inadequate volume of blood received in culture bottles   Culture   Final    NO GROWTH 2 DAYS Performed at [facility], [address], [place], [place] [address]    Report Status PENDING  Incomplete    Radiology Studies: DG Swallowing Func-Speech Pathology  Result Date: 03/16/2023 Table formatting from the original result was not included. Modified Barium Swallow Study Patient Details Name: Stephen Flynn MRN: [id number] Date of Birth:  1928-04-19 Today's Date: 03/16/2023 HPI/PMH: HPI: 89 y/o male admitted 03/12/2023 with abdominal pain, SOB with bowel obstruction. 8/26 developed hypotension and required ex-lap with small bowel resection, remained intubated post op. Extubated 8/27. Reintubated 9/1-9/6. 9/9 with R head turn and decr cognition noted from therapist but MRI negative. PMhx: GIB, stroke, HTN, Afib, neuropathy, CHF, obesity Clinical Impression: Clinical Impression: Pt's oropharyngeal swallow is very disorganized, with reduced bolus cohesion and repetitive lingual movements as boluses spill back toward his pyriform sinuses. Oral residue spills to his valleculae while he is try to get another bolus into his mouth. Pt has relatively improved pharyngeal function although with reduced anterior hyoid movement and near absent epiglottic deflection (question impact of cortrak). What becomes more concerning, is the retrograde flow that occurs back into the pharynx across the study. There appears to be an area in his cervical esophagus at which the barium collects (question outpouching but difficult to visualize around shadow from shoulder), and in between swallows it comes back up and fills his pyriform sinuses. Once his pyriform sinuses are full, as liquids spill posteriorly before the swallow, they end up being diverted into his airway and aspirated (PAS 7). Could consider starting with nectar thick liquids only, as he did not aspirate these even when backflow was  noted, although there is likely to be risk across a meal with any consistency when considering not just backflow but also mentation and positioning. Discussed findings with MD. Given current POC, he recommends that pt remain NPO for now. Factors that may increase risk of adverse event in presence of aspiration Rubye Oaks & Clearance Coots 2021): Factors that may increase risk of adverse event in presence of aspiration Rubye Oaks & Clearance Coots 2021): Poor general health and/or compromised immunity; Respiratory or GI disease; Reduced cognitive function; Limited mobility; Frail or deconditioned; Dependence for feeding and/or oral hygiene; Presence of tubes (ETT, trach, NG, etc.) Recommendations/Plan: Swallowing Evaluation Recommendations Swallowing Evaluation Recommendations Recommendations: NPO Medication Administration: Via alternative means Treatment Plan Treatment Plan Treatment recommendations: Therapy as outlined in treatment plan below Follow-up recommendations: Skilled nursing-short term rehab (<3 hours/day) Functional status assessment: Patient has had a recent decline in their functional status and/or demonstrates limited ability to make significant improvements in function in a reasonable and predictable amount of time. Treatment frequency: Min 2x/week Treatment duration: 2 weeks Interventions: Aspiration precaution training; Trials of upgraded texture/liquids; Patient/family education Recommendations Recommendations for follow up therapy are one component of a multi-disciplinary discharge planning process, led by the attending physician.  Recommendations may be updated based on patient status, additional functional criteria and insurance authorization. Assessment: Orofacial Exam: Orofacial Exam Oral Cavity - Dentition: Missing dentition Anatomy: Anatomy: Other (Comment) (see clinical impressions) Boluses Administered: Boluses Administered Boluses Administered: Thin liquids (Level 0); Mildly thick liquids (Level 2, nectar  thick); Moderately thick liquids (Level 3, honey thick); Puree; Solid  Oral Impairment Domain: Oral Impairment Domain Lip Closure: Escape progressing to mid-chin Tongue control during bolus hold: Posterior escape of greater than half of bolus Bolus preparation/mastication: Slow prolonged chewing/mashing with complete recollection Bolus transport/lingual motion: Repetitive/disorganized tongue motion Oral residue: Residue collection on oral structures Location of oral residue : Tongue; Palate Initiation of pharyngeal swallow : Pyriform sinuses  Pharyngeal Impairment Domain: Pharyngeal Impairment Domain Soft palate elevation: Trace column of contrast or air between SP and PW (difficult to see how far it escapes to) Laryngeal elevation: Complete superior movement of thyroid cartilage with complete approximation of arytenoids to epiglottic petiole Anterior hyoid excursion: Partial anterior movement Epiglottic movement:  No inversion Laryngeal vestibule closure: Complete, no air/contrast in laryngeal vestibule Pharyngeal stripping wave : Present - complete Pharyngeal contraction (A/P view only): N/A Pharyngoesophageal segment opening: Partial distention/partial duration, partial obstruction of flow Tongue base retraction: No contrast between tongue base and posterior pharyngeal wall (PPW) Pharyngeal residue: Collection of residue within or on pharyngeal structures Location of pharyngeal residue: Pyriform sinuses  Esophageal Impairment Domain: Esophageal Impairment Domain Esophageal clearance upright position: Esophageal retention with retrograde flow through the PES Pill: No data recorded Penetration/Aspiration Scale Score: Penetration/Aspiration Scale Score 1.  Material does not enter airway: Mildly thick liquids (Level 2, nectar thick); Moderately thick liquids (Level 3, honey thick); Puree; Solid 2.  Material enters airway, remains ABOVE vocal cords then ejected out: Solid 7.  Material enters airway, passes BELOW cords  and not ejected out despite cough attempt by patient: Thin liquids (Level 0) Compensatory Strategies: No data recorded  General Information: Caregiver present: No  Diet Prior to this Study: NPO; Cortrak/Small bore NG tube   Temperature : Normal   Respiratory Status: WFL   Supplemental O2: Nasal cannula   History of Recent Intubation: Yes  Behavior/Cognition: Alert; Cooperative; Pleasant mood; Confused; Requires cueing Self-Feeding Abilities: Dependent for feeding Baseline vocal quality/speech: Normal No data recorded Volitional Swallow: Able to elicit Exam Limitations: Limited visibility Goal Planning: Prognosis for improved oropharyngeal function: Fair Barriers to Reach Goals: Cognitive deficits; Other (Comment) (suspect underlying esophageal component) No data recorded Patient/Family Stated Goal: none stated Consulted and agree with results and recommendations: Pt unable/family or caregiver not available; Physician Pain: Pain Assessment Pain Assessment: Faces Faces Pain Scale: 0 Pain Location: RLE with movement Pain Descriptors / Indicators: Discomfort; Sore; Grimacing; Moaning Pain Intervention(s): Limited activity within patient's tolerance; Monitored during session; Repositioned End of Session: Start Time:SLP Start Time (ACUTE ONLY): 1011 Stop Time: SLP Stop Time (ACUTE ONLY): 1029 Time Calculation:SLP Time Calculation (min) (ACUTE ONLY): 18 min Charges: SLP Evaluations $ SLP Speech Visit: 1 Visit SLP Evaluations $BSS Swallow: 1 Procedure $MBS Swallow: 1 Procedure SLP visit diagnosis: SLP Visit Diagnosis: Dysphagia, pharyngoesophageal phase (R13.14); Dysphagia, oropharyngeal phase (R13.12) Past Medical History: Past Medical History: Diagnosis Date  Anemia   Aortic insufficiency   Chest pain   H/O: GI bleed   Hyperlipidemia   Hypertension   Leg cramps   Stroke [facility])  Past Surgical History: Past Surgical History: Procedure Laterality Date  BOWEL RESECTION N/A 2023-03-01  Procedure: SMALL BOWEL RESECTION;  Surgeon:  Harriette Bouillon, MD;  Location: [facility] OR;  Service: General;  Laterality: N/A;  CARDIOVASCULAR STRESS TEST  09/04/2005  LAPAROTOMY N/A 01-Mar-2023  Procedure: EXPLORATORY LAPAROTOMY;  Surgeon: Harriette Bouillon, MD;  Location: [facility] OR;  Service: General;  Laterality: N/A;  US ECHOCARDIOGRAPHY  11/26/2007  ef 55-60% Mahala Menghini., M.A. CCC-SLP Acute Rehabilitation Services Office [phone] Secure chat preferred 03/16/2023, 11:51 AM     LOS: 20 days    Joycelyn Das, MD [facility] 03/17/2023, 8:41 AM

## 2023-03-17 NOTE — Plan of Care (Signed)

## 2023-03-17 NOTE — Progress Notes (Signed)
ANTICOAGULATION CONSULT NOTE-Follow Up  Pharmacy Consult for Heparin Indication: atrial fibrillation  Allergies  Allergen Reactions   Penicillin G     Other reaction(s): Drowsy   Penicillins Hives    Did it involve swelling of the face/tongue/throat, SOB, or low BP? No Did it involve sudden or severe rash/hives, skin peeling, or any reaction on the inside of your mouth or nose? yes Did you need to seek medical attention at a hospital or doctor's office? yes When did it last happen?  child     If all above answers are "NO", may proceed with cephalosporin use.    Patient Measurements: Height: 5' 9.02" (175.3 cm) Weight: 106.7 kg (235 lb 3.7 oz) IBW/kg (Calculated) : 70.74 Heparin Dosing Weight: 95 kg  Vital Signs: Temp: 97.6 F (36.4 C) (09/14 0808) Temp Source: Oral (09/14 0808) BP: 127/81 (09/14 0808) Pulse Rate: 67 (09/14 0854)  Labs: Recent Labs    03/15/23 0404 03/15/23 1430 03/16/23 0410 03/16/23 1315 03/16/23 1830 03/17/23 0148 03/17/23 0606  HGB 7.2*  --  7.0*  --  7.9*  --   --   HCT 23.2*  --  22.9*  --  26.1*  --   --   PLT 219  --  202  --   --   --   --   LABPROT 17.0*  --   --   --   --   --   --   INR 1.4*  --   --   --   --   --   --   HEPARINUNFRC 0.11*   < > 0.14* 0.19*  --  0.32 0.34  CREATININE 1.27*  --  1.17  --   --   --  1.12   < > = values in this interval not displayed.    Estimated Creatinine Clearance: 49.6 mL/min (by C-G formula based on SCr of 1.12 mg/dL).   Assessment: 89 YOM s/p SBR on Xarelto prior to admission for atrial fibrillation. Plans to D/C wound VAC on Monday (9/16).  Confirmatory heparin level therapeutic (0.34) on infusion at 1350 units/hr. No issues with line or bleeding reported per RN. No new CBC since last pharmacy note. Appears plan per cards is to transition to oral [facility] on discharge.  Goal of Therapy:  Heparin level 0.3-0.5 units/ml Monitor platelets by anticoagulation protocol: Yes   Plan:  Continue heparin  at 1350 units / hr Daily heparin level and CBC F/u plans to transition to oral [facility]  Nicole Kindred, PharmD PGY1 Pharmacy Resident 03/17/2023 10:27 AM

## 2023-03-17 NOTE — Progress Notes (Signed)
ANTICOAGULATION CONSULT NOTE-Follow Up  Pharmacy Consult for Heparin Indication: atrial fibrillation  Allergies  Allergen Reactions   Penicillin G     Other reaction(s): Drowsy   Penicillins Hives    Did it involve swelling of the face/tongue/throat, SOB, or low BP? No Did it involve sudden or severe rash/hives, skin peeling, or any reaction on the inside of your mouth or nose? yes Did you need to seek medical attention at a hospital or doctor's office? yes When did it last happen?  child     If all above answers are "NO", may proceed with cephalosporin use.    Patient Measurements: Height: 5' 9.02" (175.3 cm) Weight: 106.7 kg (235 lb 3.7 oz) IBW/kg (Calculated) : 70.74 Heparin Dosing Weight: 95 kg  Vital Signs: Temp: 97.6 F (36.4 C) (09/13 1950) Temp Source: Oral (09/13 1950) BP: 114/81 (09/13 1950) Pulse Rate: 73 (09/13 2116)  Labs: Recent Labs    03/14/23 0620 03/14/23 1759 03/15/23 0404 03/15/23 1430 03/16/23 0410 03/16/23 1315 03/16/23 1830 03/17/23 0148  HGB 7.3*  --  7.2*  --  7.0*  --  7.9*  --   HCT 24.3*  --  23.2*  --  22.9*  --  26.1*  --   PLT 245  --  219  --  202  --   --   --   LABPROT  --   --  17.0*  --   --   --   --   --   INR  --   --  1.4*  --   --   --   --   --   HEPARINUNFRC 0.20*   < > 0.11*   < > 0.14* 0.19*  --  0.32  CREATININE 1.15  --  1.27*  --  1.17  --   --   --    < > = values in this interval not displayed.    Estimated Creatinine Clearance: 47.5 mL/min (by C-G formula based on SCr of 1.17 mg/dL).   Assessment: 89 YOM s/p SBR on Eliqius for atrial fibrillation. Tracheostomy planned 9/9 - plan to hold Eliquis and start heparin until trach completed.  Heparin level now therapeutic (0.32) on infusion at 1350 units/hr. No issues with line or bleeding reported per RN. (Wonder if 0.61 heparin level 9/12 afternoon was inaccurate)  Goal of Therapy:  Heparin level 0.3-0.5 units/ml aPTT 66-85 seconds Monitor platelets by  anticoagulation protocol: Yes   Plan:  Continue heparin at 1350 units / hr Daily heparin level and CBC - will retime for 0800  Christoper Fabian, PharmD, BCPS Please see amion for complete clinical pharmacist phone list  03/17/2023 2:22 AM

## 2023-03-17 NOTE — Progress Notes (Signed)
19 Days Post-Op   Subjective/Chief Complaint: NAEO. Tolerating TF. Per RN patient is having continuous loose stools.  Cardiology at bedside, planning for diuresis. TRH at bedside, planning to give 1 u p RBC for hgb 7.0 today   Objective: Vital signs in last 24 hours: Temp:  [96.8 F (36 C)-97.7 F (36.5 C)] 97.6 F (36.4 C) (09/14 0808) Pulse Rate:  [66-77] 67 (09/14 0854) Resp:  [16-20] 20 (09/14 0854) BP: (96-127)/(59-84) 127/81 (09/14 0808) SpO2:  [92 %-99 %] 97 % (09/14 0854) FiO2 (%):  [21 %] 21 % (09/14 0854) Last BM Date : 03/15/23  Intake/Output from previous day: 09/13 0701 - 09/14 0700 In: 4355.3 [I.V.:311.3; ZOXWR:6045; NG/GT:1800] Out: 1500 [Urine:1300; Stool:200] Intake/Output this shift: No intake/output data recorded.  Alert, NAD Confused with some disorganized speech. Soft restraints in place.  Cortrak in place, tube feeds running at 55 mL /hr Abdomen soft and nondistended. Wound vac in place on midline wound, serosanguinous fluid in canister.    Lab Results:  Recent Labs    03/15/23 0404 03/16/23 0410 03/16/23 1830  WBC 3.8* 3.3*  --   HGB 7.2* 7.0* 7.9*  HCT 23.2* 22.9* 26.1*  PLT 219 202  --    BMET Recent Labs    03/16/23 0410 03/17/23 0606  NA 149* 147*  K 3.8 4.3  CL 115* 112*  CO2 29 29  GLUCOSE 133* 155*  BUN 52* 48*  CREATININE 1.17 1.12  CALCIUM 8.9 8.8*   PT/INR Recent Labs    03/15/23 0404  LABPROT 17.0*  INR 1.4*   ABG No results for input(s): "PHART", "HCO3" in the last 72 hours.  Invalid input(s): "PCO2", "PO2"  Studies/Results: DG Swallowing Func-Speech Pathology  Result Date: 03/16/2023 Table formatting from the original result was not included. Modified Barium Swallow Study Patient Details Name: Chadwick Kostrzewski MRN: [id number] Date of Birth: [birth year] Today's Date: 03/16/2023 HPI/PMH: HPI: 89 y/o male admitted 8/25 with abdominal pain, SOB with bowel obstruction. Mar 20, 2023 developed hypotension and required ex-lap with  small bowel resection, remained intubated post op. Extubated 8/27. Reintubated 9/1-9/6. 9/9 with R head turn and decr cognition noted from therapist but MRI negative. PMhx: GIB, stroke, HTN, Afib, neuropathy, CHF, obesity Clinical Impression: Clinical Impression: Pt's oropharyngeal swallow is very disorganized, with reduced bolus cohesion and repetitive lingual movements as boluses spill back toward his pyriform sinuses. Oral residue spills to his valleculae while he is try to get another bolus into his mouth. Pt has relatively improved pharyngeal function although with reduced anterior hyoid movement and near absent epiglottic deflection (question impact of cortrak). What becomes more concerning, is the retrograde flow that occurs back into the pharynx across the study. There appears to be an area in his cervical esophagus at which the barium collects (question outpouching but difficult to visualize around shadow from shoulder), and in between swallows it comes back up and fills his pyriform sinuses. Once his pyriform sinuses are full, as liquids spill posteriorly before the swallow, they end up being diverted into his airway and aspirated (PAS 7). Could consider starting with nectar thick liquids only, as he did not aspirate these even when backflow was noted, although there is likely to be risk across a meal with any consistency when considering not just backflow but also mentation and positioning. Discussed findings with MD. Given current POC, he recommends that pt remain NPO for now. Factors that may increase risk of adverse event in presence of aspiration Rubye Oaks & Clearance Coots 2021): Factors that may  increase risk of adverse event in presence of aspiration Rubye Oaks & Clearance Coots 2021): Poor general health and/or compromised immunity; Respiratory or GI disease; Reduced cognitive function; Limited mobility; Frail or deconditioned; Dependence for feeding and/or oral hygiene; Presence of tubes (ETT, trach, NG, etc.)  Recommendations/Plan: Swallowing Evaluation Recommendations Swallowing Evaluation Recommendations Recommendations: NPO Medication Administration: Via alternative means Treatment Plan Treatment Plan Treatment recommendations: Therapy as outlined in treatment plan below Follow-up recommendations: Skilled nursing-short term rehab (<3 hours/day) Functional status assessment: Patient has had a recent decline in their functional status and/or demonstrates limited ability to make significant improvements in function in a reasonable and predictable amount of time. Treatment frequency: Min 2x/week Treatment duration: 2 weeks Interventions: Aspiration precaution training; Trials of upgraded texture/liquids; Patient/family education Recommendations Recommendations for follow up therapy are one component of a multi-disciplinary discharge planning process, led by the attending physician.  Recommendations may be updated based on patient status, additional functional criteria and insurance authorization. Assessment: Orofacial Exam: Orofacial Exam Oral Cavity - Dentition: Missing dentition Anatomy: Anatomy: Other (Comment) (see clinical impressions) Boluses Administered: Boluses Administered Boluses Administered: Thin liquids (Level 0); Mildly thick liquids (Level 2, nectar thick); Moderately thick liquids (Level 3, honey thick); Puree; Solid  Oral Impairment Domain: Oral Impairment Domain Lip Closure: Escape progressing to mid-chin Tongue control during bolus hold: Posterior escape of greater than half of bolus Bolus preparation/mastication: Slow prolonged chewing/mashing with complete recollection Bolus transport/lingual motion: Repetitive/disorganized tongue motion Oral residue: Residue collection on oral structures Location of oral residue : Tongue; Palate Initiation of pharyngeal swallow : Pyriform sinuses  Pharyngeal Impairment Domain: Pharyngeal Impairment Domain Soft palate elevation: Trace column of contrast or air between  SP and PW (difficult to see how far it escapes to) Laryngeal elevation: Complete superior movement of thyroid cartilage with complete approximation of arytenoids to epiglottic petiole Anterior hyoid excursion: Partial anterior movement Epiglottic movement: No inversion Laryngeal vestibule closure: Complete, no air/contrast in laryngeal vestibule Pharyngeal stripping wave : Present - complete Pharyngeal contraction (A/P view only): N/A Pharyngoesophageal segment opening: Partial distention/partial duration, partial obstruction of flow Tongue base retraction: No contrast between tongue base and posterior pharyngeal wall (PPW) Pharyngeal residue: Collection of residue within or on pharyngeal structures Location of pharyngeal residue: Pyriform sinuses  Esophageal Impairment Domain: Esophageal Impairment Domain Esophageal clearance upright position: Esophageal retention with retrograde flow through the PES Pill: No data recorded Penetration/Aspiration Scale Score: Penetration/Aspiration Scale Score 1.  Material does not enter airway: Mildly thick liquids (Level 2, nectar thick); Moderately thick liquids (Level 3, honey thick); Puree; Solid 2.  Material enters airway, remains ABOVE vocal cords then ejected out: Solid 7.  Material enters airway, passes BELOW cords and not ejected out despite cough attempt by patient: Thin liquids (Level 0) Compensatory Strategies: No data recorded  General Information: Caregiver present: No  Diet Prior to this Study: NPO; Cortrak/Small bore NG tube   Temperature : Normal   Respiratory Status: WFL   Supplemental O2: Nasal cannula   History of Recent Intubation: Yes  Behavior/Cognition: Alert; Cooperative; Pleasant mood; Confused; Requires cueing Self-Feeding Abilities: Dependent for feeding Baseline vocal quality/speech: Normal No data recorded Volitional Swallow: Able to elicit Exam Limitations: Limited visibility Goal Planning: Prognosis for improved oropharyngeal function: Fair Barriers  to Reach Goals: Cognitive deficits; Other (Comment) (suspect underlying esophageal component) No data recorded Patient/Family Stated Goal: none stated Consulted and agree with results and recommendations: Pt unable/family or caregiver not available; Physician Pain: Pain Assessment Pain Assessment: Faces Faces Pain Scale: 0 Pain  Location: RLE with movement Pain Descriptors / Indicators: Discomfort; Sore; Grimacing; Moaning Pain Intervention(s): Limited activity within patient's tolerance; Monitored during session; Repositioned End of Session: Start Time:SLP Start Time (ACUTE ONLY): 1011 Stop Time: SLP Stop Time (ACUTE ONLY): 1029 Time Calculation:SLP Time Calculation (min) (ACUTE ONLY): 18 min Charges: SLP Evaluations $ SLP Speech Visit: 1 Visit SLP Evaluations $BSS Swallow: 1 Procedure $MBS Swallow: 1 Procedure SLP visit diagnosis: SLP Visit Diagnosis: Dysphagia, pharyngoesophageal phase (R13.14); Dysphagia, oropharyngeal phase (R13.12) Past Medical History: Past Medical History: Diagnosis Date  Anemia   Aortic insufficiency   Chest pain   H/O: GI bleed   Hyperlipidemia   Hypertension   Leg cramps   Stroke [facility])  Past Surgical History: Past Surgical History: Procedure Laterality Date  BOWEL RESECTION N/A 2023/03/20  Procedure: SMALL BOWEL RESECTION;  Surgeon: Harriette Bouillon, MD;  Location: [facility] OR;  Service: General;  Laterality: N/A;  CARDIOVASCULAR STRESS TEST  09/04/2005  LAPAROTOMY N/A 03/20/2023  Procedure: EXPLORATORY LAPAROTOMY;  Surgeon: Harriette Bouillon, MD;  Location: [facility] OR;  Service: General;  Laterality: N/A;  US ECHOCARDIOGRAPHY  11/26/2007  ef 55-60% Mahala Menghini., M.A. CCC-SLP Acute Rehabilitation Services Office [phone] Secure chat preferred 03/16/2023, 11:51 AM   Anti-infectives: Anti-infectives (From admission, onward)    Start     Dose/Rate Route Frequency Ordered Stop   03/14/23 0700  cefTRIAXone (ROCEPHIN) 1 g in sodium chloride 0.9 % 100 mL IVPB  Status:  Discontinued        1 g 200 mL/hr over  30 Minutes Intravenous Daily 03/14/23 0621 03/14/23 0759   03/14/23 0630  azithromycin (ZITHROMAX) 500 mg in sodium chloride 0.9 % 250 mL IVPB  Status:  Discontinued        500 mg 250 mL/hr over 60 Minutes Intravenous Daily 03/14/23 0621 03/14/23 0759   03/04/23 1000  meropenem (MERREM) 1 g in sodium chloride 0.9 % 100 mL IVPB        1 g 200 mL/hr over 30 Minutes Intravenous Every 12 hours 03/04/23 0855 03/10/23 2132   03/01/23 1500  ceFEPIme (MAXIPIME) 2 g in sodium chloride 0.9 % 100 mL IVPB        2 g 200 mL/hr over 30 Minutes Intravenous Every 12 hours 03/01/23 1124 03/02/23 0356   03/01/23 0300  ceFEPIme (MAXIPIME) 2 g in sodium chloride 0.9 % 100 mL IVPB  Status:  Discontinued        2 g 200 mL/hr over 30 Minutes Intravenous Every 24 hours 02/28/23 1141 03/01/23 1124   March 20, 2023 1445  piperacillin-tazobactam (ZOSYN) IVPB 3.375 g  Status:  Discontinued        3.375 g 100 mL/hr over 30 Minutes Intravenous Every 8 hours 2023/03/20 1436 Mar 20, 2023 1445   2023-03-20 1445  ceFEPIme (MAXIPIME) 2 g in sodium chloride 0.9 % 100 mL IVPB  Status:  Discontinued        2 g 200 mL/hr over 30 Minutes Intravenous Every 12 hours 03-20-2023 1446 02/28/23 1141   20-Mar-2023 1445  metroNIDAZOLE (FLAGYL) IVPB 500 mg        500 mg 100 mL/hr over 60 Minutes Intravenous Every 12 hours 03/20/2023 1446 03/02/23 1719   03-20-2023 1015  meropenem (MERREM) 1 g in sodium chloride 0.9 % 100 mL IVPB        1 g 200 mL/hr over 30 Minutes Intravenous  Once March 20, 2023 0917 03-20-2023 1743       Assessment/Plan: -POD#19 s/p Exploratory laparotomy small bowel resection and primary anastomosis March 20, 2023 Dr. Luisa Hart -  approximately 120 cm of small bowel remaining. He has started having diarrhea - Banatrol 60 mL BID. Imodium 2 mg BID today per tube and titrate up as needed.  - appreciate SLP eval, swallow yesterday failed, NPO rec.  Appears to be having some delirium.  - abdominal wound vac MWF >> likely D/C VAC on Monday    Maudry Diego, MD, FACS, FSSO Surgical Oncology, General Surgery, Trauma and Critical [facility], [place] [phone] for weekday/non holidays Check [email or web address] for coverage night/weekend/holidays

## 2023-03-17 NOTE — Progress Notes (Signed)
Progress Note  Patient Name: Stephen Flynn Date of Encounter: 03/17/2023  Primary Cardiologist: None   Subjective   Patient seen and examined at her bedside.  Inpatient Medications    Scheduled Meds:  acetaminophen  1,000 mg Oral TID   amiodarone  200 mg Oral BID   Chlorhexidine Gluconate Cloth  6 each Topical Daily   feeding supplement (PROSource TF20)  60 mL Per Tube Daily   fiber supplement (BANATROL TF)  60 mL Per Tube BID   free water  300 mL Per Tube Q4H   ipratropium  0.5 mg Nebulization BID   levalbuterol  1.25 mg Nebulization BID   loperamide HCl  2 mg Oral BID   nutrition supplement (JUVEN)  1 packet Per Tube BID BM   mouth rinse  15 mL Mouth Rinse Q2H   scopolamine  1 patch Transdermal Q72H   sodium chloride flush  10-40 mL Intracatheter Q12H   Continuous Infusions:  sodium chloride Stopped (03/14/23 2139)   sodium chloride Stopped (03/14/23 2140)   feeding supplement (OSMOLITE 1.5 CAL) 50 mL/hr at 03/16/23 0542   heparin 1,350 Units/hr (03/17/23 0612)   PRN Meds: sodium chloride, sodium chloride, guaiFENesin, hydrALAZINE, HYDROmorphone (DILAUDID) injection, ipratropium-albuterol, metoprolol tartrate, ondansetron **OR** ondansetron (ZOFRAN) IV, mouth rinse, senna-docusate, sodium chloride flush, traZODone   Vital Signs    Vitals:   03/16/23 1950 03/16/23 2116 03/17/23 0808 03/17/23 0854  BP: 114/81  127/81   Pulse: 66 73 71 67  Resp: 16 18 19 20   Temp: 97.6 F (36.4 C)  97.6 F (36.4 C)   TempSrc: Oral  Oral   SpO2: 99% 92% 95% 97%  Weight:      Height:        Intake/Output Summary (Last 24 hours) at 03/17/2023 1114 Last data filed at 03/17/2023 0612 Gross per 24 hour  Intake 4355.33 ml  Output 1500 ml  Net 2855.33 ml   Filed Weights   03/14/23 0451 03/15/23 0500 03/16/23 0409  Weight: 110.5 kg 106.7 kg 106.7 kg    Telemetry    Atrial fibrillation with controlled ventricular rate  - Personally Reviewed  ECG    None today - Personally  Reviewed  Physical Exam    General: Comfortable Head: Atraumatic, normal size  Eyes: PEERLA, EOMI  Neck: Supple, normal JVD Cardiac: Normal S1, S2; RRR; no murmurs, rubs, or gallops Lungs: Clear to auscultation bilaterally Abd: Soft, nontender, no hepatomegaly  Ext: warm, no edema Musculoskeletal: No deformities, BUE and BLE strength normal and equal Skin: Warm and dry, no rashes   Neuro: Alert and oriented to person, place, time, and situation, CNII-XII grossly intact, no focal deficits  Psych: Normal mood and affect   Labs    Chemistry Recent Labs  Lab 03/14/23 0620 03/15/23 0404 03/16/23 0410 03/17/23 0606  NA 148* 152* 149* 147*  K 4.1 4.1 3.8 4.3  CL 113* 112* 115* 112*  CO2 30 30 29 29   GLUCOSE 120* 131* 133* 155*  BUN 60* 57* 52* 48*  CREATININE 1.15 1.27* 1.17 1.12  CALCIUM 9.1 9.4 8.9 8.8*  PROT 6.4*  --   --   --   ALBUMIN 2.4*  --   --   --   AST 20  --   --   --   ALT 21  --   --   --   ALKPHOS 57  --   --   --   BILITOT 0.5  --   --   --  GFRNONAA 59* 53* 58* >60  ANIONGAP 5 10 5 6      Hematology Recent Labs  Lab 03/14/23 0620 03/15/23 0404 03/16/23 0410 03/16/23 1830  WBC 4.6 3.8* 3.3*  --   RBC 2.45* 2.33* 2.28*  --   HGB 7.3* 7.2* 7.0* 7.9*  HCT 24.3* 23.2* 22.9* 26.1*  MCV 99.2 99.6 100.4*  --   MCH 29.8 30.9 30.7  --   MCHC 30.0 31.0 30.6  --   RDW 16.3* 16.4* 16.5*  --   PLT 245 219 202  --     Cardiac EnzymesNo results for input(s): "TROPONINI" in the last 168 hours. No results for input(s): "TROPIPOC" in the last 168 hours.   BNP Recent Labs  Lab 03/14/23 0620  BNP 563.9*     DDimer No results for input(s): "DDIMER" in the last 168 hours.   Radiology    DG Swallowing Func-Speech Pathology  Result Date: 03/16/2023 Table formatting from the original result was not included. Modified Barium Swallow Study Patient Details Name: Stephen Flynn MRN: [id number] Date of Birth: [birth year] Today's Date: 03/16/2023 HPI/PMH: HPI: 89 y/o  male admitted 8/25 with abdominal pain, SOB with bowel obstruction. 03-28-2023 developed hypotension and required ex-lap with small bowel resection, remained intubated post op. Extubated 8/27. Reintubated 9/1-9/6. 9/9 with R head turn and decr cognition noted from therapist but MRI negative. PMhx: GIB, stroke, HTN, Afib, neuropathy, CHF, obesity Clinical Impression: Clinical Impression: Pt's oropharyngeal swallow is very disorganized, with reduced bolus cohesion and repetitive lingual movements as boluses spill back toward his pyriform sinuses. Oral residue spills to his valleculae while he is try to get another bolus into his mouth. Pt has relatively improved pharyngeal function although with reduced anterior hyoid movement and near absent epiglottic deflection (question impact of cortrak). What becomes more concerning, is the retrograde flow that occurs back into the pharynx across the study. There appears to be an area in his cervical esophagus at which the barium collects (question outpouching but difficult to visualize around shadow from shoulder), and in between swallows it comes back up and fills his pyriform sinuses. Once his pyriform sinuses are full, as liquids spill posteriorly before the swallow, they end up being diverted into his airway and aspirated (PAS 7). Could consider starting with nectar thick liquids only, as he did not aspirate these even when backflow was noted, although there is likely to be risk across a meal with any consistency when considering not just backflow but also mentation and positioning. Discussed findings with MD. Given current POC, he recommends that pt remain NPO for now. Factors that may increase risk of adverse event in presence of aspiration Rubye Oaks & Clearance Coots 2021): Factors that may increase risk of adverse event in presence of aspiration Rubye Oaks & Clearance Coots 2021): Poor general health and/or compromised immunity; Respiratory or GI disease; Reduced cognitive function; Limited  mobility; Frail or deconditioned; Dependence for feeding and/or oral hygiene; Presence of tubes (ETT, trach, NG, etc.) Recommendations/Plan: Swallowing Evaluation Recommendations Swallowing Evaluation Recommendations Recommendations: NPO Medication Administration: Via alternative means Treatment Plan Treatment Plan Treatment recommendations: Therapy as outlined in treatment plan below Follow-up recommendations: Skilled nursing-short term rehab (<3 hours/day) Functional status assessment: Patient has had a recent decline in their functional status and/or demonstrates limited ability to make significant improvements in function in a reasonable and predictable amount of time. Treatment frequency: Min 2x/week Treatment duration: 2 weeks Interventions: Aspiration precaution training; Trials of upgraded texture/liquids; Patient/family education Recommendations Recommendations for follow up therapy are one  component of a multi-disciplinary discharge planning process, led by the attending physician.  Recommendations may be updated based on patient status, additional functional criteria and insurance authorization. Assessment: Orofacial Exam: Orofacial Exam Oral Cavity - Dentition: Missing dentition Anatomy: Anatomy: Other (Comment) (see clinical impressions) Boluses Administered: Boluses Administered Boluses Administered: Thin liquids (Level 0); Mildly thick liquids (Level 2, nectar thick); Moderately thick liquids (Level 3, honey thick); Puree; Solid  Oral Impairment Domain: Oral Impairment Domain Lip Closure: Escape progressing to mid-chin Tongue control during bolus hold: Posterior escape of greater than half of bolus Bolus preparation/mastication: Slow prolonged chewing/mashing with complete recollection Bolus transport/lingual motion: Repetitive/disorganized tongue motion Oral residue: Residue collection on oral structures Location of oral residue : Tongue; Palate Initiation of pharyngeal swallow : Pyriform sinuses   Pharyngeal Impairment Domain: Pharyngeal Impairment Domain Soft palate elevation: Trace column of contrast or air between SP and PW (difficult to see how far it escapes to) Laryngeal elevation: Complete superior movement of thyroid cartilage with complete approximation of arytenoids to epiglottic petiole Anterior hyoid excursion: Partial anterior movement Epiglottic movement: No inversion Laryngeal vestibule closure: Complete, no air/contrast in laryngeal vestibule Pharyngeal stripping wave : Present - complete Pharyngeal contraction (A/P view only): N/A Pharyngoesophageal segment opening: Partial distention/partial duration, partial obstruction of flow Tongue base retraction: No contrast between tongue base and posterior pharyngeal wall (PPW) Pharyngeal residue: Collection of residue within or on pharyngeal structures Location of pharyngeal residue: Pyriform sinuses  Esophageal Impairment Domain: Esophageal Impairment Domain Esophageal clearance upright position: Esophageal retention with retrograde flow through the PES Pill: No data recorded Penetration/Aspiration Scale Score: Penetration/Aspiration Scale Score 1.  Material does not enter airway: Mildly thick liquids (Level 2, nectar thick); Moderately thick liquids (Level 3, honey thick); Puree; Solid 2.  Material enters airway, remains ABOVE vocal cords then ejected out: Solid 7.  Material enters airway, passes BELOW cords and not ejected out despite cough attempt by patient: Thin liquids (Level 0) Compensatory Strategies: No data recorded  General Information: Caregiver present: No  Diet Prior to this Study: NPO; Cortrak/Small bore NG tube   Temperature : Normal   Respiratory Status: WFL   Supplemental O2: Nasal cannula   History of Recent Intubation: Yes  Behavior/Cognition: Alert; Cooperative; Pleasant mood; Confused; Requires cueing Self-Feeding Abilities: Dependent for feeding Baseline vocal quality/speech: Normal No data recorded Volitional Swallow: Able  to elicit Exam Limitations: Limited visibility Goal Planning: Prognosis for improved oropharyngeal function: Fair Barriers to Reach Goals: Cognitive deficits; Other (Comment) (suspect underlying esophageal component) No data recorded Patient/Family Stated Goal: none stated Consulted and agree with results and recommendations: Pt unable/family or caregiver not available; Physician Pain: Pain Assessment Pain Assessment: Faces Faces Pain Scale: 0 Pain Location: RLE with movement Pain Descriptors / Indicators: Discomfort; Sore; Grimacing; Moaning Pain Intervention(s): Limited activity within patient's tolerance; Monitored during session; Repositioned End of Session: Start Time:SLP Start Time (ACUTE ONLY): 1011 Stop Time: SLP Stop Time (ACUTE ONLY): 1029 Time Calculation:SLP Time Calculation (min) (ACUTE ONLY): 18 min Charges: SLP Evaluations $ SLP Speech Visit: 1 Visit SLP Evaluations $BSS Swallow: 1 Procedure $MBS Swallow: 1 Procedure SLP visit diagnosis: SLP Visit Diagnosis: Dysphagia, pharyngoesophageal phase (R13.14); Dysphagia, oropharyngeal phase (R13.12) Past Medical History: Past Medical History: Diagnosis Date  Anemia   Aortic insufficiency   Chest pain   H/O: GI bleed   Hyperlipidemia   Hypertension   Leg cramps   Stroke [facility])  Past Surgical History: Past Surgical History: Procedure Laterality Date  BOWEL RESECTION N/A 03/01/2023  Procedure:  SMALL BOWEL RESECTION;  Surgeon: Harriette Bouillon, MD;  Location: [facility] OR;  Service: General;  Laterality: N/A;  CARDIOVASCULAR STRESS TEST  09/04/2005  LAPAROTOMY N/A 02/07/2023  Procedure: EXPLORATORY LAPAROTOMY;  Surgeon: Harriette Bouillon, MD;  Location: [facility] OR;  Service: General;  Laterality: N/A;  US ECHOCARDIOGRAPHY  11/26/2007  ef 55-60% Mahala Menghini., M.A. CCC-SLP Acute Rehabilitation Services Office [phone] Secure chat preferred 03/16/2023, 11:51 AM   Cardiac Studies   Reviewed echocardiogram from February 26, 2023  Patient Profile     89 y.o. male with permanent  atrial fibrillation and newly diagnosed heart failure cardiomyopathy  Assessment & Plan    Atrial fibrillation Newly diagnosed heart failure with midrange ejection fraction, EF 40 to 45% Acute respiratory failure with hypoxia and hypercapnia Septic shock-has resolved Aspiration pneumonia Type 2 diabetes  He is still in atrial fibrillation but controlled rate, will continue Amiodarone 200 mg BID.  Continue heparin gtt for for now agree with DOAC on discharge. New diagnosed cardiomyopathy, blood pressure 40-45 blood pressure seems to be improving slightly and stable.  If this continues we will initiate low-dose Coreg tomorrow. Septic shock has resolved. Agree with getting palliative care on board.      For questions or updates, please contact [facility] Please consult [email or web address] for contact info under Cardiology/STEMI.      Signed, Stephen Brinlee, DO  03/17/2023, 11:14 AM

## 2023-03-18 ENCOUNTER — Inpatient Hospital Stay (HOSPITAL_COMMUNITY): Payer: 59

## 2023-03-18 DIAGNOSIS — I480 Paroxysmal atrial fibrillation: Secondary | ICD-10-CM | POA: Diagnosis not present

## 2023-03-18 DIAGNOSIS — T17908D Unspecified foreign body in respiratory tract, part unspecified causing other injury, subsequent encounter: Secondary | ICD-10-CM | POA: Diagnosis not present

## 2023-03-18 DIAGNOSIS — K56609 Unspecified intestinal obstruction, unspecified as to partial versus complete obstruction: Secondary | ICD-10-CM | POA: Diagnosis not present

## 2023-03-18 DIAGNOSIS — I509 Heart failure, unspecified: Secondary | ICD-10-CM

## 2023-03-18 DIAGNOSIS — K219 Gastro-esophageal reflux disease without esophagitis: Secondary | ICD-10-CM | POA: Diagnosis not present

## 2023-03-18 DIAGNOSIS — J9601 Acute respiratory failure with hypoxia: Secondary | ICD-10-CM | POA: Diagnosis not present

## 2023-03-18 LAB — POCT I-STAT 7, (LYTES, BLD GAS, ICA,H+H)
Acid-Base Excess: 4 mmol/L — ABNORMAL HIGH (ref 0.0–2.0)
Bicarbonate: 31.7 mmol/L — ABNORMAL HIGH (ref 20.0–28.0)
Calcium, Ion: 1.3 mmol/L (ref 1.15–1.40)
HCT: 26 % — ABNORMAL LOW (ref 39.0–52.0)
Hemoglobin: 8.8 g/dL — ABNORMAL LOW (ref 13.0–17.0)
O2 Saturation: 98 %
Potassium: 4.2 mmol/L (ref 3.5–5.1)
Sodium: 146 mmol/L — ABNORMAL HIGH (ref 135–145)
TCO2: 34 mmol/L — ABNORMAL HIGH (ref 22–32)
pCO2 arterial: 64.5 mmHg — ABNORMAL HIGH (ref 32–48)
pH, Arterial: 7.299 — ABNORMAL LOW (ref 7.35–7.45)
pO2, Arterial: 131 mmHg — ABNORMAL HIGH (ref 83–108)

## 2023-03-18 LAB — CBC
HCT: 26.1 % — ABNORMAL LOW (ref 39.0–52.0)
Hemoglobin: 7.8 g/dL — ABNORMAL LOW (ref 13.0–17.0)
MCH: 29.9 pg (ref 26.0–34.0)
MCHC: 29.9 g/dL — ABNORMAL LOW (ref 30.0–36.0)
MCV: 100 fL (ref 80.0–100.0)
Platelets: 205 10*3/uL (ref 150–400)
RBC: 2.61 MIL/uL — ABNORMAL LOW (ref 4.22–5.81)
RDW: 17.7 % — ABNORMAL HIGH (ref 11.5–15.5)
WBC: 4.4 10*3/uL (ref 4.0–10.5)
nRBC: 0 % (ref 0.0–0.2)

## 2023-03-18 LAB — BASIC METABOLIC PANEL
Anion gap: 7 (ref 5–15)
BUN: 53 mg/dL — ABNORMAL HIGH (ref 8–23)
CO2: 30 mmol/L (ref 22–32)
Calcium: 8.8 mg/dL — ABNORMAL LOW (ref 8.9–10.3)
Chloride: 108 mmol/L (ref 98–111)
Creatinine, Ser: 1.19 mg/dL (ref 0.61–1.24)
GFR, Estimated: 57 mL/min — ABNORMAL LOW (ref >60)
Glucose, Bld: 133 mg/dL — ABNORMAL HIGH (ref 70–99)
Potassium: 4.5 mmol/L (ref 3.5–5.1)
Sodium: 145 mmol/L (ref 135–145)

## 2023-03-18 LAB — CBC WITH DIFFERENTIAL/PLATELET
Abs Immature Granulocytes: 0.03 10*3/uL (ref 0.00–0.07)
Basophils Absolute: 0 10*3/uL (ref 0.0–0.1)
Basophils Relative: 0 %
Eosinophils Absolute: 0.2 10*3/uL (ref 0.0–0.5)
Eosinophils Relative: 3 %
HCT: 28.4 % — ABNORMAL LOW (ref 39.0–52.0)
Hemoglobin: 8.6 g/dL — ABNORMAL LOW (ref 13.0–17.0)
Immature Granulocytes: 1 %
Lymphocytes Relative: 8 %
Lymphs Abs: 0.5 10*3/uL — ABNORMAL LOW (ref 0.7–4.0)
MCH: 31 pg (ref 26.0–34.0)
MCHC: 30.3 g/dL (ref 30.0–36.0)
MCV: 102.5 fL — ABNORMAL HIGH (ref 80.0–100.0)
Monocytes Absolute: 0.2 10*3/uL (ref 0.1–1.0)
Monocytes Relative: 3 %
Neutro Abs: 5.2 10*3/uL (ref 1.7–7.7)
Neutrophils Relative %: 85 %
Platelets: 165 10*3/uL (ref 150–400)
RBC: 2.77 MIL/uL — ABNORMAL LOW (ref 4.22–5.81)
RDW: 17.8 % — ABNORMAL HIGH (ref 11.5–15.5)
WBC: 6.1 10*3/uL (ref 4.0–10.5)
nRBC: 0 % (ref 0.0–0.2)

## 2023-03-18 LAB — TYPE AND SCREEN
ABO/RH(D): O POS
Antibody Screen: NEGATIVE
Unit division: 0

## 2023-03-18 LAB — COMPREHENSIVE METABOLIC PANEL
ALT: 18 U/L (ref 0–44)
AST: 21 U/L (ref 15–41)
Albumin: 2.1 g/dL — ABNORMAL LOW (ref 3.5–5.0)
Alkaline Phosphatase: 48 U/L (ref 38–126)
Anion gap: 9 (ref 5–15)
BUN: 54 mg/dL — ABNORMAL HIGH (ref 8–23)
CO2: 28 mmol/L (ref 22–32)
Calcium: 8.5 mg/dL — ABNORMAL LOW (ref 8.9–10.3)
Chloride: 103 mmol/L (ref 98–111)
Creatinine, Ser: 1.14 mg/dL (ref 0.61–1.24)
GFR, Estimated: 60 mL/min — ABNORMAL LOW (ref >60)
Glucose, Bld: 276 mg/dL — ABNORMAL HIGH (ref 70–99)
Potassium: 4.2 mmol/L (ref 3.5–5.1)
Sodium: 140 mmol/L (ref 135–145)
Total Bilirubin: 0.7 mg/dL (ref 0.3–1.2)
Total Protein: 5.9 g/dL — ABNORMAL LOW (ref 6.5–8.1)

## 2023-03-18 LAB — GLUCOSE, CAPILLARY
Glucose-Capillary: 106 mg/dL — ABNORMAL HIGH (ref 70–99)
Glucose-Capillary: 112 mg/dL — ABNORMAL HIGH (ref 70–99)
Glucose-Capillary: 122 mg/dL — ABNORMAL HIGH (ref 70–99)
Glucose-Capillary: 132 mg/dL — ABNORMAL HIGH (ref 70–99)
Glucose-Capillary: 185 mg/dL — ABNORMAL HIGH (ref 70–99)
Glucose-Capillary: 88 mg/dL (ref 70–99)

## 2023-03-18 LAB — BPAM RBC
Blood Product Expiration Date: 202410122359
ISSUE DATE / TIME: 202409131126
Unit Type and Rh: 5100

## 2023-03-18 LAB — HEPARIN LEVEL (UNFRACTIONATED): Heparin Unfractionated: 0.33 [IU]/mL (ref 0.30–0.70)

## 2023-03-18 LAB — PHOSPHORUS: Phosphorus: 3.5 mg/dL (ref 2.5–4.6)

## 2023-03-18 LAB — LACTIC ACID, PLASMA: Lactic Acid, Venous: 1.4 mmol/L (ref 0.5–1.9)

## 2023-03-18 LAB — MAGNESIUM
Magnesium: 2.1 mg/dL (ref 1.7–2.4)
Magnesium: 2.5 mg/dL — ABNORMAL HIGH (ref 1.7–2.4)

## 2023-03-18 MED ORDER — FENTANYL CITRATE PF 50 MCG/ML IJ SOSY
25.0000 ug | PREFILLED_SYRINGE | Freq: Once | INTRAMUSCULAR | Status: AC
  Administered 2023-03-18: 25 ug via INTRAVENOUS

## 2023-03-18 MED ORDER — DOCUSATE SODIUM 50 MG/5ML PO LIQD
100.0000 mg | Freq: Two times a day (BID) | ORAL | Status: DC
  Administered 2023-03-18: 100 mg
  Filled 2023-03-18: qty 10

## 2023-03-18 MED ORDER — POLYETHYLENE GLYCOL 3350 17 G PO PACK: 17.0000 g | PACK | Freq: Every day | ORAL | Status: DC

## 2023-03-18 MED ORDER — FENTANYL 2500MCG IN NS 250ML (10MCG/ML) PREMIX INFUSION
INTRAVENOUS | Status: AC
  Administered 2023-03-18: 50 ug/h via INTRAVENOUS
  Filled 2023-03-18: qty 250

## 2023-03-18 MED ORDER — PROPOFOL 1000 MG/100ML IV EMUL: 5.0000 ug/kg/min | INTRAVENOUS | Status: DC

## 2023-03-18 MED ORDER — INSULIN ASPART 100 UNIT/ML IJ SOLN
0.0000 [IU] | INTRAMUSCULAR | Status: DC
  Administered 2023-03-18: 2 [IU] via SUBCUTANEOUS
  Administered 2023-03-19: 1 [IU] via SUBCUTANEOUS
  Administered 2023-03-19: 2 [IU] via SUBCUTANEOUS

## 2023-03-18 MED ORDER — LOPERAMIDE HCL 1 MG/7.5ML PO SUSP
2.0000 mg | Freq: Two times a day (BID) | ORAL | Status: DC
  Administered 2023-03-18 – 2023-03-22 (×8): 2 mg
  Filled 2023-03-18 (×12): qty 15

## 2023-03-18 MED ORDER — NOREPINEPHRINE 4 MG/250ML-% IV SOLN: 0.0000 ug/min | INTRAVENOUS | Status: DC

## 2023-03-18 MED ORDER — AMIODARONE HCL IN DEXTROSE 360-4.14 MG/200ML-% IV SOLN
30.0000 mg/h | INTRAVENOUS | Status: DC
  Administered 2023-03-18 – 2023-03-19 (×2): 30 mg/h via INTRAVENOUS
  Filled 2023-03-18 (×2): qty 200

## 2023-03-18 MED ORDER — NOREPINEPHRINE 4 MG/250ML-% IV SOLN
0.0000 ug/min | INTRAVENOUS | Status: DC
  Administered 2023-03-18: 8 ug/min via INTRAVENOUS
  Administered 2023-03-19 – 2023-03-21 (×2): 2 ug/min via INTRAVENOUS

## 2023-03-18 MED ORDER — METRONIDAZOLE 500 MG/100ML IV SOLN
500.0000 mg | Freq: Two times a day (BID) | INTRAVENOUS | Status: DC
  Administered 2023-03-18 – 2023-03-19 (×2): 500 mg via INTRAVENOUS
  Filled 2023-03-18 (×2): qty 100

## 2023-03-18 MED ORDER — SODIUM CHLORIDE 0.9 % IV SOLN
2.0000 g | INTRAVENOUS | Status: AC
  Administered 2023-03-18 – 2023-03-22 (×5): 2 g via INTRAVENOUS
  Filled 2023-03-18 (×5): qty 20

## 2023-03-18 MED ORDER — ACETAMINOPHEN 160 MG/5ML PO SOLN
1000.0000 mg | Freq: Three times a day (TID) | ORAL | Status: DC
  Administered 2023-03-18 – 2023-03-22 (×12): 1000 mg
  Filled 2023-03-18 (×12): qty 40.6

## 2023-03-18 MED ORDER — FENTANYL 2500MCG IN NS 250ML (10MCG/ML) PREMIX INFUSION: 0.0000 ug/h | INTRAVENOUS | Status: DC

## 2023-03-18 MED ORDER — PROPOFOL 1000 MG/100ML IV EMUL
0.0000 ug/kg/min | INTRAVENOUS | Status: DC
  Administered 2023-03-18: 15 ug/kg/min via INTRAVENOUS
  Filled 2023-03-18 (×2): qty 100

## 2023-03-18 MED ORDER — FENTANYL 2500MCG IN NS 250ML (10MCG/ML) PREMIX INFUSION: 25.0000 ug/h | INTRAVENOUS | Status: DC

## 2023-03-18 MED ORDER — AMIODARONE HCL IN DEXTROSE 360-4.14 MG/200ML-% IV SOLN
60.0000 mg/h | INTRAVENOUS | Status: AC
  Administered 2023-03-18: 60 mg/h via INTRAVENOUS
  Filled 2023-03-18: qty 200

## 2023-03-18 MED ORDER — PROPOFOL 1000 MG/100ML IV EMUL
INTRAVENOUS | Status: AC
  Administered 2023-03-18: 15 ug/kg/min via INTRAVENOUS
  Filled 2023-03-18: qty 100

## 2023-03-18 MED ORDER — FENTANYL BOLUS VIA INFUSION
25.0000 ug | INTRAVENOUS | Status: DC | PRN
  Administered 2023-03-18: 50 ug via INTRAVENOUS

## 2023-03-18 NOTE — Progress Notes (Signed)
[name], MRN:  [id number], DOB:  December 14, 1927, LOS: 21 ADMISSION DATE:  02/16/2023, CONSULTATION DATE: 8/26 REFERRING MD: Dr. Randol Kern, CHIEF COMPLAINT: Hypotension  History of Present Illness:  Patient is encephalopathic and/or intubated; therefore, history has been obtained from chart review.  89 year old male with past medical history as below, which is significant for atrial fibrillation on Eliquis, wheelchair-bound, HFpEF, and stroke who presented to [facility] emergency department on 8/25 with complaints of acute onset abdominal pain with no bowel movement for 3 days prior to presentation.  Also sounds like he had not been consistently taking his Eliquis.  Upon arrival to the emergency department the patient was noted to be in atrial fibrillation with RVR to the 120s.  Started on diltiazem infusion and placed on heparin drip.  CT of the abdomen and pelvis demonstrated small bowel obstruction and moderate ascites.  General surgery was consulted and recommended conservative management with NG tube decompression.  He was admitted to the hospitalist service.  In the a.m. hours of 8/26 the patient developed waning mental status and became nonverbal.  He also developed hypotension with systolic pressures in the 70s and desaturation to the 80s.  His abdomen became more firm and rigid and exquisitely tender to pain.  The surgical team was at bedside and determined the patient would likely require urgent surgery.  PCCM was consulted for hypotension and hypoxia.  Pertinent  Medical History   has a past medical history of Anemia, Aortic insufficiency, Chest pain, H/O: GI bleed, Hyperlipidemia, Hypertension, Leg cramps, and Stroke (HCC).   Significant Hospital Events: Including procedures, antibiotic start and stop dates in addition to other pertinent events   8/25 admit for SBO 8/26 acute abdomen, tx to ICU for pressors. PICC line placed. Underwent Ex lap and small bowel  resection 8/27 Extubated 9/1 Reintubated > bronchoscopy, BAL >> few yeast 9/5 1u pRBC for Hgb 6.6 without signs of bleeding   9/6 extubated  9/10 transferred out of ICU and to [facility]  9/15 bradycardic to PEA arrest ~ down time, pupils 8 and fixed post arrest   Interim History / Subjective:  PEA cardiac arrest   Objective   Blood pressure 98/85, pulse 96, temperature 97.6 F (36.4 C), temperature source Oral, resp. rate 20, height 5' 9.02" (1.753 m), weight 113.2 kg, SpO2 93%.          Intake/Output Summary (Last 24 hours) at 03/18/2023 1313 Last data filed at 03/18/2023 1151 Gross per 24 hour  Intake 286.16 ml  Output 1000 ml  Net -713.84 ml   Filed Weights   03/15/23 0500 03/16/23 0409 03/18/23 0549  Weight: 106.7 kg 106.7 kg 113.2 kg    Examination: General: Acute on chronically ill appearing elderly  male lying in bed on mechanical ventilation, in NAD HEENT: ETT, MM pink/moist, PERRL,  Neuro: Sedated on vent  CV: s1s2 regular rate and rhythm, no murmur, rubs, or gallops,  PULM:  Rhonchi bilaterally, no increased work of breathing, tolerating vent  GI: soft, bowel sounds active in all 4 quadrants, non-tender, non-distended Extremities: warm/dry, no edema  Skin: no rashes or lesions  Resolved Hospital Problem list   AKI Hypernatremia   Assessment & Plan:  In hospital cardiac arrest  -Bradycardic to PEA arrest ~ down time, pupils 8 and fixed post arrest  HFrEF -EF 40-45% with global hypokinesis 03/01/2023  Paroxysmal atrial fibrillation  P: Continuous telemetry  Continue pressors for MAP goal > 65 Cardiology  following  Heparin drip  Strict intake and output  Daily weight to assess volume status Daily assessment for need to diurese Closely monitor renal function and electrolytes   Acute Hypoxic Respiratory Failure  -Intubated during arrest  Recurrent aspiration pneumonia  -Received meropenem 9/1-9/1, post arrest copious tracheal secretions seen   P: Start Ceftriaxone and flagyl  Continue ventilator support with lung protective strategies  Wean PEEP and FiO2 for sats greater than 90%. Head of bed elevated 30 degrees. Plateau pressures less than 30 cm H20.  Follow intermittent chest x-ray and ABG.   SAT/SBT as tolerated, mentation preclude extubation  Ensure adequate pulmonary hygiene  Follow cultures  VAP bundle in place  PAD protocol  Small bowel obstruction s/p ex lap and small bowel resection 02/27/2023 Acute abdomen, resolved P: Primary management per [facility]  Postpyloric tube feeds  Optimize nutrition as able  Wound vac per [facility]CS  Acute metabolic encephalopathy Concern for anoxic brain injury  P:  Maintain neuro protective measures; goal for eurothermia, euglycemia, eunatermia, normoxia, and PCO2 goal of 35-40 Nutrition and bowel regiment  Seizure precautions  Aspirations precautions  Head CT when stable   Anemia, due to critical illness superimposed on iron deficiency anemia  P: Trend CBC  Transfuse per protocl  Hgb goal > 7  Diabetes mellitus, controlled Hemoglobin A1c 6.5 P: SSI  CBG goal 140-180 CBG checks q4  Moderate malnutrition Baseline debility: Wheelchair-bound P: Ongoing GOC discussion  Optimize nutrition  Tube feeds  RD following   Best Practice (right click and "Reselect all SmartList Selections" daily)   Diet/type: NPO  TF DVT prophylaxis: Systemic heparin GI prophylaxis: N/A Lines: N/A Foley:  N/A Code Status:  full code Last date of multidisciplinary goals of care discussion Pending post arrest   Yon Schiffman D. Harris, NP-C [facility] Personal contact information can be found on Amion  If no contact or response made please call [phone] 03/18/2023, 1:42 PM

## 2023-03-18 NOTE — Progress Notes (Signed)
ANTICOAGULATION CONSULT NOTE-Follow Up  Pharmacy Consult for Heparin Indication: atrial fibrillation  Allergies  Allergen Reactions   Penicillin G     Other reaction(s): Drowsy   Penicillins Hives    Did it involve swelling of the face/tongue/throat, SOB, or low BP? No Did it involve sudden or severe rash/hives, skin peeling, or any reaction on the inside of your mouth or nose? yes Did you need to seek medical attention at a hospital or doctor's office? yes When did it last happen?  child     If all above answers are "NO", may proceed with cephalosporin use.    Patient Measurements: Height: 5' 9.02" (175.3 cm) Weight: 113.2 kg (249 lb 9 oz) IBW/kg (Calculated) : 70.74 Heparin Dosing Weight: 95 kg  Vital Signs: Temp: 98.1 F (36.7 C) (09/15 0810) Temp Source: Axillary (09/15 0810) BP: 103/65 (09/15 0810) Pulse Rate: 75 (09/15 0846)  Labs: Recent Labs    03/16/23 0410 03/16/23 1315 03/16/23 1830 03/17/23 0148 03/17/23 0606 03/18/23 0635  HGB 7.0*  --  7.9*  --   --  7.8*  HCT 22.9*  --  26.1*  --   --  26.1*  PLT 202  --   --   --   --  205  HEPARINUNFRC 0.14*   < >  --  0.32 0.34 0.33  CREATININE 1.17  --   --   --  1.12 1.19   < > = values in this interval not displayed.    Estimated Creatinine Clearance: 48.1 mL/min (by C-G formula based on SCr of 1.19 mg/dL).   Assessment: 89 YOM s/p SBR on Xarelto prior to admission for atrial fibrillation. Plans to D/C wound VAC on Monday (9/16).  Heparin level therapeutic (0.33) on infusion at 1350 units/hr. No issues with line or bleeding reported per RN. CBC stable - Hgb 7.8, plts 205. Appears plan per cards is to transition to oral [facility] on discharge.  Goal of Therapy:  Heparin level 0.3-0.5 units/ml Monitor platelets by anticoagulation protocol: Yes   Plan:  Continue heparin at 1350 units / hr Daily heparin level and CBC F/u plans to transition to oral [facility]  Nicole Kindred, PharmD PGY1 Pharmacy Resident 03/18/2023  8:58 AM

## 2023-03-18 NOTE — Progress Notes (Signed)
Progress Note  Patient Name: Stephen Flynn Date of Encounter: 03/18/2023  Primary Cardiologist: None   Subjective   Patient seen and examined at her bedside.  Inpatient Medications    Scheduled Meds:  acetaminophen  1,000 mg Oral TID   amiodarone  200 mg Oral BID   Chlorhexidine Gluconate Cloth  6 each Topical Daily   feeding supplement (PROSource TF20)  60 mL Per Tube Daily   fiber supplement (BANATROL TF)  60 mL Per Tube BID   free water  300 mL Per Tube Q4H   ipratropium  0.5 mg Nebulization BID   levalbuterol  1.25 mg Nebulization BID   loperamide HCl  2 mg Oral BID   nutrition supplement (JUVEN)  1 packet Per Tube BID BM   mouth rinse  15 mL Mouth Rinse Q2H   scopolamine  1 patch Transdermal Q72H   sodium chloride flush  10-40 mL Intracatheter Q12H   Continuous Infusions:  sodium chloride Stopped (03/14/23 2139)   sodium chloride Stopped (03/14/23 2140)   feeding supplement (OSMOLITE 1.5 CAL) 50 mL/hr at 03/16/23 0542   heparin 1,350 Units/hr (03/17/23 2348)   PRN Meds: sodium chloride, sodium chloride, guaiFENesin, hydrALAZINE, HYDROmorphone (DILAUDID) injection, ipratropium-albuterol, metoprolol tartrate, ondansetron **OR** ondansetron (ZOFRAN) IV, mouth rinse, senna-docusate, sodium chloride flush, traZODone   Vital Signs    Vitals:   03/18/23 0315 03/18/23 0549 03/18/23 0810 03/18/23 0846  BP: 96/65  103/65   Pulse: 65  (!) 59 75  Resp: 17  20 (!) 22  Temp: 98.2 F (36.8 C)  98.1 F (36.7 C)   TempSrc: Oral  Axillary   SpO2: 95%  100% 93%  Weight:  113.2 kg    Height:        Intake/Output Summary (Last 24 hours) at 03/18/2023 1046 Last data filed at 03/17/2023 2348 Gross per 24 hour  Intake 286.16 ml  Output 300 ml  Net -13.84 ml   Filed Weights   03/15/23 0500 03/16/23 0409 03/18/23 0549  Weight: 106.7 kg 106.7 kg 113.2 kg    Telemetry    Atrial fibrillation with controlled ventricular rate  - Personally Reviewed  ECG    None today -  Personally Reviewed  Physical Exam    General: Comfortable Head: Atraumatic, normal size  Eyes: PEERLA, EOMI  Neck: Supple, normal JVD Cardiac: Normal S1, S2; RRR; no murmurs, rubs, or gallops Lungs: Clear to auscultation bilaterally Abd: Soft, nontender, no hepatomegaly  Ext: warm, no edema Musculoskeletal: No deformities, BUE and BLE strength normal and equal Skin: Warm and dry, no rashes   Neuro: Alert and oriented to person, place, time, and situation, CNII-XII grossly intact, no focal deficits  Psych: Normal mood and affect   Labs    Chemistry Recent Labs  Lab 03/14/23 0620 03/15/23 0404 03/16/23 0410 03/17/23 0606 03/18/23 0635  NA 148*   < > 149* 147* 145  K 4.1   < > 3.8 4.3 4.5  CL 113*   < > 115* 112* 108  CO2 30   < > 29 29 30   GLUCOSE 120*   < > 133* 155* 133*  BUN 60*   < > 52* 48* 53*  CREATININE 1.15   < > 1.17 1.12 1.19  CALCIUM 9.1   < > 8.9 8.8* 8.8*  PROT 6.4*  --   --   --   --   ALBUMIN 2.4*  --   --   --   --   AST 20  --   --   --   --  ALT 21  --   --   --   --   ALKPHOS 57  --   --   --   --   BILITOT 0.5  --   --   --   --   GFRNONAA 59*   < > 58* >60 57*  ANIONGAP 5   < > 5 6 7    < > = values in this interval not displayed.     Hematology Recent Labs  Lab 03/15/23 0404 03/16/23 0410 03/16/23 1830 03/18/23 0635  WBC 3.8* 3.3*  --  4.4  RBC 2.33* 2.28*  --  2.61*  HGB 7.2* 7.0* 7.9* 7.8*  HCT 23.2* 22.9* 26.1* 26.1*  MCV 99.6 100.4*  --  100.0  MCH 30.9 30.7  --  29.9  MCHC 31.0 30.6  --  29.9*  RDW 16.4* 16.5*  --  17.7*  PLT 219 202  --  205    Cardiac EnzymesNo results for input(s): "TROPONINI" in the last 168 hours. No results for input(s): "TROPIPOC" in the last 168 hours.   BNP Recent Labs  Lab 03/14/23 0620  BNP 563.9*     DDimer No results for input(s): "DDIMER" in the last 168 hours.   Radiology    No results found.  Cardiac Studies   Reviewed echocardiogram from February 26, 2023  Patient Profile      89 y.o. male with permanent atrial fibrillation and newly diagnosed heart failure cardiomyopathy  Assessment & Plan    Atrial fibrillation Newly diagnosed heart failure with midrange ejection fraction, EF 40 to 45% Acute respiratory failure with hypoxia and hypercapnia Septic shock-has resolved Aspiration pneumonia Type 2 diabetes  He is still in atrial fibrillation but controlled rate, will continue Amiodarone 200 mg BID.  Continue heparin gtt for for now agree with DOAC on discharge. New diagnosed cardiomyopathy, blood pressure 40-45 blood pressure seems to be improving slightly and stable.  If this continues we will initiate low-dose Coreg tomorrow. Septic shock has resolved. Agree with getting palliative care on board.      For questions or updates, please contact [facility] Please consult [email or web address] for contact info under Cardiology/STEMI.      Signed, Thomasene Ripple, DO  03/18/2023, 10:46 AM

## 2023-03-18 NOTE — Progress Notes (Signed)
Patient Name: Stephen Flynn   MRN: [id number]   Date of Birth/ Sex: Apr 26, 1928 , male      Admission Date: 15-Mar-2023  Attending Provider: Martina Sinner, MD  Primary Diagnosis: SBO (small bowel obstruction) [facility])   Indication: Pt was in his usual state of health until this AM, when he was noted to be unresponsive in PEA. Code blue was subsequently called. At the time of arrival on scene, ACLS protocol was underway.   Technical Description:  - CPR performance duration:  15 minutes  - Was defibrillation or cardioversion used? No   - Was external pacer placed? Yes  - Was patient intubated pre/post CPR? Yes   Medications Administered: Y = Yes; Blank = No Amiodarone    Atropine    Calcium    Epinephrine  y  Lidocaine    Magnesium  y  Norepinephrine    Phenylephrine    Sodium bicarbonate  y  Vasopressin     Post CPR evaluation:  - Final Status - Was patient successfully resuscitated ? Yes - What is current rhythm? Normal sinus - What is current hemodynamic status? Critical but stable  Miscellaneous Information:  - Labs sent, including: no  - Primary team notified?  Yes  - Family Notified? Yes  - Additional notes/ transfer status: Transfer to 2H     Katheran James, DO  03/18/2023, 1:23 PM

## 2023-03-18 NOTE — Progress Notes (Signed)
PROGRESS NOTE    Stephen Flynn  QIO:[id number] DOB: [birth year] DOA: 02/03/2023 PCP: Creola Corn, MD    Brief Narrative:   89 year old male with past history of persistent atrial fibrillation on Eliquis, wheelchair-bound, chronic diastolic heart failure, hypertension, unspecified CVA, BPH, diabetes mellitus type 2, thoracic AAA presented to the hospital with worsening abdominal pain and no bowel movement 3 days prior to presentation.  In the ED he was noted to have A-fib with RVR requiring Cardizem and heparin drips.  CT of abdomen and pelvis showed small bowel obstruction and moderate ascites.  General surgery initially recommended conservative management with NG tube decompression but subsequently patient was,confused/nonverbal with hypotension and desaturation with increased abdominal tenderness.  He was then transferred to ICU.  He underwent exploratory laparotomy and small bowel resection on 02/15/2023; returned back to ICU intubated.  He was extubated on 02/27/2023.  He was reintubated on 03/04/2023.  He had bronchoscopy with BAL on 03/04/2023.  Received 1 unit packed red cells on 03/08/2023 for hemoglobin of 6.6.  He was extubated again on 03/09/2023. He was on TPN initially which has been discontinued and is currently on cortrak feedings.  He was transferred to [facility] service from 03/13/2023.  At this time, patient remains in the hospital.  Recurrent aspiration.  Unable to eat safely.    Assessment & Plan:   Small bowel obstruction status post expiratory laparotomy and small bowel resection -Status post expiratory laparotomy with bowel resection in 02/27/2023.  Wound VAC and postop management per general surgery.  Continue cortrak tube postpyloric feeding.  Has been having loose stools.  On Flexi-Seal, Imodium.  Appears very weak and deconditioned.  Acute respiratory failure with hypoxia and hypercapnia Septic shock: Present on admission, resolved Aspiration pneumonia Patient was initially intubated and  subsequently extubated on 03/09/23.  Continue bronchodilators, incentive spirometry, flutter valve if able to participate.  Patient has completed course of meropenem (9/1 - 9/7).  On 9/11, possible aspiration again but procalcitonin less than 0.1.  Holding off with antibiotic.  Continue chest therapy as tolerated.  Currently on nasal cannula oxygen.  Might benefit from suctioning.  Acute metabolic and septic encephalopathy With waxing and waning mental status.  Very slow to respond and minimally verbal.   Persistent A-fib with RVR Rate controlled, on amiodarone 200 mg twice daily..  On heparin drip.  Plan for Xarelto when p.o. able.  Cardiology following.  2D echocardiogram with EF of 40 to 45%.  Cardiology has plans for initiating beta-blocker at some point.   Acute systolic heart failure in a patient with history of chronic diastolic heart failure,  2D echocardiogram showing LV EF 45%.  Plan for GDMT but deficient oral intake.   AKI Resolved.  Latest creatinine of 1.1.   Hypernatremia Continue free water flushes.  Sodium level today at 147.   Diabetes mellitus type 2 POC glucose controlled.  Recent hemoglobin A1c 6.5.  Continue sliding scale insulin, Accu-Cheks, cortrak tube tube feeding   Anemia of chronic disease Anemia due to critical illness Transfuse for hemoglobin less than 7.  Received PRBC transfusion during hospitalization.  Globin today at 7.8.   Generalized deconditioning Baseline debility with Wheelchair-bound status. Palliative care on board, full code.  Overall poor prognosis with poor chances of recovery and meaningful rehab.  Will need ongoing goals of care discussion.   Moderate malnutrition  Nutrition Problem: Moderate Malnutrition Etiology: chronic illness Signs/Symptoms: mild fat depletion, severe muscle depletion Interventions: Refer to RD note for recommendations  Body mass index  is 36.84 kg/m.   Patient was on TPN, discontinued on 03/09/2023 and currently on  cortrak tube tube feeding.[name] Kitchen Speech follow-up on 03/16/2023 with unable to safely swallow and recommend keep NPO.  Deconditioning debility.  Physical therapy has seen the patient and recommend inpatient rehab.    DVT prophylaxis: Heparin drip  Code Status: Full  Family Communication: None at bedside   Disposition Plan: Likely to CIR as per PT evaluation.  Status is: Inpatient  Remains inpatient appropriate because: Of severity of illness, pending clinical improvement, multiple comorbidities and profound weakness   Subjective:  Today, patient was seen and examined at bedside.  Has been having loose stools.  Received 1 unit of packed RBC yesterday.  Very sick lately male with upper airway sounds.  Physical examination:  Body mass index is 36.84 kg/m.   General: Obese built, not in obvious distress, appears frail and debilitated, elderly male, cortrak tube tube in place.  Upper airway sounds noted. HENT:   No scleral pallor or icterus noted. Oral mucosa is moist.  Chest:  .  Diminished breath sounds bilaterally.  Coarse breath sounds noted CVS: S1 &S2 heard. No murmur.  Regular rate and rhythm. Abdomen: Soft, midline surgical incision with wound VAC, tenderness over the incision site bowel sounds are heard.   Extremities: No cyanosis, clubbing with trace edema, right upper extremity PICC line in place, bilateral upper extremity on mittens. Psych: Alert, awake and minimally interactive today, moves extremities, CNS:  No cranial nerve deficits.  Generalized weakness noted. Skin: Warm and dry.  Midline abdominal incision with dressing    Objective: Vitals:   03/18/23 0549 03/18/23 0810 03/18/23 0846 03/18/23 1127  BP:  103/65  98/85  Pulse:  (!) 59 75 96  Resp:  20 (!) 22 20  Temp:  98.1 F (36.7 C)  97.6 F (36.4 C)  TempSrc:  Axillary  Oral  SpO2:  100% 93% 93%  Weight: 113.2 kg     Height:        Intake/Output Summary (Last 24 hours) at 03/18/2023 1206 Last data filed  at 03/18/2023 1151 Gross per 24 hour  Intake 286.16 ml  Output 1000 ml  Net -713.84 ml   Filed Weights   03/15/23 0500 03/16/23 0409 03/18/23 0549  Weight: 106.7 kg 106.7 kg 113.2 kg    Scheduled Meds:  acetaminophen  1,000 mg Oral TID   amiodarone  200 mg Oral BID   Chlorhexidine Gluconate Cloth  6 each Topical Daily   feeding supplement (PROSource TF20)  60 mL Per Tube Daily   fiber supplement (BANATROL TF)  60 mL Per Tube BID   free water  300 mL Per Tube Q4H   ipratropium  0.5 mg Nebulization BID   levalbuterol  1.25 mg Nebulization BID   loperamide HCl  2 mg Oral BID   nutrition supplement (JUVEN)  1 packet Per Tube BID BM   mouth rinse  15 mL Mouth Rinse Q2H   scopolamine  1 patch Transdermal Q72H   sodium chloride flush  10-40 mL Intracatheter Q12H   Continuous Infusions:  sodium chloride Stopped (03/14/23 2139)   sodium chloride Stopped (03/14/23 2140)   feeding supplement (OSMOLITE 1.5 CAL) 50 mL/hr at 03/16/23 0542   heparin 1,350 Units/hr (03/17/23 2348)    Nutritional status Signs/Symptoms: mild fat depletion, severe muscle depletion Interventions: Refer to RD note for recommendations Body mass index is 36.84 kg/m.  Data Reviewed:   CBC: Recent Labs  Lab 03/13/23 0655 03/14/23  4332 03/15/23 0404 03/16/23 0410 03/16/23 1830 03/18/23 0635  WBC 4.3 4.6 3.8* 3.3*  --  4.4  HGB 7.4* 7.3* 7.2* 7.0* 7.9* 7.8*  HCT 24.3* 24.3* 23.2* 22.9* 26.1* 26.1*  MCV 97.6 99.2 99.6 100.4*  --  100.0  PLT 251 245 219 202  --  205   Basic Metabolic Panel: Recent Labs  Lab 03/14/23 0620 03/15/23 0404 03/16/23 0410 03/17/23 0606 03/18/23 0635  NA 148* 152* 149* 147* 145  K 4.1 4.1 3.8 4.3 4.5  CL 113* 112* 115* 112* 108  CO2 30 30 29 29 30   GLUCOSE 120* 131* 133* 155* 133*  BUN 60* 57* 52* 48* 53*  CREATININE 1.15 1.27* 1.17 1.12 1.19  CALCIUM 9.1 9.4 8.9 8.8* 8.8*  MG 2.1 2.1 2.1 2.0 2.1  PHOS  --  3.8  --   --   --    GFR: Estimated Creatinine  Clearance: 48.1 mL/min (by C-G formula based on SCr of 1.19 mg/dL). Liver Function Tests: Recent Labs  Lab 03/14/23 0620  AST 20  ALT 21  ALKPHOS 57  BILITOT 0.5  PROT 6.4*  ALBUMIN 2.4*   No results for input(s): "LIPASE", "AMYLASE" in the last 168 hours. No results for input(s): "AMMONIA" in the last 168 hours. Coagulation Profile: Recent Labs  Lab 03/15/23 0404  INR 1.4*   Cardiac Enzymes: No results for input(s): "CKTOTAL", "CKMB", "CKMBINDEX", "TROPONINI" in the last 168 hours. BNP (last 3 results) No results for input(s): "PROBNP" in the last 8760 hours. HbA1C: No results for input(s): "HGBA1C" in the last 72 hours. CBG: Recent Labs  Lab 03/17/23 1956 03/17/23 2331 03/18/23 0334 03/18/23 0813 03/18/23 1144  GLUCAP 115* 101* 122* 112* 106*   Lipid Profile: No results for input(s): "CHOL", "HDL", "LDLCALC", "TRIG", "CHOLHDL", "LDLDIRECT" in the last 72 hours. Thyroid Function Tests: No results for input(s): "TSH", "T4TOTAL", "FREET4", "T3FREE", "THYROIDAB" in the last 72 hours. Anemia Panel: No results for input(s): "VITAMINB12", "FOLATE", "FERRITIN", "TIBC", "IRON", "RETICCTPCT" in the last 72 hours. Sepsis Labs: Recent Labs  Lab 03/14/23 0620  PROCALCITON <0.10    Recent Results (from the past 240 hour(s))  Culture, blood (Routine X 2) w Reflex to ID Panel     Status: None (Preliminary result)   Collection Time: 03/14/23  9:40 AM   Specimen: BLOOD LEFT HAND  Result Value Ref Range Status   Specimen Description BLOOD LEFT HAND  Final   Special Requests   Final    BOTTLES DRAWN AEROBIC AND ANAEROBIC Blood Culture results may not be optimal due to an inadequate volume of blood received in culture bottles   Culture   Final    NO GROWTH 4 DAYS Performed at [facility], [address], [place], [place] [address]    Report Status PENDING  Incomplete  Culture, blood (Routine X 2) w Reflex to ID Panel     Status: None (Preliminary result)    Collection Time: 03/14/23  9:40 AM   Specimen: BLOOD LEFT HAND  Result Value Ref Range Status   Specimen Description BLOOD LEFT HAND  Final   Special Requests   Final    AEROBIC BOTTLE ONLY Blood Culture results may not be optimal due to an inadequate volume of blood received in culture bottles   Culture   Final    NO GROWTH 4 DAYS Performed at [facility], [address], [place], [place] [address]    Report Status PENDING  Incomplete  Radiology Studies: No results found.    LOS: 21 days    Joycelyn Das, MD [facility] 03/18/2023, 12:06 PM

## 2023-03-18 NOTE — Progress Notes (Signed)
Critical Care Note  Patient Name: Stephen Flynn Date of Encounter: 03/18/2023  Subjective   Called for CODE BLUE.   PEA arrest.  12:37 to 12:52. No Shocks needed. Frothy secretions post code. Voiding of bowel contesnts.  Vital Signs    Vitals:   03/18/23 0549 03/18/23 0810 03/18/23 0846 03/18/23 1127  BP:  103/65  98/85  Pulse:  (!) 59 75 96  Resp:  20 (!) 22 20  Temp:  98.1 F (36.7 C)  97.6 F (36.4 C)  TempSrc:  Axillary  Oral  SpO2:  100% 93% 93%  Weight: 113.2 kg     Height:        Intake/Output Summary (Last 24 hours) at 03/18/2023 1318 Last data filed at 03/18/2023 1151 Gross per 24 hour  Intake 286.16 ml  Output 1000 ml  Net -713.84 ml   Filed Weights   03/15/23 0500 03/16/23 0409 03/18/23 0549  Weight: 106.7 kg 106.7 kg 113.2 kg    Physical Exam   GEN: Non responsive Neck: No JVD Cardiac: RRR, distant heart sounds Respiratory: Clear to auscultation bilaterally post intubation GI: Soft, distended, surgical site clean MS: No edema Skin: left arm ulceration, R arm PICC  Labs   Telemetry: Bradycardia-> PEA   Chemistry Recent Labs  Lab 03/14/23 0620 03/15/23 0404 03/16/23 0410 03/17/23 0606 03/18/23 0635  NA 148*   < > 149* 147* 145  K 4.1   < > 3.8 4.3 4.5  CL 113*   < > 115* 112* 108  CO2 30   < > 29 29 30   GLUCOSE 120*   < > 133* 155* 133*  BUN 60*   < > 52* 48* 53*  CREATININE 1.15   < > 1.17 1.12 1.19  CALCIUM 9.1   < > 8.9 8.8* 8.8*  PROT 6.4*  --   --   --   --   ALBUMIN 2.4*  --   --   --   --   AST 20  --   --   --   --   ALT 21  --   --   --   --   ALKPHOS 57  --   --   --   --   BILITOT 0.5  --   --   --   --   GFRNONAA 59*   < > 58* >60 57*  ANIONGAP 5   < > 5 6 7    < > = values in this interval not displayed.     Hematology Recent Labs  Lab 03/15/23 0404 03/16/23 0410 03/16/23 1830 03/18/23 0635  WBC 3.8* 3.3*  --  4.4  RBC 2.33* 2.28*  --  2.61*  HGB 7.2* 7.0* 7.9* 7.8*  HCT 23.2* 22.9* 26.1* 26.1*  MCV  99.6 100.4*  --  100.0  MCH 30.9 30.7  --  29.9  MCHC 31.0 30.6  --  29.9*  RDW 16.4* 16.5*  --  17.7*  PLT 219 202  --  205    Cardiac EnzymesNo results for input(s): "TROPONINI" in the last 168 hours. No results for input(s): "TROPIPOC" in the last 168 hours.   BNP Recent Labs  Lab 03/14/23 0620  BNP 563.9*     DDimer No results for input(s): "DDIMER" in the last 168 hours.   Cardiac Studies   Cardiac Studies & Procedures       ECHOCARDIOGRAM  ECHOCARDIOGRAM COMPLETE 02/09/2023  Narrative ECHOCARDIOGRAM REPORT    Patient Name:  Stephen Flynn Date of Exam: 02/08/2023 Medical Rec #:  [id number]     Height:       69.0 in Accession #:    [id number]    Weight:       230.0 lb Date of Birth:  March 25, 1928      BSA:          2.192 m Patient Age:    89 years      BP:           99/80 mmHg Patient Gender: M             HR:           84 bpm. Exam Location:  Inpatient  Procedure: 2D Echo, Color Doppler and Cardiac Doppler  Indications:    Afib  History:        Patient has prior history of Echocardiogram examinations, most recent 03/22/2019. CHF; Risk Factors:Diabetes, Dyslipidemia and Hypertension.  Sonographer:    Milbert Coulter Referring Phys: [id number] Boyce Medici GONFA   Sonographer Comments: Image acquisition challenging due to respiratory motion and pleural effusion and hard abdomen presumably from SBO. IMPRESSIONS   1. Left ventricular ejection fraction, by estimation, is 40 to 45%. The left ventricle has mildly decreased function. The left ventricle demonstrates global hypokinesis. There is moderate concentric left ventricular hypertrophy. Left ventricular diastolic function could not be evaluated. 2. Right ventricular systolic function is normal. The right ventricular size is normal. There is normal pulmonary artery systolic pressure. The estimated right ventricular systolic pressure is 31.7 mmHg. 3. Left atrial size was mildly dilated. 4. The mitral valve is degenerative.  Trivial mitral valve regurgitation. No evidence of mitral stenosis. 5. The aortic valve is tricuspid. Aortic valve regurgitation is mild. Aortic valve sclerosis/calcification is present, without any evidence of aortic stenosis. Aortic valve area, by VTI measures 2.28 cm. Aortic valve mean gradient measures 5.0 mmHg. Aortic valve Vmax measures 1.41 m/s. 6. The inferior vena cava is normal in size with greater than 50% respiratory variability, suggesting right atrial pressure of 3 mmHg. 7. Ascending aorta measurements are within normal limits for age when indexed to body surface area.  FINDINGS Left Ventricle: Left ventricular ejection fraction, by estimation, is 40 to 45%. The left ventricle has mildly decreased function. The left ventricle demonstrates global hypokinesis. The left ventricular internal cavity size was normal in size. There is moderate concentric left ventricular hypertrophy. Abnormal (paradoxical) septal motion, consistent with left bundle branch block. Left ventricular diastolic function could not be evaluated due to atrial fibrillation. Left ventricular diastolic function could not be evaluated.  Right Ventricle: The right ventricular size is normal. No increase in right ventricular wall thickness. Right ventricular systolic function is normal. There is normal pulmonary artery systolic pressure. The tricuspid regurgitant velocity is 2.68 m/s, and with an assumed right atrial pressure of 3 mmHg, the estimated right ventricular systolic pressure is 31.7 mmHg.  Left Atrium: Left atrial size was mildly dilated.  Right Atrium: Right atrial size was normal in size.  Pericardium: Trivial pericardial effusion is present. The pericardial effusion is posterior to the left ventricle.  Mitral Valve: The mitral valve is degenerative in appearance. There is mild thickening of the mitral valve leaflet(s). There is mild calcification of the mitral valve leaflet(s). Mild mitral annular  calcification. Trivial mitral valve regurgitation. No evidence of mitral valve stenosis.  Tricuspid Valve: The tricuspid valve is normal in structure. Tricuspid valve regurgitation is mild . No evidence of tricuspid stenosis.  Aortic Valve:  The aortic valve is tricuspid. Aortic valve regurgitation is mild. Aortic valve sclerosis/calcification is present, without any evidence of aortic stenosis. Aortic valve mean gradient measures 5.0 mmHg. Aortic valve peak gradient measures 8.0 mmHg. Aortic valve area, by VTI measures 2.28 cm.  Pulmonic Valve: The pulmonic valve was normal in structure. Pulmonic valve regurgitation is not visualized. No evidence of pulmonic stenosis.  Aorta: The aortic root is normal in size and structure. Ascending aorta measurements are within normal limits for age when indexed to body surface area.  Venous: The inferior vena cava is normal in size with greater than 50% respiratory variability, suggesting right atrial pressure of 3 mmHg.  IAS/Shunts: No atrial level shunt detected by color flow Doppler.   LEFT VENTRICLE PLAX 2D LVIDd:         4.10 cm LVIDs:         3.50 cm LV PW:         1.50 cm LV IVS:        1.30 cm LVOT diam:     2.20 cm LV SV:         48 LV SV Index:   22 LVOT Area:     3.80 cm   RIGHT VENTRICLE RV Basal diam:  3.20 cm RV S prime:     21.40 cm/s TAPSE (M-mode): 1.9 cm  LEFT ATRIUM             Index        RIGHT ATRIUM           Index LA diam:        3.20 cm 1.46 cm/m   RA Area:     20.60 cm LA Vol (A2C):   79.7 ml 36.35 ml/m  RA Volume:   49.40 ml  22.53 ml/m LA Vol (A4C):   88.2 ml 40.23 ml/m LA Biplane Vol: 85.3 ml 38.91 ml/m AORTIC VALVE AV Area (Vmax):    2.55 cm AV Area (Vmean):   2.29 cm AV Area (VTI):     2.28 cm AV Vmax:           141.00 cm/s AV Vmean:          104.000 cm/s AV VTI:            0.210 m AV Peak Grad:      8.0 mmHg AV Mean Grad:      5.0 mmHg LVOT Vmax:         94.70 cm/s LVOT Vmean:         62.650 cm/s LVOT VTI:          0.126 m LVOT/AV VTI ratio: 0.60  AORTA Ao Root diam: 4.00 cm Ao Asc diam:  4.10 cm  TRICUSPID VALVE TR Peak grad:   28.7 mmHg TR Vmax:        268.00 cm/s  SHUNTS Systemic VTI:  0.13 m Systemic Diam: 2.20 cm  Armanda Magic MD Electronically signed by Armanda Magic MD Signature Date/Time: 02/07/2023/1:17:32 PM    Final             Assessment & Plan   Paroxsymal Atrial fibrillation and acute on chronic heart failure complicated by resolving septic shock Cardiac arrest Aspiration PNA with high suspicion of re aspiration SBO with Ex Lap and SBO ~14-15 minute PEA arrest - Code Blue Leader has called family; they are on the way - I do not suspect this being cardiac mediated, in the setting of bradycardia can hold amiodarone today (I have  held) - LVEF has likely further declined post arrest - Discussed with TRH and PCCM teams; primary cardiologist has been discussing goals of care - per 9/12 note: Daughter was working to coordinate meeting with 3 adult children, wife, and PC; that has been planned for the coming week - in the setting of the PEA arrest, his baseline debility and wheelchair dependence, his TAA (abdominal and mild aortic dilation), this may need to be expedited - if no change in GOC, we can repeat a limited echo (I have not ordered this at this time)  CRITICAL CARE Performed by: Bradden Tadros A Jeremy Mclamb  Total critical care time: 30 minutes. Critical care time was exclusive of separately billable procedures and treating other patients. Critical care was necessary to treat or prevent imminent or life-threatening deterioration. Critical care was time spent personally by me on the following activities: development of treatment plan with patient and/or surrogate as well as nursing, discussions with consultants, evaluation of patient's response to treatment, examination of patient, obtaining history from patient or surrogate, ordering and  performing treatments and interventions, ordering and review of laboratory studies, ordering and review of radiographic studies, pulse oximetry and re-evaluation of patient's condition.    Signed, Riley Lam, MD FASE [facility] [place]  [facility]  03/18/2023 1:29 PM

## 2023-03-18 NOTE — Progress Notes (Addendum)
eLink Physician-Brief Progress Note Patient Name: Stephen Flynn DOB: 08/26/1927 MRN: [id number]   Date of Service  03/18/2023  HPI/Events of Note  Notified of hyperglycemia with glucose at 185.   eICU Interventions  Low dose sliding scale q4hrs ordered.      Intervention Category Intermediate Interventions: Hyperglycemia - evaluation and treatment  Larinda Buttery 03/18/2023, 8:45 PM  10:20 PM ABG ordered.  5:38 AM Received query regarding anticoagulation.  Pt was on heparin gtt prior to her code.   Plan> Will defer to the rounding team decision regarding anticoagulation.  ABG could not be done as attempts were unsuccessful. FiO2 titrated based on pulse ox and last ABG.

## 2023-03-18 NOTE — Progress Notes (Signed)
   03/18/23 1245  Spiritual Encounters  Type of Visit Initial  Care provided to: Family;Friend  Conversation partners present during encounter Nurse  Referral source Code page  Reason for visit Code  OnCall Visit Yes  Spiritual Framework  Presenting Themes Impactful experiences and emotions  Community/Connection Friend(s);Family;Spiritual leader;Faith community  Patient Stress Factors Health changes  Family Stress Factors Major life changes  Interventions  Spiritual Care Interventions Made Established relationship of care and support;Compassionate presence;Normalization of emotions;Encouragement  Intervention Outcomes  Outcomes Connection to spiritual care   Chaplain responded to code blue page. Patient was moved to [facility] ICU. Chaplain met with patient's spiritual leader. Chaplain escorted them to the front entrance to meet family when they arrived. Chaplain escorted family and spiritual leaders to [facility] waiting room. Chaplain let family know that they would be able to visit with the patient and that someone would notify them when they would be able to come back.   Arlyce Dice, Chaplain Resident [phone]

## 2023-03-18 NOTE — Progress Notes (Signed)
Blue code activated. MD Pokhrel notified. Cardiology MD paged. Rapid response present.

## 2023-03-18 NOTE — Progress Notes (Signed)
Change in condition:  Notified by CCMD - pt bradycardic.   Pt unresponsive to voice/sternal stimulation.  Notified RR & charge nurse.   Code activated @ 1236 ROSC @ 1251 Transferred to ICU

## 2023-03-18 NOTE — IPAL (Signed)
  Interdisciplinary Goals of Care Family Meeting   Date carried out: 03/18/2023  Location of the meeting: Bedside  Member's involved: Physician, Bedside Registered Nurse, and Family Member or next of kin  Durable Power of Attorney or acting medical decision maker: Wife and Children   Discussion: We discussed goals of care for [employer] .  We reviewed the events that occurred today with the cardiac arrest and resuscitative efforts. We discussed the concern for anoxic brain injury and will be evaluating with CT head scans and neurologic exams. I recommended changing code status to DNR with continuing current level of care to get more information about his current condition. Family has agreed to DNR status with on going acute care.   Code status:   Code Status: Do not attempt resuscitation (DNR) PRE-ARREST INTERVENTIONS DESIRED   Disposition: Continue current acute care  Time spent for the meeting: 15 minutes    Martina Sinner, MD  03/18/2023, 4:10 PM

## 2023-03-18 NOTE — Significant Event (Addendum)
Rapid Response Event Note   Reason for Call :  Unresponsive, bradycardic; code blue initiated immediately after notifying Rapid RN d/t no pulse  Initial Focused Assessment/Interventions: Upon arrival to room compressions underway (began 1236). Emesis present. Lungs/airway audibly coarse/rhonchus without stethescope. ACLS protocol followed--see Code Blue documentation. Noted patients pupils 8, nonreactive prior to intubation. Post intubation copious pink frothy secretions. ROSC achieved 1251.   Plan of Care:  Patient transferred to 2H06 on zoll monitor, family en route and updated per MD.   Event Summary:  MD Notified:  Call Time: 1234 Arrival Time: 1236 End Time:  Truddie Crumble, RN

## 2023-03-19 DIAGNOSIS — K56609 Unspecified intestinal obstruction, unspecified as to partial versus complete obstruction: Secondary | ICD-10-CM | POA: Diagnosis not present

## 2023-03-19 LAB — CBC
HCT: 26.9 % — ABNORMAL LOW (ref 39.0–52.0)
Hemoglobin: 8.3 g/dL — ABNORMAL LOW (ref 13.0–17.0)
MCH: 31.1 pg (ref 26.0–34.0)
MCHC: 30.9 g/dL (ref 30.0–36.0)
MCV: 100.7 fL — ABNORMAL HIGH (ref 80.0–100.0)
Platelets: 202 10*3/uL (ref 150–400)
RBC: 2.67 MIL/uL — ABNORMAL LOW (ref 4.22–5.81)
RDW: 17.5 % — ABNORMAL HIGH (ref 11.5–15.5)
WBC: 5.6 10*3/uL (ref 4.0–10.5)
nRBC: 0 % (ref 0.0–0.2)

## 2023-03-19 LAB — BASIC METABOLIC PANEL
Anion gap: 5 (ref 5–15)
BUN: 55 mg/dL — ABNORMAL HIGH (ref 8–23)
CO2: 26 mmol/L (ref 22–32)
Calcium: 8.3 mg/dL — ABNORMAL LOW (ref 8.9–10.3)
Chloride: 108 mmol/L (ref 98–111)
Creatinine, Ser: 1.21 mg/dL (ref 0.61–1.24)
GFR, Estimated: 56 mL/min — ABNORMAL LOW (ref >60)
Glucose, Bld: 219 mg/dL — ABNORMAL HIGH (ref 70–99)
Potassium: 4.1 mmol/L (ref 3.5–5.1)
Sodium: 139 mmol/L (ref 135–145)

## 2023-03-19 LAB — CULTURE, BLOOD (ROUTINE X 2)
Culture: NO GROWTH
Culture: NO GROWTH

## 2023-03-19 LAB — HEPARIN LEVEL (UNFRACTIONATED)
Heparin Unfractionated: <0.1 [IU]/mL — ABNORMAL LOW (ref 0.30–0.70)
Heparin Unfractionated: 0.99 [IU]/mL — ABNORMAL HIGH (ref 0.30–0.70)

## 2023-03-19 LAB — MRSA NEXT GEN BY PCR, NASAL: MRSA by PCR Next Gen: NOT DETECTED

## 2023-03-19 LAB — GLUCOSE, CAPILLARY
Glucose-Capillary: 181 mg/dL — ABNORMAL HIGH (ref 70–99)
Glucose-Capillary: 150 mg/dL — ABNORMAL HIGH (ref 70–99)
Glucose-Capillary: 99 mg/dL (ref 70–99)
Glucose-Capillary: 101 mg/dL — ABNORMAL HIGH (ref 70–99)
Glucose-Capillary: 81 mg/dL (ref 70–99)
Glucose-Capillary: 94 mg/dL (ref 70–99)

## 2023-03-19 LAB — MAGNESIUM: Magnesium: 2.3 mg/dL (ref 1.7–2.4)

## 2023-03-19 LAB — TRIGLYCERIDES: Triglycerides: 316 mg/dL — ABNORMAL HIGH (ref <150)

## 2023-03-19 MED ORDER — POLYETHYLENE GLYCOL 3350 17 G PO PACK
17.0000 g | PACK | Freq: Every day | ORAL | Status: DC
  Administered 2023-03-19: 17 g
  Filled 2023-03-19: qty 1

## 2023-03-19 MED ORDER — HEPARIN (PORCINE) 25000 UT/250ML-% IV SOLN
1750.0000 [IU]/h | INTRAVENOUS | Status: DC
  Administered 2023-03-19: 1200 [IU]/h via INTRAVENOUS
  Administered 2023-03-20: 1350 [IU]/h via INTRAVENOUS
  Administered 2023-03-20: 1650 [IU]/h via INTRAVENOUS
  Administered 2023-03-21 – 2023-03-22 (×3): 1750 [IU]/h via INTRAVENOUS
  Filled 2023-03-19 (×5): qty 250

## 2023-03-19 MED ORDER — FREE WATER
300.0000 mL | Freq: Four times a day (QID) | Status: DC
  Administered 2023-03-19 – 2023-03-20 (×4): 300 mL

## 2023-03-19 MED ORDER — FENTANYL 2500MCG IN NS 250ML (10MCG/ML) PREMIX INFUSION
25.0000 ug/h | INTRAVENOUS | Status: DC
  Administered 2023-03-19: 50 ug/h via INTRAVENOUS
  Administered 2023-03-20: 100 ug/h via INTRAVENOUS
  Administered 2023-03-22: 150 ug/h via INTRAVENOUS
  Administered 2023-03-23: 100 ug/h via INTRAVENOUS
  Filled 2023-03-19 (×4): qty 250

## 2023-03-19 MED ORDER — INSULIN ASPART 100 UNIT/ML IJ SOLN
0.0000 [IU] | INTRAMUSCULAR | Status: DC
  Administered 2023-03-20: 2 [IU] via SUBCUTANEOUS

## 2023-03-19 MED ORDER — DOCUSATE SODIUM 50 MG/5ML PO LIQD
100.0000 mg | Freq: Two times a day (BID) | ORAL | Status: DC
  Administered 2023-03-19: 100 mg
  Filled 2023-03-19: qty 10

## 2023-03-19 MED ORDER — ALBUMIN HUMAN 25 % IV SOLN
12.5000 g | Freq: Four times a day (QID) | INTRAVENOUS | Status: AC
  Administered 2023-03-19 (×3): 12.5 g via INTRAVENOUS
  Filled 2023-03-19 (×3): qty 50

## 2023-03-19 MED ORDER — FENTANYL CITRATE PF 50 MCG/ML IJ SOSY
25.0000 ug | PREFILLED_SYRINGE | Freq: Once | INTRAMUSCULAR | Status: AC
  Administered 2023-03-19: 25 ug via INTRAVENOUS
  Filled 2023-03-19: qty 1

## 2023-03-19 MED ORDER — FENTANYL BOLUS VIA INFUSION
25.0000 ug | INTRAVENOUS | Status: DC | PRN
  Administered 2023-03-19 – 2023-03-20 (×4): 50 ug via INTRAVENOUS
  Administered 2023-03-20: 25 ug via INTRAVENOUS
  Administered 2023-03-20 – 2023-03-21 (×2): 50 ug via INTRAVENOUS
  Administered 2023-03-21: 25 ug via INTRAVENOUS
  Administered 2023-03-21 (×2): 50 ug via INTRAVENOUS
  Administered 2023-03-22: 25 ug via INTRAVENOUS
  Administered 2023-03-22: 50 ug via INTRAVENOUS

## 2023-03-19 MED ORDER — POLYETHYLENE GLYCOL 3350 17 G PO PACK: 17.0000 g | PACK | Freq: Every day | ORAL | Status: DC | PRN

## 2023-03-19 MED ORDER — FENTANYL CITRATE PF 50 MCG/ML IJ SOSY
12.5000 ug | PREFILLED_SYRINGE | INTRAMUSCULAR | Status: DC | PRN
  Administered 2023-03-19: 12.5 ug via INTRAVENOUS
  Filled 2023-03-19: qty 1

## 2023-03-19 MED ORDER — DOCUSATE SODIUM 50 MG/5ML PO LIQD: 100.0000 mg | Freq: Two times a day (BID) | ORAL | Status: DC | PRN

## 2023-03-19 MED ORDER — VITAL AF 1.2 CAL PO LIQD
1000.0000 mL | ORAL | Status: DC
  Administered 2023-03-19: 1000 mL

## 2023-03-19 MED ORDER — POTASSIUM CHLORIDE 20 MEQ PO PACK
40.0000 meq | PACK | Freq: Once | ORAL | Status: AC
  Administered 2023-03-19: 40 meq
  Filled 2023-03-19: qty 2

## 2023-03-19 MED ORDER — FUROSEMIDE 10 MG/ML IJ SOLN
40.0000 mg | Freq: Four times a day (QID) | INTRAMUSCULAR | Status: AC
  Administered 2023-03-19 (×3): 40 mg via INTRAVENOUS
  Filled 2023-03-19 (×3): qty 4

## 2023-03-19 MED ORDER — VITAL AF 1.2 CAL PO LIQD: 1000.0000 mL | ORAL | Status: DC

## 2023-03-19 NOTE — Consult Note (Signed)
WOC Nurse Consult Note: Reason for Consult:Ruptured serum filled blister to left anterior lower arm in the setting of anasarca.  Wound VAC to midline incision to be stopped today.  Wound type:inflammatory- ruptured blister.  Pressure Injury POA: NA Measurement: 2 areas that connect. Medial aspect 2 cm x 2 cm x 0.1 cm joins with lateral area 3 cm x 4 cm  x 0.1 cm  Wound WUJ:WJXB and moist, serous effluent.  Drainage (amount, consistency, odor) serous  no odor Periwound: edema to both arms.   Dressing procedure/placement/frequency: Cleanse left arm blistering with NS and pat dry. Apply Xeroform to wound bed, cover with foam dressing.  Change daily.  Will not follow at this time.  Please re-consult if needed.  Mike Gip MSN, RN, FNP-BC CWON Wound, Ostomy, Continence Nurse Outpatient [facility] [phone] Pager [phone]

## 2023-03-19 NOTE — Progress Notes (Signed)
SLP Cancellation Note  Patient Details Name: Stephen Flynn MRN: [id number] DOB: 16-Jan-1928   Cancelled treatment:       Reason Eval/Treat Not Completed: Medical issues which prohibited therapy (pt on vent after code blue on previous date). Will f/u as able.     Mahala Menghini., M.A. CCC-SLP Acute Rehabilitation Services Office [phone]  Secure chat preferred  03/19/2023, 7:35 AM

## 2023-03-19 NOTE — Progress Notes (Signed)
Grimacing w/ pain  Still awaiting CT imaging.  So far less than out since this am. Has received only one dose of the albumin and lasix  Plan Will change him back to low dose fent gtt for pain as we are likely more than 24 hrs away from having him optimized for extubation  Simonne Martinet ACNP-BC [facility] Pulmonary/Critical Care Pager # [phone] OR # [phone] if no answer

## 2023-03-19 NOTE — Progress Notes (Signed)
Physical Therapy RE-evaluation  Patient Details Name: Stephen Flynn MRN: [id number] DOB: Jan 14, 1928 Today's Date: 03/19/2023   History of Present Illness 89 y/o male admitted 8/25 with abdominal pain, SOB with bowel obstruction. 2023/03/11 developed hypotension and required ex-lap with small bowel resection, remained intubated post op. Extubated 8/27. Reintubated 9/1-9/6. 9/9 with R head turn and decr cognition noted from therapist but MRI negative. Pt coded March 31, 2023, re-intubated. PMhx: GIB, stroke, HTN, Afib, neuropathy, CHF, obesity    PT Comments  Pt with code blue 03-31-23 and is not back on the vent. Pt with L gaze preference, inconsistent simple command follow, and requires totalA for bed mobility and to transfer to EOB. Pt with soft BP with noted orthostasis. Unsure of medical prognosis. Pt was indep with use of RW PTA and lived with spouse. At this time due to medical and functional decline recommending inpatient rehab program <3 hrs/day to maximize functional return. Acute PT to cont to follow.    If plan is discharge home, recommend the following: Two people to help with walking and/or transfers;Two people to help with bathing/dressing/bathroom;Assist for transportation;Assistance with cooking/housework;Help with stairs or ramp for entrance;Supervision due to cognitive status   Can travel by private vehicle     No  Equipment Recommendations  Other (comment) (TBD)    Recommendations for Other Services Other (comment)     Precautions / Restrictions Precautions Precautions: Fall;Other (comment) Precaution Comments: cortrak, vent Restrictions Weight Bearing Restrictions: No     Mobility  Bed Mobility Overal bed mobility: Needs Assistance Bed Mobility: Rolling, Supine to Sit, Sit to Supine Rolling: Total assist, Used rails Sidelying to sit: Total assist, +2 for physical assistance, +2 for safety/equipment Supine to sit: Total assist, HOB elevated, Used rails Sit to supine: Total assist    General bed mobility comments: pt with no initiation of task despite max verbal and tactile cues    Transfers                   General transfer comment: unable to transfer OOB    Ambulation/Gait               General Gait Details: unable   Stairs             Wheelchair Mobility     Tilt Bed    Modified Rankin (Stroke Patients Only)       Balance Overall balance assessment: Needs assistance Sitting-balance support: Feet unsupported, Bilateral upper extremity supported Sitting balance-Leahy Scale: Zero Sitting balance - Comments: pt required total A for static sitting balance due to heavy posterior lean however did progress to minA at times. Postural control: Posterior lean                                  Cognition Arousal: Lethargic Behavior During Therapy: Flat affect Overall Cognitive Status: Difficult to assess Area of Impairment: Attention, Following commands, Safety/judgement, Problem solving                   Current Attention Level: Sustained   Following Commands: Follows one step commands with increased time, Follows one step commands inconsistently Safety/Judgement: Decreased awareness of deficits, Decreased awareness of safety Awareness: Emergent Problem Solving: Requires verbal cues, Requires tactile cues General Comments: Pt easily woken and alert. Pt able to follow simple one step commands inconsistantly while in supine, upon sitting EOB pt unable to follow simple one step commands, getting  distracted, and with poor attention. noted drop in BP while EOB        Exercises      General Comments General comments (skin integrity, edema, etc.): VSS on vent, BP did drop to 74/55 once EOB, returned to 90/55 once in supine, pt with noted edema t/o entire body, including scrotum      Pertinent Vitals/Pain Pain Assessment Pain Assessment: Faces Faces Pain Scale: Hurts a little bit Pain Location: generalized  with mobility Pain Descriptors / Indicators: Discomfort, Sore, Grimacing, Moaning    Home Living                          Prior Function            PT Goals (current goals can now be found in the care plan section) Acute Rehab PT Goals PT Goal Formulation: With patient/family Time For Goal Achievement: 03/22/23 Potential to Achieve Goals: Fair Progress towards PT goals: Not progressing toward goals - comment (pt back on vent)    Frequency    Min 1X/week      PT Plan      Co-evaluation PT/OT/SLP Co-Evaluation/Treatment: Yes Reason for Co-Treatment: Complexity of the patient's impairments (multi-system involvement);For patient/therapist safety PT goals addressed during session: Mobility/safety with mobility;Strengthening/ROM OT goals addressed during session: ADL's and self-care;Strengthening/ROM      AM-PAC PT "6 Clicks" Mobility   Outcome Measure  Help needed turning from your back to your side while in a flat bed without using bedrails?: Total Help needed moving from lying on your back to sitting on the side of a flat bed without using bedrails?: Total Help needed moving to and from a bed to a chair (including a wheelchair)?: Total Help needed standing up from a chair using your arms (e.g., wheelchair or bedside chair)?: Total Help needed to walk in hospital room?: Total Help needed climbing 3-5 steps with a railing? : Total 6 Click Score: 6    End of Session   Activity Tolerance: Patient limited by fatigue;Other (comment) (vent) Patient left: in bed;with call bell/phone within reach;with bed alarm set Nurse Communication: Mobility status PT Visit Diagnosis: Other abnormalities of gait and mobility (R26.89);Muscle weakness (generalized) (M62.81)     Time: 1610-9604 PT Time Calculation (min) (ACUTE ONLY): 28 min  Charges:      PT General Charges $$ ACUTE PT VISIT: 1 Visit                     Lewis Shock, PT, DPT Acute Rehabilitation  Services Secure chat preferred Office #: [phone]    Iona Hansen 03/19/2023, 2:21 PM

## 2023-03-19 NOTE — Progress Notes (Signed)
Nutrition Follow-up  DOCUMENTATION CODES:   Non-severe (moderate) malnutrition in context of chronic illness  INTERVENTION:   Tube Feeding via Cortrak:  Change to Vital AF 1.2 with goal rate of 65 ml/hr Begin TF at rate of 30 ml/hr, titrate by 10 mL q 8 hours until goal rate of 65 ml/hr TF at goal provides 1872 kcals, 117 g of protein and 1264 mL of free water  Due to the length of remaining bowel (only 120 cm remaining), pt is at high risk for short bowel syndrome and will likely benefit from long term TPN. Continue to monitor for signs/symptoms of malabsorption  NUTRITION DIAGNOSIS:   Moderate Malnutrition related to chronic illness as evidenced by mild fat depletion, severe muscle depletion.  Continues  GOAL:   Patient will meet greater than or equal to 90% of their needs  Not Met but progressing  MONITOR:   I & O's, Vent status, Labs, Weight trends  REASON FOR ASSESSMENT:   Consult Enteral/tube feeding initiation and management (gradual advancement as tolerated)  ASSESSMENT:   Pt with hx of HTN, HLD, CVA, CHF, DM type 2, atrial fibrillation, and AAA presented to ED with abdominal pain. Imaging showed a SBO. Initially treated conservatively but ultimately developed hypotension and was transferred to ICU and then taken to OR for repair.  8/25 - admitted with SBO 8/26 - Op, exploratory laparotomy with bowel resection, Small bowel was resected from the distal jejunum to the distal ileum. ~120cm of remaining bowel. PICC placed 8/27 - TPN initiated, extubated 8/28 - wound vac placed 8/30 - cortrak placed (extreme distal stomach or proximal duodenum); TF started at trickle rate 8/31 - TF increased to 17mL/hr 9/1 - Re-intubated; TF increased to 69mL/hr 9/2 - TF held due to distension and no bowel function 9/4 - TF started at trickle rate  9/6 - extubated 9/8 - Cortrak accidentally pulled back to gastric position, MD ok with continuing TF 9/15 - Cardiac Arrest, 15  minutes CPR, intubated  9/11 - Cortrak re-advanced to post pyloric position  Pt with only 120 cm bowel remaining, high risk for SBS. Noted flexiseal placed Friday due to diarrhea with addition of Banatrol BID and Imodium BID. Monitor stool consistency and output  Noted TF via post pyloric tube resumed post intubation at 20 ml/hr with plans to titrate starting this afternoon towards goal  Wound VAC to abd incision removed today  UOP marginal with 1.2 L in 24 hours; 480 mL thus far today  Labs: sodium 139 (wdl), Creatinine wdl, BUN 55 Meds: miralax, banatrol BID  Diet Order:   Diet Order     None       EDUCATION NEEDS:   Not appropriate for education at this time  Skin:  Skin Assessment: Reviewed RN Assessment (surgical incision, midline)  Last BM:  9/8 - type 6  Height:   Ht Readings from Last 1 Encounters:  03/09/23 5' 9.02" (1.753 m)    Weight:   Wt Readings from Last 1 Encounters:  03/19/23 107.7 kg    Ideal Body Weight:  72.7 kg  BMI:  Body mass index is 35.05 kg/m.  Estimated Nutritional Needs:   Kcal:  1800-2000 kcal/d  Protein:  90-105g/d  Fluid:  1.8L/d   Romelle Starcher MS, RDN, LDN, CNSC Registered Dietitian 3 Clinical Nutrition RD Pager and On-Call Pager Number Located in [place]

## 2023-03-19 NOTE — Progress Notes (Signed)
ANTICOAGULATION CONSULT NOTE-Follow Up  Pharmacy Consult for Heparin Indication: atrial fibrillation  Allergies  Allergen Reactions   Penicillin G     Other reaction(s): Drowsy   Penicillins Hives    Did it involve swelling of the face/tongue/throat, SOB, or low BP? No Did it involve sudden or severe rash/hives, skin peeling, or any reaction on the inside of your mouth or nose? yes Did you need to seek medical attention at a hospital or doctor's office? yes When did it last happen?  child     If all above answers are "NO", may proceed with cephalosporin use.    Patient Measurements: Height: 5' 9.02" (175.3 cm) Weight: 113.2 kg (249 lb 9 oz) IBW/kg (Calculated) : 70.74 Heparin Dosing Weight: 95 kg  Vital Signs: Temp: 95.1 F (35.1 C) (09/15 2005) Temp Source: Axillary (09/15 2005) BP: 115/74 (09/16 0415) Pulse Rate: 51 (09/16 0415)  Labs: Recent Labs    03/17/23 0606 03/18/23 0635 03/18/23 1458 03/18/23 1505 03/19/23 0328  HGB  --  7.8* 8.8* 8.6* 8.3*  HCT  --  26.1* 26.0* 28.4* 26.9*  PLT  --  205  --  165 202  HEPARINUNFRC 0.34 0.33  --   --  <0.10*  CREATININE 1.12 1.19  --  1.14 1.21    Estimated Creatinine Clearance: 47.3 mL/min (by C-G formula based on SCr of 1.21 mg/dL).   Assessment: 89 YOM s/p SBR on Xarelto prior to admission for atrial fibrillation. Plans to D/C wound VAC on Monday (9/16).  Heparin level therapeutic (0.33) on infusion at 1350 units/hr. No issues with line or bleeding reported per RN. CBC stable - Hgb 7.8, plts 205. Appears plan per cards is to transition to oral [facility] on discharge.  9/16 AM: Patient was code blue earlier in the day and transferred to [facility]. Heparin has been stopped since patient has coded and level retruend at <0.1. Hgb low, stable and plts. Per CCM, heparin restart to be decided today on rounds with cards  Goal of Therapy:  Heparin level 0.3-0.5 units/ml Monitor platelets by anticoagulation protocol: Yes   Plan:   Follow up restart of heparin at 1350 units / hr Daily heparin level and CBC F/u plans to transition to oral [facility]  Arabella Merles, PharmD. Clinical Pharmacist 03/19/2023 5:52 AM

## 2023-03-19 NOTE — Progress Notes (Signed)
ANTICOAGULATION CONSULT NOTE-Follow Up  Pharmacy Consult for Heparin Indication: atrial fibrillation  Allergies  Allergen Reactions   Penicillin G     Other reaction(s): Drowsy   Penicillins Hives    Did it involve swelling of the face/tongue/throat, SOB, or low BP? No Did it involve sudden or severe rash/hives, skin peeling, or any reaction on the inside of your mouth or nose? yes Did you need to seek medical attention at a hospital or doctor's office? yes When did it last happen?  child     If all above answers are "NO", may proceed with cephalosporin use.    Patient Measurements: Height: 5' 9.02" (175.3 cm) Weight: 107.7 kg (237 lb 7 oz) IBW/kg (Calculated) : 70.74 Heparin Dosing Weight: 95 kg  Vital Signs: Temp: 97.6 F (36.4 C) (09/16 0800) Temp Source: Oral (09/16 0800) BP: 104/63 (09/16 1100) Pulse Rate: 59 (09/16 1100)  Labs: Recent Labs    03/17/23 0606 03/18/23 0635 03/18/23 1458 03/18/23 1505 03/19/23 0328  HGB  --  7.8* 8.8* 8.6* 8.3*  HCT  --  26.1* 26.0* 28.4* 26.9*  PLT  --  205  --  165 202  HEPARINUNFRC 0.34 0.33  --   --  <0.10*  CREATININE 1.12 1.19  --  1.14 1.21    Estimated Creatinine Clearance: 46.1 mL/min (by C-G formula based on SCr of 1.21 mg/dL).   Assessment: 89 YOM s/p SBR on Xarelto prior to admission for atrial fibrillation. Plans to D/C wound VAC on Monday (9/16).  Heparin stopped after code blue event yesterday, resumed this morning.  No overt bleeding or complications noted.  Goal of Therapy:  Heparin level 0.3-0.5 units/ml Monitor platelets by anticoagulation protocol: Yes   Plan:  Continue heparin at 1350 units / hr Check heparin level 8 hrs after resumption. Daily heparin level and CBC F/u plans to transition to oral [facility]   Reece Leader, Loura Back, BCPS, [facility] Clinical Pharmacist  03/19/2023 11:21 AM   [facility] pharmacy phone numbers are listed on [email or web address]

## 2023-03-19 NOTE — Progress Notes (Addendum)
NAMEWale Flynn, MRN:  [id number], DOB:  10/23/1927, LOS: 22 ADMISSION DATE:  02/04/2023, CONSULTATION DATE: 28-Mar-2023 REFERRING MD: Dr. Randol Kern, CHIEF COMPLAINT: Hypotension  History of Present Illness:  Patient is encephalopathic and/or intubated; therefore, history has been obtained from chart review.  89 year old male with past medical history as below, which is significant for atrial fibrillation on Eliquis, wheelchair-bound, HFpEF, and stroke who presented to [facility] emergency department on 8/25 with complaints of acute onset abdominal pain with no bowel movement for 3 days prior to presentation.  Also sounds like he had not been consistently taking his Eliquis.  Upon arrival to the emergency department the patient was noted to be in atrial fibrillation with RVR to the 120s.  Started on diltiazem infusion and placed on heparin drip.  CT of the abdomen and pelvis demonstrated small bowel obstruction and moderate ascites.  General surgery was consulted and recommended conservative management with NG tube decompression.  He was admitted to the hospitalist service.  In the a.m. hours of 03/28/2023 the patient developed waning mental status and became nonverbal.  He also developed hypotension with systolic pressures in the 70s and desaturation to the 80s.  His abdomen became more firm and rigid and exquisitely tender to pain.  The surgical team was at bedside and determined the patient would likely require urgent surgery.  PCCM was consulted for hypotension and hypoxia.  Pertinent  Medical History   has a past medical history of Anemia, Aortic insufficiency, Chest pain, H/O: GI bleed, Hyperlipidemia, Hypertension, Leg cramps, and Stroke (HCC).   Significant Hospital Events: Including procedures, antibiotic start and stop dates in addition to other pertinent events   8/25 admit for SBO Mar 28, 2023 acute abdomen, tx to ICU for pressors. PICC line placed. Underwent Ex lap and small bowel  resection 8/27 Extubated 9/1 Reintubated > bronchoscopy, BAL >> few yeast 9/5 1u pRBC for Hgb 6.6 without signs of bleeding   9/6 extubated  9/10 transferred out of ICU and to [facility]  9/15 bradycardic to PEA arrest ~ down time, pupils 8 and fixed post arrest 9/16 awake, tolerating higher levels of pressure support.  Following commands with both extremities he is profoundly debilitated  Interim History / Subjective:  Awake, will follow commands  Objective   Blood pressure (!) 85/58, pulse (!) 57, temperature (!) 95.1 F (35.1 C), temperature source Axillary, resp. rate (!) 26, height 5' 9.02" (1.753 m), weight 107.7 kg, SpO2 100%.      Vent Mode: PRVC FiO2 (%):  [50 %-100 %] 50 % Set Rate:  [16 bmp-26 bmp] 26 bmp Vt Set:  [560 mL] 560 mL PEEP:  [8 cmH20] 8 cmH20 Plateau Pressure:  [30 cmH20] 30 cmH20   Intake/Output Summary (Last 24 hours) at 03/19/2023 0800 Last data filed at 03/19/2023 0700 Gross per 24 hour  Intake 4733.73 ml  Output 1690 ml  Net 3043.73 ml   Filed Weights   03/16/23 0409 03/18/23 0549 03/19/23 0500  Weight: 106.7 kg 113.2 kg 107.7 kg    Examination: General This is a 89 year old male patient is lying in bed status post cardiac arrest.  He is awake, tracks, follows commands, currently on pressure support ventilation but profoundly weak Pulmonary: Scattered rhonchi no accessory use, currently on pressure support of 15 with tidal volumes in the mid 300 range no accessory use on the setting Pcxr form 9/15 w/ L>r airspace disease  Cardiac: Bradycardic regular rhythm heart rate in the 50s stopping  amiodarone Abdomen tolerating tube feeds currently, wound VAC intact GU clear yellow urine via Foley Neuro awake, tracks, moves upper extremities but very weak.  Not able to lift against gravity, able to squeeze hands both sides feel equal.  Not able to move lower extremities Extremities warm dry diffuse anasarca  Resolved Hospital Problem list    AKI Hypernatremia  In hospital bradycardic to PEA arrest. (15 minute down time) Assessment & Plan:   HFrEF (40-45% w/ global hypokinesis now s/p in house cardiac arrest 9/15 w/drug related hypotension plan Cont tele  Stop sedating gtts Wean NE for SBP > 100  Holding GDMT given hypotension  Wean NE for MAP > 65 Keep euvolemic   Paroxysmal atrial fibrillation  Plan Off amio given recent bradycardic arrest  Cont tele  Cont IV heparin for now  Acute Hypoxic Respiratory Failure due to cardiopulmonary arrest c/b Recurrent aspiration pneumonia  Sputum 9/15 w/ rare gpc, wbc stable, max temp 98.1 Plan Day 2 ctx and flagyl; can stop flagyl  F/u pending cultures get MRSA swab w/ GPC prelim findings Wean Sedation for goal RASS 0--1  VAP bundle  PSV as tolerated today. Need to get volume off prior to extubation  Trying albumin f/b lasix to mobilize fluid   Small bowel obstruction s/p ex lap and small bowel resection March 14, 2023 Plan Primary management per CCS  Postpyloric tube feeds  Optimize nutrition as able  Wound vac per CCS  Acute metabolic encephalopathy w/ Concern for anoxic brain injury s/p cardiac arrest Fixed dilated pupil s/p arrest ->this has resolved  Plan Maintain neuro protective measures; Avoid fever Keep euglycemic  Keep normocapnic  Asp precautions  F/u CT head  Mild AKI s/p cardiac arrest Plan Trend chems Renal dose meds Strict I&O  Anemia, due to critical illness superimposed on iron deficiency anemia  Hgb stable Plan Trend CBC  Transfuse per protocol Hgb goal > 7  Diabetes mellitus, controlled Hemoglobin A1c 6.5 Plan Cont ssi goal 140-180  Can increased to mod scale as a little over goal w/ sensitive   Moderate malnutrition Baseline debility: Wheelchair-bound Plan Cont tubefeeds RD following   Best Practice (right click and "Reselect all SmartList Selections" daily)   Diet/type: NPO  TF DVT prophylaxis: Systemic heparin GI prophylaxis:  N/A Lines: N/A Foley:  N/A Code Status:  full code Last date of multidisciplinary goals of care discussion Pending post arrest   My cct 40 min  03/19/2023, 8:00 AM

## 2023-03-19 NOTE — Progress Notes (Signed)
Family updated on current plan of care   Simonne Martinet ACNP-BC [facility] Pulmonary/Critical Care Pager # [phone] OR # [phone] if no answer

## 2023-03-19 NOTE — Progress Notes (Signed)
ANTICOAGULATION CONSULT NOTE-Follow Up  Pharmacy Consult for Heparin Indication: atrial fibrillation  Allergies  Allergen Reactions   Penicillin G     Other reaction(s): Drowsy   Penicillins Hives    Did it involve swelling of the face/tongue/throat, SOB, or low BP? No Did it involve sudden or severe rash/hives, skin peeling, or any reaction on the inside of your mouth or nose? yes Did you need to seek medical attention at a hospital or doctor's office? yes When did it last happen?  child     If all above answers are "NO", may proceed with cephalosporin use.    Patient Measurements: Height: 5' 9.02" (175.3 cm) Weight: 107.7 kg (237 lb 7 oz) IBW/kg (Calculated) : 70.74 Heparin Dosing Weight: 95 kg  Vital Signs: Temp: 98.1 F (36.7 C) (09/16 1900) Temp Source: Oral (09/16 1900) BP: 108/66 (09/16 1900) Pulse Rate: 64 (09/16 1900)  Labs: Recent Labs    03/18/23 0635 03/18/23 1458 03/18/23 1505 03/19/23 0328 03/19/23 1838  HGB 7.8* 8.8* 8.6* 8.3*  --   HCT 26.1* 26.0* 28.4* 26.9*  --   PLT 205  --  165 202  --   HEPARINUNFRC 0.33  --   --  <0.10* 0.99*  CREATININE 1.19  --  1.14 1.21  --     Estimated Creatinine Clearance: 46.1 mL/min (by C-G formula based on SCr of 1.21 mg/dL).   Assessment: 89 YOM s/p SBR on Xarelto prior to admission for atrial fibrillation. Plans to D/C wound VAC on Monday (9/16).  Heparin stopped after code blue event yesterday, resumed this morning.  No overt bleeding or complications noted.  PM: heparin level 0.99 - fairly certain level was drawn through PICC, unable to verify technique. No bleeding complications reported per RN.  Goal of Therapy:  Heparin level 0.3-0.5 units/ml Monitor platelets by anticoagulation protocol: Yes   Plan:  Hold heparin drip x 30 minutes Decrease heparin to 1200 units / hr Check heparin level 8 hrs after resumption. Daily heparin level and CBC F/u plans to transition to oral [facility]   Loralee Pacas,  PharmD, BCPS  03/19/2023 8:08 PM   Please check AMION for all [facility] Pharmacy phone numbers After 10:00 PM, call Main Pharmacy [phone]

## 2023-03-19 NOTE — Progress Notes (Addendum)
Occupational Therapy Treatment Patient Details Name: Stephen Flynn MRN: [id number] DOB: 09-Jul-1927 Today's Date: 03/19/2023   History of present illness 89 y/o male admitted 2023-03-24 with abdominal pain, SOB with bowel obstruction. 8/26 developed hypotension and required ex-lap with small bowel resection, remained intubated post op. Extubated 8/27. Reintubated 9/1-9/6. 9/9 with R head turn and decr cognition noted from therapist but MRI negative. Pt coded April 14, 2023, re-intubated. PMhx: GIB, stroke, HTN, Afib, neuropathy, CHF, obesity   OT comments  Pt s/p code blue event April 14, 2023, now intubated on vent. Pt total A for all ADLs and bed mobility at this time, spontaneously following simple commands and appears to move RUE  > LUE during session, used RUE to give "thumbs up". Pt not visually tracking therapist in room during session. Pt presenting with impairments listed below,w ill follow acutely. Patient will benefit from continued inpatient follow up therapy, <3 hours/day to maximize safety/ind with ADLs/functional mobility.  BP seated EOB 74/53 (61) BP supine 90/55 (67)       If plan is discharge home, recommend the following:  Two people to help with walking and/or transfers;Two people to help with bathing/dressing/bathroom;Assistance with cooking/housework;Assistance with feeding;Direct supervision/assist for medications management;Direct supervision/assist for financial management;Assist for transportation;Help with stairs or ramp for entrance   Equipment Recommendations  Other (comment) (defer)    Recommendations for Other Services PT consult    Precautions / Restrictions Precautions Precautions: Fall;Other (comment) Precaution Comments: cortrak, vent Restrictions Weight Bearing Restrictions: No       Mobility Bed Mobility Overal bed mobility: Needs Assistance Bed Mobility: Rolling, Supine to Sit, Sit to Supine Rolling: Total assist, Used rails Sidelying to sit: Total assist, +2 for  physical assistance, +2 for safety/equipment Supine to sit: Total assist, HOB elevated, Used rails Sit to supine: Total assist        Transfers                   General transfer comment: unable to transfer OOB     Balance Overall balance assessment: Needs assistance Sitting-balance support: Feet unsupported, Bilateral upper extremity supported Sitting balance-Leahy Scale: Zero Sitting balance - Comments: pt required total A for static sitting balance due to heavy posterior lean however did progress to minA at times. Postural control: Posterior lean                                 ADL either performed or assessed with clinical judgement   ADL Overall ADL's : Needs assistance/impaired Eating/Feeding: NPO   Grooming: Total assistance   Upper Body Bathing: Total assistance   Lower Body Bathing: Total assistance   Upper Body Dressing : Total assistance   Lower Body Dressing: Total assistance   Toilet Transfer: Total assistance   Toileting- Clothing Manipulation and Hygiene: Total assistance       Functional mobility during ADLs: Total assistance      Extremity/Trunk Assessment Upper Extremity Assessment RUE Deficits / Details: actively resisting ROM on R side, grasps therapists hand during mobility, edematous, can give "thumbs up" sign with R hand RUE Coordination: decreased fine motor;decreased gross motor LUE Deficits / Details: edematous, can grasp therapists hand 2/5, tolerates minimal PROM for positioning on pillow LUE Coordination: decreased fine motor;decreased gross motor   Lower Extremity Assessment Lower Extremity Assessment: Defer to PT evaluation        Vision   Vision Assessment?: Vision impaired- to be further tested in functional context  Additional Comments: did not track therapist this session or turn eyes toward stimuli   Perception Perception Perception: Not tested   Praxis Praxis Praxis: Not tested    Cognition  Arousal: Lethargic Behavior During Therapy: Flat affect Overall Cognitive Status: Difficult to assess Area of Impairment: Attention, Following commands, Safety/judgement, Problem solving                   Current Attention Level: Sustained   Following Commands: Follows one step commands with increased time, Follows one step commands inconsistently Safety/Judgement: Decreased awareness of deficits, Decreased awareness of safety Awareness: Emergent Problem Solving: Requires verbal cues, Requires tactile cues General Comments: Pt easily woken and alert. Pt able to follow simple one step commands inconsistantly while in supine, upon sitting EOB pt unable to follow simple one step commands, getting distracted, and with poor attention. noted drop in BP while EOB        Exercises      Shoulder Instructions       General Comments BP 74/53 ( 61) seated EOB, 90/55 (67) once returned to supine    Pertinent Vitals/ Pain       Pain Assessment Pain Assessment: Faces Pain Score: 2  Faces Pain Scale: Hurts a little bit Pain Location: generalized with mobility Pain Descriptors / Indicators: Discomfort, Sore, Grimacing, Moaning Pain Intervention(s): Limited activity within patient's tolerance, Monitored during session, Repositioned  Home Living                                          Prior Functioning/Environment              Frequency  Min 1X/week        Progress Toward Goals  OT Goals(current goals can now be found in the care plan section)  Progress towards OT goals: Goals drowngraded-see care plan  Acute Rehab OT Goals Patient Stated Goal: pt unable to state OT Goal Formulation: With patient Time For Goal Achievement: 03/22/23 Potential to Achieve Goals: Good ADL Goals Pt Will Perform Grooming: with mod assist;bed level;sitting Additional ADL Goal #1: Pt will follow 1 step commands 75% of time in prep for ADLs Additional ADL Goal #2: pt will  perfom bed mobility with mod +2 in prep for ADLs  Plan      Co-evaluation    PT/OT/SLP Co-Evaluation/Treatment: Yes Reason for Co-Treatment: Complexity of the patient's impairments (multi-system involvement);For patient/therapist safety PT goals addressed during session: Mobility/safety with mobility;Strengthening/ROM OT goals addressed during session: ADL's and self-care;Strengthening/ROM      AM-PAC OT "6 Clicks" Daily Activity     Outcome Measure   Help from another person eating meals?: Total Help from another person taking care of personal grooming?: Total Help from another person toileting, which includes using toliet, bedpan, or urinal?: Total Help from another person bathing (including washing, rinsing, drying)?: Total Help from another person to put on and taking off regular upper body clothing?: Total Help from another person to put on and taking off regular lower body clothing?: Total 6 Click Score: 6    End of Session    OT Visit Diagnosis: Unsteadiness on feet (R26.81);Muscle weakness (generalized) (M62.81);Other symptoms and signs involving cognitive function   Activity Tolerance Patient tolerated treatment well   Patient Left in bed;with call bell/phone within reach;with bed alarm set   Nurse Communication Mobility status;Other (comment)  Time: 6644-0347 OT Time Calculation (min): 24 min  Charges: OT General Charges $OT Visit: 1 Visit OT Treatments $Therapeutic Activity: 8-22 mins  Carver Fila, OTD, OTR/L SecureChat Preferred Acute Rehab [phone]   Dalphine Handing 03/19/2023, 4:26 PM

## 2023-03-19 NOTE — Progress Notes (Addendum)
21 Days Post-Op   Subjective/Chief Complaint: Moved to ICU after cardiac arrest and suspected aspiration. 15 mins of CPR. Remains intubated and is unresponsive for me this morning. On low dose levo  Objective: Vital signs in last 24 hours: Temp:  [95.1 F (35.1 C)-97.7 F (36.5 C)] 95.1 F (35.1 C) (09/15 2005) Pulse Rate:  [49-250] 57 (09/16 0700) Resp:  [0-27] 26 (09/16 0700) BP: (71-182)/(57-121) 129/69 (09/16 0822) SpO2:  [75 %-100 %] 99 % (09/16 0822) FiO2 (%):  [50 %-100 %] 50 % (09/16 0822) Weight:  [107.7 kg] 107.7 kg (09/16 0500) Last BM Date : 03/18/23  Intake/Output from previous day: 09/15 0701 - 09/16 0700 In: 4733.7 [I.V.:921.2; ZO/XW:9604.5; IV Piggyback:300] Out: 1690 [Urine:1195; Drains:50; Stool:445] Intake/Output this shift: No intake/output data recorded.  Eyes open, ill appearing, on ventilator Cortrak in place, tube feeds running Abdomen soft and distended, Wound vac in place on midline wound, serosanguinous fluid in canister.    Lab Results:  Recent Labs    03/18/23 1505 03/19/23 0328  WBC 6.1 5.6  HGB 8.6* 8.3*  HCT 28.4* 26.9*  PLT 165 202   BMET Recent Labs    03/18/23 1505 03/19/23 0328  NA 140 139  K 4.2 4.1  CL 103 108  CO2 28 26  GLUCOSE 276* 219*  BUN 54* 55*  CREATININE 1.14 1.21  CALCIUM 8.5* 8.3*   PT/INR No results for input(s): "LABPROT", "INR" in the last 72 hours.  ABG Recent Labs    03/18/23 1458  PHART 7.299*  HCO3 31.7*    Studies/Results: DG CHEST PORT 1 VIEW  Result Date: 03/18/2023 CLINICAL DATA:  Endotracheal intubation EXAM: PORTABLE CHEST 1 VIEW COMPARISON:  03/14/2023 FINDINGS: Interval placement of endotracheal tube, tip above the carina. Enteric feeding tube with tip below the diaphragm, not visualized. Cardiomegaly with diffuse bilateral interstitial pulmonary opacity and probable small layering left pleural effusion, not significantly changed. Osseous structures unremarkable. IMPRESSION: 1.  Interval placement of endotracheal tube, tip above the carina. Enteric feeding tube with tip below the diaphragm, not visualized. 2. Cardiomegaly with diffuse bilateral interstitial pulmonary opacity and probable small layering left pleural effusion, not significantly changed. No new or focal airspace opacity. Electronically Signed   By: Jearld Lesch M.D.   On: 03/18/2023 13:38    Anti-infectives: Anti-infectives (From admission, onward)    Start     Dose/Rate Route Frequency Ordered Stop   03/18/23 1400  cefTRIAXone (ROCEPHIN) 2 g in sodium chloride 0.9 % 100 mL IVPB        2 g 200 mL/hr over 30 Minutes Intravenous Every 24 hours 03/18/23 1314     03/18/23 1400  metroNIDAZOLE (FLAGYL) IVPB 500 mg        500 mg 100 mL/hr over 60 Minutes Intravenous Every 12 hours 03/18/23 1314     03/14/23 0700  cefTRIAXone (ROCEPHIN) 1 g in sodium chloride 0.9 % 100 mL IVPB  Status:  Discontinued        1 g 200 mL/hr over 30 Minutes Intravenous Daily 03/14/23 0621 03/14/23 0759   03/14/23 0630  azithromycin (ZITHROMAX) 500 mg in sodium chloride 0.9 % 250 mL IVPB  Status:  Discontinued        500 mg 250 mL/hr over 60 Minutes Intravenous Daily 03/14/23 0621 03/14/23 0759   03/04/23 1000  meropenem (MERREM) 1 g in sodium chloride 0.9 % 100 mL IVPB        1 g 200 mL/hr over 30 Minutes Intravenous Every 12  hours 03/04/23 0855 03/10/23 2132   03/01/23 1500  ceFEPIme (MAXIPIME) 2 g in sodium chloride 0.9 % 100 mL IVPB        2 g 200 mL/hr over 30 Minutes Intravenous Every 12 hours 03/01/23 1124 03/02/23 0356   03/01/23 0300  ceFEPIme (MAXIPIME) 2 g in sodium chloride 0.9 % 100 mL IVPB  Status:  Discontinued        2 g 200 mL/hr over 30 Minutes Intravenous Every 24 hours 02/28/23 1141 03/01/23 1124   02/19/2023 1445  piperacillin-tazobactam (ZOSYN) IVPB 3.375 g  Status:  Discontinued        3.375 g 100 mL/hr over 30 Minutes Intravenous Every 8 hours 02/14/2023 1436 02/20/2023 1445   02/05/2023 1445  ceFEPIme  (MAXIPIME) 2 g in sodium chloride 0.9 % 100 mL IVPB  Status:  Discontinued        2 g 200 mL/hr over 30 Minutes Intravenous Every 12 hours 02/09/2023 1446 02/28/23 1141   02/23/2023 1445  metroNIDAZOLE (FLAGYL) IVPB 500 mg        500 mg 100 mL/hr over 60 Minutes Intravenous Every 12 hours 02/19/2023 1446 03/02/23 1719   02/04/2023 1015  meropenem (MERREM) 1 g in sodium chloride 0.9 % 100 mL IVPB        1 g 200 mL/hr over 30 Minutes Intravenous  Once 02/17/2023 0917 02/19/2023 1743       Assessment/Plan: -POD#21 s/p Exploratory laparotomy small bowel resection and primary anastomosis 8/26 Dr. Luisa Hart - approximately 120 cm of small bowel remaining. Flexiseal placed Friday for diarrhea- Banatrol 60 mL BID. Imodium 2 mg BID today per tube. Remove flexi seal as soon as able - dysphagia, failed SLP Eval 9/13. Recurrent aspiration. Cardiac arrest 9/15. Now unresponsive and CTH pending to evaluate for brain anoxia.  - ok for post-pylori tube feeds - D/C wound VAC today and start daily most-to-dry dressings.  - on hep gtt for afib   Hosie Spangle, PA-C General & Trauma Surgery [facility], [place] [phone] for weekday/non holidays Check [email or web address] for coverage night/weekend/holidays

## 2023-03-20 ENCOUNTER — Inpatient Hospital Stay (HOSPITAL_COMMUNITY): Payer: 59

## 2023-03-20 DIAGNOSIS — J9601 Acute respiratory failure with hypoxia: Secondary | ICD-10-CM | POA: Diagnosis not present

## 2023-03-20 DIAGNOSIS — M7989 Other specified soft tissue disorders: Secondary | ICD-10-CM | POA: Diagnosis not present

## 2023-03-20 DIAGNOSIS — I420 Dilated cardiomyopathy: Secondary | ICD-10-CM | POA: Diagnosis not present

## 2023-03-20 DIAGNOSIS — K56609 Unspecified intestinal obstruction, unspecified as to partial versus complete obstruction: Secondary | ICD-10-CM | POA: Diagnosis not present

## 2023-03-20 DIAGNOSIS — A419 Sepsis, unspecified organism: Secondary | ICD-10-CM | POA: Diagnosis not present

## 2023-03-20 LAB — GLUCOSE, CAPILLARY
Glucose-Capillary: 102 mg/dL — ABNORMAL HIGH (ref 70–99)
Glucose-Capillary: 111 mg/dL — ABNORMAL HIGH (ref 70–99)
Glucose-Capillary: 124 mg/dL — ABNORMAL HIGH (ref 70–99)
Glucose-Capillary: 84 mg/dL (ref 70–99)
Glucose-Capillary: 86 mg/dL (ref 70–99)
Glucose-Capillary: 98 mg/dL (ref 70–99)

## 2023-03-20 LAB — BASIC METABOLIC PANEL
Anion gap: 7 (ref 5–15)
BUN: 59 mg/dL — ABNORMAL HIGH (ref 8–23)
CO2: 29 mmol/L (ref 22–32)
Calcium: 8.8 mg/dL — ABNORMAL LOW (ref 8.9–10.3)
Chloride: 104 mmol/L (ref 98–111)
Creatinine, Ser: 1.24 mg/dL (ref 0.61–1.24)
GFR, Estimated: 54 mL/min — ABNORMAL LOW (ref >60)
Glucose, Bld: 120 mg/dL — ABNORMAL HIGH (ref 70–99)
Potassium: 4.3 mmol/L (ref 3.5–5.1)
Sodium: 140 mmol/L (ref 135–145)

## 2023-03-20 LAB — CBC
HCT: 25.2 % — ABNORMAL LOW (ref 39.0–52.0)
Hemoglobin: 7.8 g/dL — ABNORMAL LOW (ref 13.0–17.0)
MCH: 30.6 pg (ref 26.0–34.0)
MCHC: 31 g/dL (ref 30.0–36.0)
MCV: 98.8 fL (ref 80.0–100.0)
Platelets: 186 10*3/uL (ref 150–400)
RBC: 2.55 MIL/uL — ABNORMAL LOW (ref 4.22–5.81)
RDW: 17.6 % — ABNORMAL HIGH (ref 11.5–15.5)
WBC: 5.1 10*3/uL (ref 4.0–10.5)
nRBC: 0 % (ref 0.0–0.2)

## 2023-03-20 LAB — CULTURE, RESPIRATORY W GRAM STAIN: Culture: NORMAL

## 2023-03-20 LAB — HEPARIN LEVEL (UNFRACTIONATED)
Heparin Unfractionated: <0.1 [IU]/mL — ABNORMAL LOW (ref 0.30–0.70)
Heparin Unfractionated: 0.18 [IU]/mL — ABNORMAL LOW (ref 0.30–0.70)

## 2023-03-20 LAB — MAGNESIUM: Magnesium: 2.2 mg/dL (ref 1.7–2.4)

## 2023-03-20 MED ORDER — ALBUMIN HUMAN 25 % IV SOLN
12.5000 g | Freq: Four times a day (QID) | INTRAVENOUS | Status: AC
  Administered 2023-03-20 – 2023-03-21 (×4): 12.5 g via INTRAVENOUS
  Filled 2023-03-20 (×4): qty 50

## 2023-03-20 MED ORDER — FUROSEMIDE 10 MG/ML IJ SOLN
40.0000 mg | Freq: Four times a day (QID) | INTRAMUSCULAR | Status: AC
  Administered 2023-03-20 – 2023-03-21 (×4): 40 mg via INTRAVENOUS
  Filled 2023-03-20 (×4): qty 4

## 2023-03-20 MED ORDER — GERHARDT'S BUTT CREAM
TOPICAL_CREAM | Freq: Two times a day (BID) | CUTANEOUS | Status: DC
  Filled 2023-03-20: qty 1

## 2023-03-20 MED ORDER — FREE WATER
250.0000 mL | Freq: Four times a day (QID) | Status: DC
  Administered 2023-03-20 – 2023-03-21 (×4): 250 mL

## 2023-03-20 NOTE — Progress Notes (Signed)
SLP Cancellation Note  Patient Details Name: Stephen Flynn MRN: [id number] DOB: 08/05/1927   Cancelled treatment:        Attempted to see pt for ongoing dysphagia management and PO trials. Pt remains on ventilator following code event 9/15 and is not appropriate to PO trials.  Spoke with nursing.  Pt is opening eyes, but is largely unresponsive except for withdrawing to pain.  SLP will sign off at this time.  Please reconsult post extubation when pt is medically ready for PO trials.    Kerrie Pleasure, MA, CCC-SLP Acute Rehabilitation Services Office: [phone] 03/20/2023, 8:07 AM

## 2023-03-20 NOTE — Progress Notes (Addendum)
NAMEArshaun Flynn, MRN:  [id number], DOB:  1927/09/06, LOS: 23 ADMISSION DATE:  2023/03/22, CONSULTATION DATE: 8/26 REFERRING MD: Dr. Randol Kern, CHIEF COMPLAINT: Hypotension  History of Present Illness:  Patient is encephalopathic and/or intubated; therefore, history has been obtained from chart review.  89 year old male with past medical history as below, which is significant for atrial fibrillation on Eliquis, wheelchair-bound, HFpEF, and stroke who presented to [facility] emergency department on 22-Mar-2023 with complaints of acute onset abdominal pain with no bowel movement for 3 days prior to presentation.  Also sounds like he had not been consistently taking his Eliquis.  Upon arrival to the emergency department the patient was noted to be in atrial fibrillation with RVR to the 120s.  Started on diltiazem infusion and placed on heparin drip.  CT of the abdomen and pelvis demonstrated small bowel obstruction and moderate ascites.  General surgery was consulted and recommended conservative management with NG tube decompression.  He was admitted to the hospitalist service.  In the a.m. hours of 8/26 the patient developed waning mental status and became nonverbal.  He also developed hypotension with systolic pressures in the 70s and desaturation to the 80s.  His abdomen became more firm and rigid and exquisitely tender to pain.  The surgical team was at bedside and determined the patient would likely require urgent surgery.  PCCM was consulted for hypotension and hypoxia.  Pertinent  Medical History   has a past medical history of Anemia, Aortic insufficiency, Chest pain, H/O: GI bleed, Hyperlipidemia, Hypertension, Leg cramps, and Stroke (HCC).   Significant Hospital Events: Including procedures, antibiotic start and stop dates in addition to other pertinent events   March 22, 2023 admit for SBO 8/26 acute abdomen, tx to ICU for pressors. PICC line placed. Underwent Ex lap and small bowel  resection 8/27 Extubated 9/1 Reintubated > bronchoscopy, BAL >> few yeast 9/5 1u pRBC for Hgb 6.6 without signs of bleeding   9/6 extubated  9/10 transferred out of ICU and to [facility]  9/15 bradycardic to PEA arrest ~ down time, pupils 8 and fixed post arrest 9/16 awake, tolerating higher levels of pressure support.  Following commands with both extremities he is profoundly debilitated, discussed concern about deconditioning with his daughter and wife recommended DNI postextubation following optimization of volume status 9/17 a little more awake, still profoundly weak, SBT numbers acceptable however still massively volume overloaded, holding off on extubation for another 24 hours to promote volume removal  Interim History / Subjective:  Awake, will follow commands  Objective   Blood pressure 110/65, pulse 63, temperature 97.8 F (36.6 C), temperature source Axillary, resp. rate 13, height 5' 9.02" (1.753 m), weight 107.2 kg, SpO2 100%. CVP:  [5 mmHg-17 mmHg] 15 mmHg    Vent Mode: CPAP;PSV FiO2 (%):  [40 %-100 %] 40 % Set Rate:  [18 bmp-26 bmp] 18 bmp Vt Set:  [560 mL] 560 mL PEEP:  [5 cmH20] 5 cmH20 Pressure Support:  [10 cmH20-12 cmH20] 12 cmH20 Plateau Pressure:  [22 cmH20-23 cmH20] 23 cmH20   Intake/Output Summary (Last 24 hours) at 03/20/2023 0858 Last data filed at 03/20/2023 0400 Gross per 24 hour  Intake 2326.69 ml  Output 3385 ml  Net -1058.31 ml   Filed Weights   03/18/23 0549 03/19/23 0500 03/20/23 0330  Weight: 113.2 kg 107.7 kg 107.2 kg    Examination: General This is a profoundly debilitated 89 year old male patient who remains on mechanical ventilation HEENT normocephalic atraumatic mucous membranes  are moist he is orally intubated Pulmonary: Some scattered rhonchi, currently tolerating pressure support ventilation, on SBT he was able to pull 500 to 600 cc tidal volumes Portable chest x-ray personally reviewed shows improved aeration when comparing film on day  prior support lines and tubes are in satisfactory position Cardiac regular rate and rhythm Abdomen soft wound VAC in place tolerating tube feeds GU clear yellow urine he has put out a total of 3.4 L since yesterday however he is still massively volume overloaded  Resolved Hospital Problem list   AKI Hypernatremia  In hospital bradycardic to PEA arrest. (15 minute down time) Assessment & Plan:   HFrEF (40-45% w/ global hypokinesis now s/p in house cardiac arrest 9/15 w/drug related hypotension plan Cont tele  Wean NE for SBP > 100  Holding GDMT given hypotension  Keep euvolemic   Paroxysmal atrial fibrillation  Plan Continue to hold amiodarone Cont tele  Cont IV heparin for now  Acute Hypoxic Respiratory Failure due to cardiopulmonary arrest c/b Recurrent aspiration pneumonia  MRSA PCR was negative Sputum still reincubating max temp 98.6 over the last 24 hours Plan Day #3 ceftriaxone, will plan to complete 5 days total Continue to follow sputum culture Wean sedation for RASS goal 0 to -1 VAP bundle Continue pressure support as tolerated for another 24 hours as we attempt to optimize volume status, I do think mechanically we could extubate him however I like to get more volume off before proceeding  continue Lasix and albumin again today I discussed with his daughter at length about my current concern for his deconditioning, have recommended DO NOT RESUSCITATE and do not reintubate after we finally get to the point he is extubated, will need to reconfirm this prior to extubation  Small bowel obstruction s/p ex lap and small bowel resection 03-03-2023 Plan Primary management per CCS  Postpyloric tube feeds  Optimize nutrition as able  Wound vac per CCS  Acute metabolic encephalopathy w/ Concern for anoxic brain injury s/p cardiac arrest Following commands but profoundly weak and debilitated.  CT head negative for acute injury Plan Maintain neuro protective measures; Avoid  fever Keep euglycemic  Keep normocapnic  Asp precautions   LUE swelling r/o DVT Plan Getting [place]   Mild AKI s/p cardiac arrest Total body volume overload Tolerating active diuresis with albumin Lasix combo Plan Renal dose medications Strict intake output A.m. chemistry  Anemia, due to critical illness superimposed on iron deficiency anemia  Hgb trending down some, but no obvious bleeding Plan A.m. CBC Transfuse per protocol Hgb goal > 7  Diabetes mellitus, controlled Hemoglobin A1c 6.5, better glycemic control following changed to moderate sliding scale Plan Cont ssi goal 140-180  Continue moderate sliding scale insulin  Moderate malnutrition Baseline debility: Wheelchair-bound Plan Cont tubefeeds RD following   Best Practice (right click and "Reselect all SmartList Selections" daily)   Diet/type: NPO  TF DVT prophylaxis: Systemic heparin GI prophylaxis: N/A Lines: N/A Foley:  N/A Code Status:  full code Last date of multidisciplinary goals of care discussion Pending post arrest   My cct 35 minutes 03/20/2023, 8:58 AM

## 2023-03-20 NOTE — Progress Notes (Signed)
CSW following to discuss PT recs when appropriate.

## 2023-03-20 NOTE — Plan of Care (Signed)
Problem: Education: Goal: Ability to describe self-care measures that may prevent or decrease complications (Diabetes Survival Skills Education) will improve Outcome: Progressing   Problem: Coping: Goal: Ability to adjust to condition or change in health will improve Outcome: Progressing   Problem: Fluid Volume: Goal: Ability to maintain a balanced intake and output will improve Outcome: Progressing

## 2023-03-20 NOTE — Progress Notes (Signed)
22 Days Post-Op   Subjective/Chief Complaint: Following commands on the vent.  Tolerating TF @ 50 mL/hr On low dose levo  Objective: Vital signs in last 24 hours: Temp:  [96.4 F (35.8 C)-98.6 F (37 C)] 97.6 F (36.4 C) (09/17 0800) Pulse Rate:  [56-94] 62 (09/17 0900) Resp:  [12-26] 16 (09/17 0900) BP: (80-158)/(51-96) 108/65 (09/17 0900) SpO2:  [87 %-100 %] 100 % (09/17 0900) FiO2 (%):  [40 %-100 %] 40 % (09/17 0747) Weight:  [107.2 kg] 107.2 kg (09/17 0330) Last BM Date : 03/20/23  Intake/Output from previous day: 09/16 0701 - 09/17 0700 In: 2378 [I.V.:546.6; NG/GT:1551.5; IV Piggyback:249.9] Out: 3390 [Urine:2955; Stool:435] Intake/Output this shift: Total I/O In: -  Out: 35 [Stool:35]  Eyes open, ill appearing, on ventilator Cortrak in place, tube feeds running at 50 mL/hr Abdomen soft and distended, midline dressing c/d/I, facial grimace to abdominal palpation, no guarding or rebound tenderness LUE>RUE swelling    Lab Results:  Recent Labs    03/19/23 0328 03/20/23 0425  WBC 5.6 5.1  HGB 8.3* 7.8*  HCT 26.9* 25.2*  PLT 202 186   BMET Recent Labs    03/19/23 0328 03/20/23 0425  NA 139 140  K 4.1 4.3  CL 108 104  CO2 26 29  GLUCOSE 219* 120*  BUN 55* 59*  CREATININE 1.21 1.24  CALCIUM 8.3* 8.8*   PT/INR No results for input(s): "LABPROT", "INR" in the last 72 hours.  ABG Recent Labs    03/18/23 1458  PHART 7.299*  HCO3 31.7*    Studies/Results: CT HEAD WO CONTRAST ( )  Result Date: 03/20/2023 CLINICAL DATA:  Anoxic brain injury, 24 hours post TNK EXAM: CT HEAD WITHOUT CONTRAST TECHNIQUE: Contiguous axial images were obtained from the base of the skull through the vertex without intravenous contrast. RADIATION DOSE REDUCTION: This exam was performed according to the departmental dose-optimization program which includes automated exposure control, adjustment of the mA and/or kV according to patient size and/or use of iterative  reconstruction technique. COMPARISON:  03/12/2023 CT head FINDINGS: Brain: No evidence of acute infarction, hemorrhage, mass, mass effect, or midline shift. No hydrocephalus or extra-axial fluid collection. Basal ganglia calcifications. Age related cerebral atrophy. Periventricular white matter changes, likely the sequela of chronic small vessel ischemic disease. Vascular: No hyperdense vessel. Atherosclerotic calcifications in the intracranial carotid and vertebral arteries. Skull: Negative for fracture or focal lesion. Sinuses/Orbits: Mucosal thickening throughout the paranasal sinuses. No acute finding in the orbits. Other: Fluid throughout the bilateral mastoid air cells. IMPRESSION: No acute intracranial process. Electronically Signed   By: Wiliam Ke M.D.   On: 03/20/2023 03:29   DG CHEST PORT 1 VIEW  Result Date: 03/18/2023 CLINICAL DATA:  Endotracheal intubation EXAM: PORTABLE CHEST 1 VIEW COMPARISON:  03/14/2023 FINDINGS: Interval placement of endotracheal tube, tip above the carina. Enteric feeding tube with tip below the diaphragm, not visualized. Cardiomegaly with diffuse bilateral interstitial pulmonary opacity and probable small layering left pleural effusion, not significantly changed. Osseous structures unremarkable. IMPRESSION: 1. Interval placement of endotracheal tube, tip above the carina. Enteric feeding tube with tip below the diaphragm, not visualized. 2. Cardiomegaly with diffuse bilateral interstitial pulmonary opacity and probable small layering left pleural effusion, not significantly changed. No new or focal airspace opacity. Electronically Signed   By: Jearld Lesch M.D.   On: 03/18/2023 13:38    Anti-infectives: Anti-infectives (From admission, onward)    Start     Dose/Rate Route Frequency Ordered Stop   03/18/23 1400  cefTRIAXone (ROCEPHIN) 2 g in sodium chloride 0.9 % 100 mL IVPB        2 g 200 mL/hr over 30 Minutes Intravenous Every 24 hours 03/18/23 1314      03/18/23 1400  metroNIDAZOLE (FLAGYL) IVPB 500 mg  Status:  Discontinued        500 mg 100 mL/hr over 60 Minutes Intravenous Every 12 hours 03/18/23 1314 03/19/23 0909   03/14/23 0700  cefTRIAXone (ROCEPHIN) 1 g in sodium chloride 0.9 % 100 mL IVPB  Status:  Discontinued        1 g 200 mL/hr over 30 Minutes Intravenous Daily 03/14/23 0621 03/14/23 0759   03/14/23 0630  azithromycin (ZITHROMAX) 500 mg in sodium chloride 0.9 % 250 mL IVPB  Status:  Discontinued        500 mg 250 mL/hr over 60 Minutes Intravenous Daily 03/14/23 0621 03/14/23 0759   03/04/23 1000  meropenem (MERREM) 1 g in sodium chloride 0.9 % 100 mL IVPB        1 g 200 mL/hr over 30 Minutes Intravenous Every 12 hours 03/04/23 0855 03/10/23 2132   03/01/23 1500  ceFEPIme (MAXIPIME) 2 g in sodium chloride 0.9 % 100 mL IVPB        2 g 200 mL/hr over 30 Minutes Intravenous Every 12 hours 03/01/23 1124 03/02/23 0356   03/01/23 0300  ceFEPIme (MAXIPIME) 2 g in sodium chloride 0.9 % 100 mL IVPB  Status:  Discontinued        2 g 200 mL/hr over 30 Minutes Intravenous Every 24 hours 02/28/23 1141 03/01/23 1124   Mar 16, 2023 1445  piperacillin-tazobactam (ZOSYN) IVPB 3.375 g  Status:  Discontinued        3.375 g 100 mL/hr over 30 Minutes Intravenous Every 8 hours 03-16-2023 1436 2023/03/16 1445   03/16/2023 1445  ceFEPIme (MAXIPIME) 2 g in sodium chloride 0.9 % 100 mL IVPB  Status:  Discontinued        2 g 200 mL/hr over 30 Minutes Intravenous Every 12 hours 03/16/23 1446 02/28/23 1141   Mar 16, 2023 1445  metroNIDAZOLE (FLAGYL) IVPB 500 mg        500 mg 100 mL/hr over 60 Minutes Intravenous Every 12 hours 03-16-23 1446 03/02/23 1719   March 16, 2023 1015  meropenem (MERREM) 1 g in sodium chloride 0.9 % 100 mL IVPB        1 g 200 mL/hr over 30 Minutes Intravenous  Once 03-16-23 0917 2023/03/16 1743       Assessment/Plan: -POD#22 s/p Exploratory laparotomy small bowel resection and primary anastomosis 03/16/23 Dr. Luisa Hart - approximately 120 cm of  small bowel remaining. Flexiseal placed Friday for diarrhea- Banatrol 60 mL BID. Imodium 2 mg BID today per tube. Remove flexi seal as soon as able - dysphagia, failed SLP Eval 9/13. Recurrent aspiration. Cardiac arrest 9/15.  - continue post-pylori tube feeds - daily most-to-dry dressings.  - on hep gtt for afib   Hosie Spangle, PA-C General & Trauma Surgery [facility], [place] [phone] for weekday/non holidays Check [email or web address] for coverage night/weekend/holidays

## 2023-03-20 NOTE — Progress Notes (Signed)
Spoke to daughter Lanae Crumbly via phone  Plan of care discussed Anticipate extubation 9/18 after maximizing fluid status.  -family does not want to continue prolonged life support -does not want trach/long term vent -is agreeable to DNR/DNI post extubation. I specifically said "if he declines after he comes off the ventilator this time we should not re-intubate", I clarified further that this could mean transition to comfort.  -Lanae Crumbly was in agreement with this plan.   Plan Cont supportive care Working towards extubation w/ plan to notify Larita prior to extubation  Will be full DNR/DNI after extubation   Simonne Martinet ACNP-BC [facility] Pulmonary/Critical Care Pager # [phone] OR # [phone] if no answer

## 2023-03-20 NOTE — Progress Notes (Signed)
Daily Progress Note   Patient Name: Stephen Flynn       Date: 03/20/2023 DOB: 1928/07/01  Age: 89 y.o. MRN#: [id number] Attending Physician: Josephine Igo, DO Primary Care Physician: Creola Corn, MD Admit Date: 02/14/2023  Reason for Consultation/Follow-up: Establishing goals of care  Subjective: Medical records reviewed including progress notes, labs, imaging. Patient assessed at the bedside.  His wife is just been brought to the visit by his granddaughter.  Patient is intubated, awakes to family's voice and grimaces.  Patient's wife and granddaughter are unsure about the progress daughter has made and coordinating family members for a goals of care discussion.  They recommend that I reach out to her again today to continue attempts at scheduling a family meeting.  I then called patient's daughter Stephen Flynn, who tells me her brother (who lives in [place]) will likely not be available until this Friday.  His wife is out of town until tomorrow and she was planning to coordinate this with her at that time.  She states that she will call me back tomorrow to confirm a final date/time.  She denies any need for conversation at this time.  Questions and concerns addressed. PMT will continue to support holistically.   Length of Stay: 23  Physical Exam Vitals and nursing note reviewed.  Constitutional:      Appearance: He is ill-appearing.     Interventions: He is intubated.  Cardiovascular:     Rate and Rhythm: Normal rate.  Pulmonary:     Effort: Pulmonary effort is normal. No respiratory distress. He is intubated.  Neurological:     Mental Status: He is easily aroused.            Vital Signs: BP 108/65   Pulse 62   Temp 97.6 F (36.4 C) (Oral)   Resp 16   Ht 5' 9.02" (1.753 m)   Wt  107.2 kg   SpO2 100%   BMI 34.88 kg/m  SpO2: SpO2: 100 % O2 Device: O2 Device: Ventilator O2 Flow Rate: O2 Flow Rate (L/min): 2 L/min      Palliative Assessment/Data: 30% (tube feeds)    Palliative Care Assessment & Plan   Patient Profile: 89 y.o. male  with past medical history of persistent A-fib on Eliquis, diastolic CHF, CVA, BPH, DM-2, thoracic AAA and debility with  wheelchair dependence, poorly controlled hypertension admitted on 02/06/2023 with acute abdominal pain.    In the ED, patient was in A-fib with RVR and CT abdomen and pelvis showed small bowel obstruction, moderate ascites and BPH.  Initially was treated with conservative management and NG tube decompression for small bowel obstruction, however on 13-Mar-2023 he required pressor support and underwent exploratory laparotomy and small bowel resection.  Echo showed EF of 40 to 45%.   Patient was extubated on 8/27 then reintubated on 9/1.  Most recently extubated on 9/6. PCCM has discussed possible tracheostomy with patient's family. PMT has been consulted to assist with goals of care conversation on hospital day 16 after transfer to the ICU.  Assessment: Goals of care conversation Small bowel obstruction status post exploratory laparotomy and small bowel resection with primary anastomosis A-fib with RVR Acute respiratory failure with hypoxia and hypercapnia, improved Acute metabolic and septic encephalopathy  Recommendations/Plan: Continue full code/full scope treatment Patient's daughter will coordinate a preferred date/time for a family meeting with all 3 adult children and patient's wife.  She anticipates this may be on Friday 9/20 Psychosocial and emotional support provided PMT will continue to follow and support intermittently  Prognosis: Poor with high risk for further decline and decompensation  Discharge Planning: To Be Determined  Care plan was discussed with patient's daughter, patient's wife,  granddaughter   Time: 35 minutes         Celene Pippins Jeni Salles, PA-C  Palliative Medicine Team Team phone # [phone]  Thank you for allowing the Palliative Medicine Team to assist in the care of this patient. Please utilize secure chat with additional questions, if there is no response within 30 minutes please call the above phone number.  Palliative Medicine Team providers are available by phone from 7am to 7pm daily and can be reached through the team cell phone.  Should this patient require assistance outside of these hours, please call the patient's attending physician.

## 2023-03-20 NOTE — Progress Notes (Signed)
Pt transported from 2H06 to CT and back on 100% O2 w/settings as per flowsheet. Pt tol scan and transport well.

## 2023-03-20 NOTE — Progress Notes (Signed)
eLink Physician-Brief Progress Note Patient Name: Stephen Flynn DOB: 1928/05/31 MRN: [id number]   Date of Service  03/20/2023  HPI/Events of Note  CT head done  eICU Interventions  No acute intracranial process seen     Intervention Category Intermediate Interventions: Diagnostic test evaluation  Darl Pikes 03/20/2023, 5:11 AM

## 2023-03-20 NOTE — Progress Notes (Incomplete)
ANTICOAGULATION CONSULT NOTE-Follow Up  Pharmacy Consult for Heparin Indication: atrial fibrillation  Allergies  Allergen Reactions   Penicillin G     Other reaction(s): Drowsy   Penicillins Hives    Did it involve swelling of the face/tongue/throat, SOB, or low BP? No Did it involve sudden or severe rash/hives, skin peeling, or any reaction on the inside of your mouth or nose? yes Did you need to seek medical attention at a hospital or doctor's office? yes When did it last happen?  child     If all above answers are "NO", may proceed with cephalosporin use.    Patient Measurements: Height: 5' 9.02" (175.3 cm) Weight: 107.2 kg (236 lb 5.3 oz) IBW/kg (Calculated) : 70.74 Heparin Dosing Weight: 95 kg  Vital Signs: Temp: 97.8 F (36.6 C) (09/17 0349) Temp Source: Axillary (09/17 0349) BP: 99/67 (09/17 0545) Pulse Rate: 67 (09/17 0747)  Labs: Recent Labs    03/18/23 1505 03/19/23 0328 03/19/23 1838 03/20/23 0425  HGB 8.6* 8.3*  --  7.8*  HCT 28.4* 26.9*  --  25.2*  PLT 165 202  --  186  HEPARINUNFRC  --  <0.10* 0.99* <0.10*  CREATININE 1.14 1.21  --  1.24    Estimated Creatinine Clearance: 44.9 mL/min (by C-G formula based on SCr of 1.24 mg/dL).   Assessment: 89 YOM s/p SBR on Xarelto prior to admission for atrial fibrillation. Plans to D/C wound VAC on Monday (9/16).  Heparin level *** is ***therapeutic with heparin running at *** units/hr. Hgb (***) and PLTs (***) are ***. Per RN, no report of pauses, issues with the line, or signs of bleeding.   Goal of Therapy:  Heparin level 0.3-0.5 units/ml Monitor platelets by anticoagulation protocol: Yes   Plan:  Hold heparin drip x 30 minutes Decrease heparin to 1200 units / hr Check heparin level 8 hrs after resumption. Daily heparin level and CBC F/u plans to transition to oral [facility]  Romie Minus, PharmD PGY1 Pharmacy Resident  Please check AMION for all [facility] Pharmacy phone numbers After 10:00 PM, call  Main Pharmacy [phone]

## 2023-03-20 NOTE — Progress Notes (Signed)
Left upper extremity venous duplex has been completed. Preliminary results can be found in CV Proc through chart review.   03/20/23 2:23 PM Olen Cordial RVT

## 2023-03-20 NOTE — Progress Notes (Signed)
ANTICOAGULATION CONSULT NOTE  Pharmacy Consult for Heparin Indication: atrial fibrillation  Allergies  Allergen Reactions   Penicillin G     Other reaction(s): Drowsy   Penicillins Hives    Did it involve swelling of the face/tongue/throat, SOB, or low BP? No Did it involve sudden or severe rash/hives, skin peeling, or any reaction on the inside of your mouth or nose? yes Did you need to seek medical attention at a hospital or doctor's office? yes When did it last happen?  child     If all above answers are "NO", may proceed with cephalosporin use.    Patient Measurements: Height: 5' 9.02" (175.3 cm) Weight: 107.2 kg (236 lb 5.3 oz) IBW/kg (Calculated) : 70.74 Heparin Dosing Weight: 95 kg  Vital Signs: Temp: 98.3 F (36.8 C) (09/17 1600) Temp Source: Oral (09/17 1600) BP: 116/73 (09/17 1526) Pulse Rate: 60 (09/17 1526)  Labs: Recent Labs    03/18/23 1505 03/19/23 0328 03/19/23 1838 03/20/23 0425 03/20/23 1627  HGB 8.6* 8.3*  --  7.8*  --   HCT 28.4* 26.9*  --  25.2*  --   PLT 165 202  --  186  --   HEPARINUNFRC  --  <0.10* 0.99* <0.10* 0.18*  CREATININE 1.14 1.21  --  1.24  --     Estimated Creatinine Clearance: 44.9 mL/min (by C-G formula based on SCr of 1.24 mg/dL).   Assessment: 89 YOM s/p SBR on Xarelto PTA for atrial fibrillation.  Pharmacy consulted to dose IV heparin while Xarelto is on hold.    Heparin level sub-therapeutic at 0.18 units/hr.  Lab drawn by phlebotomist and no issue with heparin infusion nor bleeding per discussion with RN.  Goal of Therapy:  Heparin level 0.3-0.5 units/ml Monitor platelets by anticoagulation protocol: Yes   Plan:  Increase heparin gtt to 1650 units/hr Check 8 hr heparin level  Danford Tat D. Laney Potash, PharmD, BCPS, BCCCP 03/20/2023, 5:52 PM

## 2023-03-20 NOTE — Progress Notes (Signed)
eLink Physician-Brief Progress Note Patient Name: Noar Alessandro DOB: 02-17-1928 MRN: [id number]   Date of Service  03/20/2023  HPI/Events of Note  Plan one way extubation in AM. Asking if she can switch patient back to [facility] for tonight to allow him to rest and get pain meds.  Vent orders are already in for [facility]. Believe pt was on CPAP from weaning on days   eICU Interventions  No objection to switch to [facility] for comfort Discussed with bedside RN     Intervention Category Intermediate Interventions: Other:  Darl Pikes 03/20/2023, 9:19 PM

## 2023-03-21 ENCOUNTER — Inpatient Hospital Stay (HOSPITAL_COMMUNITY): Payer: 59

## 2023-03-21 DIAGNOSIS — Z7189 Other specified counseling: Secondary | ICD-10-CM | POA: Diagnosis not present

## 2023-03-21 DIAGNOSIS — J9601 Acute respiratory failure with hypoxia: Secondary | ICD-10-CM | POA: Diagnosis not present

## 2023-03-21 DIAGNOSIS — A419 Sepsis, unspecified organism: Secondary | ICD-10-CM | POA: Diagnosis not present

## 2023-03-21 DIAGNOSIS — K56609 Unspecified intestinal obstruction, unspecified as to partial versus complete obstruction: Secondary | ICD-10-CM | POA: Diagnosis not present

## 2023-03-21 LAB — GLUCOSE, CAPILLARY
Glucose-Capillary: 104 mg/dL — ABNORMAL HIGH (ref 70–99)
Glucose-Capillary: 108 mg/dL — ABNORMAL HIGH (ref 70–99)
Glucose-Capillary: 84 mg/dL (ref 70–99)
Glucose-Capillary: 87 mg/dL (ref 70–99)
Glucose-Capillary: 91 mg/dL (ref 70–99)
Glucose-Capillary: 93 mg/dL (ref 70–99)

## 2023-03-21 LAB — CBC
HCT: 24.5 % — ABNORMAL LOW (ref 39.0–52.0)
Hemoglobin: 7.5 g/dL — ABNORMAL LOW (ref 13.0–17.0)
MCH: 30.6 pg (ref 26.0–34.0)
MCHC: 30.6 g/dL (ref 30.0–36.0)
MCV: 100 fL (ref 80.0–100.0)
Platelets: 155 10*3/uL (ref 150–400)
RBC: 2.45 MIL/uL — ABNORMAL LOW (ref 4.22–5.81)
RDW: 18.6 % — ABNORMAL HIGH (ref 11.5–15.5)
WBC: 4.6 10*3/uL (ref 4.0–10.5)
nRBC: 0 % (ref 0.0–0.2)

## 2023-03-21 LAB — HEPARIN LEVEL (UNFRACTIONATED): Heparin Unfractionated: 0.32 [IU]/mL (ref 0.30–0.70)

## 2023-03-21 LAB — PREPARE RBC (CROSSMATCH)

## 2023-03-21 MED ORDER — POTASSIUM CHLORIDE CRYS ER 20 MEQ PO TBCR
20.0000 meq | EXTENDED_RELEASE_TABLET | Freq: Once | ORAL | Status: DC
  Filled 2023-03-21: qty 1

## 2023-03-21 MED ORDER — PANTOPRAZOLE SODIUM 40 MG IV SOLR
40.0000 mg | INTRAVENOUS | Status: DC
  Administered 2023-03-21 – 2023-03-23 (×3): 40 mg via INTRAVENOUS
  Filled 2023-03-21 (×3): qty 10

## 2023-03-21 MED ORDER — SODIUM CHLORIDE 0.9% IV SOLUTION: Freq: Once | INTRAVENOUS | Status: AC

## 2023-03-21 MED ORDER — FUROSEMIDE 10 MG/ML IJ SOLN
40.0000 mg | Freq: Once | INTRAMUSCULAR | Status: AC
  Administered 2023-03-21: 40 mg via INTRAVENOUS
  Filled 2023-03-21: qty 4

## 2023-03-21 MED ORDER — MIDAZOLAM HCL 2 MG/2ML IJ SOLN
1.0000 mg | INTRAMUSCULAR | Status: DC | PRN
  Administered 2023-03-21: 2 mg via INTRAVENOUS
  Administered 2023-03-21: 1 mg via INTRAVENOUS
  Administered 2023-03-23: 2 mg via INTRAVENOUS
  Filled 2023-03-21 (×3): qty 2

## 2023-03-21 MED ORDER — BANATROL TF EN LIQD
60.0000 mL | Freq: Four times a day (QID) | ENTERAL | Status: DC
  Administered 2023-03-21 – 2023-03-22 (×3): 60 mL
  Filled 2023-03-21 (×3): qty 60

## 2023-03-21 MED ORDER — POTASSIUM CHLORIDE 20 MEQ PO PACK
20.0000 meq | PACK | Freq: Once | ORAL | Status: AC
  Administered 2023-03-21: 20 meq
  Filled 2023-03-21: qty 1

## 2023-03-21 NOTE — TOC Progression Note (Signed)
Transition of Care [facility]) - Progression Note    Patient Details  Name: Stephen Flynn MRN: [id number] Date of Birth: 1928/06/22  [facility] CM/SW Contact  Elliot Cousin, RN Phone Number: [phone]  03/21/2023, 9:28 AM  Clinical Narrative:   Chart reviewed, currently on vent, and TPN. Pt lives at home with wife at [facility] apt. Discussed with wife long term goals, wife agreeable to SNF rehab. Will need PT/OT evaluation when medically ready for therapy for recommendations. Will discuss with attending for LTAC appropriateness.     Expected Discharge Plan: Long Term Acute Care (LTAC) Barriers to Discharge: Continued Medical Work up  Expected Discharge Plan and Services   Discharge Planning Services: CM Consult   Living arrangements for the past 2 months: Apartment                                       Social Determinants of Health (SDOH) Interventions SDOH Screenings   Food Insecurity: No Food Insecurity (03/13/2023)  Housing: Low Risk  (03/13/2023)  Transportation Needs: No Transportation Needs (03/13/2023)  Utilities: Not At Risk (03/13/2023)  Depression (PHQ2-9): Low Risk  (04/02/2019)  Tobacco Use: Medium Risk (02/04/2023)    Readmission Risk Interventions     No data to display

## 2023-03-21 NOTE — Progress Notes (Addendum)
NAMEHarvy Flynn, MRN:  [id number], DOB:  1928/03/23, LOS: 24 ADMISSION DATE:  02/21/2023, CONSULTATION DATE: 03-03-23 REFERRING MD: Dr. Randol Kern, CHIEF COMPLAINT: Hypotension  History of Present Illness:  Patient is encephalopathic and/or intubated; therefore, history has been obtained from chart review.  89 year old male with past medical history as below, which is significant for atrial fibrillation on Eliquis, wheelchair-bound, HFpEF, and stroke who presented to [facility] emergency department on 8/25 with complaints of acute onset abdominal pain with no bowel movement for 3 days prior to presentation.  Also sounds like he had not been consistently taking his Eliquis.  Upon arrival to the emergency department the patient was noted to be in atrial fibrillation with RVR to the 120s.  Started on diltiazem infusion and placed on heparin drip.  CT of the abdomen and pelvis demonstrated small bowel obstruction and moderate ascites.  General surgery was consulted and recommended conservative management with NG tube decompression.  He was admitted to the hospitalist service.  In the a.m. hours of 03/03/23 the patient developed waning mental status and became nonverbal.  He also developed hypotension with systolic pressures in the 70s and desaturation to the 80s.  His abdomen became more firm and rigid and exquisitely tender to pain.  The surgical team was at bedside and determined the patient would likely require urgent surgery.  PCCM was consulted for hypotension and hypoxia.  Pertinent  Medical History   has a past medical history of Anemia, Aortic insufficiency, Chest pain, H/O: GI bleed, Hyperlipidemia, Hypertension, Leg cramps, and Stroke (HCC).   Significant Hospital Events: Including procedures, antibiotic start and stop dates in addition to other pertinent events   8/25 admit for SBO 2023/03/03 acute abdomen, tx to ICU for pressors. PICC line placed. Underwent Ex lap and small bowel  resection 8/27 Extubated 9/1 Reintubated > bronchoscopy, BAL >> few yeast 9/5 1u pRBC for Hgb 6.6 without signs of bleeding   9/6 extubated  9/10 transferred out of ICU and to [facility]  9/15 bradycardic to PEA arrest ~ down time, pupils 8 and fixed post arrest 9/16 awake, tolerating higher levels of pressure support.  Following commands with both extremities he is profoundly debilitated, discussed concern about deconditioning with his daughter and wife recommended DNI postextubation following optimization of volume status 9/17 a little more awake, still profoundly weak, SBT numbers acceptable however still massively volume overloaded, holding off on extubation for another 24 hours to promote volume removal  Interim History / Subjective:  Given lasix and albumin yesterday; UOP 1.9 L last 24 hours and CVP 18 this am Overnight rested on PRVC on vent and started back on fentanyl drip  This am sleepy and following some commands.  Started on PSV but occasional episodes of apnea Sedation just turned off On 1 mcg levo  Objective   Blood pressure 113/67, pulse 63, temperature 97.7 F (36.5 C), temperature source Axillary, resp. rate 18, height 5' 9.02" (1.753 m), weight 107.2 kg, SpO2 100%. CVP:  [7 mmHg-74 mmHg] 13 mmHg    Vent Mode: PRVC FiO2 (%):  [40 %] 40 % Set Rate:  [18 bmp] 18 bmp Vt Set:  [560 mL] 560 mL PEEP:  [5 cmH20] 5 cmH20 Pressure Support:  [10 cmH20-12 cmH20] 10 cmH20 Plateau Pressure:  [19 cmH20] 19 cmH20   Intake/Output Summary (Last 24 hours) at 03/21/2023 0743 Last data filed at 03/21/2023 0600 Gross per 24 hour  Intake 2739.19 ml  Output 3370 ml  Net -630.81 ml   Filed Weights   03/19/23 0500 03/20/23 0330 03/21/23 0500  Weight: 107.7 kg 107.2 kg 107.2 kg    Examination: General: critically ill appearing male intubated on mech vent HEENT: MM pink/moist; ett in place Neuro: just turned off sedation; lethargic but following some commands CV: s1s2, RRR, no  m/r/g PULM:  dim rhonchi BS bilaterally; on mech vent PRVC GI: soft, wound vac dressing in place Extremities: warm/dry, trace ble edema  Skin: no rashes or lesions   Resolved Hospital Problem list   AKI Hypernatremia  In hospital bradycardic to PEA arrest. (15 minute down time) Assessment & Plan:   HFrEF (40-45% w/ global hypokinesis now s/p in house cardiac arrest 9/15 w/drug related hypotension Plan: -cont levo for SBP >100 -daily weights; strict I/O's -lasix this am; monitor UOP and CVP -unable to start GDMT w/ hypotension  Paroxysmal atrial fibrillation  Plan: -tele monitoring -rate currently controlled -cont iv heparin  Acute Hypoxic Respiratory Failure due to cardiopulmonary arrest c/b Recurrent aspiration pneumonia  MRSA PCR was negative Sputum still reincubating max temp 98.6 over the last 24 hours Plan: -will reattempt SBT later this am when more awake with hopes for possible one way extubation if passes SBT -CVP 18 will give lasix this am -Wean PEEP/FiO2 for SpO2 >92% -VAP bundle in place -Daily SAT and SBT -PAD protocol in place -wean sedation for RASS goal 0 to -1 -Follow intermittent CXR -cont rocephin for 5 days total; follow sputum culture  Small bowel obstruction s/p ex lap and small bowel resection 02/09/2023 Plan: -Per CCS -cont TF through core track -wound vac care per CCS  Acute metabolic encephalopathy w/ Concern for anoxic brain injury s/p cardiac arrest Following commands but profoundly weak and debilitated.  CT head negative for acute injury Plan: -improving; yesterday Awake and following commands -limit sedating meds -wean sedation for RASS 0 to -1  LUE swelling r/o DVT Plan: -no evidence of DVT on dvt US -elevate extremity  Mild AKI s/p cardiac arrest Total body volume overload Tolerating active diuresis with albumin Lasix combo Plan: -lasix today -Trend BMP / urinary output -Replace electrolytes as indicated -Avoid nephrotoxic  agents, ensure adequate renal perfusion  Anemia, due to critical illness superimposed on iron deficiency anemia  Hgb trending down some, but no obvious bleeding Plan: -transfused 1 unit this am -trend cbc  Diabetes mellitus, controlled Hemoglobin A1c 6.5, better glycemic control following changed to moderate sliding scale Plan: -ssi and cbg monitoring  Moderate malnutrition Baseline debility: Wheelchair-bound Plan: -RD following for nutrition requirements  Best Practice (right click and "Reselect all SmartList Selections" daily)   Diet/type: tubefeeds  DVT prophylaxis: Systemic heparin GI prophylaxis: PPI Lines: N/A Foley:  Yes, and it is still needed Code Status:  full code Last date of multidisciplinary goals of care discussion 9/18 spoke with daughter over phone. Plan for one way extubation this afternoon when family is able to come in. Daughter agrees with plan. If patient is to worsen post extubation will transition to comfort care.  My cct 35 minutes 03/21/2023, 7:43 AM  JD Anselm Lis [facility] 03/21/2023, 8:32 AM  Please see [email or web address] for pager details.  From 7A-7P if no response, please call [phone]. After hours, please call ELink [phone].

## 2023-03-21 NOTE — Progress Notes (Signed)
23 Days Post-Op   Subjective/Chief Complaint: Planned for extubation today, one-way.  Asked to see patient prior to extubation due to concern by RN for stool coming from Cortrak.  Tolerating TFs prior to this morning when they were held for extubation.  Having bowel function and BMs  Objective: Vital signs in last 24 hours: Temp:  [97.7 F (36.5 C)-98.7 F (37.1 C)] 98 F (36.7 C) (09/18 1200) Pulse Rate:  [53-73] 65 (09/18 1408) Resp:  [10-24] 19 (09/18 1408) BP: (81-138)/(54-93) 137/71 (09/18 1305) SpO2:  [87 %-100 %] 96 % (09/18 1408) FiO2 (%):  [40 %] 40 % (09/18 1408) Weight:  [107.2 kg] 107.2 kg (09/18 0500) Last BM Date : 03/20/23  Intake/Output from previous day: 09/17 0701 - 09/18 0700 In: 2739.2 [I.V.:734.9; NG/GT:1578.5; IV Piggyback:335.8] Out: 3415 [Urine:1910; Stool:1505] Intake/Output this shift: Total I/O In: 382.6 [I.V.:382.6] Out: 1175 [Urine:675; Stool:500]  PE: Respiratory: on vent Abd: soft, ND, Cortrak in place.  Small amount of content on a washcloth that was green.  Smelt sweet like TF that clearly was mixed with some bile/enteric content.  OG placed prior to my arrival and with output.     Lab Results:  Recent Labs    03/20/23 0425 03/21/23 0425  WBC 5.1 4.7  HGB 7.8* 6.8*  HCT 25.2* 23.6*  PLT 186 164   BMET Recent Labs    03/20/23 0425 03/21/23 0425  NA 140 142  K 4.3 3.9  CL 104 107  CO2 29 30  GLUCOSE 120* 86  BUN 59* 64*  CREATININE 1.24 1.50*  CALCIUM 8.8* 8.6*   PT/INR No results for input(s): "LABPROT", "INR" in the last 72 hours.  ABG Recent Labs    03/18/23 1458  PHART 7.299*  HCO3 31.7*    Studies/Results: VAS [place] UPPER EXTREMITY VENOUS DUPLEX  Result Date: 03/20/2023 UPPER VENOUS STUDY  Patient Name:  Stephen Flynn  Date of Exam:   03/20/2023 Medical Rec #: [id number]      Accession #:    [id number] Date of Birth: 04-10-1928       Patient Gender: M Patient Age:   89 years Exam Location:  [facility]  Procedure:      VAS [place] UPPER EXTREMITY VENOUS DUPLEX Referring Phys: Zenia Resides --------------------------------------------------------------------------------  Indications: Swelling Risk Factors: None identified. Anticoagulation: Heparin. Limitations: Bandages, patient positioning, patient immobility and line. Comparison Study: No prior studies. Performing Technologist: Chanda Busing RVT  Examination Guidelines: A complete evaluation includes B-mode imaging, spectral Doppler, color Doppler, and power Doppler as needed of all accessible portions of each vessel. Bilateral testing is considered an integral part of a complete examination. Limited examinations for reoccurring indications may be performed as noted.  Right Findings: +----------+------------+---------+-----------+----------+-------+ RIGHT     CompressiblePhasicitySpontaneousPropertiesSummary +----------+------------+---------+-----------+----------+-------+ Subclavian    Full       Yes       Yes                      +----------+------------+---------+-----------+----------+-------+  Left Findings: +----------+------------+---------+-----------+----------+-------+ LEFT      CompressiblePhasicitySpontaneousPropertiesSummary +----------+------------+---------+-----------+----------+-------+ IJV           Full       Yes       Yes                      +----------+------------+---------+-----------+----------+-------+ Subclavian    Full       Yes       Yes                      +----------+------------+---------+-----------+----------+-------+  Axillary      Full       Yes       Yes                      +----------+------------+---------+-----------+----------+-------+ Brachial      Full                                          +----------+------------+---------+-----------+----------+-------+ Radial        Full                                           +----------+------------+---------+-----------+----------+-------+ Ulnar         Full                                          +----------+------------+---------+-----------+----------+-------+ Cephalic      Full                                          +----------+------------+---------+-----------+----------+-------+ Basilic       Full                                          +----------+------------+---------+-----------+----------+-------+  Summary:  Right: No evidence of thrombosis in the subclavian.  Left: No evidence of deep vein thrombosis in the upper extremity. No evidence of superficial vein thrombosis in the upper extremity.  *See table(s) above for measurements and observations.  Diagnosing physician: Waverly Ferrari MD Electronically signed by Waverly Ferrari MD on 03/20/2023 at 4:52:51 PM.    Final    DG Chest Port 1 View  Result Date: 03/20/2023 CLINICAL DATA:  Pneumonia. EXAM: PORTABLE CHEST 1 VIEW COMPARISON:  03/18/2023 FINDINGS: The endotracheal tube tip is 4.2 cm from the carina. Weighted enteric tube tip below the diaphragm not included in the field of view. Right upper extremity/subclavian central line tip overlies the upper SVC. Again seen left lung base opacity no obscuration of hemidiaphragm. Stable heart size and mediastinal contours. Improving interstitial opacities. No pneumothorax IMPRESSION: 1. Improving interstitial opacities likely improving edema. 2. Unchanged left lung base opacity, favor pleural effusion and atelectasis. 3. Support apparatus unchanged in position. Electronically Signed   By: Narda Rutherford M.D.   On: 03/20/2023 11:09   CT HEAD WO CONTRAST ( )  Result Date: 03/20/2023 CLINICAL DATA:  Anoxic brain injury, 24 hours post TNK EXAM: CT HEAD WITHOUT CONTRAST TECHNIQUE: Contiguous axial images were obtained from the base of the skull through the vertex without intravenous contrast. RADIATION DOSE REDUCTION: This exam was performed  according to the departmental dose-optimization program which includes automated exposure control, adjustment of the mA and/or kV according to patient size and/or use of iterative reconstruction technique. COMPARISON:  03/12/2023 CT head FINDINGS: Brain: No evidence of acute infarction, hemorrhage, mass, mass effect, or midline shift. No hydrocephalus or extra-axial fluid collection. Basal ganglia calcifications. Age related cerebral atrophy. Periventricular white matter changes, likely the sequela of chronic small vessel ischemic disease.  Vascular: No hyperdense vessel. Atherosclerotic calcifications in the intracranial carotid and vertebral arteries. Skull: Negative for fracture or focal lesion. Sinuses/Orbits: Mucosal thickening throughout the paranasal sinuses. No acute finding in the orbits. Other: Fluid throughout the bilateral mastoid air cells. IMPRESSION: No acute intracranial process. Electronically Signed   By: Wiliam Ke M.D.   On: 03/20/2023 03:29    Anti-infectives: Anti-infectives (From admission, onward)    Start     Dose/Rate Route Frequency Ordered Stop   03/18/23 1400  cefTRIAXone (ROCEPHIN) 2 g in sodium chloride 0.9 % 100 mL IVPB        2 g 200 mL/hr over 30 Minutes Intravenous Every 24 hours 03/18/23 1314 03/22/23 2359   03/18/23 1400  metroNIDAZOLE (FLAGYL) IVPB 500 mg  Status:  Discontinued        500 mg 100 mL/hr over 60 Minutes Intravenous Every 12 hours 03/18/23 1314 03/19/23 0909   03/14/23 0700  cefTRIAXone (ROCEPHIN) 1 g in sodium chloride 0.9 % 100 mL IVPB  Status:  Discontinued        1 g 200 mL/hr over 30 Minutes Intravenous Daily 03/14/23 0621 03/14/23 0759   03/14/23 0630  azithromycin (ZITHROMAX) 500 mg in sodium chloride 0.9 % 250 mL IVPB  Status:  Discontinued        500 mg 250 mL/hr over 60 Minutes Intravenous Daily 03/14/23 0621 03/14/23 0759   03/04/23 1000  meropenem (MERREM) 1 g in sodium chloride 0.9 % 100 mL IVPB        1 g 200 mL/hr over 30 Minutes  Intravenous Every 12 hours 03/04/23 0855 03/10/23 2132   03/01/23 1500  ceFEPIme (MAXIPIME) 2 g in sodium chloride 0.9 % 100 mL IVPB        2 g 200 mL/hr over 30 Minutes Intravenous Every 12 hours 03/01/23 1124 03/02/23 0356   03/01/23 0300  ceFEPIme (MAXIPIME) 2 g in sodium chloride 0.9 % 100 mL IVPB  Status:  Discontinued        2 g 200 mL/hr over 30 Minutes Intravenous Every 24 hours 02/28/23 1141 03/01/23 1124   Mar 04, 2023 1445  piperacillin-tazobactam (ZOSYN) IVPB 3.375 g  Status:  Discontinued        3.375 g 100 mL/hr over 30 Minutes Intravenous Every 8 hours 2023/03/04 1436 Mar 04, 2023 1445   03/04/2023 1445  ceFEPIme (MAXIPIME) 2 g in sodium chloride 0.9 % 100 mL IVPB  Status:  Discontinued        2 g 200 mL/hr over 30 Minutes Intravenous Every 12 hours 03/04/23 1446 02/28/23 1141   Mar 04, 2023 1445  metroNIDAZOLE (FLAGYL) IVPB 500 mg        500 mg 100 mL/hr over 60 Minutes Intravenous Every 12 hours Mar 04, 2023 1446 03/02/23 1719   04-Mar-2023 1015  meropenem (MERREM) 1 g in sodium chloride 0.9 % 100 mL IVPB        1 g 200 mL/hr over 30 Minutes Intravenous  Once 03/04/23 0917 March 04, 2023 1743       Assessment/Plan: -POD#23 s/p Exploratory laparotomy small bowel resection and primary anastomosis 03-04-2023 Dr. Luisa Hart - approximately 120 cm of small bowel remaining. Flexiseal placed Friday for diarrhea- Banatrol 60 mL BID. Imodium 2 mg BID today per tube. Remove flexi seal as soon as able - dysphagia, failed SLP Eval 9/13. Recurrent aspiration. Cardiac arrest 9/15.  - daily most-to-dry dressings.  - on hep gtt for afib  -Cortrak on plain film appears to have pulled back into the stomach and does not appear  post-pyloric.  He is high-risk for aspiration in this setting.  Would recommend advancing to post pyloric prior to reinitiating TFs -plan for one-way extubation likely to be delayed til tomorrow as patient was just assessed by RT and has secretions and desat to 70s and appeared to have tired out.  May  leave OGT if felt warranted.  Film with no dilated stomach or small bowel.  Air in his colon.  Minimal output from OGT. -big key for post extubation if he improves, if he continues to have dysphagia and is unable to eat, he would need to continue post pyloric feeds with his cortrak, which is clearly not something he can DC with (unless to [facility]).  A g-tube would not be recommended due to his aspiration risk.  A j-tube would be unable to be placed due to so fresh postop.  Feeding would be very difficult for him.  D/w primary service today.  Letha Cape, PA-C  General & Trauma Surgery [facility], [place] [phone] for weekday/non holidays Check [email or web address] for coverage night/weekend/holidays

## 2023-03-21 NOTE — Progress Notes (Signed)
Nutrition Brief Note  Chart reviewed. Noted plan for one way-extubation with transition to comfort care.   No further nutrition interventions planned at this time. NG for decompression for comfort, no TF at this time Please re-consult as needed.   Romelle Starcher MS, RDN, LDN, CNSC Registered Dietitian 3 Clinical Nutrition RD Pager and On-Call Pager Number Located in [place]

## 2023-03-21 NOTE — Progress Notes (Signed)
There was consult for a PIV access for heparin drip. Heparin is infusing already on one of TL PICC. Lt. Arm is very swollen compared with the other arm. Question of DVT, but Doppler was done yesterday and no DVT according to patient's RN. Patient's RN wanted to put in a PIV access due to inaccurate heparin level. Placed Lt. Upper arm and informed patient's RN to close monitor IV site for any sign of infiltration. HS [employer]

## 2023-03-21 NOTE — Progress Notes (Signed)
PT Cancellation Note  Patient Details Name: Stephen Flynn MRN: [id number] DOB: 16-May-1928   Cancelled Treatment:    Reason Eval/Treat Not Completed: Other (comment). Per RN, family coming in to do a one way extubation this afternoon. PT to return as able, as appropriate to progress mobility however aware of the possibility of transitioning to comfort care.  Lewis Shock, PT, DPT Acute Rehabilitation Services Secure chat preferred Office #: [phone]    Iona Hansen 03/21/2023, 1:44 PM

## 2023-03-21 NOTE — Progress Notes (Signed)
ANTICOAGULATION CONSULT NOTE- Follow Up  Pharmacy Consult for Heparin Indication: atrial fibrillation  Allergies  Allergen Reactions   Penicillin G     Other reaction(s): Drowsy   Penicillins Hives    Did it involve swelling of the face/tongue/throat, SOB, or low BP? No Did it involve sudden or severe rash/hives, skin peeling, or any reaction on the inside of your mouth or nose? yes Did you need to seek medical attention at a hospital or doctor's office? yes When did it last happen?  child     If all above answers are "NO", may proceed with cephalosporin use.    Patient Measurements: Height: 5' 9.02" (175.3 cm) Weight: 107.2 kg (236 lb 5.3 oz) IBW/kg (Calculated) : 70.74 Heparin Dosing Weight: 95 kg  Vital Signs: Temp: 98 F (36.7 C) (09/18 1200) Temp Source: Oral (09/18 1200) BP: 131/79 (09/18 1408) Pulse Rate: 65 (09/18 1408)  Labs: Recent Labs    03/19/23 0328 03/19/23 1838 03/20/23 0425 03/20/23 1627 03/21/23 0247 03/21/23 0425 03/21/23 1234  HGB 8.3*  --  7.8*  --   --  6.8*  --   HCT 26.9*  --  25.2*  --   --  23.6*  --   PLT 202  --  186  --   --  164  --   HEPARINUNFRC <0.10*   < > <0.10* 0.18* 0.20*  --  0.32  CREATININE 1.21  --  1.24  --   --  1.50*  --    < > = values in this interval not displayed.    Estimated Creatinine Clearance: 37.1 mL/min (A) (by C-G formula based on SCr of 1.5 mg/dL (H)).   Assessment: 89 YOM s/p SBR on Xarelto PTA for atrial fibrillation.  Pharmacy consulted to dose IV heparin while Xarelto is on hold.    Heparin level 0.32 is therapeutic with heparin running at 1750 units/hr. Hgb (6.8) is low, 1 unit PRBC infused this morning, patient has been low stable this admission. PLTs (164) are stable. Per RN, no report of pauses, issues with the line, or signs of bleeding.   Goal of Therapy:  Heparin level 0.3-0.5 units/ml Monitor platelets by anticoagulation protocol: Yes   Plan:  Continue heparin infusion at 1750  units/hr Monitor daily heparin level and CBC Monitor for signs/symptoms of bleeding  Romie Minus, PharmD PGY1 Pharmacy Resident  Please check AMION for all [facility] Pharmacy phone numbers After 10:00 PM, call Main Pharmacy [phone]

## 2023-03-21 NOTE — Progress Notes (Signed)
ANTICOAGULATION CONSULT NOTE- Follow Up  Pharmacy Consult for Heparin Indication: atrial fibrillation  Allergies  Allergen Reactions   Penicillin G     Other reaction(s): Drowsy   Penicillins Hives    Did it involve swelling of the face/tongue/throat, SOB, or low BP? No Did it involve sudden or severe rash/hives, skin peeling, or any reaction on the inside of your mouth or nose? yes Did you need to seek medical attention at a hospital or doctor's office? yes When did it last happen?  child     If all above answers are "NO", may proceed with cephalosporin use.    Patient Measurements: Height: 5' 9.02" (175.3 cm) Weight: 107.2 kg (236 lb 5.3 oz) IBW/kg (Calculated) : 70.74 Heparin Dosing Weight: 95 kg  Vital Signs: Temp: 97.7 F (36.5 C) (09/18 0314) Temp Source: Axillary (09/18 0314) BP: 94/72 (09/18 0145) Pulse Rate: 65 (09/18 0145)  Labs: Recent Labs    03/18/23 1505 03/19/23 0328 03/19/23 1838 03/20/23 0425 03/20/23 1627 03/21/23 0247  HGB 8.6* 8.3*  --  7.8*  --   --   HCT 28.4* 26.9*  --  25.2*  --   --   PLT 165 202  --  186  --   --   HEPARINUNFRC  --  <0.10*   < > <0.10* 0.18* 0.20*  CREATININE 1.14 1.21  --  1.24  --   --    < > = values in this interval not displayed.    Estimated Creatinine Clearance: 44.9 mL/min (by C-G formula based on SCr of 1.24 mg/dL).   Assessment: 89 YOM s/p SBR on Xarelto PTA for atrial fibrillation.  Pharmacy consulted to dose IV heparin while Xarelto is on hold.    Heparin level sub-therapeutic at 0.2 on 1650 units/hr.  Lab drawn by phlebotomist and no issue with heparin infusion nor bleeding per discussion with RN. Last CBC shows Hgb at 7.8 and plts 186  Goal of Therapy:  Heparin level 0.3-0.5 units/ml Monitor platelets by anticoagulation protocol: Yes   Plan:  Increase heparin gtt to 1750 units/hr Check 8 hr heparin level Monitor heparin level and CBC daily  Arabella Merles, PharmD. Clinical Pharmacist 03/21/2023  3:29 AM

## 2023-03-21 NOTE — Progress Notes (Signed)
Daily Progress Note   Patient Name: Stephen Flynn       Date: 03/21/2023 DOB: 02-07-1928  Age: 89 y.o. MRN#: [id number] Attending Physician: Josephine Igo, DO Primary Care Physician: Creola Corn, MD Admit Date: 02/11/2023  Reason for Consultation/Follow-up: Establishing goals of care  Subjective: Medical records reviewed including progress notes, labs, imaging. Patient assessed at the bedside. He remains intubated with possible one-way extubation later this afternoon. Discussed with RN. No family present during my visit, expected to arrive around 2pm.  I called patient's daughter Lanae Crumbly for ongoing palliative support. Provided with update on cortrak positioning concerns and she is frustrated to hear this, as this has occurred on a few occasions before. She tells me the family will arrive at the bedside around 3pm and ends the phone call.    Questions and concerns addressed. PMT will continue to support holistically.   Length of Stay: 24  Physical Exam Vitals and nursing note reviewed.  Constitutional:      Appearance: He is ill-appearing.     Interventions: He is intubated.  Cardiovascular:     Rate and Rhythm: Normal rate.  Pulmonary:     Effort: Pulmonary effort is normal. No respiratory distress. He is intubated.  Neurological:     Mental Status: He is easily aroused.            Vital Signs: BP 137/71   Pulse 65   Temp 98 F (36.7 C) (Oral)   Resp 19   Ht 5' 9.02" (1.753 m)   Wt 107.2 kg   SpO2 96%   BMI 34.88 kg/m  SpO2: SpO2: 96 % O2 Device: O2 Device: Ventilator O2 Flow Rate: O2 Flow Rate (L/min): 2 L/min      Palliative Assessment/Data: 30% (tube feeds)    Palliative Care Assessment & Plan   Patient Profile: 89 y.o. male  with past medical history of  persistent A-fib on Eliquis, diastolic CHF, CVA, BPH, DM-2, thoracic AAA and debility with wheelchair dependence, poorly controlled hypertension admitted on 02/10/2023 with acute abdominal pain.    In the ED, patient was in A-fib with RVR and CT abdomen and pelvis showed small bowel obstruction, moderate ascites and BPH.  Initially was treated with conservative management and NG tube decompression for small bowel obstruction, however on 2023-03-01 he required pressor support  and underwent exploratory laparotomy and small bowel resection.  Echo showed EF of 40 to 45%.   Patient was extubated on 8/27 then reintubated on 9/1.  Most recently extubated on 9/6. PCCM has discussed possible tracheostomy with patient's family. PMT has been consulted to assist with goals of care conversation on hospital day 16 after transfer to the ICU.  Assessment: Goals of care conversation Small bowel obstruction status post exploratory laparotomy and small bowel resection with primary anastomosis A-fib with RVR Acute respiratory failure with hypoxia and hypercapnia, improved Acute metabolic and septic encephalopathy  Recommendations/Plan: Continue DNR, one-way extubation when ready Unable to solidify plan for tentative family meeting on 04-15-2023, will re-attempt as able PMT will continue to follow and support intermittently  Prognosis: Poor with high risk for further decline and decompensation  Discharge Planning: To Be Determined  Care plan was discussed with patient's daughter   Time: 25 minutes         Renada Cronin Jeni Salles, PA-C  Palliative Medicine Team Team phone # [phone]  Thank you for allowing the Palliative Medicine Team to assist in the care of this patient. Please utilize secure chat with additional questions, if there is no response within 30 minutes please call the above phone number.  Palliative Medicine Team providers are available by phone from 7am to 7pm daily and can be reached through  the team cell phone.  Should this patient require assistance outside of these hours, please call the patient's attending physician.

## 2023-03-21 NOTE — Progress Notes (Signed)
eLink Physician-Brief Progress Note Patient Name: Stephen Flynn DOB: 04/14/1928 MRN: [id number]   Date of Service  03/21/2023  HPI/Events of Note  Hgb 6.8 No signs of bleeding   eICU Interventions  Transfuse 1 unit PRBC     Intervention Category Intermediate Interventions: Diagnostic test evaluation  Darl Pikes 03/21/2023, 5:47 AM

## 2023-03-22 DIAGNOSIS — K56609 Unspecified intestinal obstruction, unspecified as to partial versus complete obstruction: Secondary | ICD-10-CM | POA: Diagnosis not present

## 2023-03-22 DIAGNOSIS — E440 Moderate protein-calorie malnutrition: Secondary | ICD-10-CM | POA: Diagnosis not present

## 2023-03-22 DIAGNOSIS — I420 Dilated cardiomyopathy: Secondary | ICD-10-CM | POA: Diagnosis not present

## 2023-03-22 DIAGNOSIS — J9600 Acute respiratory failure, unspecified whether with hypoxia or hypercapnia: Secondary | ICD-10-CM

## 2023-03-22 DIAGNOSIS — L8990 Pressure ulcer of unspecified site, unspecified stage: Secondary | ICD-10-CM | POA: Insufficient documentation

## 2023-03-22 DIAGNOSIS — J9601 Acute respiratory failure with hypoxia: Secondary | ICD-10-CM | POA: Diagnosis not present

## 2023-03-22 LAB — GLUCOSE, CAPILLARY
Glucose-Capillary: 78 mg/dL (ref 70–99)
Glucose-Capillary: 75 mg/dL (ref 70–99)
Glucose-Capillary: 82 mg/dL (ref 70–99)
Glucose-Capillary: 92 mg/dL (ref 70–99)

## 2023-03-22 LAB — CBC
HCT: 26.9 % — ABNORMAL LOW (ref 39.0–52.0)
Hemoglobin: 8.1 g/dL — ABNORMAL LOW (ref 13.0–17.0)
MCH: 29.3 pg (ref 26.0–34.0)
MCHC: 30.1 g/dL (ref 30.0–36.0)
MCV: 97.5 fL (ref 80.0–100.0)
Platelets: 177 10*3/uL (ref 150–400)
RBC: 2.76 MIL/uL — ABNORMAL LOW (ref 4.22–5.81)
RDW: 18.4 % — ABNORMAL HIGH (ref 11.5–15.5)
WBC: 6.1 10*3/uL (ref 4.0–10.5)
nRBC: 0 % (ref 0.0–0.2)

## 2023-03-22 LAB — BASIC METABOLIC PANEL
Anion gap: 10 (ref 5–15)
BUN: 71 mg/dL — ABNORMAL HIGH (ref 8–23)
CO2: 27 mmol/L (ref 22–32)
Calcium: 9.6 mg/dL (ref 8.9–10.3)
Chloride: 106 mmol/L (ref 98–111)
Creatinine, Ser: 1.77 mg/dL — ABNORMAL HIGH (ref 0.61–1.24)
GFR, Estimated: 35 mL/min — ABNORMAL LOW (ref >60)
Glucose, Bld: 84 mg/dL (ref 70–99)
Potassium: 4 mmol/L (ref 3.5–5.1)
Sodium: 143 mmol/L (ref 135–145)

## 2023-03-22 LAB — HEPARIN LEVEL (UNFRACTIONATED): Heparin Unfractionated: 0.4 IU/mL (ref 0.30–0.70)

## 2023-03-22 LAB — TYPE AND SCREEN
ABO/RH(D): O POS
Antibody Screen: NEGATIVE
Unit division: 0

## 2023-03-22 LAB — BPAM RBC
Blood Product Expiration Date: 202410162359
ISSUE DATE / TIME: 202409180832
Unit Type and Rh: 5100

## 2023-03-22 LAB — MAGNESIUM: Magnesium: 2.1 mg/dL (ref 1.7–2.4)

## 2023-03-22 MED ORDER — DEXTROSE 50 % IV SOLN
INTRAVENOUS | Status: AC
  Administered 2023-03-22: 12.5 g via INTRAVENOUS
  Filled 2023-03-22: qty 50

## 2023-03-22 MED ORDER — DEXTROSE 50 % IV SOLN: 12.5000 g | Freq: Once | INTRAVENOUS | Status: AC

## 2023-03-22 MED ORDER — DEXTROSE 50 % IV SOLN
12.5000 g | INTRAVENOUS | Status: AC
  Administered 2023-03-22: 12.5 g via INTRAVENOUS

## 2023-03-22 MED ORDER — FUROSEMIDE 10 MG/ML IJ SOLN
40.0000 mg | Freq: Once | INTRAMUSCULAR | Status: AC
  Administered 2023-03-22: 40 mg via INTRAVENOUS
  Filled 2023-03-22: qty 4

## 2023-03-22 MED ORDER — DEXTROSE 50 % IV SOLN
INTRAVENOUS | Status: AC
  Filled 2023-03-22: qty 50

## 2023-03-22 NOTE — Progress Notes (Signed)
OT Cancellation Note  Patient Details Name: Stephen Flynn MRN: [id number] DOB: Oct 16, 1927   Cancelled Treatment:    Reason Eval/Treat Not Completed: Other (comment).  Patient with possible one way extubation this date.  OT to hold and continue efforts as appropriate.    Daleyza Gadomski D Emonni Depasquale 03/22/2023, 9:34 AM 03/22/2023  RP, OTR/L  Acute Rehabilitation Services  Office:  [phone]

## 2023-03-22 NOTE — Progress Notes (Signed)
PT Cancellation Note  Patient Details Name: Rayane Teas MRN: [id number] DOB: May 02, 1928   Cancelled Treatment:    Reason Eval/Treat Not Completed: Patient not medically ready;Medical issues which prohibited therapy.  Per RN, still on HOLD today. 03/22/2023  Jacinto Halim., PT Acute Rehabilitation Services [phone]  (office)   Eliseo Gum Anitra Doxtater 03/22/2023, 12:27 PM

## 2023-03-22 NOTE — Progress Notes (Addendum)
NAMEPrimus Flynn, MRN:  [id number], DOB:  [birth year], LOS: 25 ADMISSION DATE:  02/27/2023, CONSULTATION DATE: 8/26 REFERRING MD: Dr. Randol Kern, CHIEF COMPLAINT: Hypotension  History of Present Illness:  Patient is encephalopathic and/or intubated; therefore, history has been obtained from chart review.  89 year old male with past medical history as below, which is significant for atrial fibrillation on Eliquis, wheelchair-bound, HFpEF, and stroke who presented to [facility] emergency department on 8/25 with complaints of acute onset abdominal pain with no bowel movement for 3 days prior to presentation.  Also sounds like he had not been consistently taking his Eliquis.  Upon arrival to the emergency department the patient was noted to be in atrial fibrillation with RVR to the 120s.  Started on diltiazem infusion and placed on heparin drip.  CT of the abdomen and pelvis demonstrated small bowel obstruction and moderate ascites.  General surgery was consulted and recommended conservative management with NG tube decompression.  He was admitted to the hospitalist service.  In the a.m. hours of 8/26 the patient developed waning mental status and became nonverbal.  He also developed hypotension with systolic pressures in the 70s and desaturation to the 80s.  His abdomen became more firm and rigid and exquisitely tender to pain.  The surgical team was at bedside and determined the patient would likely require urgent surgery.  PCCM was consulted for hypotension and hypoxia.  Pertinent  Medical History   has a past medical history of Anemia, Aortic insufficiency, Chest pain, H/O: GI bleed, Hyperlipidemia, Hypertension, Leg cramps, and Stroke (HCC).   Significant Hospital Events: Including procedures, antibiotic start and stop dates in addition to other pertinent events   8/25 admit for SBO 8/26 acute abdomen, tx to ICU for pressors. PICC line placed. Underwent Ex lap and small bowel  resection 8/27 Extubated 9/1 Reintubated > bronchoscopy, BAL >> few yeast 9/5 1u pRBC for Hgb 6.6 without signs of bleeding   9/6 extubated  9/10 transferred out of ICU and to [facility]  9/15 bradycardic to PEA arrest ~ down time, pupils 8 and fixed post arrest 9/16 awake, tolerating higher levels of pressure support.  Following commands with both extremities he is profoundly debilitated, discussed concern about deconditioning with his daughter and wife recommended DNI postextubation following optimization of volume status 9/17 a little more awake, still profoundly weak, SBT numbers acceptable however still massively volume overloaded, holding off on extubation for another 24 hours to promote volume removal  Interim History / Subjective:  Yesterday core track w/ bilious output; KUB ordered and tube appears to be in distal stomach Plan initially was for one way extubation but patient tired out requiring to go back on rate on vent and extubation put on hold  CVP today 14; UOP yesterday 1.6 Following commands on fentanyl drip Attempted SBT but low tidal volumes  Objective   Blood pressure (!) 141/87, pulse 61, temperature 97.9 F (36.6 C), temperature source Oral, resp. rate (!) 23, height 5' 9.02" (1.753 m), weight 105.7 kg, SpO2 100%. CVP:  [13 mmHg-15 mmHg] 15 mmHg    Vent Mode: PRVC FiO2 (%):  [40 %] 40 % Set Rate:  [18 bmp] 18 bmp Vt Set:  [560 mL] 560 mL PEEP:  [5 cmH20] 5 cmH20 Pressure Support:  [5 cmH20] 5 cmH20 Plateau Pressure:  [21 cmH20-26 cmH20] 25 cmH20   Intake/Output Summary (Last 24 hours) at 03/22/2023 0906 Last data filed at 03/22/2023 0800 Gross per 24 hour  Intake  1427.29 ml  Output 2100 ml  Net -672.71 ml   Filed Weights   03/20/23 0330 03/21/23 0500 03/22/23 0220  Weight: 107.2 kg 107.2 kg 105.7 kg    Examination: General: critically ill appearing male intubated on mech vent HEENT: MM pink/moist; ett in place Neuro: Awake and following some  commands CV: s1s2, RRR, no m/r/g PULM:  dim rhonchi BS bilaterally; on mech vent PRVC GI: soft, wound vac dressing in place Extremities: warm/dry, diffuse anasarca Skin: no rashes or lesions   Resolved Hospital Problem list   AKI Hypernatremia  In hospital bradycardic to PEA arrest. (15 minute down time) Assessment & Plan:   HFrEF (40-45% w/ global hypokinesis now s/p in house cardiac arrest 9/15 w/drug related hypotension Plan: -currently off levo; sbp goal >100 -daily weights; strict I/O's -lasix this am; monitor UOP and CVP -unable to start GDMT w/ soft bp  Paroxysmal atrial fibrillation  Plan: -tele monitoring -rate currently controlled -cont iv heparin  Acute Hypoxic Respiratory Failure due to cardiopulmonary arrest c/b Recurrent aspiration pneumonia  MRSA PCR was negative Sputum still reincubating max temp 98.6 over the last 24 hours Plan: -will work on weaning sedation and reattempt SBT -CVP 14; will give lasix this am -Wean PEEP/FiO2 for SpO2 >92% -VAP bundle in place -Daily SAT and SBT -PAD protocol in place -wean sedation for RASS goal 0 to -1 -Follow intermittent CXR -cont rocephin for 5 days total; follow sputum culture  Small bowel obstruction s/p ex lap and small bowel resection 03-28-2023 Plan: -Per CCS -replace og for ng; hold on tube feeds for now -will need core track replacement tomorrow for post pyloric placement -wound vac care per CCS  Acute metabolic encephalopathy w/ Concern for anoxic brain injury s/p cardiac arrest Following commands but profoundly weak and debilitated.  CT head negative for acute injury Plan: -improved -limit sedating meds -wean sedation for RASS 0 to -1  LUE swelling r/o DVT Plan: -no evidence of DVT on dvt US -elevate extremity  Mild AKI s/p cardiac arrest Total body volume overload Tolerating active diuresis with albumin Lasix combo Plan: -lasix today -Trend BMP / urinary output -Replace electrolytes as  indicated -Avoid nephrotoxic agents, ensure adequate renal perfusion  Anemia, due to critical illness superimposed on iron deficiency anemia  Hgb trending down some, but no obvious bleeding Plan: -trend cbc  Diabetes mellitus, controlled Hemoglobin A1c 6.5, better glycemic control following changed to moderate sliding scale Plan: -ssi and cbg monitoring  Moderate malnutrition Baseline debility: Wheelchair-bound Plan: -RD following for nutrition requirements  Best Practice (right click and "Reselect all SmartList Selections" daily)   Diet/type: NPO  DVT prophylaxis: Systemic heparin GI prophylaxis: PPI Lines: N/A Foley:  Yes, and it is still needed Code Status:  DNR Last date of multidisciplinary goals of care discussion 9/19 updated daughter that patient is still not quite ready for extubation today and not passing SBT. Recommended that family come in some time today to discuss GOC. If patient still not able to get extubated in next few days I recommended we may need to consider transitioning to comfort care this weekend or early next week. Daughter states she will speak with her family and she understands patient would not want to be on ventilator long term. She may try to bring mother this afternoon and we can discuss family decisions moving forward. Will continue GOC conversations.  My cct 35 minutes  03/22/2023, 9:06 AM  JD Anselm Lis [facility] 03/22/2023, 9:06 AM  Please see [email or web address] for pager details.  From 7A-7P if no response, please call [phone]. After hours, please call ELink [phone].

## 2023-03-22 NOTE — Progress Notes (Signed)
Daily Progress Note   Patient Name: Stephen Flynn       Date: 03/22/2023 DOB: July 11, 1927  Age: 89 y.o. MRN#: [id number] Attending Physician: Josephine Igo, DO Primary Care Physician: Creola Corn, MD Admit Date: 02/16/2023  Reason for Consultation/Follow-up: Establishing goals of care  Subjective: Medical records reviewed including progress notes, labs, imaging. Patient assessed at the bedside.  He is intubated, looks very uncomfortable.  Discussed with RN.  No family present during my visit.  Called patient's daughter Lanae Crumbly to continue attempts of coordinating a family meeting.  Shared my understanding from our last conversation was that it may be possible for all family members to convene on tomorrow/Friday, with her brother [place] visiting from [place].  She tells me that her brother is quite upset with the care patient has received and she is unsure whether he will be coming or not.  She is trying to help him "pull it together" and participate as much as possible.  She states that other family members may still come tomorrow.  She will call back if able to solidify a meeting for goals of care discussion with or without his presence.  Questions and concerns addressed. PMT will continue to support holistically.   Length of Stay: 25  Physical Exam Vitals and nursing note reviewed.  Constitutional:      Appearance: He is ill-appearing.     Interventions: He is intubated.  Cardiovascular:     Rate and Rhythm: Normal rate.  Pulmonary:     Effort: Pulmonary effort is normal. No respiratory distress. He is intubated.  Neurological:     Mental Status: He is alert.            Vital Signs: BP (!) 141/87   Pulse 61   Temp 97.9 F (36.6 C) (Oral)   Resp (!) 23   Ht 5' 9.02" (1.753 m)    Wt 105.7 kg   SpO2 100%   BMI 34.40 kg/m  SpO2: SpO2: 100 % O2 Device: O2 Device: Ventilator O2 Flow Rate: O2 Flow Rate (L/min): 2 L/min      Palliative Assessment/Data: 10%    Palliative Care Assessment & Plan   Patient Profile: 89 y.o. male  with past medical history of persistent A-fib on Eliquis, diastolic CHF, CVA, BPH, DM-2, thoracic AAA and debility with wheelchair  dependence, poorly controlled hypertension admitted on 02/08/2023 with acute abdominal pain.    In the ED, patient was in A-fib with RVR and CT abdomen and pelvis showed small bowel obstruction, moderate ascites and BPH.  Initially was treated with conservative management and NG tube decompression for small bowel obstruction, however on 8/26 he required pressor support and underwent exploratory laparotomy and small bowel resection.  Echo showed EF of 40 to 45%.   Patient was extubated on 8/27 then reintubated on 9/1.  Most recently extubated on 9/6. PCCM has discussed possible tracheostomy with patient's family. PMT has been consulted to assist with goals of care conversation on hospital day 16 after transfer to the ICU.  Assessment: Goals of care conversation Small bowel obstruction status post exploratory laparotomy and small bowel resection with primary anastomosis A-fib with RVR Acute respiratory failure with hypoxia and hypercapnia, improved Acute metabolic and septic encephalopathy  Recommendations/Plan: Continue DNR, one-way extubation when ready Patient's daughter is no longer certain whether a family meeting can occur on 2023-04-08.  She prefers to reach out to PMT if a preferred date/time can be arranged PMT will follow peripherally and await a return call from patient's daughter per her request  Prognosis: Poor with high risk for further decline and decompensation  Discharge Planning: To Be Determined  Care plan was discussed with patient's daughter, RN   Time: 35 minutes         Misao Fackrell Jeni Salles, PA-C  Palliative Medicine Team Team phone # [phone]  Thank you for allowing the Palliative Medicine Team to assist in the care of this patient. Please utilize secure chat with additional questions, if there is no response within 30 minutes please call the above phone number.  Palliative Medicine Team providers are available by phone from 7am to 7pm daily and can be reached through the team cell phone.  Should this patient require assistance outside of these hours, please call the patient's attending physician.

## 2023-03-22 NOTE — Progress Notes (Addendum)
24 Days Post-Op   Subjective/Chief Complaint: Planned for extubation today, one-way.  Per RN, unclear if family is going to come today Cortrak remains in distal stomach   Objective: Vital signs in last 24 hours: Temp:  [97.6 F (36.4 C)-98.5 F (36.9 C)] 97.9 F (36.6 C) (09/19 0754) Pulse Rate:  [53-82] 61 (09/19 0800) Resp:  [11-25] 23 (09/19 0800) BP: (95-163)/(60-133) 141/87 (09/19 0800) SpO2:  [88 %-100 %] 100 % (09/19 0801) FiO2 (%):  [40 %] 40 % (09/19 0801) Weight:  [105.7 kg] 105.7 kg (09/19 0220) Last BM Date : 03/22/23  Intake/Output from previous day: 09/18 0701 - 09/19 0700 In: 1733.3 [I.V.:952.3; Blood:486; NG/GT:120; IV Piggyback:100] Out: 2405 [Urine:1615; Emesis/NG output:50; Stool:740] Intake/Output this shift: Total I/O In: -  Out: 45 [Stool:45]  PE: Gen: alert, FC, appears uncomfortable Respiratory: on vent Chest wall tenderness Abd: soft, ND, Cortrak in place - clamped, OG in place draining cloudy bilious effluent - looks like tube feeds mixed with bile.    Lab Results:  Recent Labs    03/21/23 1445 03/22/23 0425  WBC 4.6 6.1  HGB 7.5* 8.1*  HCT 24.5* 26.9*  PLT 155 177   BMET Recent Labs    03/21/23 0425 03/22/23 0425  NA 142 143  K 3.9 4.0  CL 107 106  CO2 30 27  GLUCOSE 86 84  BUN 64* 71*  CREATININE 1.50* 1.77*  CALCIUM 8.6* 9.6   PT/INR No results for input(s): "LABPROT", "INR" in the last 72 hours.  ABG No results for input(s): "PHART", "HCO3" in the last 72 hours.  Invalid input(s): "PCO2", "PO2"   Studies/Results: DG Abd 1 View  Result Date: 03/21/2023 CLINICAL DATA:  Orogastric tube placement. EXAM: ABDOMEN - 1 VIEW COMPARISON:  March 14, 2023. FINDINGS: Distal tip of feeding tube is seen in expected position of distal stomach. IMPRESSION: Distal tip of feeding tube seen in expected position of distal stomach. Electronically Signed   By: Lupita Raider M.D.   On: 03/21/2023 14:57   VAS [place] UPPER EXTREMITY  VENOUS DUPLEX  Result Date: 03/20/2023 UPPER VENOUS STUDY  Patient Name:  Stephen Flynn  Date of Exam:   03/20/2023 Medical Rec #: [id number]      Accession #:    [id number] Date of Birth: 09-Jun-1928       Patient Gender: M Patient Age:   54 years Exam Location:  [facility] Procedure:      VAS [place] UPPER EXTREMITY VENOUS DUPLEX Referring Phys: Zenia Resides --------------------------------------------------------------------------------  Indications: Swelling Risk Factors: None identified. Anticoagulation: Heparin. Limitations: Bandages, patient positioning, patient immobility and line. Comparison Study: No prior studies. Performing Technologist: Chanda Busing RVT  Examination Guidelines: A complete evaluation includes B-mode imaging, spectral Doppler, color Doppler, and power Doppler as needed of all accessible portions of each vessel. Bilateral testing is considered an integral part of a complete examination. Limited examinations for reoccurring indications may be performed as noted.  Right Findings: +----------+------------+---------+-----------+----------+-------+ RIGHT     CompressiblePhasicitySpontaneousPropertiesSummary +----------+------------+---------+-----------+----------+-------+ Subclavian    Full       Yes       Yes                      +----------+------------+---------+-----------+----------+-------+  Left Findings: +----------+------------+---------+-----------+----------+-------+ LEFT      CompressiblePhasicitySpontaneousPropertiesSummary +----------+------------+---------+-----------+----------+-------+ IJV           Full       Yes       Yes                      +----------+------------+---------+-----------+----------+-------+  Subclavian    Full       Yes       Yes                      +----------+------------+---------+-----------+----------+-------+ Axillary      Full       Yes       Yes                       +----------+------------+---------+-----------+----------+-------+ Brachial      Full                                          +----------+------------+---------+-----------+----------+-------+ Radial        Full                                          +----------+------------+---------+-----------+----------+-------+ Ulnar         Full                                          +----------+------------+---------+-----------+----------+-------+ Cephalic      Full                                          +----------+------------+---------+-----------+----------+-------+ Basilic       Full                                          +----------+------------+---------+-----------+----------+-------+  Summary:  Right: No evidence of thrombosis in the subclavian.  Left: No evidence of deep vein thrombosis in the upper extremity. No evidence of superficial vein thrombosis in the upper extremity.  *See table(s) above for measurements and observations.  Diagnosing physician: Waverly Ferrari MD Electronically signed by Waverly Ferrari MD on 03/20/2023 at 4:52:51 PM.    Final     Anti-infectives: Anti-infectives (From admission, onward)    Start     Dose/Rate Route Frequency Ordered Stop   03/18/23 1400  cefTRIAXone (ROCEPHIN) 2 g in sodium chloride 0.9 % 100 mL IVPB        2 g 200 mL/hr over 30 Minutes Intravenous Every 24 hours 03/18/23 1314 03/22/23 2359   03/18/23 1400  metroNIDAZOLE (FLAGYL) IVPB 500 mg  Status:  Discontinued        500 mg 100 mL/hr over 60 Minutes Intravenous Every 12 hours 03/18/23 1314 03/19/23 0909   03/14/23 0700  cefTRIAXone (ROCEPHIN) 1 g in sodium chloride 0.9 % 100 mL IVPB  Status:  Discontinued        1 g 200 mL/hr over 30 Minutes Intravenous Daily 03/14/23 0621 03/14/23 0759   03/14/23 0630  azithromycin (ZITHROMAX) 500 mg in sodium chloride 0.9 % 250 mL IVPB  Status:  Discontinued        500 mg 250 mL/hr over 60 Minutes Intravenous Daily  03/14/23 0621 03/14/23 0759   03/04/23 1000  meropenem (MERREM) 1 g in sodium chloride 0.9 % 100 mL IVPB  1 g 200 mL/hr over 30 Minutes Intravenous Every 12 hours 03/04/23 0855 03/10/23 2132   03/01/23 1500  ceFEPIme (MAXIPIME) 2 g in sodium chloride 0.9 % 100 mL IVPB        2 g 200 mL/hr over 30 Minutes Intravenous Every 12 hours 03/01/23 1124 03/02/23 0356   03/01/23 0300  ceFEPIme (MAXIPIME) 2 g in sodium chloride 0.9 % 100 mL IVPB  Status:  Discontinued        2 g 200 mL/hr over 30 Minutes Intravenous Every 24 hours 02/28/23 1141 03/01/23 1124   02/17/2023 1445  piperacillin-tazobactam (ZOSYN) IVPB 3.375 g  Status:  Discontinued        3.375 g 100 mL/hr over 30 Minutes Intravenous Every 8 hours 02/08/2023 1436 02/08/2023 1445   02/23/2023 1445  ceFEPIme (MAXIPIME) 2 g in sodium chloride 0.9 % 100 mL IVPB  Status:  Discontinued        2 g 200 mL/hr over 30 Minutes Intravenous Every 12 hours 02/15/2023 1446 02/28/23 1141   02/15/2023 1445  metroNIDAZOLE (FLAGYL) IVPB 500 mg        500 mg 100 mL/hr over 60 Minutes Intravenous Every 12 hours 02/28/2023 1446 03/02/23 1719   02/20/2023 1015  meropenem (MERREM) 1 g in sodium chloride 0.9 % 100 mL IVPB        1 g 200 mL/hr over 30 Minutes Intravenous  Once 02/12/2023 0917 02/18/2023 1743       Assessment/Plan: -POD#24 s/p Exploratory laparotomy small bowel resection and primary anastomosis 8/26 Dr. Luisa Hart - approximately 120 cm of small bowel remaining. Flexiseal placed Friday for diarrhea- Banatrol 60 mL BID. Imodium 2 mg BID today per tube. Remove flexi seal as soon as able - dysphagia, failed SLP Eval 9/13. Recurrent aspiration. Cardiac arrest 9/15.  - daily most-to-dry dressings.  - on hep gtt for afib  -Cortrak on plain film appears to have pulled back into the stomach and does not appear post-pyloric.  He is high-risk for aspiration in this setting.  Would recommend NG tube placement prior to extubation today - continue NG to LIWS until cortrak  can be advanced to post pyloric region. Continue to hold tube feeds.  -plan for one-way extubation likely to be delayed til tomorrow as patient was just assessed by RT and has secretions and desat to 70s and appeared to have tired out.   Film with no dilated stomach or small bowel.  Air in his colon.  -big key for post extubation if he improves, if he continues to have dysphagia and is unable to eat, he would need to continue post pyloric feeds with his cortrak, which is clearly not something he can DC with (unless to LTAC).  If he needed permanent feeding access he may be a candidate for an IR gastrostomy tube that could then be converting to a GJ tube for jejunal feeds. D/w primary service, Dr. Tonia Brooms,  today.    Adam Phenix, PA-C  General & Trauma Surgery [facility], [place] [phone] for weekday/non holidays Check [email or web address] for coverage night/weekend/holidays

## 2023-03-22 NOTE — Plan of Care (Signed)
  Problem: Metabolic: Goal: Ability to maintain appropriate glucose levels will improve Outcome: Progressing   Problem: Skin Integrity: Goal: Risk for impaired skin integrity will decrease Outcome: Progressing   Problem: Tissue Perfusion: Goal: Adequacy of tissue perfusion will improve Outcome: Progressing

## 2023-03-22 NOTE — Plan of Care (Signed)

## 2023-03-22 NOTE — Progress Notes (Signed)
PCCM:  I spoke with patient's family at bedside. The patients wife was present for discussion. They have all agreed that they would like to extubate to comfort measures.  There are two sons that they would like to call and see if they would like to be present.   Family plans to convene tomorrow. The patient wife does not want to be present after he is extubated. She is ok with being in the waiting room.   Josephine Igo, DO [facility] 03/22/2023 6:21 PM

## 2023-03-22 NOTE — Progress Notes (Signed)
ANTICOAGULATION CONSULT NOTE- Follow Up  Pharmacy Consult for Heparin Indication: atrial fibrillation  Allergies  Allergen Reactions   Penicillin G     Other reaction(s): Drowsy   Penicillins Hives    Did it involve swelling of the face/tongue/throat, SOB, or low BP? No Did it involve sudden or severe rash/hives, skin peeling, or any reaction on the inside of your mouth or nose? yes Did you need to seek medical attention at a hospital or doctor's office? yes When did it last happen?  child     If all above answers are "NO", may proceed with cephalosporin use.    Patient Measurements: Height: 5' 9.02" (175.3 cm) Weight: 105.7 kg (233 lb 0.4 oz) IBW/kg (Calculated) : 70.74 Heparin Dosing Weight: 95 kg  Vital Signs: Temp: 98 F (36.7 C) (09/19 0400) Temp Source: Axillary (09/19 0400) BP: 154/77 (09/19 0600) Pulse Rate: 57 (09/19 0600)  Labs: Recent Labs    03/20/23 0425 03/20/23 1627 03/21/23 0247 03/21/23 0425 03/21/23 1234 03/21/23 1445 03/22/23 0425  HGB 7.8*  --   --  6.8*  --  7.5* 8.1*  HCT 25.2*  --   --  23.6*  --  24.5* 26.9*  PLT 186  --   --  164  --  155 177  HEPARINUNFRC <0.10*   < > 0.20*  --  0.32  --  0.40  CREATININE 1.24  --   --  1.50*  --   --  1.77*   < > = values in this interval not displayed.    Estimated Creatinine Clearance: 31.2 mL/min (A) (by C-G formula based on SCr of 1.77 mg/dL (H)).   Assessment: 89 YOM s/p SBR on Xarelto PTA for atrial fibrillation.  Pharmacy consulted to dose IV heparin while Xarelto is on hold.    Heparin level 0.40 is therapeutic with heparin running at 1750 units/hr. Hgb (8.1) is low stable, 1 unit PRBC infused 9/18. PLTs (177) are stable. No signs of bleeding noted.   Goal of Therapy:  Heparin level 0.3-0.5 units/ml Monitor platelets by anticoagulation protocol: Yes   Plan:  Continue heparin infusion at 1750 units/hr Monitor daily heparin level and CBC Monitor for signs/symptoms of bleeding  Romie Minus, PharmD PGY1 Pharmacy Resident  Please check AMION for all [facility] phone numbers After 10:00 PM, call Main Pharmacy [phone]

## 2023-03-23 ENCOUNTER — Inpatient Hospital Stay (HOSPITAL_COMMUNITY): Payer: 59

## 2023-03-23 DIAGNOSIS — Z66 Do not resuscitate: Secondary | ICD-10-CM | POA: Insufficient documentation

## 2023-03-23 DIAGNOSIS — K56609 Unspecified intestinal obstruction, unspecified as to partial versus complete obstruction: Secondary | ICD-10-CM | POA: Diagnosis not present

## 2023-03-23 LAB — CBC
HCT: 25.4 % — ABNORMAL LOW (ref 39.0–52.0)
Hemoglobin: 7.9 g/dL — ABNORMAL LOW (ref 13.0–17.0)
MCH: 30.6 pg (ref 26.0–34.0)
MCHC: 31.1 g/dL (ref 30.0–36.0)
MCV: 98.4 fL (ref 80.0–100.0)
Platelets: 185 10*3/uL (ref 150–400)
RBC: 2.58 MIL/uL — ABNORMAL LOW (ref 4.22–5.81)
RDW: 18.2 % — ABNORMAL HIGH (ref 11.5–15.5)
WBC: 5.7 10*3/uL (ref 4.0–10.5)
nRBC: 0 % (ref 0.0–0.2)

## 2023-03-23 LAB — BASIC METABOLIC PANEL
Anion gap: 10 (ref 5–15)
BUN: 67 mg/dL — ABNORMAL HIGH (ref 8–23)
CO2: 25 mmol/L (ref 22–32)
Calcium: 9.1 mg/dL (ref 8.9–10.3)
Chloride: 109 mmol/L (ref 98–111)
Creatinine, Ser: 1.86 mg/dL — ABNORMAL HIGH (ref 0.61–1.24)
GFR, Estimated: 33 mL/min — ABNORMAL LOW (ref >60)
Glucose, Bld: 82 mg/dL (ref 70–99)
Potassium: 3.6 mmol/L (ref 3.5–5.1)
Sodium: 144 mmol/L (ref 135–145)

## 2023-03-23 LAB — GLUCOSE, CAPILLARY
Glucose-Capillary: 68 mg/dL — ABNORMAL LOW (ref 70–99)
Glucose-Capillary: 84 mg/dL (ref 70–99)
Glucose-Capillary: 94 mg/dL (ref 70–99)

## 2023-03-23 LAB — MAGNESIUM: Magnesium: 2 mg/dL (ref 1.7–2.4)

## 2023-03-23 LAB — HEPARIN LEVEL (UNFRACTIONATED): Heparin Unfractionated: 0.37 IU/mL (ref 0.30–0.70)

## 2023-03-23 MED ORDER — GLYCOPYRROLATE 0.2 MG/ML IJ SOLN
0.2000 mg | INTRAMUSCULAR | Status: DC | PRN
  Administered 2023-03-23: 0.2 mg via INTRAVENOUS
  Filled 2023-03-23: qty 1

## 2023-03-23 MED ORDER — POTASSIUM CHLORIDE 10 MEQ/50ML IV SOLN
10.0000 meq | INTRAVENOUS | Status: AC
  Administered 2023-03-23 (×4): 10 meq via INTRAVENOUS
  Filled 2023-03-23 (×4): qty 50

## 2023-03-23 MED ORDER — DEXTROSE IN LACTATED RINGERS 5 % IV SOLN: INTRAVENOUS | Status: DC

## 2023-03-23 MED ORDER — GLYCOPYRROLATE 1 MG PO TABS: 1.0000 mg | ORAL_TABLET | ORAL | Status: DC | PRN

## 2023-03-23 MED ORDER — ACETAMINOPHEN 325 MG PO TABS: 650.0000 mg | ORAL_TABLET | Freq: Four times a day (QID) | ORAL | Status: DC | PRN

## 2023-03-23 MED ORDER — SCOPOLAMINE 1 MG/3DAYS TD PT72
1.0000 | MEDICATED_PATCH | TRANSDERMAL | Status: DC
  Administered 2023-03-23: 1.5 mg via TRANSDERMAL
  Filled 2023-03-23: qty 1

## 2023-03-23 MED ORDER — DEXTROSE 50 % IV SOLN
INTRAVENOUS | Status: AC
  Filled 2023-03-23: qty 50

## 2023-03-23 MED ORDER — FUROSEMIDE 10 MG/ML IJ SOLN
40.0000 mg | Freq: Four times a day (QID) | INTRAMUSCULAR | Status: DC
  Administered 2023-03-23: 40 mg via INTRAVENOUS
  Filled 2023-03-23: qty 4

## 2023-03-23 MED ORDER — LORAZEPAM 2 MG/ML IJ SOLN: 2.0000 mg | INTRAMUSCULAR | Status: DC | PRN

## 2023-03-23 MED ORDER — POLYVINYL ALCOHOL 1.4 % OP SOLN: 1.0000 [drp] | Freq: Four times a day (QID) | OPHTHALMIC | Status: DC | PRN

## 2023-03-23 MED ORDER — POTASSIUM CHLORIDE 20 MEQ PO PACK: 40.0000 meq | PACK | Freq: Once | ORAL | Status: DC

## 2023-03-23 MED ORDER — DEXTROSE 50 % IV SOLN
1.0000 | Freq: Once | INTRAVENOUS | Status: AC
  Administered 2023-03-23: 50 mL via INTRAVENOUS

## 2023-03-23 MED ORDER — ACETAMINOPHEN 650 MG RE SUPP: 650.0000 mg | Freq: Four times a day (QID) | RECTAL | Status: DC | PRN

## 2023-03-23 MED ORDER — GLYCOPYRROLATE 0.2 MG/ML IJ SOLN: 0.2000 mg | INTRAMUSCULAR | Status: DC | PRN

## 2023-03-27 LAB — GLUCOSE, CAPILLARY
Glucose-Capillary: 69 mg/dL — ABNORMAL LOW (ref 70–99)
Glucose-Capillary: 94 mg/dL (ref 70–99)
Glucose-Capillary: 68 mg/dL — ABNORMAL LOW (ref 70–99)
Glucose-Capillary: 76 mg/dL (ref 70–99)

## 2023-04-03 NOTE — Progress Notes (Signed)
Appears comfortable post extubation  Will re-assess around 1800. If we are able to stay on top of his symptoms and stable we will transfer to medsurg   Simonne Martinet ACNP-BC [facility]monary/Critical Care Pager # [phone] OR # [phone] if no answer

## 2023-04-03 NOTE — Progress Notes (Signed)
ANTICOAGULATION CONSULT NOTE- Follow Up  Pharmacy Consult for Heparin Indication: atrial fibrillation  Allergies  Allergen Reactions   Penicillin G     Other reaction(s): Drowsy   Penicillins Hives    Did it involve swelling of the face/tongue/throat, SOB, or low BP? No Did it involve sudden or severe rash/hives, skin peeling, or any reaction on the inside of your mouth or nose? yes Did you need to seek medical attention at a hospital or doctor's office? yes When did it last happen?  child     If all above answers are "NO", may proceed with cephalosporin use.    Patient Measurements: Height: 5' 9.02" (175.3 cm) Weight: 105.6 kg (232 lb 12.9 oz) IBW/kg (Calculated) : 70.74 Heparin Dosing Weight: 95 kg  Vital Signs: Temp: 98.3 F (36.8 C) (09/20 0400) Temp Source: Axillary (09/20 0400) BP: 128/64 (09/20 0800) Pulse Rate: 67 (09/20 0800)  Labs: Recent Labs    03/21/23 0425 03/21/23 1234 03/21/23 1445 03/22/23 0425 03/04/2023 0436  HGB 6.8*  --  7.5* 8.1* 7.9*  HCT 23.6*  --  24.5* 26.9* 25.4*  PLT 164  --  155 177 185  HEPARINUNFRC  --  0.32  --  0.40 0.37  CREATININE 1.50*  --   --  1.77* 1.86*    Estimated Creatinine Clearance: 29.7 mL/min (A) (by C-G formula based on SCr of 1.86 mg/dL (H)).   Assessment: 89 YOM s/p SBR on Xarelto PTA for atrial fibrillation.  Pharmacy consulted to dose IV heparin while Xarelto is on hold.    Heparin level 0.37 is therapeutic with heparin running at 1750 units/hr. Hgb (7.9) is low stable, 1 unit PRBC infused 9/18. PLTs (185) are stable. No signs of bleeding noted.   Patient possibly transitioning to comfort care, will discontinue heparin at this time per CCM.  Goal of Therapy:  Heparin level 0.3-0.5 units/ml Monitor platelets by anticoagulation protocol: Yes   Plan:  Stop heparin infusion  Romie Minus, PharmD PGY1 Pharmacy Resident  Please check AMION for all [facility] Pharm[facility] phone numbers After 10:00 PM, call Main  Pharmacy [phone]

## 2023-04-03 NOTE — Progress Notes (Signed)
Noted plans for extubation today to comfort measures.  Agree with this plan at this time based on how patient is doing.  We will plan to be available as needed, but will sign off at this time.    Letha Cape 7:46 AM 03/29/2023

## 2023-04-03 NOTE — IPAL (Signed)
Interdisciplinary Goals of Care Family Meeting   Date carried out: 03/22/2023  Location of the meeting: Bedside  Member's involved: Nurse Practitioner, Bedside Registered Nurse, and Family Member or next of kin  Durable Power of Attorney or acting medical decision maker: Spouse Doris Polimeni Daughter Eber Jones Also present was Mr Sturkie son   Discussion: We discussed goals of care for [employer] .  Family arrived at bedside earlier today.  I spent several minutes again reviewing hospital course, the patients decline in spite of aggressive interventions and the reality that even WOB on weaning mode was challenging.  His volume status has been optimized.  His aspiration PNA treated.   Family again confirms (wife, daughter and son at bedside) that he would NOT want prolonged mechanical ventilation and that trach/PEG/long term care would not be in line with his values.   Family all in agreement to proceed w/ extubation with plan that on-going care would be geared towards comfort.   I specifically discussed no further feedings.  Pain management and symptom based care. I do think hospital death is imminent   Code status:   Code Status: Do not attempt resuscitation (DNR) - Comfort care   Disposition: In-patient comfort care  Time spent for the meeting: 23 min     Shelby Mattocks, NP  03/11/2023, 3:56 PM

## 2023-04-03 NOTE — Procedures (Signed)
Extubation Procedure Note  Patient Details:   Name: Rakan Limes DOB: Mar 12, 1928 MRN: [id number]   Airway Documentation:    Vent end date: 03/05/2023 Vent end time: 1525   Evaluation  O2 sats: currently acceptable Complications: No apparent complications Patient did tolerate procedure well. Bilateral Breath Sounds: Clear   No  Pt was extubated per Md order. Cuff leak was noted prior to extubation and no stridor post.    Merlene Laughter 03/08/2023, 3:33 PM

## 2023-04-03 NOTE — Death Summary Note (Signed)
Basilic       Full                                          +----------+------------+---------+-----------+----------+-------+  Summary:  Right: No evidence of thrombosis in the subclavian.  Left: No evidence of deep vein thrombosis in the upper extremity. No evidence of superficial vein thrombosis in the upper extremity.  *See table(s) above for measurements and observations.  Diagnosing physician: Waverly Ferrari MD Electronically signed by Waverly Ferrari MD on 03/20/2023 at 4:52:51 PM.    Final    DG Chest Port 1 View  Result Date: 03/20/2023 CLINICAL DATA:  Pneumonia. EXAM: PORTABLE CHEST 1 VIEW COMPARISON:  03/18/2023 FINDINGS: The endotracheal tube tip is 4.2 cm from the carina. Weighted enteric tube tip below the diaphragm not included in the field of view. Right upper extremity/subclavian central line tip overlies the upper SVC. Again seen left lung base opacity no obscuration of hemidiaphragm. Stable heart size and mediastinal contours. Improving interstitial opacities. No pneumothorax  IMPRESSION: 1. Improving interstitial opacities likely improving edema. 2. Unchanged left lung base opacity, favor pleural effusion and atelectasis. 3. Support apparatus unchanged in position. Electronically Signed   By: Narda Rutherford M.D.   On: 03/20/2023 11:09   CT HEAD WO CONTRAST ( )  Result Date: 03/20/2023 CLINICAL DATA:  Anoxic brain injury, 24 hours post TNK EXAM: CT HEAD WITHOUT CONTRAST TECHNIQUE: Contiguous axial images were obtained from the base of the skull through the vertex without intravenous contrast. RADIATION DOSE REDUCTION: This exam was performed according to the departmental dose-optimization program which includes automated exposure control, adjustment of the mA and/or kV according to patient size and/or use of iterative reconstruction technique. COMPARISON:  03/12/2023 CT head FINDINGS: Brain: No evidence of acute infarction, hemorrhage, mass, mass effect, or midline shift. No hydrocephalus or extra-axial fluid collection. Basal ganglia calcifications. Age related cerebral atrophy. Periventricular white matter changes, likely the sequela of chronic small vessel ischemic disease. Vascular: No hyperdense vessel. Atherosclerotic calcifications in the intracranial carotid and vertebral arteries. Skull: Negative for fracture or focal lesion. Sinuses/Orbits: Mucosal thickening throughout the paranasal sinuses. No acute finding in the orbits. Other: Fluid throughout the bilateral mastoid air cells. IMPRESSION: No acute intracranial process. Electronically Signed   By: Wiliam Ke M.D.   On: 03/20/2023 03:29   DG CHEST PORT 1 VIEW  Result Date: 03/18/2023 CLINICAL DATA:  Endotracheal intubation EXAM: PORTABLE CHEST 1 VIEW COMPARISON:  03/14/2023 FINDINGS: Interval placement of endotracheal tube, tip above the carina. Enteric feeding tube with tip below the diaphragm, not visualized. Cardiomegaly with diffuse bilateral interstitial pulmonary opacity and probable small layering left  pleural effusion, not significantly changed. Osseous structures unremarkable. IMPRESSION: 1. Interval placement of endotracheal tube, tip above the carina. Enteric feeding tube with tip below the diaphragm, not visualized. 2. Cardiomegaly with diffuse bilateral interstitial pulmonary opacity and probable small layering left pleural effusion, not significantly changed. No new or focal airspace opacity. Electronically Signed   By: Jearld Lesch M.D.   On: 03/18/2023 13:38   DG Swallowing Func-Speech Pathology  Result Date: 03/16/2023 Table formatting from the original result was not included. Modified Barium Swallow Study Patient Details Name: Stephen Flynn MRN: [id number] Date of Birth: 1927/11/04 Today's Date: 03/16/2023 HPI/PMH: HPI: 89 y/o male admitted 8/25 with abdominal pain, SOB with bowel obstruction. 8/26 developed hypotension and required ex-lap with small bowel resection,  Sanford M.D.   On: 04/02/2023 14:46   DG Abd 1 View  Result Date: 03/21/2023 CLINICAL DATA:   Orogastric tube placement. EXAM: ABDOMEN - 1 VIEW COMPARISON:  March 14, 2023. FINDINGS: Distal tip of feeding tube is seen in expected position of distal stomach. IMPRESSION: Distal tip of feeding tube seen in expected position of distal stomach. Electronically Signed   By: Lupita Raider M.D.   On: 03/21/2023 14:57   VAS [place] UPPER EXTREMITY VENOUS DUPLEX  Result Date: 03/20/2023 UPPER VENOUS STUDY  Patient Name:  Stephen Flynn  Date of Exam:   03/20/2023 Medical Rec #: [id number]      Accession #:    [id number] Date of Birth: 27-May-1928       Patient Gender: M Patient Age:   89 years Exam Location:  [facility] Procedure:      VAS [place] UPPER EXTREMITY VENOUS DUPLEX Referring Phys: Zenia Resides --------------------------------------------------------------------------------  Indications: Swelling Risk Factors: None identified. Anticoagulation: Heparin. Limitations: Bandages, patient positioning, patient immobility and line. Comparison Study: No prior studies. Performing Technologist: Chanda Busing RVT  Examination Guidelines: A complete evaluation includes B-mode imaging, spectral Doppler, color Doppler, and power Doppler as needed of all accessible portions of each vessel. Bilateral testing is considered an integral part of a complete examination. Limited examinations for reoccurring indications may be performed as noted.  Right Findings: +----------+------------+---------+-----------+----------+-------+ RIGHT     CompressiblePhasicitySpontaneousPropertiesSummary +----------+------------+---------+-----------+----------+-------+ Subclavian    Full       Yes       Yes                      +----------+------------+---------+-----------+----------+-------+  Left Findings: +----------+------------+---------+-----------+----------+-------+ LEFT      CompressiblePhasicitySpontaneousPropertiesSummary +----------+------------+---------+-----------+----------+-------+ IJV           Full        Yes       Yes                      +----------+------------+---------+-----------+----------+-------+ Subclavian    Full       Yes       Yes                      +----------+------------+---------+-----------+----------+-------+ Axillary      Full       Yes       Yes                      +----------+------------+---------+-----------+----------+-------+ Brachial      Full                                          +----------+------------+---------+-----------+----------+-------+ Radial        Full                                          +----------+------------+---------+-----------+----------+-------+ Ulnar         Full                                          +----------+------------+---------+-----------+----------+-------+ Cephalic      Full                                          +----------+------------+---------+-----------+----------+-------+  Basilic       Full                                          +----------+------------+---------+-----------+----------+-------+  Summary:  Right: No evidence of thrombosis in the subclavian.  Left: No evidence of deep vein thrombosis in the upper extremity. No evidence of superficial vein thrombosis in the upper extremity.  *See table(s) above for measurements and observations.  Diagnosing physician: Waverly Ferrari MD Electronically signed by Waverly Ferrari MD on 03/20/2023 at 4:52:51 PM.    Final    DG Chest Port 1 View  Result Date: 03/20/2023 CLINICAL DATA:  Pneumonia. EXAM: PORTABLE CHEST 1 VIEW COMPARISON:  03/18/2023 FINDINGS: The endotracheal tube tip is 4.2 cm from the carina. Weighted enteric tube tip below the diaphragm not included in the field of view. Right upper extremity/subclavian central line tip overlies the upper SVC. Again seen left lung base opacity no obscuration of hemidiaphragm. Stable heart size and mediastinal contours. Improving interstitial opacities. No pneumothorax  IMPRESSION: 1. Improving interstitial opacities likely improving edema. 2. Unchanged left lung base opacity, favor pleural effusion and atelectasis. 3. Support apparatus unchanged in position. Electronically Signed   By: Narda Rutherford M.D.   On: 03/20/2023 11:09   CT HEAD WO CONTRAST ( )  Result Date: 03/20/2023 CLINICAL DATA:  Anoxic brain injury, 24 hours post TNK EXAM: CT HEAD WITHOUT CONTRAST TECHNIQUE: Contiguous axial images were obtained from the base of the skull through the vertex without intravenous contrast. RADIATION DOSE REDUCTION: This exam was performed according to the departmental dose-optimization program which includes automated exposure control, adjustment of the mA and/or kV according to patient size and/or use of iterative reconstruction technique. COMPARISON:  03/12/2023 CT head FINDINGS: Brain: No evidence of acute infarction, hemorrhage, mass, mass effect, or midline shift. No hydrocephalus or extra-axial fluid collection. Basal ganglia calcifications. Age related cerebral atrophy. Periventricular white matter changes, likely the sequela of chronic small vessel ischemic disease. Vascular: No hyperdense vessel. Atherosclerotic calcifications in the intracranial carotid and vertebral arteries. Skull: Negative for fracture or focal lesion. Sinuses/Orbits: Mucosal thickening throughout the paranasal sinuses. No acute finding in the orbits. Other: Fluid throughout the bilateral mastoid air cells. IMPRESSION: No acute intracranial process. Electronically Signed   By: Wiliam Ke M.D.   On: 03/20/2023 03:29   DG CHEST PORT 1 VIEW  Result Date: 03/18/2023 CLINICAL DATA:  Endotracheal intubation EXAM: PORTABLE CHEST 1 VIEW COMPARISON:  03/14/2023 FINDINGS: Interval placement of endotracheal tube, tip above the carina. Enteric feeding tube with tip below the diaphragm, not visualized. Cardiomegaly with diffuse bilateral interstitial pulmonary opacity and probable small layering left  pleural effusion, not significantly changed. Osseous structures unremarkable. IMPRESSION: 1. Interval placement of endotracheal tube, tip above the carina. Enteric feeding tube with tip below the diaphragm, not visualized. 2. Cardiomegaly with diffuse bilateral interstitial pulmonary opacity and probable small layering left pleural effusion, not significantly changed. No new or focal airspace opacity. Electronically Signed   By: Jearld Lesch M.D.   On: 03/18/2023 13:38   DG Swallowing Func-Speech Pathology  Result Date: 03/16/2023 Table formatting from the original result was not included. Modified Barium Swallow Study Patient Details Name: Stephen Flynn MRN: [id number] Date of Birth: 1927/11/04 Today's Date: 03/16/2023 HPI/PMH: HPI: 89 y/o male admitted 8/25 with abdominal pain, SOB with bowel obstruction. 8/26 developed hypotension and required ex-lap with small bowel resection,  Sanford M.D.   On: 04/02/2023 14:46   DG Abd 1 View  Result Date: 03/21/2023 CLINICAL DATA:   Orogastric tube placement. EXAM: ABDOMEN - 1 VIEW COMPARISON:  March 14, 2023. FINDINGS: Distal tip of feeding tube is seen in expected position of distal stomach. IMPRESSION: Distal tip of feeding tube seen in expected position of distal stomach. Electronically Signed   By: Lupita Raider M.D.   On: 03/21/2023 14:57   VAS [place] UPPER EXTREMITY VENOUS DUPLEX  Result Date: 03/20/2023 UPPER VENOUS STUDY  Patient Name:  Stephen Flynn  Date of Exam:   03/20/2023 Medical Rec #: [id number]      Accession #:    [id number] Date of Birth: 27-May-1928       Patient Gender: M Patient Age:   89 years Exam Location:  [facility] Procedure:      VAS [place] UPPER EXTREMITY VENOUS DUPLEX Referring Phys: Zenia Resides --------------------------------------------------------------------------------  Indications: Swelling Risk Factors: None identified. Anticoagulation: Heparin. Limitations: Bandages, patient positioning, patient immobility and line. Comparison Study: No prior studies. Performing Technologist: Chanda Busing RVT  Examination Guidelines: A complete evaluation includes B-mode imaging, spectral Doppler, color Doppler, and power Doppler as needed of all accessible portions of each vessel. Bilateral testing is considered an integral part of a complete examination. Limited examinations for reoccurring indications may be performed as noted.  Right Findings: +----------+------------+---------+-----------+----------+-------+ RIGHT     CompressiblePhasicitySpontaneousPropertiesSummary +----------+------------+---------+-----------+----------+-------+ Subclavian    Full       Yes       Yes                      +----------+------------+---------+-----------+----------+-------+  Left Findings: +----------+------------+---------+-----------+----------+-------+ LEFT      CompressiblePhasicitySpontaneousPropertiesSummary +----------+------------+---------+-----------+----------+-------+ IJV           Full        Yes       Yes                      +----------+------------+---------+-----------+----------+-------+ Subclavian    Full       Yes       Yes                      +----------+------------+---------+-----------+----------+-------+ Axillary      Full       Yes       Yes                      +----------+------------+---------+-----------+----------+-------+ Brachial      Full                                          +----------+------------+---------+-----------+----------+-------+ Radial        Full                                          +----------+------------+---------+-----------+----------+-------+ Ulnar         Full                                          +----------+------------+---------+-----------+----------+-------+ Cephalic      Full                                          +----------+------------+---------+-----------+----------+-------+  Sanford M.D.   On: 04/02/2023 14:46   DG Abd 1 View  Result Date: 03/21/2023 CLINICAL DATA:   Orogastric tube placement. EXAM: ABDOMEN - 1 VIEW COMPARISON:  March 14, 2023. FINDINGS: Distal tip of feeding tube is seen in expected position of distal stomach. IMPRESSION: Distal tip of feeding tube seen in expected position of distal stomach. Electronically Signed   By: Lupita Raider M.D.   On: 03/21/2023 14:57   VAS [place] UPPER EXTREMITY VENOUS DUPLEX  Result Date: 03/20/2023 UPPER VENOUS STUDY  Patient Name:  Stephen Flynn  Date of Exam:   03/20/2023 Medical Rec #: [id number]      Accession #:    [id number] Date of Birth: 27-May-1928       Patient Gender: M Patient Age:   89 years Exam Location:  [facility] Procedure:      VAS [place] UPPER EXTREMITY VENOUS DUPLEX Referring Phys: Zenia Resides --------------------------------------------------------------------------------  Indications: Swelling Risk Factors: None identified. Anticoagulation: Heparin. Limitations: Bandages, patient positioning, patient immobility and line. Comparison Study: No prior studies. Performing Technologist: Chanda Busing RVT  Examination Guidelines: A complete evaluation includes B-mode imaging, spectral Doppler, color Doppler, and power Doppler as needed of all accessible portions of each vessel. Bilateral testing is considered an integral part of a complete examination. Limited examinations for reoccurring indications may be performed as noted.  Right Findings: +----------+------------+---------+-----------+----------+-------+ RIGHT     CompressiblePhasicitySpontaneousPropertiesSummary +----------+------------+---------+-----------+----------+-------+ Subclavian    Full       Yes       Yes                      +----------+------------+---------+-----------+----------+-------+  Left Findings: +----------+------------+---------+-----------+----------+-------+ LEFT      CompressiblePhasicitySpontaneousPropertiesSummary +----------+------------+---------+-----------+----------+-------+ IJV           Full        Yes       Yes                      +----------+------------+---------+-----------+----------+-------+ Subclavian    Full       Yes       Yes                      +----------+------------+---------+-----------+----------+-------+ Axillary      Full       Yes       Yes                      +----------+------------+---------+-----------+----------+-------+ Brachial      Full                                          +----------+------------+---------+-----------+----------+-------+ Radial        Full                                          +----------+------------+---------+-----------+----------+-------+ Ulnar         Full                                          +----------+------------+---------+-----------+----------+-------+ Cephalic      Full                                          +----------+------------+---------+-----------+----------+-------+  Basilic       Full                                          +----------+------------+---------+-----------+----------+-------+  Summary:  Right: No evidence of thrombosis in the subclavian.  Left: No evidence of deep vein thrombosis in the upper extremity. No evidence of superficial vein thrombosis in the upper extremity.  *See table(s) above for measurements and observations.  Diagnosing physician: Waverly Ferrari MD Electronically signed by Waverly Ferrari MD on 03/20/2023 at 4:52:51 PM.    Final    DG Chest Port 1 View  Result Date: 03/20/2023 CLINICAL DATA:  Pneumonia. EXAM: PORTABLE CHEST 1 VIEW COMPARISON:  03/18/2023 FINDINGS: The endotracheal tube tip is 4.2 cm from the carina. Weighted enteric tube tip below the diaphragm not included in the field of view. Right upper extremity/subclavian central line tip overlies the upper SVC. Again seen left lung base opacity no obscuration of hemidiaphragm. Stable heart size and mediastinal contours. Improving interstitial opacities. No pneumothorax  IMPRESSION: 1. Improving interstitial opacities likely improving edema. 2. Unchanged left lung base opacity, favor pleural effusion and atelectasis. 3. Support apparatus unchanged in position. Electronically Signed   By: Narda Rutherford M.D.   On: 03/20/2023 11:09   CT HEAD WO CONTRAST ( )  Result Date: 03/20/2023 CLINICAL DATA:  Anoxic brain injury, 24 hours post TNK EXAM: CT HEAD WITHOUT CONTRAST TECHNIQUE: Contiguous axial images were obtained from the base of the skull through the vertex without intravenous contrast. RADIATION DOSE REDUCTION: This exam was performed according to the departmental dose-optimization program which includes automated exposure control, adjustment of the mA and/or kV according to patient size and/or use of iterative reconstruction technique. COMPARISON:  03/12/2023 CT head FINDINGS: Brain: No evidence of acute infarction, hemorrhage, mass, mass effect, or midline shift. No hydrocephalus or extra-axial fluid collection. Basal ganglia calcifications. Age related cerebral atrophy. Periventricular white matter changes, likely the sequela of chronic small vessel ischemic disease. Vascular: No hyperdense vessel. Atherosclerotic calcifications in the intracranial carotid and vertebral arteries. Skull: Negative for fracture or focal lesion. Sinuses/Orbits: Mucosal thickening throughout the paranasal sinuses. No acute finding in the orbits. Other: Fluid throughout the bilateral mastoid air cells. IMPRESSION: No acute intracranial process. Electronically Signed   By: Wiliam Ke M.D.   On: 03/20/2023 03:29   DG CHEST PORT 1 VIEW  Result Date: 03/18/2023 CLINICAL DATA:  Endotracheal intubation EXAM: PORTABLE CHEST 1 VIEW COMPARISON:  03/14/2023 FINDINGS: Interval placement of endotracheal tube, tip above the carina. Enteric feeding tube with tip below the diaphragm, not visualized. Cardiomegaly with diffuse bilateral interstitial pulmonary opacity and probable small layering left  pleural effusion, not significantly changed. Osseous structures unremarkable. IMPRESSION: 1. Interval placement of endotracheal tube, tip above the carina. Enteric feeding tube with tip below the diaphragm, not visualized. 2. Cardiomegaly with diffuse bilateral interstitial pulmonary opacity and probable small layering left pleural effusion, not significantly changed. No new or focal airspace opacity. Electronically Signed   By: Jearld Lesch M.D.   On: 03/18/2023 13:38   DG Swallowing Func-Speech Pathology  Result Date: 03/16/2023 Table formatting from the original result was not included. Modified Barium Swallow Study Patient Details Name: Stephen Flynn MRN: [id number] Date of Birth: 1927/11/04 Today's Date: 03/16/2023 HPI/PMH: HPI: 89 y/o male admitted 8/25 with abdominal pain, SOB with bowel obstruction. 8/26 developed hypotension and required ex-lap with small bowel resection,  Basilic       Full                                          +----------+------------+---------+-----------+----------+-------+  Summary:  Right: No evidence of thrombosis in the subclavian.  Left: No evidence of deep vein thrombosis in the upper extremity. No evidence of superficial vein thrombosis in the upper extremity.  *See table(s) above for measurements and observations.  Diagnosing physician: Waverly Ferrari MD Electronically signed by Waverly Ferrari MD on 03/20/2023 at 4:52:51 PM.    Final    DG Chest Port 1 View  Result Date: 03/20/2023 CLINICAL DATA:  Pneumonia. EXAM: PORTABLE CHEST 1 VIEW COMPARISON:  03/18/2023 FINDINGS: The endotracheal tube tip is 4.2 cm from the carina. Weighted enteric tube tip below the diaphragm not included in the field of view. Right upper extremity/subclavian central line tip overlies the upper SVC. Again seen left lung base opacity no obscuration of hemidiaphragm. Stable heart size and mediastinal contours. Improving interstitial opacities. No pneumothorax  IMPRESSION: 1. Improving interstitial opacities likely improving edema. 2. Unchanged left lung base opacity, favor pleural effusion and atelectasis. 3. Support apparatus unchanged in position. Electronically Signed   By: Narda Rutherford M.D.   On: 03/20/2023 11:09   CT HEAD WO CONTRAST ( )  Result Date: 03/20/2023 CLINICAL DATA:  Anoxic brain injury, 24 hours post TNK EXAM: CT HEAD WITHOUT CONTRAST TECHNIQUE: Contiguous axial images were obtained from the base of the skull through the vertex without intravenous contrast. RADIATION DOSE REDUCTION: This exam was performed according to the departmental dose-optimization program which includes automated exposure control, adjustment of the mA and/or kV according to patient size and/or use of iterative reconstruction technique. COMPARISON:  03/12/2023 CT head FINDINGS: Brain: No evidence of acute infarction, hemorrhage, mass, mass effect, or midline shift. No hydrocephalus or extra-axial fluid collection. Basal ganglia calcifications. Age related cerebral atrophy. Periventricular white matter changes, likely the sequela of chronic small vessel ischemic disease. Vascular: No hyperdense vessel. Atherosclerotic calcifications in the intracranial carotid and vertebral arteries. Skull: Negative for fracture or focal lesion. Sinuses/Orbits: Mucosal thickening throughout the paranasal sinuses. No acute finding in the orbits. Other: Fluid throughout the bilateral mastoid air cells. IMPRESSION: No acute intracranial process. Electronically Signed   By: Wiliam Ke M.D.   On: 03/20/2023 03:29   DG CHEST PORT 1 VIEW  Result Date: 03/18/2023 CLINICAL DATA:  Endotracheal intubation EXAM: PORTABLE CHEST 1 VIEW COMPARISON:  03/14/2023 FINDINGS: Interval placement of endotracheal tube, tip above the carina. Enteric feeding tube with tip below the diaphragm, not visualized. Cardiomegaly with diffuse bilateral interstitial pulmonary opacity and probable small layering left  pleural effusion, not significantly changed. Osseous structures unremarkable. IMPRESSION: 1. Interval placement of endotracheal tube, tip above the carina. Enteric feeding tube with tip below the diaphragm, not visualized. 2. Cardiomegaly with diffuse bilateral interstitial pulmonary opacity and probable small layering left pleural effusion, not significantly changed. No new or focal airspace opacity. Electronically Signed   By: Jearld Lesch M.D.   On: 03/18/2023 13:38   DG Swallowing Func-Speech Pathology  Result Date: 03/16/2023 Table formatting from the original result was not included. Modified Barium Swallow Study Patient Details Name: Stephen Flynn MRN: [id number] Date of Birth: 1927/11/04 Today's Date: 03/16/2023 HPI/PMH: HPI: 89 y/o male admitted 8/25 with abdominal pain, SOB with bowel obstruction. 8/26 developed hypotension and required ex-lap with small bowel resection,  Basilic       Full                                          +----------+------------+---------+-----------+----------+-------+  Summary:  Right: No evidence of thrombosis in the subclavian.  Left: No evidence of deep vein thrombosis in the upper extremity. No evidence of superficial vein thrombosis in the upper extremity.  *See table(s) above for measurements and observations.  Diagnosing physician: Waverly Ferrari MD Electronically signed by Waverly Ferrari MD on 03/20/2023 at 4:52:51 PM.    Final    DG Chest Port 1 View  Result Date: 03/20/2023 CLINICAL DATA:  Pneumonia. EXAM: PORTABLE CHEST 1 VIEW COMPARISON:  03/18/2023 FINDINGS: The endotracheal tube tip is 4.2 cm from the carina. Weighted enteric tube tip below the diaphragm not included in the field of view. Right upper extremity/subclavian central line tip overlies the upper SVC. Again seen left lung base opacity no obscuration of hemidiaphragm. Stable heart size and mediastinal contours. Improving interstitial opacities. No pneumothorax  IMPRESSION: 1. Improving interstitial opacities likely improving edema. 2. Unchanged left lung base opacity, favor pleural effusion and atelectasis. 3. Support apparatus unchanged in position. Electronically Signed   By: Narda Rutherford M.D.   On: 03/20/2023 11:09   CT HEAD WO CONTRAST ( )  Result Date: 03/20/2023 CLINICAL DATA:  Anoxic brain injury, 24 hours post TNK EXAM: CT HEAD WITHOUT CONTRAST TECHNIQUE: Contiguous axial images were obtained from the base of the skull through the vertex without intravenous contrast. RADIATION DOSE REDUCTION: This exam was performed according to the departmental dose-optimization program which includes automated exposure control, adjustment of the mA and/or kV according to patient size and/or use of iterative reconstruction technique. COMPARISON:  03/12/2023 CT head FINDINGS: Brain: No evidence of acute infarction, hemorrhage, mass, mass effect, or midline shift. No hydrocephalus or extra-axial fluid collection. Basal ganglia calcifications. Age related cerebral atrophy. Periventricular white matter changes, likely the sequela of chronic small vessel ischemic disease. Vascular: No hyperdense vessel. Atherosclerotic calcifications in the intracranial carotid and vertebral arteries. Skull: Negative for fracture or focal lesion. Sinuses/Orbits: Mucosal thickening throughout the paranasal sinuses. No acute finding in the orbits. Other: Fluid throughout the bilateral mastoid air cells. IMPRESSION: No acute intracranial process. Electronically Signed   By: Wiliam Ke M.D.   On: 03/20/2023 03:29   DG CHEST PORT 1 VIEW  Result Date: 03/18/2023 CLINICAL DATA:  Endotracheal intubation EXAM: PORTABLE CHEST 1 VIEW COMPARISON:  03/14/2023 FINDINGS: Interval placement of endotracheal tube, tip above the carina. Enteric feeding tube with tip below the diaphragm, not visualized. Cardiomegaly with diffuse bilateral interstitial pulmonary opacity and probable small layering left  pleural effusion, not significantly changed. Osseous structures unremarkable. IMPRESSION: 1. Interval placement of endotracheal tube, tip above the carina. Enteric feeding tube with tip below the diaphragm, not visualized. 2. Cardiomegaly with diffuse bilateral interstitial pulmonary opacity and probable small layering left pleural effusion, not significantly changed. No new or focal airspace opacity. Electronically Signed   By: Jearld Lesch M.D.   On: 03/18/2023 13:38   DG Swallowing Func-Speech Pathology  Result Date: 03/16/2023 Table formatting from the original result was not included. Modified Barium Swallow Study Patient Details Name: Stephen Flynn MRN: [id number] Date of Birth: 1927/11/04 Today's Date: 03/16/2023 HPI/PMH: HPI: 89 y/o male admitted 8/25 with abdominal pain, SOB with bowel obstruction. 8/26 developed hypotension and required ex-lap with small bowel resection,  Basilic       Full                                          +----------+------------+---------+-----------+----------+-------+  Summary:  Right: No evidence of thrombosis in the subclavian.  Left: No evidence of deep vein thrombosis in the upper extremity. No evidence of superficial vein thrombosis in the upper extremity.  *See table(s) above for measurements and observations.  Diagnosing physician: Waverly Ferrari MD Electronically signed by Waverly Ferrari MD on 03/20/2023 at 4:52:51 PM.    Final    DG Chest Port 1 View  Result Date: 03/20/2023 CLINICAL DATA:  Pneumonia. EXAM: PORTABLE CHEST 1 VIEW COMPARISON:  03/18/2023 FINDINGS: The endotracheal tube tip is 4.2 cm from the carina. Weighted enteric tube tip below the diaphragm not included in the field of view. Right upper extremity/subclavian central line tip overlies the upper SVC. Again seen left lung base opacity no obscuration of hemidiaphragm. Stable heart size and mediastinal contours. Improving interstitial opacities. No pneumothorax  IMPRESSION: 1. Improving interstitial opacities likely improving edema. 2. Unchanged left lung base opacity, favor pleural effusion and atelectasis. 3. Support apparatus unchanged in position. Electronically Signed   By: Narda Rutherford M.D.   On: 03/20/2023 11:09   CT HEAD WO CONTRAST ( )  Result Date: 03/20/2023 CLINICAL DATA:  Anoxic brain injury, 24 hours post TNK EXAM: CT HEAD WITHOUT CONTRAST TECHNIQUE: Contiguous axial images were obtained from the base of the skull through the vertex without intravenous contrast. RADIATION DOSE REDUCTION: This exam was performed according to the departmental dose-optimization program which includes automated exposure control, adjustment of the mA and/or kV according to patient size and/or use of iterative reconstruction technique. COMPARISON:  03/12/2023 CT head FINDINGS: Brain: No evidence of acute infarction, hemorrhage, mass, mass effect, or midline shift. No hydrocephalus or extra-axial fluid collection. Basal ganglia calcifications. Age related cerebral atrophy. Periventricular white matter changes, likely the sequela of chronic small vessel ischemic disease. Vascular: No hyperdense vessel. Atherosclerotic calcifications in the intracranial carotid and vertebral arteries. Skull: Negative for fracture or focal lesion. Sinuses/Orbits: Mucosal thickening throughout the paranasal sinuses. No acute finding in the orbits. Other: Fluid throughout the bilateral mastoid air cells. IMPRESSION: No acute intracranial process. Electronically Signed   By: Wiliam Ke M.D.   On: 03/20/2023 03:29   DG CHEST PORT 1 VIEW  Result Date: 03/18/2023 CLINICAL DATA:  Endotracheal intubation EXAM: PORTABLE CHEST 1 VIEW COMPARISON:  03/14/2023 FINDINGS: Interval placement of endotracheal tube, tip above the carina. Enteric feeding tube with tip below the diaphragm, not visualized. Cardiomegaly with diffuse bilateral interstitial pulmonary opacity and probable small layering left  pleural effusion, not significantly changed. Osseous structures unremarkable. IMPRESSION: 1. Interval placement of endotracheal tube, tip above the carina. Enteric feeding tube with tip below the diaphragm, not visualized. 2. Cardiomegaly with diffuse bilateral interstitial pulmonary opacity and probable small layering left pleural effusion, not significantly changed. No new or focal airspace opacity. Electronically Signed   By: Jearld Lesch M.D.   On: 03/18/2023 13:38   DG Swallowing Func-Speech Pathology  Result Date: 03/16/2023 Table formatting from the original result was not included. Modified Barium Swallow Study Patient Details Name: Stephen Flynn MRN: [id number] Date of Birth: 1927/11/04 Today's Date: 03/16/2023 HPI/PMH: HPI: 89 y/o male admitted 8/25 with abdominal pain, SOB with bowel obstruction. 8/26 developed hypotension and required ex-lap with small bowel resection,  Sanford M.D.   On: 04/02/2023 14:46   DG Abd 1 View  Result Date: 03/21/2023 CLINICAL DATA:   Orogastric tube placement. EXAM: ABDOMEN - 1 VIEW COMPARISON:  March 14, 2023. FINDINGS: Distal tip of feeding tube is seen in expected position of distal stomach. IMPRESSION: Distal tip of feeding tube seen in expected position of distal stomach. Electronically Signed   By: Lupita Raider M.D.   On: 03/21/2023 14:57   VAS [place] UPPER EXTREMITY VENOUS DUPLEX  Result Date: 03/20/2023 UPPER VENOUS STUDY  Patient Name:  Stephen Flynn  Date of Exam:   03/20/2023 Medical Rec #: [id number]      Accession #:    [id number] Date of Birth: 27-May-1928       Patient Gender: M Patient Age:   89 years Exam Location:  [facility] Procedure:      VAS [place] UPPER EXTREMITY VENOUS DUPLEX Referring Phys: Zenia Resides --------------------------------------------------------------------------------  Indications: Swelling Risk Factors: None identified. Anticoagulation: Heparin. Limitations: Bandages, patient positioning, patient immobility and line. Comparison Study: No prior studies. Performing Technologist: Chanda Busing RVT  Examination Guidelines: A complete evaluation includes B-mode imaging, spectral Doppler, color Doppler, and power Doppler as needed of all accessible portions of each vessel. Bilateral testing is considered an integral part of a complete examination. Limited examinations for reoccurring indications may be performed as noted.  Right Findings: +----------+------------+---------+-----------+----------+-------+ RIGHT     CompressiblePhasicitySpontaneousPropertiesSummary +----------+------------+---------+-----------+----------+-------+ Subclavian    Full       Yes       Yes                      +----------+------------+---------+-----------+----------+-------+  Left Findings: +----------+------------+---------+-----------+----------+-------+ LEFT      CompressiblePhasicitySpontaneousPropertiesSummary +----------+------------+---------+-----------+----------+-------+ IJV           Full        Yes       Yes                      +----------+------------+---------+-----------+----------+-------+ Subclavian    Full       Yes       Yes                      +----------+------------+---------+-----------+----------+-------+ Axillary      Full       Yes       Yes                      +----------+------------+---------+-----------+----------+-------+ Brachial      Full                                          +----------+------------+---------+-----------+----------+-------+ Radial        Full                                          +----------+------------+---------+-----------+----------+-------+ Ulnar         Full                                          +----------+------------+---------+-----------+----------+-------+ Cephalic      Full                                          +----------+------------+---------+-----------+----------+-------+

## 2023-04-03 NOTE — Progress Notes (Addendum)
NAMEBaylen Flynn, MRN:  [id number], DOB:  1927/09/08, LOS: 26 ADMISSION DATE:  2023/03/12, CONSULTATION DATE: 8/26 REFERRING MD: Dr. Randol Kern, CHIEF COMPLAINT: Hypotension  History of Present Illness:  Patient is encephalopathic and/or intubated; therefore, history has been obtained from chart review.  89 year old male with past medical history as below, which is significant for atrial fibrillation on Eliquis, wheelchair-bound, HFpEF, and stroke who presented to [facility] emergency department on 03/12/23 with complaints of acute onset abdominal pain with no bowel movement for 3 days prior to presentation.  Also sounds like he had not been consistently taking his Eliquis.  Upon arrival to the emergency department the patient was noted to be in atrial fibrillation with RVR to the 120s.  Started on diltiazem infusion and placed on heparin drip.  CT of the abdomen and pelvis demonstrated small bowel obstruction and moderate ascites.  General surgery was consulted and recommended conservative management with NG tube decompression.  He was admitted to the hospitalist service.  In the a.m. hours of 8/26 the patient developed waning mental status and became nonverbal.  He also developed hypotension with systolic pressures in the 70s and desaturation to the 80s.  His abdomen became more firm and rigid and exquisitely tender to pain.  The surgical team was at bedside and determined the patient would likely require urgent surgery.  PCCM was consulted for hypotension and hypoxia.  Pertinent  Medical History   has a past medical history of Anemia, Aortic insufficiency, Chest pain, H/O: GI bleed, Hyperlipidemia, Hypertension, Leg cramps, and Stroke (HCC).   Significant Hospital Events: Including procedures, antibiotic start and stop dates in addition to other pertinent events   March 12, 2023 admit for SBO 8/26 acute abdomen, tx to ICU for pressors. PICC line placed. Underwent Ex lap and small bowel  resection 8/27 Extubated 9/1 Reintubated > bronchoscopy, BAL >> few yeast 9/5 1u pRBC for Hgb 6.6 without signs of bleeding   9/6 extubated  9/10 transferred out of ICU and to [facility]  9/15 bradycardic to PEA arrest ~ down time, pupils 8 and fixed post arrest 9/16 awake, tolerating higher levels of pressure support.  Following commands with both extremities he is profoundly debilitated, discussed concern about deconditioning with his daughter and wife recommended DNI postextubation following optimization of volume status 9/17 a little more awake, still profoundly weak, SBT numbers acceptable however still massively volume overloaded, holding off on extubation for another 24 hours to promote volume removal 9/19: More fatigued, increased work of breathing, had to go back on ventilator rate.  Bedside discussion by Dr. Tonia Brooms, family in agreement that they would like to proceed with one-way extubation and comfort measures, awaiting 2 sons to arrive  Interim History / Subjective:  Sedated on fentanyl, appears comfortable  Objective   Blood pressure 129/74, pulse 61, temperature 98.3 F (36.8 C), temperature source Axillary, resp. rate 17, height 5' 9.02" (1.753 m), weight 105.6 kg, SpO2 100%. CVP:  [9 mmHg-23 mmHg] 15 mmHg    Vent Mode: CPAP;PSV FiO2 (%):  [40 %] 40 % Set Rate:  [18 bmp] 18 bmp Vt Set:  [560 mL] 560 mL PEEP:  [5 cmH20] 5 cmH20 Pressure Support:  [10 cmH20] 10 cmH20 Plateau Pressure:  [17 cmH20-20 cmH20] 18 cmH20   Intake/Output Summary (Last 24 hours) at 03/27/2023 0816 Last data filed at 03/14/2023 0600 Gross per 24 hour  Intake 886.22 ml  Output 1865 ml  Net -978.78 ml   [employer]  03/21/23 0500 03/22/23 0220 04/01/2023 0402  Weight: 107.2 kg 105.7 kg 105.6 kg    Examination: General This is a 89 year old male patient who remains on mechanical ventilation status post cardiac arrest he is profoundly weak and deconditioned as well as malnourished HEENT temporal  wasting noted, orally intubated Pulmonary: Diminished bases, currently on pressure support of 10 PEEP 5, tidal volumes in the 400 range no accessory use on this level of support Cardiac regular rate and rhythm Abdomen soft, Tolerating tube feeds. Extremities ongoing diffuse anasarca Neuro awakens to stimulus, profound and generalized weakness, intermittently follows commands  Resolved Hospital Problem list   AKI Hypernatremia  In hospital bradycardic to PEA arrest. (15 minute down time) Assessment & Plan:   HFrEF (40-45% w/ global hypokinesis now s/p in house cardiac arrest 9/15 w/drug related hypotension Plan: Continue telemetry monitoring  Repeating Lasix with hope to optimize volume status & limit work of breathing postextubation  not a candidate for goal-directed medical therapy  No re-escalation of care   Paroxysmal atrial fibrillation  Plan: Continue telemetry monitoring Will discontinue heparin, we are transitioning to comfort care  Acute Hypoxic Respiratory Failure due to cardiopulmonary arrest c/b Recurrent aspiration pneumonia  MRSA PCR was negative Sputum still reincubating max temp 98.6 over the last 24 hours Plan: Continuing to cycle on pressure support with plan for one-way extubation and comfort care when his sons arrive VAP bundle RASS goal -1  Small bowel obstruction s/p ex lap and small bowel resection 03-24-23 Plan: Continue wound VAC Holding off on diet  Acute metabolic encephalopathy w/ Concern for anoxic brain injury s/p cardiac arrest Following commands but profoundly weak and debilitated.  CT head negative for acute injury Plan: Comfort care   LUE swelling DVT ruled out  Plan: Keep extremities elevated  Mild AKI s/p cardiac arrest Total body volume overload Tolerating active diuresis with albumin Lasix combo Plan: lasix today Dc further labs   Anemia, due to critical illness superimposed on iron deficiency anemia  Hgb trending down some,  but no obvious bleeding Plan: Dc labs  Diabetes mellitus, controlled Hemoglobin A1c 6.5, better glycemic control following changed to moderate sliding scale Plan: Dc ssi Starting d5 Once glucose stable dc cbgs  Moderate malnutrition Baseline debility: Wheelchair-bound Plan: TBD but looks like we will be transitioning to comfort so hold off on tubefeeds   Best Practice (right click and "Reselect all SmartList Selections" daily)   Diet/type: NPO  DVT prophylaxis: Systemic heparin GI prophylaxis: PPI Lines: N/A Foley:  Yes, and it is still needed Code Status:  DNR Last date of multidisciplinary goals of care discussion 9/19 plan for 1 way extubation   My time 37 minutes   Simonne Martinet ACNP-BC [facility] Pulmonary/Critical Care Pager # [phone] OR # [phone] if no answer

## 2023-04-03 NOTE — Progress Notes (Signed)
03/05/2023 1704  Spiritual Encounters  Type of Visit Initial  Care provided to: [facility] present during encounter Nurse  Referral source Nurse (RN/NT/LPN)  Reason for visit End-of-life  OnCall Visit No   Provided prayer and spiritual care to the family.

## 2023-04-03 NOTE — Progress Notes (Signed)
PT Cancellation Note  Patient Details Name: Stephen Flynn MRN: [id number] DOB: [birth year]   Cancelled Treatment:    Reason Eval/Treat Not Completed: Other (comment) (progressing to 1-way extubation/comfort) 03/10/2023  Jacinto Halim., PT Acute Rehabilitation Services [phone]  (office)  Eliseo Gum Clarene Curran 03/09/2023, 9:53 AM

## 2023-04-03 DEATH — deceased

## 2023-04-04 ENCOUNTER — Ambulatory Visit: Payer: 59 | Admitting: Podiatry

## 2023-04-30 MED FILL — Medication: Qty: 1 | Status: AC
